# Patient Record
Sex: Female | Born: 1945 | Race: White | Hispanic: No | Marital: Married | Smoking: Never smoker
Health system: Southern US, Community
[De-identification: ages and names within clinical notes are randomized; demographics above are authoritative.]

## PROBLEM LIST (undated history)

## (undated) DIAGNOSIS — I10 Essential (primary) hypertension: Secondary | ICD-10-CM

## (undated) DIAGNOSIS — C9110 Chronic lymphocytic leukemia of B-cell type not having achieved remission: Secondary | ICD-10-CM

## (undated) DIAGNOSIS — G4730 Sleep apnea, unspecified: Secondary | ICD-10-CM

## (undated) DIAGNOSIS — E785 Hyperlipidemia, unspecified: Secondary | ICD-10-CM

## (undated) DIAGNOSIS — K8020 Calculus of gallbladder without cholecystitis without obstruction: Secondary | ICD-10-CM

## (undated) DIAGNOSIS — E669 Obesity, unspecified: Secondary | ICD-10-CM

## (undated) DIAGNOSIS — H269 Unspecified cataract: Secondary | ICD-10-CM

## (undated) DIAGNOSIS — IMO0001 Reserved for inherently not codable concepts without codable children: Secondary | ICD-10-CM

## (undated) DIAGNOSIS — H5461 Unqualified visual loss, right eye, normal vision left eye: Secondary | ICD-10-CM

## (undated) DIAGNOSIS — J329 Chronic sinusitis, unspecified: Secondary | ICD-10-CM

## (undated) DIAGNOSIS — D8689 Sarcoidosis of other sites: Secondary | ICD-10-CM

## (undated) DIAGNOSIS — M1990 Unspecified osteoarthritis, unspecified site: Secondary | ICD-10-CM

## (undated) DIAGNOSIS — T7840XA Allergy, unspecified, initial encounter: Secondary | ICD-10-CM

## (undated) DIAGNOSIS — K589 Irritable bowel syndrome without diarrhea: Secondary | ICD-10-CM

## (undated) DIAGNOSIS — H9192 Unspecified hearing loss, left ear: Secondary | ICD-10-CM

## (undated) HISTORY — PX: CHOLECYSTECTOMY: SHX55

## (undated) HISTORY — DX: Sarcoidosis of other sites: D86.89

## (undated) HISTORY — DX: Unqualified visual loss, right eye, normal vision left eye: H54.61

## (undated) HISTORY — DX: Allergy, unspecified, initial encounter: T78.40XA

## (undated) HISTORY — DX: Calculus of gallbladder without cholecystitis without obstruction: K80.20

## (undated) HISTORY — DX: Unspecified osteoarthritis, unspecified site: M19.90

## (undated) HISTORY — DX: Sleep apnea, unspecified: G47.30

## (undated) HISTORY — DX: Irritable bowel syndrome, unspecified: K58.9

## (undated) HISTORY — PX: BRAIN BIOPSY: SHX905

## (undated) HISTORY — DX: Essential (primary) hypertension: I10

## (undated) HISTORY — DX: Reserved for inherently not codable concepts without codable children: IMO0001

## (undated) HISTORY — DX: Unspecified hearing loss, left ear: H91.92

## (undated) HISTORY — PX: TONSILLECTOMY: SHX5217

## (undated) HISTORY — PX: CERVICAL FUSION: SHX112

## (undated) HISTORY — DX: Hyperlipidemia, unspecified: E78.5

## (undated) HISTORY — DX: Unspecified cataract: H26.9

## (undated) HISTORY — DX: Chronic sinusitis, unspecified: J32.9

## (undated) HISTORY — PX: COLONOSCOPY: SHX174

## (undated) HISTORY — DX: Obesity, unspecified: E66.9

## (undated) HISTORY — DX: Chronic lymphocytic leukemia of B-cell type not having achieved remission: C91.10

---

## 1999-09-22 ENCOUNTER — Encounter: Payer: Self-pay | Admitting: Pediatrics

## 1999-09-22 ENCOUNTER — Ambulatory Visit (HOSPITAL_COMMUNITY): Admission: RE | Admit: 1999-09-22 | Discharge: 1999-09-22 | Payer: Self-pay | Admitting: Pediatrics

## 2000-07-26 ENCOUNTER — Other Ambulatory Visit: Admission: RE | Admit: 2000-07-26 | Discharge: 2000-07-26 | Payer: Self-pay | Admitting: Internal Medicine

## 2000-07-31 ENCOUNTER — Encounter: Payer: Self-pay | Admitting: Internal Medicine

## 2000-07-31 ENCOUNTER — Encounter: Admission: RE | Admit: 2000-07-31 | Discharge: 2000-07-31 | Payer: Self-pay | Admitting: Internal Medicine

## 2002-01-15 ENCOUNTER — Other Ambulatory Visit: Admission: RE | Admit: 2002-01-15 | Discharge: 2002-01-15 | Payer: Self-pay | Admitting: Internal Medicine

## 2002-02-05 ENCOUNTER — Other Ambulatory Visit: Admission: RE | Admit: 2002-02-05 | Discharge: 2002-02-05 | Payer: Self-pay | Admitting: Oncology

## 2004-05-02 ENCOUNTER — Ambulatory Visit: Payer: Self-pay | Admitting: Oncology

## 2004-06-27 LAB — HM COLONOSCOPY

## 2004-08-24 ENCOUNTER — Ambulatory Visit: Payer: Self-pay | Admitting: Internal Medicine

## 2004-11-04 ENCOUNTER — Emergency Department (HOSPITAL_COMMUNITY): Admission: EM | Admit: 2004-11-04 | Discharge: 2004-11-05 | Payer: Self-pay | Admitting: Emergency Medicine

## 2004-11-09 ENCOUNTER — Ambulatory Visit: Payer: Self-pay | Admitting: Internal Medicine

## 2004-11-09 ENCOUNTER — Ambulatory Visit: Payer: Self-pay | Admitting: Oncology

## 2004-11-29 ENCOUNTER — Ambulatory Visit: Payer: Self-pay | Admitting: Internal Medicine

## 2004-12-08 ENCOUNTER — Encounter: Payer: Self-pay | Admitting: Internal Medicine

## 2004-12-08 ENCOUNTER — Ambulatory Visit: Payer: Self-pay | Admitting: Internal Medicine

## 2004-12-23 ENCOUNTER — Ambulatory Visit: Payer: Self-pay | Admitting: Internal Medicine

## 2005-05-18 ENCOUNTER — Ambulatory Visit: Payer: Self-pay | Admitting: Oncology

## 2005-06-06 ENCOUNTER — Ambulatory Visit: Payer: Self-pay | Admitting: Internal Medicine

## 2005-09-06 ENCOUNTER — Ambulatory Visit: Payer: Self-pay | Admitting: Internal Medicine

## 2005-11-03 ENCOUNTER — Ambulatory Visit: Payer: Self-pay | Admitting: Internal Medicine

## 2005-11-20 ENCOUNTER — Ambulatory Visit: Payer: Self-pay | Admitting: Oncology

## 2005-11-25 LAB — CBC WITH DIFFERENTIAL/PLATELET
BASO%: 0.2 % (ref 0.0–2.0)
Basophils Absolute: 0.1 10*3/uL (ref 0.0–0.1)
EOS%: 1 % (ref 0.0–7.0)
Eosinophils Absolute: 0.3 10*3/uL (ref 0.0–0.5)
HCT: 41.5 % (ref 34.8–46.6)
HGB: 14 g/dL (ref 11.6–15.9)
LYMPH%: 73 % — ABNORMAL HIGH (ref 14.0–48.0)
MCH: 28.9 pg (ref 26.0–34.0)
MCHC: 33.7 g/dL (ref 32.0–36.0)
MCV: 85.7 fL (ref 81.0–101.0)
MONO#: 0.7 10*3/uL (ref 0.1–0.9)
MONO%: 2.7 % (ref 0.0–13.0)
NEUT#: 6.3 10*3/uL (ref 1.5–6.5)
NEUT%: 23.1 % — ABNORMAL LOW (ref 39.6–76.8)
Platelets: 202 10*3/uL (ref 145–400)
RBC: 4.85 10*6/uL (ref 3.70–5.32)
RDW: 13.9 % (ref 11.3–14.5)
WBC: 27.3 10*3/uL — ABNORMAL HIGH (ref 3.9–10.0)
lymph#: 19.9 10*3/uL — ABNORMAL HIGH (ref 0.9–3.3)

## 2005-11-25 LAB — ERYTHROCYTE SEDIMENTATION RATE: Sed Rate: 13 mm/hr (ref 0–30)

## 2005-11-28 LAB — COMPREHENSIVE METABOLIC PANEL
ALT: 22 U/L (ref 0–40)
AST: 18 U/L (ref 0–37)
Albumin: 4.6 g/dL (ref 3.5–5.2)
Alkaline Phosphatase: 128 U/L — ABNORMAL HIGH (ref 39–117)
BUN: 19 mg/dL (ref 6–23)
CO2: 29 mEq/L (ref 19–32)
Calcium: 10 mg/dL (ref 8.4–10.5)
Chloride: 102 mEq/L (ref 96–112)
Creatinine, Ser: 0.88 mg/dL (ref 0.40–1.20)
Glucose, Bld: 94 mg/dL (ref 70–99)
Potassium: 4.7 mEq/L (ref 3.5–5.3)
Sodium: 141 mEq/L (ref 135–145)
Total Bilirubin: 0.4 mg/dL (ref 0.3–1.2)
Total Protein: 6.8 g/dL (ref 6.0–8.3)

## 2005-11-28 LAB — IGG, IGA, IGM
IgA: 116 mg/dL (ref 68–378)
IgG (Immunoglobin G), Serum: 496 mg/dL — ABNORMAL LOW (ref 694–1618)
IgM, Serum: 70 mg/dL (ref 60–263)

## 2005-11-28 LAB — LACTATE DEHYDROGENASE: LDH: 193 U/L (ref 94–250)

## 2005-11-28 LAB — BETA 2 MICROGLOBULIN, SERUM: Beta-2 Microglobulin: 1.77 mg/L — ABNORMAL HIGH (ref 1.01–1.73)

## 2005-12-07 ENCOUNTER — Ambulatory Visit: Payer: Self-pay | Admitting: Internal Medicine

## 2006-04-19 ENCOUNTER — Ambulatory Visit: Payer: Self-pay | Admitting: Internal Medicine

## 2006-05-19 ENCOUNTER — Ambulatory Visit: Payer: Self-pay | Admitting: Oncology

## 2006-05-24 LAB — LACTATE DEHYDROGENASE: LDH: 195 U/L (ref 94–250)

## 2006-05-24 LAB — CBC WITH DIFFERENTIAL/PLATELET
BASO%: 0.2 % (ref 0.0–2.0)
Basophils Absolute: 0 10*3/uL (ref 0.0–0.1)
EOS%: 1.5 % (ref 0.0–7.0)
Eosinophils Absolute: 0.4 10*3/uL (ref 0.0–0.5)
HCT: 41.4 % (ref 34.8–46.6)
HGB: 13.9 g/dL (ref 11.6–15.9)
LYMPH%: 74.4 % — ABNORMAL HIGH (ref 14.0–48.0)
MCH: 28.9 pg (ref 26.0–34.0)
MCHC: 33.5 g/dL (ref 32.0–36.0)
MCV: 86.3 fL (ref 81.0–101.0)
MONO#: 0.8 10*3/uL (ref 0.1–0.9)
MONO%: 2.7 % (ref 0.0–13.0)
NEUT#: 6.3 10*3/uL (ref 1.5–6.5)
NEUT%: 21.2 % — ABNORMAL LOW (ref 39.6–76.8)
Platelets: 270 10*3/uL (ref 145–400)
RBC: 4.79 10*6/uL (ref 3.70–5.32)
RDW: 14.2 % (ref 11.3–14.5)
WBC: 29.5 10*3/uL — ABNORMAL HIGH (ref 3.9–10.0)
lymph#: 22 10*3/uL — ABNORMAL HIGH (ref 0.9–3.3)

## 2006-05-24 LAB — COMPREHENSIVE METABOLIC PANEL
ALT: 25 U/L (ref 0–35)
AST: 17 U/L (ref 0–37)
Albumin: 4.3 g/dL (ref 3.5–5.2)
Alkaline Phosphatase: 125 U/L — ABNORMAL HIGH (ref 39–117)
BUN: 28 mg/dL — ABNORMAL HIGH (ref 6–23)
CO2: 26 mEq/L (ref 19–32)
Calcium: 9.4 mg/dL (ref 8.4–10.5)
Chloride: 103 mEq/L (ref 96–112)
Creatinine, Ser: 0.89 mg/dL (ref 0.40–1.20)
Glucose, Bld: 100 mg/dL — ABNORMAL HIGH (ref 70–99)
Potassium: 4.3 mEq/L (ref 3.5–5.3)
Sodium: 140 mEq/L (ref 135–145)
Total Bilirubin: 0.3 mg/dL (ref 0.3–1.2)
Total Protein: 6.8 g/dL (ref 6.0–8.3)

## 2006-05-24 LAB — IGG, IGA, IGM
IgA: 114 mg/dL (ref 68–378)
IgG (Immunoglobin G), Serum: 497 mg/dL — ABNORMAL LOW (ref 694–1618)
IgM, Serum: 63 mg/dL (ref 60–263)

## 2006-05-29 ENCOUNTER — Ambulatory Visit: Payer: Self-pay | Admitting: Internal Medicine

## 2006-05-29 LAB — CONVERTED CEMR LAB
ALT: 27 units/L (ref 0–40)
AST: 25 units/L (ref 0–37)
Albumin: 4.2 g/dL (ref 3.5–5.2)
Alkaline Phosphatase: 109 units/L (ref 39–117)
BUN: 23 mg/dL (ref 6–23)
Basophils Absolute: 0 10*3/uL (ref 0.0–0.1)
Basophils Relative: 0 % (ref 0.0–1.0)
CO2: 30 meq/L (ref 19–32)
Calcium: 9.7 mg/dL (ref 8.4–10.5)
Chloride: 105 meq/L (ref 96–112)
Chol/HDL Ratio, serum: 3.8
Cholesterol: 202 mg/dL (ref 0–200)
Creatinine, Ser: 1 mg/dL (ref 0.4–1.2)
Eosinophil percent: 0.7 % (ref 0.0–5.0)
GFR calc non Af Amer: 61 mL/min
Glomerular Filtration Rate, Af Am: 73 mL/min/{1.73_m2}
Glucose, Bld: 88 mg/dL (ref 70–99)
HCT: 42.8 % (ref 36.0–46.0)
HDL: 53.7 mg/dL (ref >39.0)
Hemoglobin: 14.3 g/dL (ref 12.0–15.0)
LDL DIRECT: 133 mg/dL
Lymphocytes Relative: 73.2 % — ABNORMAL HIGH (ref 12.0–46.0)
MCHC: 33.4 g/dL (ref 30.0–36.0)
MCV: 87.7 fL (ref 78.0–100.0)
Monocytes Absolute: 0.8 10*3/uL — ABNORMAL HIGH (ref 0.2–0.7)
Monocytes Relative: 3 % (ref 3.0–11.0)
Neutro Abs: 6.3 10*3/uL (ref 1.4–7.7)
Neutrophils Relative %: 23.1 % — ABNORMAL LOW (ref 43.0–77.0)
Platelets: 225 10*3/uL (ref 150–400)
Potassium: 3.8 meq/L (ref 3.5–5.1)
RBC: 4.89 M/uL (ref 3.87–5.11)
RDW: 13.6 % (ref 11.5–14.6)
Sodium: 142 meq/L (ref 135–145)
TSH: 1.57 microintl units/mL (ref 0.35–5.50)
Total Bilirubin: 0.6 mg/dL (ref 0.3–1.2)
Total Protein: 6.5 g/dL (ref 6.0–8.3)
Triglyceride fasting, serum: 135 mg/dL (ref 0–149)
VLDL: 27 mg/dL (ref 0–40)
WBC: 27.4 10*3/uL (ref 4.5–10.5)

## 2006-06-07 ENCOUNTER — Ambulatory Visit: Payer: Self-pay | Admitting: Internal Medicine

## 2006-10-02 ENCOUNTER — Encounter: Admission: RE | Admit: 2006-10-02 | Discharge: 2006-10-02 | Payer: Self-pay | Admitting: Internal Medicine

## 2006-10-02 ENCOUNTER — Encounter: Payer: Self-pay | Admitting: Internal Medicine

## 2006-11-21 ENCOUNTER — Ambulatory Visit: Payer: Self-pay | Admitting: Oncology

## 2006-11-23 LAB — CBC WITH DIFFERENTIAL/PLATELET
BASO%: 0.3 % (ref 0.0–2.0)
Basophils Absolute: 0.1 10*3/uL (ref 0.0–0.1)
EOS%: 0.5 % (ref 0.0–7.0)
Eosinophils Absolute: 0.2 10*3/uL (ref 0.0–0.5)
HCT: 41 % (ref 34.8–46.6)
HGB: 13.9 g/dL (ref 11.6–15.9)
LYMPH%: 76.8 % — ABNORMAL HIGH (ref 14.0–48.0)
MCH: 28.9 pg (ref 26.0–34.0)
MCHC: 34 g/dL (ref 32.0–36.0)
MCV: 84.9 fL (ref 81.0–101.0)
MONO#: 0.7 10*3/uL (ref 0.1–0.9)
MONO%: 2.2 % (ref 0.0–13.0)
NEUT#: 6.3 10*3/uL (ref 1.5–6.5)
NEUT%: 20.2 % — ABNORMAL LOW (ref 39.6–76.8)
Platelets: 252 10*3/uL (ref 145–400)
RBC: 4.83 10*6/uL (ref 3.70–5.32)
RDW: 13.8 % (ref 11.3–14.5)
WBC: 31.4 10*3/uL — ABNORMAL HIGH (ref 3.9–10.0)
lymph#: 24.1 10*3/uL — ABNORMAL HIGH (ref 0.9–3.3)

## 2006-11-23 LAB — ERYTHROCYTE SEDIMENTATION RATE: Sed Rate: 16 mm/hr (ref 0–30)

## 2006-11-27 LAB — PROTEIN ELECTROPHORESIS, SERUM
Albumin ELP: 60.1 % (ref 55.8–66.1)
Alpha-1-Globulin: 5.2 % — ABNORMAL HIGH (ref 2.9–4.9)
Alpha-2-Globulin: 14.9 % — ABNORMAL HIGH (ref 7.1–11.8)
Beta 2: 4.7 % (ref 3.2–6.5)
Beta Globulin: 6.8 % (ref 4.7–7.2)
Gamma Globulin: 8.3 % — ABNORMAL LOW (ref 11.1–18.8)
Total Protein, Serum Electrophoresis: 7.1 g/dL (ref 6.0–8.3)

## 2006-11-27 LAB — COMPREHENSIVE METABOLIC PANEL
ALT: 26 U/L (ref 0–35)
AST: 19 U/L (ref 0–37)
Albumin: 4.5 g/dL (ref 3.5–5.2)
Alkaline Phosphatase: 133 U/L — ABNORMAL HIGH (ref 39–117)
BUN: 23 mg/dL (ref 6–23)
CO2: 27 mEq/L (ref 19–32)
Calcium: 9.9 mg/dL (ref 8.4–10.5)
Chloride: 103 mEq/L (ref 96–112)
Creatinine, Ser: 0.88 mg/dL (ref 0.40–1.20)
Glucose, Bld: 99 mg/dL (ref 70–99)
Potassium: 4.2 mEq/L (ref 3.5–5.3)
Sodium: 143 mEq/L (ref 135–145)
Total Bilirubin: 0.4 mg/dL (ref 0.3–1.2)
Total Protein: 7.1 g/dL (ref 6.0–8.3)

## 2006-11-27 LAB — LACTATE DEHYDROGENASE: LDH: 226 U/L (ref 94–250)

## 2006-11-27 LAB — BETA 2 MICROGLOBULIN, SERUM: Beta-2 Microglobulin: 1.54 mg/L (ref 1.01–1.73)

## 2006-12-12 ENCOUNTER — Ambulatory Visit: Payer: Self-pay | Admitting: Internal Medicine

## 2006-12-14 ENCOUNTER — Encounter: Payer: Self-pay | Admitting: Internal Medicine

## 2006-12-14 DIAGNOSIS — E785 Hyperlipidemia, unspecified: Secondary | ICD-10-CM

## 2006-12-14 DIAGNOSIS — E669 Obesity, unspecified: Secondary | ICD-10-CM

## 2006-12-14 DIAGNOSIS — C9110 Chronic lymphocytic leukemia of B-cell type not having achieved remission: Secondary | ICD-10-CM

## 2006-12-14 DIAGNOSIS — M1990 Unspecified osteoarthritis, unspecified site: Secondary | ICD-10-CM

## 2006-12-14 DIAGNOSIS — I10 Essential (primary) hypertension: Secondary | ICD-10-CM | POA: Insufficient documentation

## 2006-12-14 DIAGNOSIS — IMO0001 Reserved for inherently not codable concepts without codable children: Secondary | ICD-10-CM | POA: Insufficient documentation

## 2006-12-14 HISTORY — DX: Reserved for inherently not codable concepts without codable children: IMO0001

## 2006-12-14 HISTORY — DX: Essential (primary) hypertension: I10

## 2006-12-14 HISTORY — DX: Obesity, unspecified: E66.9

## 2006-12-14 HISTORY — DX: Hyperlipidemia, unspecified: E78.5

## 2006-12-14 HISTORY — DX: Unspecified osteoarthritis, unspecified site: M19.90

## 2006-12-14 HISTORY — DX: Chronic lymphocytic leukemia of B-cell type not having achieved remission: C91.10

## 2007-05-29 ENCOUNTER — Encounter: Payer: Self-pay | Admitting: Internal Medicine

## 2007-06-01 ENCOUNTER — Encounter: Payer: Self-pay | Admitting: Internal Medicine

## 2007-06-12 ENCOUNTER — Ambulatory Visit: Payer: Self-pay | Admitting: Internal Medicine

## 2007-06-19 ENCOUNTER — Ambulatory Visit: Payer: Self-pay | Admitting: Oncology

## 2007-06-25 LAB — CBC & DIFF AND RETIC
BASO%: 0.3 % (ref 0.0–2.0)
Basophils Absolute: 0.1 10*3/uL (ref 0.0–0.1)
EOS%: 0.7 % (ref 0.0–7.0)
Eosinophils Absolute: 0.2 10*3/uL (ref 0.0–0.5)
HCT: 41 % (ref 34.8–46.6)
HGB: 13.9 g/dL (ref 11.6–15.9)
IRF: 0.26 (ref 0.130–0.330)
LYMPH%: 69.7 % — ABNORMAL HIGH (ref 14.0–48.0)
MCH: 28.5 pg (ref 26.0–34.0)
MCHC: 33.8 g/dL (ref 32.0–36.0)
MCV: 84.4 fL (ref 81.0–101.0)
MONO#: 0.9 10*3/uL (ref 0.1–0.9)
MONO%: 3 % (ref 0.0–13.0)
NEUT#: 7.4 10*3/uL — ABNORMAL HIGH (ref 1.5–6.5)
NEUT%: 26.3 % — ABNORMAL LOW (ref 39.6–76.8)
Platelets: 247 10*3/uL (ref 145–400)
RBC: 4.86 10*6/uL (ref 3.70–5.32)
RDW: 14.2 % (ref 11.3–14.5)
RETIC #: 51.5 10*3/uL (ref 19.7–115.1)
Retic %: 1.1 % (ref 0.4–2.3)
WBC: 28.4 10*3/uL — ABNORMAL HIGH (ref 3.9–10.0)
lymph#: 19.8 10*3/uL — ABNORMAL HIGH (ref 0.9–3.3)

## 2007-06-25 LAB — MORPHOLOGY: PLT EST: ADEQUATE

## 2007-06-25 LAB — CHCC SMEAR

## 2007-06-26 LAB — IGG, IGA, IGM
IgA: 116 mg/dL (ref 68–378)
IgG (Immunoglobin G), Serum: 522 mg/dL — ABNORMAL LOW (ref 694–1618)
IgM, Serum: 62 mg/dL (ref 60–263)

## 2007-06-26 LAB — COMPREHENSIVE METABOLIC PANEL
ALT: 18 U/L (ref 0–35)
AST: 18 U/L (ref 0–37)
Albumin: 4.6 g/dL (ref 3.5–5.2)
Alkaline Phosphatase: 133 U/L — ABNORMAL HIGH (ref 39–117)
BUN: 25 mg/dL — ABNORMAL HIGH (ref 6–23)
CO2: 25 mEq/L (ref 19–32)
Calcium: 10.3 mg/dL (ref 8.4–10.5)
Chloride: 102 mEq/L (ref 96–112)
Creatinine, Ser: 0.93 mg/dL (ref 0.40–1.20)
Glucose, Bld: 97 mg/dL (ref 70–99)
Potassium: 4.3 mEq/L (ref 3.5–5.3)
Sodium: 142 mEq/L (ref 135–145)
Total Bilirubin: 0.5 mg/dL (ref 0.3–1.2)
Total Protein: 7.1 g/dL (ref 6.0–8.3)

## 2007-06-26 LAB — BETA 2 MICROGLOBULIN, SERUM: Beta-2 Microglobulin: 1.98 mg/L — ABNORMAL HIGH (ref 1.01–1.73)

## 2007-06-26 LAB — LACTATE DEHYDROGENASE: LDH: 216 U/L (ref 94–250)

## 2007-06-29 ENCOUNTER — Ambulatory Visit: Payer: Self-pay | Admitting: Oncology

## 2007-06-29 ENCOUNTER — Encounter: Payer: Self-pay | Admitting: Internal Medicine

## 2007-08-28 ENCOUNTER — Encounter: Payer: Self-pay | Admitting: Internal Medicine

## 2007-10-01 ENCOUNTER — Telehealth: Payer: Self-pay | Admitting: Internal Medicine

## 2007-10-01 ENCOUNTER — Encounter: Payer: Self-pay | Admitting: Internal Medicine

## 2007-12-03 ENCOUNTER — Telehealth: Payer: Self-pay | Admitting: Internal Medicine

## 2007-12-10 ENCOUNTER — Ambulatory Visit: Payer: Self-pay | Admitting: Internal Medicine

## 2007-12-10 LAB — CONVERTED CEMR LAB
ALT: 31 units/L (ref 0–35)
AST: 27 units/L (ref 0–37)
Albumin: 4.1 g/dL (ref 3.5–5.2)
Alkaline Phosphatase: 104 units/L (ref 39–117)
BUN: 26 mg/dL — ABNORMAL HIGH (ref 6–23)
Basophils Absolute: 0.1 10*3/uL (ref 0.0–0.1)
Basophils Relative: 0.5 % (ref 0.0–1.0)
Bilirubin, Direct: 0.1 mg/dL (ref 0.0–0.3)
CO2: 29 meq/L (ref 19–32)
Calcium: 9.4 mg/dL (ref 8.4–10.5)
Chloride: 104 meq/L (ref 96–112)
Cholesterol: 213 mg/dL (ref 0–200)
Creatinine, Ser: 0.9 mg/dL (ref 0.4–1.2)
Direct LDL: 139.6 mg/dL
Eosinophils Absolute: 0.2 10*3/uL (ref 0.0–0.7)
Eosinophils Relative: 0.9 % (ref 0.0–5.0)
GFR calc Af Amer: 82 mL/min
GFR calc non Af Amer: 68 mL/min
Glucose, Bld: 86 mg/dL (ref 70–99)
HCT: 40.5 % (ref 36.0–46.0)
HDL: 49.1 mg/dL (ref >39.0)
Hemoglobin: 13.8 g/dL (ref 12.0–15.0)
Lymphocytes Relative: 75.7 % — ABNORMAL HIGH (ref 12.0–46.0)
MCHC: 34 g/dL (ref 30.0–36.0)
MCV: 86.7 fL (ref 78.0–100.0)
Monocytes Absolute: 0.8 10*3/uL (ref 0.1–1.0)
Monocytes Relative: 3.3 % (ref 3.0–12.0)
Neutro Abs: 4.8 10*3/uL (ref 1.4–7.7)
Neutrophils Relative %: 19.6 % — ABNORMAL LOW (ref 43.0–77.0)
Platelets: 180 10*3/uL (ref 150–400)
Potassium: 4 meq/L (ref 3.5–5.1)
RBC: 4.67 M/uL (ref 3.87–5.11)
RDW: 13.7 % (ref 11.5–14.6)
Sodium: 142 meq/L (ref 135–145)
TSH: 1.5 microintl units/mL (ref 0.35–5.50)
Total Bilirubin: 0.6 mg/dL (ref 0.3–1.2)
Total CHOL/HDL Ratio: 4.3
Total Protein: 6.9 g/dL (ref 6.0–8.3)
Triglycerides: 152 mg/dL — ABNORMAL HIGH (ref 0–149)
VLDL: 30 mg/dL (ref 0–40)
WBC: 24.1 10*3/uL (ref 4.5–10.5)

## 2007-12-17 ENCOUNTER — Ambulatory Visit: Payer: Self-pay | Admitting: Internal Medicine

## 2007-12-25 ENCOUNTER — Ambulatory Visit: Payer: Self-pay | Admitting: Oncology

## 2007-12-27 LAB — CBC & DIFF AND RETIC
BASO%: 0.3 % (ref 0.0–2.0)
Basophils Absolute: 0.1 10*3/uL (ref 0.0–0.1)
EOS%: 0.9 % (ref 0.0–7.0)
Eosinophils Absolute: 0.2 10*3/uL (ref 0.0–0.5)
HCT: 42.2 % (ref 34.8–46.6)
HGB: 14.3 g/dL (ref 11.6–15.9)
IRF: 0.27 (ref 0.130–0.330)
LYMPH%: 72.7 % — ABNORMAL HIGH (ref 14.0–48.0)
MCH: 28.8 pg (ref 26.0–34.0)
MCHC: 33.9 g/dL (ref 32.0–36.0)
MCV: 85 fL (ref 81.0–101.0)
MONO#: 0.4 10*3/uL (ref 0.1–0.9)
MONO%: 1.9 % (ref 0.0–13.0)
NEUT#: 5.5 10*3/uL (ref 1.5–6.5)
NEUT%: 24.2 % — ABNORMAL LOW (ref 39.6–76.8)
Platelets: 193 10*3/uL (ref 145–400)
RBC: 4.97 10*6/uL (ref 3.70–5.32)
RDW: 14.3 % (ref 11.3–14.5)
RETIC #: 38.3 10*3/uL (ref 19.7–115.1)
Retic %: 0.8 % (ref 0.4–2.3)
WBC: 22.6 10*3/uL — ABNORMAL HIGH (ref 3.9–10.0)
lymph#: 16.4 10*3/uL — ABNORMAL HIGH (ref 0.9–3.3)

## 2007-12-27 LAB — IGG, IGA, IGM
IgA: 109 mg/dL (ref 68–378)
IgG (Immunoglobin G), Serum: 538 mg/dL — ABNORMAL LOW (ref 694–1618)
IgM, Serum: 56 mg/dL — ABNORMAL LOW (ref 60–263)

## 2007-12-27 LAB — MORPHOLOGY
PLT EST: ADEQUATE
RBC Comments: NORMAL

## 2007-12-27 LAB — COMPREHENSIVE METABOLIC PANEL
ALT: 24 U/L (ref 0–35)
AST: 21 U/L (ref 0–37)
Albumin: 4.7 g/dL (ref 3.5–5.2)
Alkaline Phosphatase: 115 U/L (ref 39–117)
BUN: 24 mg/dL — ABNORMAL HIGH (ref 6–23)
CO2: 25 mEq/L (ref 19–32)
Calcium: 10 mg/dL (ref 8.4–10.5)
Chloride: 105 mEq/L (ref 96–112)
Creatinine, Ser: 1.01 mg/dL (ref 0.40–1.20)
Glucose, Bld: 96 mg/dL (ref 70–99)
Potassium: 4.6 mEq/L (ref 3.5–5.3)
Sodium: 143 mEq/L (ref 135–145)
Total Bilirubin: 0.3 mg/dL (ref 0.3–1.2)
Total Protein: 7.2 g/dL (ref 6.0–8.3)

## 2007-12-27 LAB — ERYTHROCYTE SEDIMENTATION RATE: Sed Rate: 16 mm/hr (ref 0–30)

## 2007-12-27 LAB — LACTATE DEHYDROGENASE: LDH: 247 U/L (ref 94–250)

## 2007-12-27 LAB — CHCC SMEAR

## 2008-01-02 ENCOUNTER — Telehealth: Payer: Self-pay | Admitting: Internal Medicine

## 2008-01-03 ENCOUNTER — Encounter: Payer: Self-pay | Admitting: Internal Medicine

## 2008-01-04 ENCOUNTER — Telehealth: Payer: Self-pay | Admitting: Internal Medicine

## 2008-04-01 ENCOUNTER — Encounter: Payer: Self-pay | Admitting: Internal Medicine

## 2008-04-03 ENCOUNTER — Ambulatory Visit: Payer: Self-pay | Admitting: Internal Medicine

## 2008-04-21 ENCOUNTER — Ambulatory Visit: Payer: Self-pay | Admitting: Internal Medicine

## 2008-07-29 ENCOUNTER — Ambulatory Visit: Payer: Self-pay | Admitting: Oncology

## 2008-07-31 LAB — CBC & DIFF AND RETIC
BASO%: 0.2 % (ref 0.0–2.0)
Basophils Absolute: 0 10*3/uL (ref 0.0–0.1)
EOS%: 0.7 % (ref 0.0–7.0)
Eosinophils Absolute: 0.2 10*3/uL (ref 0.0–0.5)
HCT: 39.3 % (ref 34.8–46.6)
HGB: 13.2 g/dL (ref 11.6–15.9)
IRF: 0.4 — ABNORMAL HIGH (ref 0.130–0.330)
LYMPH%: 76 % — ABNORMAL HIGH (ref 14.0–48.0)
MCH: 29.3 pg (ref 26.0–34.0)
MCHC: 33.5 g/dL (ref 32.0–36.0)
MCV: 87.5 fL (ref 81.0–101.0)
MONO#: 0.7 10*3/uL (ref 0.1–0.9)
MONO%: 2.8 % (ref 0.0–13.0)
NEUT#: 5.4 10*3/uL (ref 1.5–6.5)
NEUT%: 20.3 % — ABNORMAL LOW (ref 39.6–76.8)
Platelets: 209 10*3/uL (ref 145–400)
RBC: 4.49 10*6/uL (ref 3.70–5.32)
RDW: 14.2 % (ref 11.3–14.5)
RETIC #: 81.3 10*3/uL (ref 19.7–115.1)
Retic %: 1.8 % (ref 0.4–2.3)
WBC: 26.4 10*3/uL — ABNORMAL HIGH (ref 3.9–10.0)
lymph#: 20.1 10*3/uL — ABNORMAL HIGH (ref 0.9–3.3)

## 2008-07-31 LAB — ERYTHROCYTE SEDIMENTATION RATE: Sed Rate: 14 mm/hr (ref 0–30)

## 2008-07-31 LAB — MORPHOLOGY: PLT EST: ADEQUATE

## 2008-07-31 LAB — COMPREHENSIVE METABOLIC PANEL
ALT: 27 U/L (ref 0–35)
AST: 22 U/L (ref 0–37)
Albumin: 4.6 g/dL (ref 3.5–5.2)
Alkaline Phosphatase: 104 U/L (ref 39–117)
BUN: 20 mg/dL (ref 6–23)
CO2: 27 mEq/L (ref 19–32)
Calcium: 9.7 mg/dL (ref 8.4–10.5)
Chloride: 104 mEq/L (ref 96–112)
Creatinine, Ser: 0.92 mg/dL (ref 0.40–1.20)
Glucose, Bld: 99 mg/dL (ref 70–99)
Potassium: 4 mEq/L (ref 3.5–5.3)
Sodium: 142 mEq/L (ref 135–145)
Total Bilirubin: 0.3 mg/dL (ref 0.3–1.2)
Total Protein: 6.5 g/dL (ref 6.0–8.3)

## 2008-07-31 LAB — LACTATE DEHYDROGENASE: LDH: 262 U/L — ABNORMAL HIGH (ref 94–250)

## 2008-07-31 LAB — CHCC SMEAR

## 2008-08-14 ENCOUNTER — Encounter: Payer: Self-pay | Admitting: Internal Medicine

## 2008-09-29 ENCOUNTER — Telehealth: Payer: Self-pay | Admitting: Internal Medicine

## 2008-10-01 ENCOUNTER — Telehealth (INDEPENDENT_AMBULATORY_CARE_PROVIDER_SITE_OTHER): Payer: Self-pay

## 2008-10-21 ENCOUNTER — Ambulatory Visit: Payer: Self-pay | Admitting: Internal Medicine

## 2008-12-10 ENCOUNTER — Telehealth: Payer: Self-pay | Admitting: Internal Medicine

## 2009-02-02 ENCOUNTER — Ambulatory Visit: Payer: Self-pay | Admitting: Oncology

## 2009-02-04 LAB — CBC & DIFF AND RETIC
BASO%: 0.2 % (ref 0.0–2.0)
Basophils Absolute: 0.1 10*3/uL (ref 0.0–0.1)
EOS%: 0.7 % (ref 0.0–7.0)
Eosinophils Absolute: 0.2 10*3/uL (ref 0.0–0.5)
HCT: 41.9 % (ref 34.8–46.6)
HGB: 13.8 g/dL (ref 11.6–15.9)
Immature Retic Fract: 3.8 % (ref 0.00–10.70)
LYMPH%: 71.5 % — ABNORMAL HIGH (ref 14.0–49.7)
MCH: 28.6 pg (ref 25.1–34.0)
MCHC: 32.9 g/dL (ref 31.5–36.0)
MCV: 86.7 fL (ref 79.5–101.0)
MONO#: 0.7 10*3/uL (ref 0.1–0.9)
MONO%: 2.2 % (ref 0.0–14.0)
NEUT#: 7.9 10*3/uL — ABNORMAL HIGH (ref 1.5–6.5)
NEUT%: 25.4 % — ABNORMAL LOW (ref 38.4–76.8)
Platelets: 215 10*3/uL (ref 145–400)
RBC: 4.83 10*6/uL (ref 3.70–5.45)
RDW: 14 % (ref 11.2–14.5)
Retic %: 0.67 % (ref 0.50–1.50)
Retic Ct Abs: 32.36 10*3/uL (ref 18.30–72.70)
WBC: 31 10*3/uL — ABNORMAL HIGH (ref 3.9–10.3)
lymph#: 22.2 10*3/uL — ABNORMAL HIGH (ref 0.9–3.3)

## 2009-02-04 LAB — COMPREHENSIVE METABOLIC PANEL
ALT: 23 U/L (ref 0–35)
AST: 22 U/L (ref 0–37)
Albumin: 4.7 g/dL (ref 3.5–5.2)
Alkaline Phosphatase: 121 U/L — ABNORMAL HIGH (ref 39–117)
BUN: 33 mg/dL — ABNORMAL HIGH (ref 6–23)
CO2: 24 mEq/L (ref 19–32)
Calcium: 10.8 mg/dL — ABNORMAL HIGH (ref 8.4–10.5)
Chloride: 103 mEq/L (ref 96–112)
Creatinine, Ser: 1.13 mg/dL (ref 0.40–1.20)
Glucose, Bld: 98 mg/dL (ref 70–99)
Potassium: 4.2 mEq/L (ref 3.5–5.3)
Sodium: 142 mEq/L (ref 135–145)
Total Bilirubin: 0.3 mg/dL (ref 0.3–1.2)
Total Protein: 7.3 g/dL (ref 6.0–8.3)

## 2009-02-04 LAB — LACTATE DEHYDROGENASE: LDH: 225 U/L (ref 94–250)

## 2009-02-04 LAB — TECHNOLOGIST REVIEW

## 2009-02-04 LAB — CHCC SMEAR

## 2009-02-11 ENCOUNTER — Encounter: Payer: Self-pay | Admitting: Internal Medicine

## 2009-03-23 ENCOUNTER — Ambulatory Visit: Payer: Self-pay | Admitting: Internal Medicine

## 2009-03-23 DIAGNOSIS — M549 Dorsalgia, unspecified: Secondary | ICD-10-CM | POA: Insufficient documentation

## 2009-04-22 ENCOUNTER — Ambulatory Visit: Payer: Self-pay | Admitting: Internal Medicine

## 2009-06-01 ENCOUNTER — Ambulatory Visit: Payer: Self-pay | Admitting: Internal Medicine

## 2009-06-01 DIAGNOSIS — J069 Acute upper respiratory infection, unspecified: Secondary | ICD-10-CM | POA: Insufficient documentation

## 2009-08-05 ENCOUNTER — Ambulatory Visit: Payer: Self-pay | Admitting: Oncology

## 2009-08-05 LAB — CBC & DIFF AND RETIC
BASO%: 0.2 % (ref 0.0–2.0)
Basophils Absolute: 0.1 10*3/uL (ref 0.0–0.1)
EOS%: 1.1 % (ref 0.0–7.0)
Eosinophils Absolute: 0.3 10*3/uL (ref 0.0–0.5)
HCT: 42 % (ref 34.8–46.6)
HGB: 13.6 g/dL (ref 11.6–15.9)
Immature Retic Fract: 5.6 % (ref 0.00–10.70)
LYMPH%: 72.3 % — ABNORMAL HIGH (ref 14.0–49.7)
MCH: 28.6 pg (ref 25.1–34.0)
MCHC: 32.4 g/dL (ref 31.5–36.0)
MCV: 88.4 fL (ref 79.5–101.0)
MONO#: 0.7 10*3/uL (ref 0.1–0.9)
MONO%: 2.7 % (ref 0.0–14.0)
NEUT#: 5.8 10*3/uL (ref 1.5–6.5)
NEUT%: 23.7 % — ABNORMAL LOW (ref 38.4–76.8)
Platelets: 228 10*3/uL (ref 145–400)
RBC: 4.75 10*6/uL (ref 3.70–5.45)
RDW: 14.5 % (ref 11.2–14.5)
Retic %: 0.99 % (ref 0.50–1.50)
Retic Ct Abs: 47.03 10*3/uL (ref 18.30–72.70)
WBC: 24.4 10*3/uL — ABNORMAL HIGH (ref 3.9–10.3)
lymph#: 17.7 10*3/uL — ABNORMAL HIGH (ref 0.9–3.3)

## 2009-08-05 LAB — COMPREHENSIVE METABOLIC PANEL
ALT: 29 U/L (ref 0–35)
AST: 22 U/L (ref 0–37)
Albumin: 4.4 g/dL (ref 3.5–5.2)
Alkaline Phosphatase: 110 U/L (ref 39–117)
BUN: 24 mg/dL — ABNORMAL HIGH (ref 6–23)
CO2: 27 mEq/L (ref 19–32)
Calcium: 9.2 mg/dL (ref 8.4–10.5)
Chloride: 103 mEq/L (ref 96–112)
Creatinine, Ser: 1.12 mg/dL (ref 0.40–1.20)
Glucose, Bld: 90 mg/dL (ref 70–99)
Potassium: 4.2 mEq/L (ref 3.5–5.3)
Sodium: 142 mEq/L (ref 135–145)
Total Bilirubin: 0.3 mg/dL (ref 0.3–1.2)
Total Protein: 6.6 g/dL (ref 6.0–8.3)

## 2009-08-05 LAB — LACTATE DEHYDROGENASE: LDH: 196 U/L (ref 94–250)

## 2009-08-05 LAB — TECHNOLOGIST REVIEW

## 2009-08-05 LAB — CHCC SMEAR

## 2009-08-12 ENCOUNTER — Encounter: Payer: Self-pay | Admitting: Internal Medicine

## 2009-10-06 ENCOUNTER — Encounter: Payer: Self-pay | Admitting: Internal Medicine

## 2009-10-21 ENCOUNTER — Ambulatory Visit: Payer: Self-pay | Admitting: Internal Medicine

## 2010-01-22 DIAGNOSIS — T2120XA Burn of second degree of trunk, unspecified site, initial encounter: Secondary | ICD-10-CM | POA: Insufficient documentation

## 2010-02-05 ENCOUNTER — Ambulatory Visit: Payer: Self-pay | Admitting: Family Medicine

## 2010-02-08 ENCOUNTER — Ambulatory Visit: Payer: Self-pay | Admitting: Oncology

## 2010-02-10 LAB — CHCC SMEAR

## 2010-02-10 LAB — CBC & DIFF AND RETIC
BASO%: 0.2 % (ref 0.0–2.0)
Basophils Absolute: 0.1 10*3/uL (ref 0.0–0.1)
EOS%: 1.5 % (ref 0.0–7.0)
Eosinophils Absolute: 0.4 10*3/uL (ref 0.0–0.5)
HCT: 43.3 % (ref 34.8–46.6)
HGB: 14.3 g/dL (ref 11.6–15.9)
Immature Retic Fract: 2.9 % (ref 0.00–10.70)
LYMPH%: 74.3 % — ABNORMAL HIGH (ref 14.0–49.7)
MCH: 29.2 pg (ref 25.1–34.0)
MCHC: 33 g/dL (ref 31.5–36.0)
MCV: 88.4 fL (ref 79.5–101.0)
MONO#: 0.6 10*3/uL (ref 0.1–0.9)
MONO%: 2.1 % (ref 0.0–14.0)
NEUT#: 6 10*3/uL (ref 1.5–6.5)
NEUT%: 21.9 % — ABNORMAL LOW (ref 38.4–76.8)
Platelets: 239 10*3/uL (ref 145–400)
RBC: 4.9 10*6/uL (ref 3.70–5.45)
RDW: 14.2 % (ref 11.2–14.5)
Retic %: 0.81 % (ref 0.50–1.50)
Retic Ct Abs: 39.69 10*3/uL (ref 18.30–72.70)
WBC: 27.3 10*3/uL — ABNORMAL HIGH (ref 3.9–10.3)
lymph#: 20.3 10*3/uL — ABNORMAL HIGH (ref 0.9–3.3)

## 2010-02-10 LAB — MORPHOLOGY
PLT EST: ADEQUATE
RBC Comments: NORMAL

## 2010-02-16 ENCOUNTER — Ambulatory Visit: Payer: Self-pay | Admitting: Internal Medicine

## 2010-02-16 LAB — CONVERTED CEMR LAB
AST: 26 units/L (ref 0–37)
Cholesterol: 263 mg/dL — ABNORMAL HIGH (ref 0–200)
Direct LDL: 169.8 mg/dL
HDL: 56.2 mg/dL (ref >39.00)
Total CHOL/HDL Ratio: 5
Triglycerides: 260 mg/dL — ABNORMAL HIGH (ref 0.0–149.0)
VLDL: 52 mg/dL — ABNORMAL HIGH (ref 0.0–40.0)

## 2010-04-06 ENCOUNTER — Encounter: Payer: Self-pay | Admitting: Internal Medicine

## 2010-07-27 NOTE — Letter (Signed)
[facility]   Imported By: Maryln Gottron 10/08/2009 13:33:26  _____________________________________________________________________  External Attachment:    Type:   Image     Comment:   External Document
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
## 2010-07-27 NOTE — Consult Note (Signed)
Summary: Rheumatology-Dr. Kellie Simmering  Rheumatology-Dr. Kellie Simmering   Imported By: Maryln Gottron 12/06/2007 14:42:17  _____________________________________________________________________  External Attachment:    Type:   Image     Comment:   External Document

## 2010-07-27 NOTE — Progress Notes (Signed)
Summary: needs Dr Kirtland Bouchard to refill an rx from another Dr   Phone Note Call from Patient Call back at Home Phone [phone]   Caller: pt vm triage Call For: K Summary of Call: gets an rx from another doctor.  That Dr is out of the country till 6-22.  It is for an antiinflammatory. She will run out this would and would like Dr K to refill  Initial call taken by: Roselle Locus,  December 03, 2007 2:01 PM  Follow-up for Phone Call        Pt is taking Nabumetone 500 mg. 2 daily from Dr. Kellie Simmering and he is out of the country.  His office suggest that her Primary Care MD fill this .  Would you do that? Follow-up by: Lynann Beaver CMA,  December 03, 2007 2:07 PM    OK to rf celebrex 200 mg  #100 one twice daily     Appended Document: needs Dr Kirtland Bouchard to refill an rx from another Dr  Pt is taking Nabumetone 500 mg. 2 daily.  Do you want to refuse this and prescribe Celebrex 200 mg????  Appended Document: needs Dr Kirtland Bouchard to refill an rx from another Dr      Clinical Lists Changes  Medications: Rx of CELEBREX 200 MG CAPS (CELECOXIB) Take 1 capsule by mouth twice a day;  #100 x 0;  Signed;  Entered by: Sid Falcon LPN;  Authorized by: Gordy Savers  MD;  Method used: Electronic    Prescriptions: CELEBREX 200 MG CAPS (CELECOXIB) Take 1 capsule by mouth twice a day  #100 x 0   Entered by:   Sid Falcon LPN   Authorized by:   Gordy Savers  MD   Signed by:   Sid Falcon LPN on 09/81/1914   Method used:   Electron[facility] to ...       [facility]  [id number]       [address]       [place], [place]  [address]       Ph: [phone] or [phone]       Fax: [phone]   RxID:   [id number]  Rx sent electronically, pt informed Sid Falcon LPN  December 05, 2007 9:16 AM

## 2010-07-27 NOTE — Progress Notes (Signed)
Summary: change meds  Phone Note Call from Patient   Caller: Patient Call For: Dr. Kirtland Bouchard Summary of Call: Pt was taken off Hyomax SR .375 mg. and put on Symax Duotab at her last office visit and the Symax is too expensive.  Please call pharmacy and reorder Hyomax SR 0.357m. one two times a day. [facility] [address]  Initial call taken by: Lynann Beaver CMA,  January 02, 2008 11:07 AM  Follow-up for Phone Call        OK Follow-up by: Gordy Savers  MD,  January 02, 2008 12:38 PM
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
## 2010-07-27 NOTE — Assessment & Plan Note (Signed)
Summary: 4 month rov/njr   Vital Signs:  Patient Profile:   65 Years Old Female Weight:      213 pounds Temp:     98.2 degrees F BP sitting:   166 / 86  (left arm) Cuff size:   large  Vitals Entered By: Raechel Ache, RN (April 21, 2008 10:17 AM)                 Chief Complaint:  ROV.Marland Kitchen  History of Present Illness: 65 year-old female seen for follow-up of her hypertension;  DJD and fibromyalgia.  She is followed by  rheumatology for fibromyalgia,  oncology for CLL and also closely by [facility]mology.  she is do well.  Today.  Denies any cardiopulmonary complaints.  She also has overactive bladder control on VESIcare    Current Allergies: No known allergies   Past Medical History:    Reviewed history from 12/14/2006 and no changes required:       Hyperlipidemia       Hypertension       Lymphocytic leukemia       CNS sarcoid w/secondary gait disturbance, partial deafness       Fibromyalgia       Obesity       DJD   Family History:    Reviewed history from 12/17/2007 and no changes required:       father died age 68.  History of COPD, dementia       mother history of hypertension , osteoarthritis       One sister in good health  Social History:    Reviewed history from 12/17/2007 and no changes required:       Married       Regular exercise-no       no children    Review of Systems  The patient denies anorexia, fever, weight loss, weight gain, vision loss, decreased hearing, hoarseness, chest pain, syncope, dyspnea on exertion, peripheral edema, prolonged cough, headaches, hemoptysis, abdominal pain, melena, hematochezia, severe indigestion/heartburn, hematuria, incontinence, genital sores, muscle weakness, suspicious skin lesions, transient blindness, difficulty walking, depression, unusual weight change, abnormal bleeding, enlarged lymph nodes, angioedema, and breast masses.     Physical Exam  General:     overweight-appearing.   Head:  Normocephalic and atraumatic without obvious abnormalities. No apparent alopecia or balding. Eyes:     No corneal or conjunctival inflammation noted. EOMI. Perrla. Funduscopic exam benign, without hemorrhages, exudates or papilledema. Vision grossly normal. Mouth:     Oral mucosa and oropharynx without lesions or exudates.  Teeth in good repair. Neck:     No deformities, masses, or tenderness noted. Lungs:     Normal respiratory effort, chest expands symmetrically. Lungs are clear to auscultation, no crackles or wheezes. Heart:     Normal rate and regular rhythm. S1 and S2 normal without gallop, murmur, click, rub or other extra sounds. Abdomen:     Bowel sounds positive,abdomen soft and non-tender without masses, organomegaly or hernias noted.    Impression & Recommendations:  Problem # 1:  FIBROMYALGIA (ICD-729.1)  Her updated medication list for this problem includes:    Aspirin 81 Mg Tbec (Aspirin) .Marland Kitchen... Take 1 tablet by mouth once a day    Cyclobenzaprine Hcl 10 Mg Tabs (Cyclobenzaprine hcl) .Marland Kitchen... Take 1 tablet by mouth three times a day    Tramadol Hcl 50 Mg Tabs (Tramadol hcl) .Marland Kitchen... 1 q6h as needed    Nabumetone 500 Mg Tabs (Nabumetone) .Marland Kitchen... 1 two  times a day   Problem # 2:  HYPERTENSION (ICD-401.9)  Her updated medication list for this problem includes:    Hydrochlorothiazide 25 Mg Tabs (Hydrochlorothiazide) .Marland Kitchen... 1 once daily    Lisinopril 40 Mg Tabs (Lisinopril) .Marland Kitchen... Take 1 tablet by mouth once a day   Complete Medication List: 1)  Aspirin 81 Mg Tbec (Aspirin) .... Take 1 tablet by mouth once a day 2)  Cyclobenzaprine Hcl 10 Mg Tabs (Cyclobenzaprine hcl) .... Take 1 tablet by mouth three times a day 3)  Flonase 50 Mcg/act Susp (Fluticasone propionate) .... Spray 2 spray into both nostrils once a day 4)  Hydrochlorothiazide 25 Mg Tabs (Hydrochlorothiazide) .Marland Kitchen.. 1 once daily 5)  Lisinopril 40 Mg Tabs (Lisinopril) .... Take 1 tablet by mouth once a day 6)  Prilosec 20  Mg Cpdr (Omeprazole) .... Take 1 capsule by mouth once a day 7)  Vesicare 5 Mg Tabs (Solifenacin succinate) .... Take 1 tablet by mouth once a day 8)  Zyrtec Allergy 10 Mg Tabs (Cetirizine hcl) .Marland Kitchen.. 1 once daily as needed 9)  Tramadol Hcl 50 Mg Tabs (Tramadol hcl) .Marland Kitchen.. 1 q6h as needed 10)  Nabumetone 500 Mg Tabs (Nabumetone) .Marland Kitchen.. 1 two times a day 11)  Hyomax-sr 0.375 Mg Tb12 (Hyoscyamine sulfate) .... One tablet two times a day 12)  Pravastatin Sodium 40 Mg Tabs (Pravastatin sodium) .Marland Kitchen.. 1 once daily   Patient Instructions: 1)  Please schedule a follow-up appointment in 6 months. 2)  Limit your Sodium (Salt) to less than 2 grams a day(slightly less than 1/2 a teaspoon) to prevent fluid retention, swelling, or worsening of symptoms. 3)  It is important that you exercise regularly at least 20 minutes 5 times a week. If you develop chest pain, have severe difficulty breathing, or feel very tired , stop exercising immediately and seek medical attention. 4)  You need to lose weight. Consider a lower calorie diet and regular exercise.    Prescriptions: PRAVASTATIN SODIUM 40 MG TABS (PRAVASTATIN SODIUM) 1 once daily  #90 x 6   Entered and Authorized by:   Gordy Savers  MD   Signed by:   Gordy Savers  MD on 04/21/2008   Method used:   Print then Give to Patient   RxID:   [id number] HYOMAX-SR 0.375 MG  TB12 (HYOSCYAMINE SULFATE) one tablet two times a day  #100 x 1   Entered and Authorized by:   Gordy Savers  MD   Signed by:   Gordy Savers  MD on 04/21/2008   Method used:   Print then Give to Patient   RxID:   [id number] TRAMADOL HCL 50 MG  TABS (TRAMADOL HCL) 1 q6h as needed  #180 x 6   Entered and Authorized by:   Gordy Savers  MD   Signed by:   Gordy Savers  MD on 04/21/2008   Method used:   Print then Give to Patient   RxID:   [id number] ZYRTEC ALLERGY 10 MG  TABS (CETIRIZINE HCL) 1 once daily as needed  #90 x 6   Entered and  Authorized by:   Gordy Savers  MD   Signed by:   Gordy Savers  MD on 04/21/2008   Method used:   Print then Give to Patient   RxID:   [id number] VESICARE 5 MG TABS (SOLIFENACIN SUCCINATE) Take 1 tablet by mouth once a day  #90 x 6   Entered and Authorized by:   Theron Arista  Lysle Dingwall  MD   Signed by:   Gordy Savers  MD on 04/21/2008   Method used:   Print then Give to Patient   RxID:   [id number] PRILOSEC 20 MG CPDR (OMEPRAZOLE) Take 1 capsule by mouth once a day  #90 x 6   Entered and Authorized by:   Gordy Savers  MD   Signed by:   Gordy Savers  MD on 04/21/2008   Method used:   Print then Give to Patient   RxID:   [id number] LISINOPRIL 40 MG TABS (LISINOPRIL) Take 1 tablet by mouth once a day  #90 x 6   Entered and Authorized by:   Gordy Savers  MD   Signed by:   Gordy Savers  MD on 04/21/2008   Method used:   Print then Give to Patient   RxID:   [id number] HYDROCHLOROTHIAZIDE 25 MG TABS (HYDROCHLOROTHIAZIDE) 1 once daily  #90 x 6   Entered and Authorized by:   Gordy Savers  MD   Signed by:   Gordy Savers  MD on 04/21/2008   Method used:   Print then Give to Patient   RxID:   [id number] FLONASE 50 MCG/ACT SUSP (FLUTICASONE PROPIONATE) Spray 2 spray into both nostrils once a day  #3 x 6   Entered and Authorized by:   Gordy Savers  MD   Signed by:   Gordy Savers  MD on 04/21/2008   Method used:   Print then Give to Patient   RxID:   [id number] CYCLOBENZAPRINE HCL 10 MG TABS (CYCLOBENZAPRINE HCL) Take 1 tablet by mouth three times a day  #180 x 6   Entered and Authorized by:   Gordy Savers  MD   Signed by:   Gordy Savers  MD on 04/21/2008   Method used:   Print then Give to Patient   RxID:   [id number]  ]

## 2010-07-27 NOTE — Letter (Signed)
Summary: Rheumatology-Dr. Stacey Drain  Rheumatology-Dr. Stacey Drain   Imported By: Maryln Gottron 04/15/2010 10:40:03  _____________________________________________________________________  External Attachment:    Type:   Image     Comment:   External Document

## 2010-07-27 NOTE — Progress Notes (Signed)
Summary: Pravastatin question  Phone Note Call from Patient Call back at Home Phone [phone]   Caller: Patient Call For: Amador Cunas Summary of Call: Pt Pravastatin was increased to 45m mg on 6/22.  Pt has been experiencing bil shoulder, arms and hand aching pain, L > R.  Pt is relating it to the increaase from 40 mg to 80.  Pt has a F/U visit scheduled for Oct 26 and has enough 40 mg tabs left to stay on the 40 mg until next OV. Is this OK with Dr Amador Cunas? Initial call taken by: Sid Falcon LPN,  January 04, 2008 9:00 AM  Follow-up for Phone Call        OK Follow-up by: Gordy Savers  MD,  January 04, 2008 12:20 PM         Appended Document: Pravastatin question Pt informed

## 2010-07-27 NOTE — Assessment & Plan Note (Signed)
Summary: swelling around vertabrae upper and middle back/cjr   Vital Signs:  Patient profile:   65 year old female Weight:      210 pounds BMI:     41.16 Temp:     97.6 degrees F oral BP sitting:   130 / 64  (left arm) Cuff size:   large  Vitals Entered By: Raechel Ache, RN (March 23, 2009 10:28 AM) CC: C/o swelling mid-back x 4-5 days; sore. Flu Vaccine Consent Questions     Do you have a history of severe allergic reactions to this vaccine? no    Any prior history of allergic reactions to egg and/or gelatin? no    Do you have a sensitivity to the preservative Thimersol? no    Do you have a past history of Guillan-Barre Syndrome? no    Do you currently have an acute febrile illness? no    Have you ever had a severe reaction to latex? no    Vaccine information given and explained to patient? yes    Are you currently pregnant? no    Lot Number:[id number]   Exp Date:12/24/2009   Site Given  Left Deltoid IM   CC:  C/o swelling mid-back x 4-5 days; sore.Marland Kitchen  History of Present Illness: 65 year old patient who has a history of DJD and fibromyalgia.  She has a 4 to 5-day history of pain in the right mid back area.  She has noticed some soft tissue swelling in this area and some associated nausea.  Pain is aggravated by movement such as twisting.  She has treated hypertension and dyslipidemia  Allergies: No Known Drug Allergies  Past History:  Past Medical History: Reviewed history from 12/14/2006 and no changes required. 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  nblood pressureoverweight-appearing.   Chest Wall:  No deformities, masses, or tenderness noted. Lungs:  Normal respiratory effort, chest expands symmetrically. Lungs are clear to auscultation, no crackles or wheezes. Heart:  Normal rate and regular rhythm. S1 and S2  normal without gallop, murmur, click, rub or other extra sounds. Msk:  patient did have some mild soft tissue swelling involving the right mid back region.  This area appeared to be mildly tender.  There is no erythema or fluctuance   Impression & Recommendations:  Problem # 1:  BACK PAIN (ICD-724.5)  Her updated medication list for this problem includes:    Aspirin 81 Mg Tbec (Aspirin) .Marland Kitchen... Take 1 tablet by mouth once a day    Cyclobenzaprine Hcl 10 Mg Tabs (Cyclobenzaprine hcl) .Marland Kitchen... Take 1 tablet by mouth three times a day    Tramadol Hcl 50 Mg Tabs (Tramadol hcl) .Marland Kitchen... 1 q6h as needed    Nabumetone 500 Mg Tabs (Nabumetone) .Marland Kitchen... 1 two times a day  Her updated medication list for this problem includes:    Aspirin 81 Mg Tbec (Aspirin) .Marland Kitchen... Take 1 tablet by mouth once a day    Cyclobenzaprine Hcl 10 Mg Tabs (Cyclobenzaprine hcl) .Marland Kitchen... Take 1 tablet by mouth three times a day    Tramadol Hcl 50 Mg Tabs (Tramadol hcl) .Marland Kitchen... 1 q6h as needed    Nabumetone 500 Mg Tabs (Nabumetone) .Marland Kitchen... 1 two times a day  Problem # 2:  FIBROMYALGIA (ICD-729.1)  Her updated medication list for this problem includes:    Aspirin 81 Mg Tbec (Aspirin) .Marland Kitchen... Take 1 tablet by mouth once a day    Cyclobenzaprine  Hcl 10 Mg Tabs (Cyclobenzaprine hcl) .Marland Kitchen... Take 1 tablet by mouth three times a day    Tramadol Hcl 50 Mg Tabs (Tramadol hcl) .Marland Kitchen... 1 q6h as needed    Nabumetone 500 Mg Tabs (Nabumetone) .Marland Kitchen... 1 two times a day  Her updated medication list for this problem includes:    Aspirin 81 Mg Tbec (Aspirin) .Marland Kitchen... Take 1 tablet by mouth once a day    Cyclobenzaprine Hcl 10 Mg Tabs (Cyclobenzaprine hcl) .Marland Kitchen... Take 1 tablet by mouth three times a day    Tramadol Hcl 50 Mg Tabs (Tramadol hcl) .Marland Kitchen... 1 q6h as needed    Nabumetone 500 Mg Tabs (Nabumetone) .Marland Kitchen... 1 two times a day  Problem # 3:  DEGENERATIVE JOINT DISEASE (ICD-715.90)  Her updated medication list for this problem includes:    Aspirin 81 Mg Tbec  (Aspirin) .Marland Kitchen... Take 1 tablet by mouth once a day    Tramadol Hcl 50 Mg Tabs (Tramadol hcl) .Marland Kitchen... 1 q6h as needed    Nabumetone 500 Mg Tabs (Nabumetone) .Marland Kitchen... 1 two times a day  Her updated medication list for this problem includes:    Aspirin 81 Mg Tbec (Aspirin) .Marland Kitchen... Take 1 tablet by mouth once a day    Tramadol Hcl 50 Mg Tabs (Tramadol hcl) .Marland Kitchen... 1 q6h as needed    Nabumetone 500 Mg Tabs (Nabumetone) .Marland Kitchen... 1 two times a day  Complete Medication List: 1)  Aspirin 81 Mg Tbec (Aspirin) .... Take 1 tablet by mouth once a day 2)  Cyclobenzaprine Hcl 10 Mg Tabs (Cyclobenzaprine hcl) .... Take 1 tablet by mouth three times a day 3)  Flonase 50 Mcg/act Susp (Fluticasone propionate) .... Spray 2 spray into both nostrils once a day 4)  Hydrochlorothiazide 25 Mg Tabs (Hydrochlorothiazide) .Marland Kitchen.. 1 once daily 5)  Lisinopril 40 Mg Tabs (Lisinopril) .... Take 1 tablet by mouth once a day 6)  Prilosec 20 Mg Cpdr (Omeprazole) .... Take 1 capsule by mouth once a day 7)  Vesicare 5 Mg Tabs (Solifenacin succinate) .... Take 1 tablet by mouth once a day 8)  Zyrtec Allergy 10 Mg Tabs (Cetirizine hcl) .Marland Kitchen.. 1 once daily as needed 9)  Tramadol Hcl 50 Mg Tabs (Tramadol hcl) .Marland Kitchen.. 1 q6h as needed 10)  Nabumetone 500 Mg Tabs (Nabumetone) .Marland Kitchen.. 1 two times a day 11)  Hyomax-sr 0.375 Mg Tb12 (Hyoscyamine sulfate) .... One tablet two times a day 12)  Pravastatin Sodium 40 Mg Tabs (Pravastatin sodium) .Marland Kitchen.. 1 once daily  Other Orders: Flu Vaccine 31yrs + (16109) Administration Flu vaccine - MCR (U0454)  Patient Instructions: 1)  return in one month for follow-up as scheduled; 2)  apply heat, warm compresses 3 times daily as needed 3)  continue present regimen 4)  call if pain worsens or there is worsening in the swelling

## 2010-07-27 NOTE — Assessment & Plan Note (Signed)
Summary: 6 month rov/njr rsc with pt from bump/[facility]   Vital Signs:  Patient profile:   65 year old female Height:      60 inches Weight:      213 pounds BMI:     41.75 Temp:     97.8 degrees F oral BP sitting:   120 / 58  (left arm) Cuff size:   large  Vitals Entered By: Raechel Ache, RN (October 21, 2008 10:15 AM)  CC:  6 mo ROV.  History of Present Illness: a 65 year old patient seen today for follow-up of his follow biannually by oncology for his CLL.  She has fibromyalgia DJD, hypertension, and dyslipidemia.  Doing well, although just into the depth of her mother approximately 3 months ago Britta Mccreedy Smoot)  Allergies: No Known Drug Allergies  Past History:  Past Medical History:    Reviewed history from 12/14/2006 and no changes required:    Hyperlipidemia    Hypertension    Lymphocytic leukemia    CNS sarcoid w/secondary gait disturbance, partial deafness    Fibromyalgia    Obesity    DJD  Review of Systems  The patient denies anorexia, fever, weight loss, weight gain, vision loss, decreased hearing, hoarseness, chest pain, syncope, dyspnea on exertion, peripheral edema, prolonged cough, headaches, hemoptysis, abdominal pain, melena, hematochezia, severe indigestion/heartburn, hematuria, incontinence, genital sores, muscle weakness, suspicious skin lesions, transient blindness, difficulty walking, depression, unusual weight change, abnormal bleeding, enlarged lymph nodes, angioedema, and breast masses.    Physical Exam  General:  overweight-appearing.  overweight-appearing.   Head:  Normocephalic and atraumatic without obvious abnormalities. No apparent alopecia or balding. Eyes:  No corneal or conjunctival inflammation noted. EOMI. Perrla. Funduscopic exam benign, without hemorrhages, exudates or papilledema. Vision grossly normal. Ears:  ,canals occluded with cerumen Nose:  External nasal examination shows no deformity or inflammation. Nasal mucosa are pink and moist  without lesions or exudates. Mouth:  Oral mucosa and oropharynx without lesions or exudates.  Teeth in good repair. Neck:  No deformities, masses, or tenderness noted. Lungs:  Normal respiratory effort, chest expands symmetrically. Lungs are clear to auscultation, no crackles or wheezes. Heart:  Normal rate and regular rhythm. S1 and S2 normal without gallop, murmur, click, rub or other extra sounds. Abdomen:  Bowel sounds positive,abdomen soft and non-tender without masses, organomegaly or hernias noted. Msk:  No deformity or scoliosis noted of thoracic or lumbar spine.   Pulses:  R and L carotid,radial,femoral,dorsalis pedis and posterior tibial pulses are full and equal bilaterally Extremities:  No clubbing, cyanosis, edema, or deformity noted with normal full range of motion of all joints.     Impression & Recommendations:  Problem # 1:  FIBROMYALGIA (ICD-729.1)  Her updated medication list for this problem includes:    Aspirin 81 Mg Tbec (Aspirin) .Marland Kitchen... Take 1 tablet by mouth once a day    Cyclobenzaprine Hcl 10 Mg Tabs (Cyclobenzaprine hcl) .Marland Kitchen... Take 1 tablet by mouth three times a day    Tramadol Hcl 50 Mg Tabs (Tramadol hcl) .Marland Kitchen... 1 q6h as needed    Nabumetone 500 Mg Tabs (Nabumetone) .Marland Kitchen... 1 two times a day  Her updated medication list for this problem includes:    Aspirin 81 Mg Tbec (Aspirin) .Marland Kitchen... Take 1 tablet by mouth once a day    Cyclobenzaprine Hcl 10 Mg Tabs (Cyclobenzaprine hcl) .Marland Kitchen... Take 1 tablet by mouth three times a day    Tramadol Hcl 50 Mg Tabs (Tramadol hcl) .Marland KitchenMarland KitchenMarland KitchenMarland Kitchen 1  q6h as needed    Nabumetone 500 Mg Tabs (Nabumetone) .Marland Kitchen... 1 two times a day  Problem # 2:  DEGENERATIVE JOINT DISEASE (ICD-715.90)  Her updated medication list for this problem includes:    Aspirin 81 Mg Tbec (Aspirin) .Marland Kitchen... Take 1 tablet by mouth once a day    Tramadol Hcl 50 Mg Tabs (Tramadol hcl) .Marland Kitchen... 1 q6h as needed    Nabumetone 500 Mg Tabs (Nabumetone) .Marland Kitchen... 1 two times a day  Her  updated medication list for this problem includes:    Aspirin 81 Mg Tbec (Aspirin) .Marland Kitchen... Take 1 tablet by mouth once a day    Tramadol Hcl 50 Mg Tabs (Tramadol hcl) .Marland Kitchen... 1 q6h as needed    Nabumetone 500 Mg Tabs (Nabumetone) .Marland Kitchen... 1 two times a day  Problem # 3:  HYPERTENSION (ICD-401.9)  Her updated medication list for this problem includes:    Hydrochlorothiazide 25 Mg Tabs (Hydrochlorothiazide) .Marland Kitchen... 1 once daily    Lisinopril 40 Mg Tabs (Lisinopril) .Marland Kitchen... Take 1 tablet by mouth once a day  Her updated medication list for this problem includes:    Hydrochlorothiazide 25 Mg Tabs (Hydrochlorothiazide) .Marland Kitchen... 1 once daily    Lisinopril 40 Mg Tabs (Lisinopril) .Marland Kitchen... Take 1 tablet by mouth once a day  Complete Medication List: 1)  Aspirin 81 Mg Tbec (Aspirin) .... Take 1 tablet by mouth once a day 2)  Cyclobenzaprine Hcl 10 Mg Tabs (Cyclobenzaprine hcl) .... Take 1 tablet by mouth three times a day 3)  Flonase 50 Mcg/act Susp (Fluticasone propionate) .... Spray 2 spray into both nostrils once a day 4)  Hydrochlorothiazide 25 Mg Tabs (Hydrochlorothiazide) .Marland Kitchen.. 1 once daily 5)  Lisinopril 40 Mg Tabs (Lisinopril) .... Take 1 tablet by mouth once a day 6)  Prilosec 20 Mg Cpdr (Omeprazole) .... Take 1 capsule by mouth once a day 7)  Vesicare 5 Mg Tabs (Solifenacin succinate) .... Take 1 tablet by mouth once a day 8)  Zyrtec Allergy 10 Mg Tabs (Cetirizine hcl) .Marland Kitchen.. 1 once daily as needed 9)  Tramadol Hcl 50 Mg Tabs (Tramadol hcl) .Marland Kitchen.. 1 q6h as needed 10)  Nabumetone 500 Mg Tabs (Nabumetone) .Marland Kitchen.. 1 two times a day 11)  Hyomax-sr 0.375 Mg Tb12 (Hyoscyamine sulfate) .... One tablet two times a day 12)  Pravastatin Sodium 40 Mg Tabs (Pravastatin sodium) .Marland Kitchen.. 1 once daily  Patient Instructions: 1)  Please schedule a follow-up appointment in 6 months. 2)  Limit your Sodium (Salt). 3)  It is important that you exercise regularly at least 20 minutes 5 times a week. If you develop chest pain, have  severe difficulty breathing, or feel very tired , stop exercising immediately and seek medical attention. 4)  You need to lose weight. Consider a lower calorie diet and regular exercise.  Prescriptions: PRILOSEC 20 MG CPDR (OMEPRAZOLE) Take 1 capsule by mouth once a day  #90 x 6   Entered and Authorized by:   Gordy Savers  MD   Signed by:   Gordy Savers  MD on 10/21/2008   Method used:   Print then Give to Patient   RxID:   [id number] LISINOPRIL 40 MG TABS (LISINOPRIL) Take 1 tablet by mouth once a day  #90 x 6   Entered and Authorized by:   Gordy Savers  MD   Signed by:   Gordy Savers  MD on 10/21/2008   Method used:   Print then Give to Patient   RxID:  [id number] FLONASE 50 MCG/ACT SUSP (FLUTICASONE PROPIONATE) Spray 2 spray into both nostrils once a day  #3 x 6   Entered and Authorized by:   Gordy Savers  MD   Signed by:   Gordy Savers  MD on 10/21/2008   Method used:   Print then Give to Patient   RxID:   [id number] PRAVASTATIN SODIUM 40 MG TABS (PRAVASTATIN SODIUM) 1 once daily  #90 x 6   Entered and Authorized by:   Gordy Savers  MD   Signed by:   Gordy Savers  MD on 10/21/2008   Method used:   Print then Give to Patient   RxID:   [id number] TRAMADOL HCL 50 MG  TABS (TRAMADOL HCL) 1 q6h as needed  #180 x 6   Entered and Authorized by:   Gordy Savers  MD   Signed by:   Gordy Savers  MD on 10/21/2008   Method used:   Print then Give to Patient   RxID:   [id number] ZYRTEC ALLERGY 10 MG  TABS (CETIRIZINE HCL) 1 once daily as needed  #90 x 6   Entered and Authorized by:   Gordy Savers  MD   Signed by:   Gordy Savers  MD on 10/21/2008   Method used:   Print then Give to Patient   RxID:   [id number] VESICARE 5 MG TABS (SOLIFENACIN SUCCINATE) Take 1 tablet by mouth once a day  #90 x 6   Entered and Authorized by:   Gordy Savers  MD   Signed by:   Gordy Savers  MD on 10/21/2008   Method used:   Print then Give to Patient   RxID:   [id number] HYDROCHLOROTHIAZIDE 25 MG TABS (HYDROCHLOROTHIAZIDE) 1 once daily  #90 x 6   Entered and Authorized by:   Gordy Savers  MD   Signed by:   Gordy Savers  MD on 10/21/2008   Method used:   Print then Give to Patient   RxID:   [id number]

## 2010-07-27 NOTE — Letter (Signed)
Summary: [facility]  [facility]   Imported By: Maryln Gottron 03/12/2009 10:07:28  _____________________________________________________________________  External Attachment:    Type:   Image     Comment:   External Document

## 2010-07-27 NOTE — Miscellaneous (Signed)
  Medications Added ALPRAZOLAM 0.5 MG TABS (ALPRAZOLAM) Take 1 tablet by mouth twice a day ASPIRIN 81 MG TBEC (ASPIRIN) Take 1 tablet by mouth once a day CELEBREX 200 MG CAPS (CELECOXIB) Take 1 capsule by mouth twice a day CYCLOBENZAPRINE HCL 10 MG TABS (CYCLOBENZAPRINE HCL) Take 1 tablet by mouth three times a day FLONASE 50 MCG/ACT SUSP (FLUTICASONE PROPIONATE) Spray 2 spray into both nostrils once a day HYDROCHLOROTHIAZIDE 25 MG TABS (HYDROCHLOROTHIAZIDE) 1 once daily LIPITOR 20 MG TABS (ATORVASTATIN CALCIUM) 1 once daily LISINOPRIL 40 MG TABS (LISINOPRIL) Take 1 tablet by mouth once a day PRILOSEC 20 MG CPDR (OMEPRAZOLE) Take 1 capsule by mouth once a day SYMAX DUOTAB 0.375 MG TBCR (HYOSCYAMINE SULFATE) Take 1 tablet by mouth twice a day VESICARE 5 MG TABS (SOLIFENACIN SUCCINATE) Take 1 tablet by mouth once a day ZYRTEC ALLERGY 10 MG  TABS (CETIRIZINE HCL) 1 once daily as needed      Allergies Added: NKDA Clinical Lists Changes  Medications: Added new medication of ALPRAZOLAM 0.5 MG TABS (ALPRAZOLAM) Take 1 tablet by mouth twice a day Added new medication of ASPIRIN 81 MG TBEC (ASPIRIN) Take 1 tablet by mouth once a day Added new medication of CELEBREX 200 MG CAPS (CELECOXIB) Take 1 capsule by mouth twice a day Added new medication of CYCLOBENZAPRINE HCL 10 MG TABS (CYCLOBENZAPRINE HCL) Take 1 tablet by mouth three times a day Added new medication of FLONASE 50 MCG/ACT SUSP (FLUTICASONE PROPIONATE) Spray 2 spray into both nostrils once a day Added new medication of HYDROCHLOROTHIAZIDE 25 MG TABS (HYDROCHLOROTHIAZIDE) 1 once daily Added new medication of LIPITOR 20 MG TABS (ATORVASTATIN CALCIUM) 1 once daily Added new medication of LISINOPRIL 40 MG TABS (LISINOPRIL) Take 1 tablet by mouth once a day Added new medication of PRILOSEC 20 MG CPDR (OMEPRAZOLE) Take 1 capsule by mouth once a day Added new medication of SYMAX DUOTAB 0.375 MG TBCR (HYOSCYAMINE SULFATE) Take 1 tablet by mouth  twice a day Added new medication of VESICARE 5 MG TABS (SOLIFENACIN SUCCINATE) Take 1 tablet by mouth once a day Added new medication of ZYRTEC ALLERGY 10 MG  TABS (CETIRIZINE HCL) 1 once daily as needed Observations: Added new observation of LLIMPORTALLS: completed (06/01/2007 10:52) Added new observation of NKA: T (06/01/2007 10:52) Added new observation of LLIMPORTMEDS: completed (06/01/2007 10:52)

## 2010-07-27 NOTE — Assessment & Plan Note (Signed)
Summary: ROA X 6 MTHS / RS   Vital Signs:  Patient profile:   65 year old female Weight:      214 pounds Temp:     97.7 degrees F oral BP sitting:   110 / 66  (left arm) Cuff size:   large  Vitals Entered By: Duard Brady LPN (October 21, 2009 9:48 AM) CC: 6 mos rov - doing ok Is Patient Diabetic? No   CC:  6 mos rov - doing ok.  History of Present Illness: 65 year old patient who is in today for follow-up.  History of hypertension, untreated dyslipidemia.  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  Unfortunately, there is been some modest weight gain since her last visit here.  She has a history of sarcoid followed at [facility] and also a history of fibromyalgia followed by rheumatology.  She has stage I, stable CLL and is followed by hematology.  She is doing quite well today except for some mild allergy-related issues.  No concerns or complaints.  Denies any cardiopulmonary complaints.  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 Cholecystectomy Tonsillectomy  colonoscopy June 2006  Family History: Reviewed history from 12/17/2007 and no changes required. father died age 8.  History of COPD, dementia mother history of hypertension , osteoarthritis One sister in good health  Social History: Reviewed history from 12/17/2007 and no changes required. Married Regular exercise-no no children  Review of Systems       The patient complains of weight gain and vision loss.  The patient denies anorexia, fever, weight loss, decreased hearing, hoarseness, chest pain, syncope, dyspnea on exertion,  peripheral edema, prolonged cough, headaches, hemoptysis, abdominal pain, melena, hematochezia, severe indigestion/heartburn, hematuria, incontinence, genital sores, muscle weakness, suspicious skin lesions, transient blindness, difficulty walking, depression, unusual weight change, abnormal bleeding, enlarged lymph nodes, angioedema, and breast masses.    Physical Exam  General:  overweight-appearing.  110/70overweight-appearing.   Head:  Normocephalic and atraumatic without obvious abnormalities. No apparent alopecia or balding. Eyes:  No corneal or conjunctival inflammation noted. EOMI. Perrla. Funduscopic exam benign, without hemorrhages, exudates or papilledema. Vision grossly normal. Mouth:  Oral mucosa and oropharynx without lesions or exudates.  Teeth in good repair. Neck:  No deformities, masses, or tenderness noted. Lungs:  Normal respiratory effort, chest expands symmetrically. Lungs are clear to auscultation, no crackles or wheezes. Heart:  Normal rate and regular rhythm. S1 and S2 normal without gallop, murmur, click, rub or other extra sounds. Abdomen:  obese soft and nontender.  No splenomegaly Msk:  No deformity or scoliosis noted of thoracic or lumbar spine.   Pulses:  R and L carotid,radial,femoral,dorsalis pedis and posterior tibial pulses are full and equal bilaterally Extremities:  No clubbing, cyanosis, edema, or deformity noted with normal full range of motion of all joints.   Skin:  Intact without suspicious lesions or rashes Cervical Nodes:  No lymphadenopathy noted   Impression & Recommendations:  Problem # 1:  FIBROMYALGIA (ICD-729.1)  Her updated medication list for this problem includes:    Aspirin 81 Mg Tbec (Aspirin) .Marland Kitchen... Take 1 tablet by mouth once a day    Cyclobenzaprine Hcl 10 Mg Tabs (Cyclobenzaprine hcl) .Marland Kitchen... Take 1 tablet by mouth  three times a day    Tramadol Hcl 50 Mg Tabs (Tramadol hcl) .Marland Kitchen... 1 q6h as needed    Nabumetone 500 Mg Tabs (Nabumetone)  .Marland Kitchen... 1 two times a day  Her updated medication list for this problem includes:    Aspirin 81 Mg Tbec (Aspirin) .Marland Kitchen... Take 1 tablet by mouth once a day    Cyclobenzaprine Hcl 10 Mg Tabs (Cyclobenzaprine hcl) .Marland Kitchen... Take 1 tablet by mouth three times a day    Tramadol Hcl 50 Mg Tabs (Tramadol hcl) .Marland Kitchen... 1 q6h as needed    Nabumetone 500 Mg Tabs (Nabumetone) .Marland Kitchen... 1 two times a day  Problem # 2:  DEGENERATIVE JOINT DISEASE (ICD-715.90)  Her updated medication list for this problem includes:    Aspirin 81 Mg Tbec (Aspirin) .Marland Kitchen... Take 1 tablet by mouth once a day    Tramadol Hcl 50 Mg Tabs (Tramadol hcl) .Marland Kitchen... 1 q6h as needed    Nabumetone 500 Mg Tabs (Nabumetone) .Marland Kitchen... 1 two times a day  Her updated medication list for this problem includes:    Aspirin 81 Mg Tbec (Aspirin) .Marland Kitchen... Take 1 tablet by mouth once a day    Tramadol Hcl 50 Mg Tabs (Tramadol hcl) .Marland Kitchen... 1 q6h as needed    Nabumetone 500 Mg Tabs (Nabumetone) .Marland Kitchen... 1 two times a day  Problem # 3:  CLL (ICD-204.10)  Problem # 4:  HYPERTENSION (ICD-401.9)  Her updated medication list for this problem includes:    Hydrochlorothiazide 25 Mg Tabs (Hydrochlorothiazide) .Marland Kitchen... 1 once daily    Lisinopril 40 Mg Tabs (Lisinopril) .Marland Kitchen... Take 1 tablet by mouth once a day  Her updated medication list for this problem includes:    Hydrochlorothiazide 25 Mg Tabs (Hydrochlorothiazide) .Marland Kitchen... 1 once daily    Lisinopril 40 Mg Tabs (Lisinopril) .Marland Kitchen... Take 1 tablet by mouth once a day  Problem # 5:  HYPERLIPIDEMIA (ICD-272.4)  Her updated medication list for this problem includes:    Pravastatin Sodium 40 Mg Tabs (Pravastatin sodium) .Marland Kitchen... 1 once daily  Her updated medication list for this problem includes:    Pravastatin Sodium 40 Mg Tabs (Pravastatin sodium) .Marland Kitchen... 1 once daily  Complete Medication List: 1)  Aspirin 81 Mg Tbec (Aspirin) .... Take 1 tablet by mouth once a day 2)  Cyclobenzaprine Hcl 10 Mg Tabs (Cyclobenzaprine hcl) .... Take 1  tablet by mouth three times a day 3)  Flonase 50 Mcg/act Susp (Fluticasone propionate) .... Spray 2 spray into both nostrils once a day 4)  Hydrochlorothiazide 25 Mg Tabs (Hydrochlorothiazide) .Marland Kitchen.. 1 once daily 5)  Lisinopril 40 Mg Tabs (Lisinopril) .... Take 1 tablet by mouth once a day 6)  Prilosec 20 Mg Cpdr (Omeprazole) .... Take 1 capsule by mouth once a day 7)  Zyrtec Allergy 10 Mg Tabs (Cetirizine hcl) .Marland Kitchen.. 1 once daily as needed 8)  Tramadol Hcl 50 Mg Tabs (Tramadol hcl) .Marland Kitchen.. 1 q6h as needed 9)  Nabumetone 500 Mg Tabs (Nabumetone) .Marland Kitchen.. 1 two times a day 10)  Hyomax-sr 0.375 Mg Tb12 (Hyoscyamine sulfate) .... One tablet two times a day 11)  Pravastatin Sodium 40 Mg Tabs (Pravastatin sodium) .Marland Kitchen.. 1 once daily 12)  Hydrocodone-homatropine 5-1.5 Mg/36ml Syrp (Hydrocodone-homatropine) .Marland Kitchen.. 1 teaspoon every 6 hours as needed for cough or congestion 13)  Vesicare 10 Mg Tabs (Solifenacin succinate) .... One daily  Patient Instructions: 1)  Please schedule a follow-up appointment in 4 months. 2)  Advised not to eat any food or drink any liquids after 10 PM  the night before your procedure. 3)  Limit your Sodium (Salt). 4)  It is important that you exercise regularly at least 20 minutes 5 times a week. If you develop chest pain, have severe difficulty breathing, or feel very tired , stop exercising immediately and seek medical attention. 5)  You need to lose weight. Consider a lower calorie diet and regular exercise.  6)  Check your Blood Pressure regularly. If it is above: 150/90  you should make an appointment. Prescriptions: VESICARE 10 MG TABS (SOLIFENACIN SUCCINATE) one daily  #90 x 4   Entered and Authorized by:   Gordy Savers  MD   Signed by:   Gordy Savers  MD on 10/21/2009   Method used:   Electronically to        [facility] [phone]* (retail)       [address]       [place], [place]  [address]       Ph: [phone]       Fax: [phone]   RxID:    [id number] PRAVASTATIN SODIUM 40 MG TABS (PRAVASTATIN SODIUM) 1 once daily  #90 x 6   Entered and Authorized by:   Gordy Savers  MD   Signed by:   Gordy Savers  MD on 10/21/2009   Method used:   Electronically to        [facility] [phone]* (retail)       [address]       [place], [place]  [address]       Ph: [phone]       Fax: [phone]   RxID:   [id number] ZYRTEC ALLERGY 10 MG  TABS (CETIRIZINE HCL) 1 once daily as needed  #90 x 6   Entered and Authorized by:   Gordy Savers  MD   Signed by:   Gordy Savers  MD on 10/21/2009   Method used:   Electronically to        [facility] [phone]* (retail)       [address]       [place], [place]  [address]       Ph: [phone]       Fax: [phone]   RxID:   [id number] TRAMADOL HCL 50 MG  TABS (TRAMADOL HCL) 1 q6h as needed  #180 x 6   Entered and Authorized by:   Gordy Savers  MD   Signed by:   Gordy Savers  MD on 10/21/2009   Method used:   Electronically to        [facility] [phone]* (retail)       [address]       [place], [place]  [address]       Ph: [phone]       Fax: [phone]   RxID:   [id number] PRILOSEC 20 MG CPDR (OMEPRAZOLE) Take 1 capsule by mouth once a day  #90 x 6   Entered and Authorized by:   Gordy Savers  MD   Signed by:   Gordy Savers  MD on 10/21/2009   Method used:   Electronically to        [facility] [phone]* (retail)       [address]       [place], [place]  [address]       Ph: [phone]  Fax: [phone]   RxID:   [id number] LISINOPRIL 40 MG TABS (LISINOPRIL) Take 1 tablet by mouth once a day  #90 x 2   Entered and Authorized by:   Gordy Savers  MD   Signed by:   Gordy Savers  MD on 10/21/2009   Method used:   Electronically to        [facility] [phone]* (retail)       [address]       [place], [place]  [address]       Ph: [phone]       Fax: [phone]    RxID:   [id number] HYDROCHLOROTHIAZIDE 25 MG TABS (HYDROCHLOROTHIAZIDE) 1 once daily  #90 x 2   Entered and Authorized by:   Gordy Savers  MD   Signed by:   Gordy Savers  MD on 10/21/2009   Method used:   Electronically to        [facility] [phone]* (retail)       [address]       [place], [place]  [address]       Ph: [phone]       Fax: [phone]   RxID:   [id number] FLONASE 50 MCG/ACT SUSP (FLUTICASONE PROPIONATE) Spray 2 spray into both nostrils once a day  #3 x 6   Entered and Authorized by:   Gordy Savers  MD   Signed by:   Gordy Savers  MD on 10/21/2009   Method used:   Electronically to        [facility] [phone]* (retail)       [address]       [place], [place]  [address]       Ph: [phone]       Fax: [phone]   RxID:   [id number] CYCLOBENZAPRINE HCL 10 MG TABS (CYCLOBENZAPRINE HCL) Take 1 tablet by mouth three times a day  #180 x 6   Entered and Authorized by:   Gordy Savers  MD   Signed by:   Gordy Savers  MD on 10/21/2009   Method used:   Electronically to        [facility] [phone]* (retail)       [address]       [place], [place]  [address]       Ph: [phone]       Fax: [phone]   RxID:   [id number]

## 2010-07-27 NOTE — Procedures (Signed)
Summary: colonoscopy  colonoscopy   Imported By: Kassie Mends 12/17/2007 11:35:11  _____________________________________________________________________  External Attachment:    Type:   Image     Comment:   colonoscopy

## 2010-07-27 NOTE — Letter (Signed)
Summary: Rheumatology-Dr. Stacey Drain  Rheumatology-Dr. Stacey Drain   Imported By: Maryln Gottron 10/15/2009 13:15:46  _____________________________________________________________________  External Attachment:    Type:   Image     Comment:   External Document

## 2010-07-27 NOTE — Letter (Signed)
Summary: Dr Marshell Garfinkel note  Dr Marshell Garfinkel note   Imported By: Kassie Mends 06/26/2007 08:45:20  _____________________________________________________________________  External Attachment:    Type:   Image     Comment:   Scanned Image

## 2010-07-27 NOTE — Assessment & Plan Note (Signed)
Summary: 6 MONTH ROA/JLS   Vital Signs:  Patient Profile:   65 Years Old Female Weight:      211 pounds BP sitting:   114 / 76  (left arm) Cuff size:   large  Vitals Entered By: Raechel Ache, RN (June 12, 2007 10:36 AM)                 Chief Complaint:  ROV.Sheryl [name]  History of Present Illness: 65 year old female, history of CLL, fibromyalgia, hypertension, dyslipidemia  Current Allergies: No known allergies    Family History:    father died age 28.  History of COPD, dementia    mother history of hypertension [place] to arthritis        One sister in good health    Review of Systems       scheduled to see Dr. Donnie Coffin in follow-up later this month for her CLL   Physical Exam  General:     overweight-appearing.   Head:     Normocephalic and atraumatic without obvious abnormalities. No apparent alopecia or balding. Eyes:     No corneal or conjunctival inflammation noted. EOMI. Perrla. Funduscopic exam benign, without hemorrhages, exudates or papilledema. Vision grossly normal. Mouth:     Oral mucosa and oropharynx without lesions or exudates.  Teeth in good repair. Neck:     No deformities, masses, or tenderness noted. Lungs:     Normal respiratory effort, chest expands symmetrically. Lungs are clear to auscultation, no crackles or wheezes. Heart:     Normal rate and regular rhythm. S1 and S2 normal without gallop, murmur, click, rub or other extra sounds. Abdomen:     Bowel sounds positive,abdomen soft and non-tender without masses, organomegaly or hernias noted. Msk:     No deformity or scoliosis noted of thoracic or lumbar spine.   Pulses:     R and L carotid,radial,femoral,dorsalis pedis and posterior tibial pulses are full and equal bilaterally Extremities:     No clubbing, cyanosis, edema, or deformity noted with normal full range of motion of all joints.      Impression & Recommendations:  Problem # 1:  FIBROMYALGIA (ICD-729.1)  Her updated  medication list for this problem includes:    Aspirin 81 Mg Tbec (Aspirin) .Sheryl [name]... Take 1 tablet by mouth once a day    Celebrex 200 Mg Caps (Celecoxib) .Sheryl [name]... Take 1 capsule by mouth twice a day    Cyclobenzaprine Hcl 10 Mg Tabs (Cyclobenzaprine hcl) .Sheryl [name]... Take 1 tablet by mouth three times a day    Tramadol Hcl 50 Mg Tabs (Tramadol hcl) .Sheryl [name]... 1 q6h as needed   Problem # 2:  CLL (ICD-204.10)  Problem # 3:  HYPERTENSION (ICD-401.9) lot [id number], EXP 30 jun 09, [employer] pasteur left deltoid IM, 0.5 cc.  Her updated medication list for this problem includes:    Hydrochlorothiazide 25 Mg Tabs (Hydrochlorothiazide) .Sheryl [name]... 1 once daily    Lisinopril 40 Mg Tabs (Lisinopril) .Sheryl [name]... Take 1 tablet by mouth once a day   Problem # 4:  HYPERLIPIDEMIA (ICD-272.4)  Her updated medication list for this problem includes:    Lipitor 20 Mg Tabs (Atorvastatin calcium) .Sheryl [name]... 1 once daily   Complete Medication List: 1)  Alprazolam 0.5 Mg Tabs (Alprazolam) .... Take 1 tablet by mouth twice a day 2)  Aspirin 81 Mg Tbec (Aspirin) .... Take 1 tablet by mouth once a day 3)  Celebrex 200 Mg Caps (Celecoxib) .... Take 1 capsule by mouth twice a day 4)  Cyclobenzaprine Hcl 10 Mg Tabs (Cyclobenzaprine hcl) .... Take 1 tablet by mouth three times a day 5)  Flonase 50 Mcg/act Susp (Fluticasone propionate) .... Spray 2 spray into both nostrils once a day 6)  Hydrochlorothiazide 25 Mg Tabs (Hydrochlorothiazide) .Sheryl [name].. 1 once daily 7)  Lipitor 20 Mg Tabs (Atorvastatin calcium) .Sheryl [name].. 1 once daily 8)  Lisinopril 40 Mg Tabs (Lisinopril) .... Take 1 tablet by mouth once a day 9)  Prilosec 20 Mg Cpdr (Omeprazole) .... Take 1 capsule by mouth once a day 10)  Symax Duotab 0.375 Mg Tbcr (Hyoscyamine sulfate) .... Take 1 tablet by mouth twice a day 11)  Vesicare 5 Mg Tabs (Solifenacin succinate) .... Take 1 tablet by mouth once a day 12)  Zyrtec Allergy 10 Mg Tabs (Cetirizine hcl) .Sheryl [name].. 1 once daily as needed 13)  Tramadol Hcl 50 Mg  Tabs (Tramadol hcl) .Sheryl [name].. 1 q6h as needed  Other Orders: Influenza Vaccine NON MCR (01027)   Patient Instructions: 1)  Please schedule a follow-up appointment in 6 months. 2)  Limit your Sodium (Salt) to less than 2 grams a day(slightly less than 1/2 a teaspoon) to prevent fluid retention, swelling, or worsening of symptoms. 3)  It is important that you exercise regularly at least 20 minutes 5 times a week. If you develop chest pain, have severe difficulty breathing, or feel very tired , stop exercising immediately and seek medical attention. 4)  You need to lose weight. Consider a lower calorie diet and regular exercise.  5)  Check your Blood Pressure regularly. If it is above: you should make an appointment.    Prescriptions: TRAMADOL HCL 50 MG  TABS (TRAMADOL HCL) 1 q6h as needed  #180 x 6   Entered and Authorized by:   Gordy Savers  MD   Signed by:   Gordy Savers  MD on 06/12/2007   Method used:   Print then Give to Patient   RxID:   [id number] ZYRTEC ALLERGY 10 MG  TABS (CETIRIZINE HCL) 1 once daily as needed  #90 x 6   Entered and Authorized by:   Gordy Savers  MD   Signed by:   Gordy Savers  MD on 06/12/2007   Method used:   Print then Give to Patient   RxID:   [id number] VESICARE 5 MG TABS (SOLIFENACIN SUCCINATE) Take 1 tablet by mouth once a day  #90 x 6   Entered and Authorized by:   Gordy Savers  MD   Signed by:   Gordy Savers  MD on 06/12/2007   Method used:   Print then Give to Patient   RxID:   [id number] FTDDU DUOTAB 0.375 MG TBCR (HYOSCYAMINE SULFATE) Take 1 tablet by mouth twice a day  #180 x 6   Entered and Authorized by:   Gordy Savers  MD   Signed by:   Gordy Savers  MD on 06/12/2007   Method used:   Print then Give to Patient   RxID:   [id number] PRILOSEC 20 MG CPDR (OMEPRAZOLE) Take 1 capsule by mouth once a day  #90 x 6   Entered and Authorized by:   Gordy Savers  MD    Signed by:   Gordy Savers  MD on 06/12/2007   Method used:   Print then Give to Patient   RxID:   [id number] LISINOPRIL 40 MG TABS (LISINOPRIL) Take 1 tablet by mouth once a day  #90 x 6  Entered and Authorized by:   Gordy Savers  MD   Signed by:   Gordy Savers  MD on 06/12/2007   Method used:   Print then Give to Patient   RxID:   [id number] LIPITOR 20 MG TABS (ATORVASTATIN CALCIUM) 1 once daily  #90 x 6   Entered and Authorized by:   Gordy Savers  MD   Signed by:   Gordy Savers  MD on 06/12/2007   Method used:   Print then Give to Patient   RxID:   [id number] HYDROCHLOROTHIAZIDE 25 MG TABS (HYDROCHLOROTHIAZIDE) 1 once daily  #90 x 6   Entered and Authorized by:   Gordy Savers  MD   Signed by:   Gordy Savers  MD on 06/12/2007   Method used:   Print then Give to Patient   RxID:   [id number] FLONASE 50 MCG/ACT SUSP (FLUTICASONE PROPIONATE) Spray 2 spray into both nostrils once a day  #3 x 6   Entered and Authorized by:   Gordy Savers  MD   Signed by:   Gordy Savers  MD on 06/12/2007   Method used:   Print then Give to Patient   RxID:   [id number] CYCLOBENZAPRINE HCL 10 MG TABS (CYCLOBENZAPRINE HCL) Take 1 tablet by mouth three times a day  #180 x 6   Entered and Authorized by:   Gordy Savers  MD   Signed by:   Gordy Savers  MD on 06/12/2007   Method used:   Print then Give to Patient   RxID:   [id number] CELEBREX 200 MG CAPS (CELECOXIB) Take 1 capsule by mouth twice a day  #180 x 6   Entered and Authorized by:   Gordy Savers  MD   Signed by:   Gordy Savers  MD on 06/12/2007   Method used:   Print then Give to Patient   RxID:   [id number] ALPRAZOLAM 0.5 MG TABS (ALPRAZOLAM) Take 1 tablet by mouth twice a day  #90 x 6   Entered and Authorized by:   Gordy Savers  MD   Signed by:   Gordy Savers  MD on 06/12/2007   Method used:   Print then  Give to Patient   RxID:   [id number]  ]  Influenza Vaccine    Vaccine Type: Fluvax Non-MCR    Given by: Raechel Ache, RN  Flu Vaccine Consent Questions    Do you have a history of severe allergic reactions to this vaccine? no    Any prior history of allergic reactions to egg and/or gelatin? no    Do you have a sensitivity to the preservative Thimersol? no    Do you have a past history of Guillan-Barre Syndrome? no    Do you currently have an acute febrile illness? no    Have you ever had a severe reaction to latex? no    Vaccine information given and explained to patient? yes    Are you currently pregnant? no

## 2010-07-27 NOTE — Consult Note (Signed)
Summary: Rheumatology-Dr. Kellie Simmering  Rheumatology-Dr. Kellie Simmering   Imported By: Maryln Gottron 04/09/2008 15:13:48  _____________________________________________________________________  External Attachment:    Type:   Image     Comment:   External Document

## 2010-07-27 NOTE — Assessment & Plan Note (Signed)
Summary: CONGESTION//CCM   Vital Signs:  Patient profile:   65 year old female Weight:      208 pounds BMI:     40.77 Temp:     98.7 degrees F oral BP sitting:   112 / 54  (left arm) Cuff size:   large  Vitals Entered By: Raechel Ache, RN (June 01, 2009 10:19 AM) CC: C/o sick x 1 week with sore throat, congestion and cough.   CC:  C/o sick x 1 week with sore throat and congestion and cough..  History of Present Illness: 64 year old patient who has a one-week history of head and chest congestion and productive cough.  Refractory cough is her chief complaint.  There's been no fever or purulent sputum production.  Denies any chest pain or shortness of breath.  she has treated hypertension, which has been stable  Allergies: No Known Drug Allergies  Past History:  Past Medical History: Reviewed history from 12/14/2006 and no changes required. 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  The patient denies anorexia, fever, weight loss, weight gain, vision loss, decreased hearing, hoarseness, chest pain, syncope, dyspnea on exertion, peripheral edema, headaches, hemoptysis, abdominal pain, melena, hematochezia, severe indigestion/heartburn, hematuria, incontinence, genital sores, muscle weakness, suspicious skin lesions, transient blindness, difficulty walking, depression, unusual weight change, abnormal bleeding, enlarged lymph nodes, angioedema, and breast masses.    Physical Exam  General:  overweight-appearing.  overweight-appearing.   Head:  Normocephalic and atraumatic without obvious abnormalities. No apparent alopecia or balding. Eyes:  No corneal or conjunctival inflammation noted. EOMI. Perrla. Funduscopic exam benign, without hemorrhages, exudates or papilledema. Vision grossly normal. Ears:  External ear  exam shows no significant lesions or deformities.  Otoscopic examination reveals clear canals, tympanic membranes are intact bilaterally without bulging, retraction, inflammation or discharge. Hearing is grossly normal bilaterally. left canal with cerumen Mouth:  Oral mucosa and oropharynx without lesions or exudates.  Teeth in good repair. Neck:  No deformities, masses, or tenderness noted. Lungs:  Normal respiratory effort, chest expands symmetrically. Lungs are clear to auscultation, no crackles or wheezes. Heart:  Normal rate and regular rhythm. S1 and S2 normal without gallop, murmur, click, rub or other extra sounds.   Impression & Recommendations:  Problem # 1:  URI (ICD-465.9)  Her updated medication list for this problem includes:    Aspirin 81 Mg Tbec (Aspirin) .Marland Kitchen... Take 1 tablet by mouth once a day    Zyrtec Allergy 10 Mg Tabs (Cetirizine hcl) .Marland Kitchen... 1 once daily as needed    Nabumetone 500 Mg Tabs (Nabumetone) .Marland Kitchen... 1 two times a day    Hydrocodone-homatropine 5-1.5 Mg/76ml Syrp (Hydrocodone-homatropine) .Marland Kitchen... 1 teaspoon every 6 hours as needed for cough or congestion  Her updated medication list for this problem includes:    Aspirin 81 Mg Tbec (Aspirin) .Marland Kitchen... Take 1 tablet by mouth once a day    Zyrtec Allergy 10 Mg Tabs (Cetirizine hcl) .Marland Kitchen... 1 once daily as needed    Nabumetone 500 Mg Tabs (Nabumetone) .Marland Kitchen... 1 two times a day    Hydrocodone-homatropine 5-1.5 Mg/22ml Syrp (Hydrocodone-homatropine) .Marland Kitchen... 1 teaspoon every 6 hours as needed for cough or congestion  Problem # 2:  HYPERTENSION (ICD-401.9)  Her updated medication list for this problem includes:    Hydrochlorothiazide 25 Mg Tabs (Hydrochlorothiazide) .Marland Kitchen... 1 once daily    Lisinopril 40 Mg  Tabs (Lisinopril) .Marland Kitchen... Take 1 tablet by mouth once a day  Her updated medication list for this problem includes:    Hydrochlorothiazide 25 Mg Tabs (Hydrochlorothiazide) .Marland Kitchen... 1 once daily    Lisinopril 40 Mg Tabs (Lisinopril)  .Marland Kitchen... Take 1 tablet by mouth once a day  Complete Medication List: 1)  Aspirin 81 Mg Tbec (Aspirin) .... Take 1 tablet by mouth once a day 2)  Cyclobenzaprine Hcl 10 Mg Tabs (Cyclobenzaprine hcl) .... Take 1 tablet by mouth three times a day 3)  Flonase 50 Mcg/act Susp (Fluticasone propionate) .... Spray 2 spray into both nostrils once a day 4)  Hydrochlorothiazide 25 Mg Tabs (Hydrochlorothiazide) .Marland Kitchen.. 1 once daily 5)  Lisinopril 40 Mg Tabs (Lisinopril) .... Take 1 tablet by mouth once a day 6)  Prilosec 20 Mg Cpdr (Omeprazole) .... Take 1 capsule by mouth once a day 7)  Zyrtec Allergy 10 Mg Tabs (Cetirizine hcl) .Marland Kitchen.. 1 once daily as needed 8)  Tramadol Hcl 50 Mg Tabs (Tramadol hcl) .Marland Kitchen.. 1 q6h as needed 9)  Nabumetone 500 Mg Tabs (Nabumetone) .Marland Kitchen.. 1 two times a day 10)  Hyomax-sr 0.375 Mg Tb12 (Hyoscyamine sulfate) .... One tablet two times a day 11)  Pravastatin Sodium 40 Mg Tabs (Pravastatin sodium) .Marland Kitchen.. 1 once daily 12)  Hydrocodone-homatropine 5-1.5 Mg/55ml Syrp (Hydrocodone-homatropine) .Marland Kitchen.. 1 teaspoon every 6 hours as needed for cough or congestion 13)  Vesicare 10 Mg Tabs (Solifenacin succinate) .... One daily  Patient Instructions: 1)  Limit your Sodium (Salt). 2)  Get plenty of rest, drink lots of clear liquids, and use Tylenol or Ibuprofen for fever and comfort. Return in 7-10 days if you're not better:sooner if you're feeling worse. Prescriptions: VESICARE 10 MG TABS (SOLIFENACIN SUCCINATE) one daily  #90 x 4   Entered and Authorized by:   Gordy Savers  MD   Signed by:   Gordy Savers  MD on 06/01/2009   Method used:   Print then Give to Patient   RxID:   [id number] HYDROCODONE-HOMATROPINE 5-1.5 MG/5ML SYRP (HYDROCODONE-HOMATROPINE) 1 teaspoon every 6 hours as needed for cough or congestion  #6 oz x 1   Entered and Authorized by:   Gordy Savers  MD   Signed by:   Gordy Savers  MD on 06/01/2009   Method used:   Print then Give to Patient   RxID:    [id number]

## 2010-07-27 NOTE — Progress Notes (Signed)
Summary: jyoscyamine discontiued  Phone Note From Pharmacy   Caller: [facility]  [address] [phone]* Summary of Call: phamacy is calling because the rx for hyoscyamine has been discontiued.  is there anything else you would like this patient to try? Initial call taken by: Kern Reap CMA,  September 29, 2008 4:20 PM    Not aware of any substitutes-  use Tylenol every 6 hours as needed for pain  Appended Document: jyoscyamine discontiued left message on machine for pharmacy

## 2010-07-27 NOTE — Letter (Signed)
Summary: [facility]  [facility]   Imported By: Maryln Gottron 09/18/2008 15:55:59  _____________________________________________________________________  External Attachment:    Type:   Image     Comment:   External Document

## 2010-07-27 NOTE — Progress Notes (Signed)
Summary: OTC potassium gluconate  Phone Note Call from Patient Call back at [phone]   Caller: live Call For: kwia Summary of Call: Can I take OTC potassium gluconate one daily with all my meds? Initial call taken by: Rudy Jew, RN,  December 10, 2008 2:23 PM  Follow-up for Phone Call        yes but I do not see any clinical indication Follow-up by: Gordy Savers  MD,  December 11, 2008 8:00 AM      Appended Document: OTC potassium gluconate Pt given Dr. Charm Rings recommendations.

## 2010-07-27 NOTE — Assessment & Plan Note (Signed)
Summary: flu shot/njr   Nurse Visit    Prior Medications: ASPIRIN 81 MG TBEC (ASPIRIN) Take 1 tablet by mouth once a day CYCLOBENZAPRINE HCL 10 MG TABS (CYCLOBENZAPRINE HCL) Take 1 tablet by mouth three times a day FLONASE 50 MCG/ACT SUSP (FLUTICASONE PROPIONATE) Spray 2 spray into both nostrils once a day HYDROCHLOROTHIAZIDE 25 MG TABS (HYDROCHLOROTHIAZIDE) 1 once daily LISINOPRIL 40 MG TABS (LISINOPRIL) Take 1 tablet by mouth once a day PRILOSEC 20 MG CPDR (OMEPRAZOLE) Take 1 capsule by mouth once a day VESICARE 5 MG TABS (SOLIFENACIN SUCCINATE) Take 1 tablet by mouth once a day ZYRTEC ALLERGY 10 MG  TABS (CETIRIZINE HCL) 1 once daily as needed TRAMADOL HCL 50 MG  TABS (TRAMADOL HCL) 1 q6h as needed NABUMETONE 500 MG  TABS (NABUMETONE) 1 two times a day PRAVASTATIN SODIUM 80 MG  TABS (PRAVASTATIN SODIUM) one daily HYOMAX-SR 0.375 MG  TB12 (HYOSCYAMINE SULFATE) one tablet two times a day Current Allergies: No known allergies   Flu Vaccine Consent Questions     Do you have a history of severe allergic reactions to this vaccine? no    Any prior history of allergic reactions to egg and/or gelatin? no    Do you have a sensitivity to the preservative Thimersol? no    Do you have a past history of Guillan-Barre Syndrome? no    Do you currently have an acute febrile illness? no    Have you ever had a severe reaction to latex? no    Vaccine information given and explained to patient? yes    Are you currently pregnant? no    Lot Number:[id number]   Site Given  Left Deltoid [email or web address].medflu    Orders Added: 1)  Flu Vaccine 38yrs + [01751] 2)  Administration Flu vaccine [G0008]    ]

## 2010-07-27 NOTE — Progress Notes (Signed)
Summary: Generic Rx med request  Phone Note Call from Patient Call back at Home Phone [phone]   Caller: Patient Call For: Amador Cunas Summary of Call: P to calling to inform Dr Amador Cunas her husband was laid-off last week.  They no longer have Rx insurance.  Pt requesting something cheaper or a generic for Vasicare 5 mg daily ($120.00 without ins for #30), and Lipitor 20 mg daily. [facility] [address] MSG OK on home phone Initial call taken by: Sid Falcon LPN,  October 01, 2007 9:12 AM  Follow-up for Phone Call        Pravastatin 40  #90  one daily (sub for Lipitor) No generics available for Vesicare Follow-up by: Gordy Savers  MD,  October 01, 2007 12:38 PM         Appended Document: Generic Rx med request    Clinical Lists Changes  Medications: Added new medication of PRAVACHOL 40 MG  TABS (PRAVASTATIN SODIUM) one tab daily - Signed Removed medication of LIPITOR 20 MG TABS (ATORVASTATIN CALCIUM) 1 once daily Rx of PRAVACHOL 40 MG  TABS (PRAVASTATIN SODIUM) one tab daily;  #90 x 3;  Signed;  Entered by: Sid Falcon LPN;  Authorized by: Gordy Savers  MD;  Method used: Electronic    Prescriptions: PRAVACHOL 40 MG  TABS (PRAVASTATIN SODIUM) one tab daily  #90 x 3   Entered by:   Sid Falcon LPN   Authorized by:   Gordy Savers  MD   Signed by:   Sid Falcon LPN on 09/81/1914   Method used:   [facility] to ...       [facility]  [address] [id number]       [address]       [place], [place]  [address]       Ph: [phone] or [phone]       Fax: [phone]   RxID:   [id number]   Unable to leave a message on pt home phone re: new RX. ..................................................................Marland KitchenSid Falcon LPN  September 30, 6385 1:18 PM  Called again, new generic Rx sent to pt pharmacy, no generic for the Vesicare, pt voiced her  understanding. ..................................................................Marland KitchenSid Falcon LPN  September 30, 5641 2:07 PM

## 2010-07-27 NOTE — Progress Notes (Signed)
Summary: RX REFILL  Phone Note Call from Patient Call back at [phone]   Caller: pt live Call For: K Summary of Call: PATIENT NEEDS RX REFILL FOR PRAVASTATIN SODIUM 40 MG, SHE HAS APPT ON 4-27 TO [facility] DR[facility]   [facility] [address] Initial call taken by: Celine Ahr,  October 01, 2008 11:18 AM      Prescriptions: PRAVASTATIN SODIUM 40 MG TABS (PRAVASTATIN SODIUM) 1 once daily  #90 x 6   Entered by:   Lynann Beaver CMA   Authorized by:   Gordy Savers  MD   Signed by:   Lynann Beaver CMA on 10/02/2008   Method used:   [facility]        [facility]  [address] [id number]       [address]       [place], [place]  [address]       Ph: [phone] or [phone]       Fax: [phone]   RxID:   [id number]

## 2010-07-27 NOTE — Assessment & Plan Note (Signed)
Summary: cpox/jls   Vital Signs:  Patient Profile:   65 Years Old Female Weight:      221 pounds Temp:     97.9 degrees F oral BP sitting:   132 / 80  (left arm)  Vitals Entered By: Raechel Ache, RN (December 17, 2007 8:52 AM)                 Chief Complaint:  OV and labs done. Marland Kitchen  History of Present Illness: 65 year old patient seen today for an annual exam.  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  She has a history of CLL hypertension, osteoarthritis, additional problems include exons obesity, and dyslipidemia.  She is followed by rheumatology for fibromyalgia.  In general, she seems to be doing well, and her status stable    Current Allergies: No known allergies   Past Medical History:    Reviewed history from 12/14/2006 and no changes required:       Hyperlipidemia       Hypertension       Lymphocytic leukemia       CNS sarcoid w/secondary gait disturbance, partial deafness       Fibromyalgia       Obesity       DJD  Past Surgical History:    Reviewed history from 12/14/2006 and no changes required:       Cholecystectomy       Tonsillectomy              colonoscopy June 2006   Family History:    Reviewed history from 06/12/2007 and no changes required:       father died age 22.  History of COPD, dementia       mother history of hypertension , osteoarthritis       One sister in good health  Social History:    Reviewed history and no changes required:       Married       Regular exercise-no       no children   Risk Factors:  Exercise:  no   Review of Systems       The patient complains of vision loss, peripheral edema, and difficulty walking.  The patient denies anorexia, fever, weight loss, chest pain, syncope, dyspnea on exertion, prolonged cough, headaches, abdominal pain, melena, hematochezia, severe indigestion/heartburn, hematuria, incontinence, genital sores, muscle weakness, suspicious skin lesions, transient blindness,  depression, unusual weight change, abnormal bleeding, enlarged lymph nodes, angioedema, and breast masses.     Physical Exam  General:     overweight-appearing.  blood pressure 140/70 Head:     Normocephalic and atraumatic without obvious abnormalities. No apparent alopecia or balding. Eyes:     No corneal or conjunctival inflammation noted. EOMI. Perrla. Funduscopic exam benign, without hemorrhages, exudates or papilledema. Vision grossly normal. Ears:     External ear exam shows no significant lesions or deformities.  Otoscopic examination reveals clear canals, tympanic membranes are intact bilaterally without bulging, retraction, inflammation or discharge. Hearing is grossly normal bilaterally. Nose:     External nasal examination shows no deformity or inflammation. Nasal mucosa are pink and moist without lesions or exudates. Mouth:     Oral mucosa and oropharynx without lesions or exudates.  Teeth in good repair. Neck:     No deformities, masses, or tenderness noted. Chest Wall:     No deformities, masses, or tenderness noted. Breasts:     No mass, nodules, thickening, tenderness, bulging, retraction, inflamation, nipple  discharge or skin changes noted.   Lungs:     Normal respiratory effort, chest expands symmetrically. Lungs are clear to auscultation, no crackles or wheezes. Heart:     Normal rate and regular rhythm. S1 and S2 normal without gallop, murmur, click, rub or other extra sounds. Abdomen:     obese soft and nontender.  No organomegaly Rectal:     No external abnormalities noted. Normal sphincter tone. No rectal masses or tenderness. Genitalia:     Normal introitus for age, no external lesions, no vaginal discharge, mucosa pink and moist, no vaginal  lesions, no vaginal atrophy, no friaility or hemorrhage, exam limited due to obesity Msk:     No deformity or scoliosis noted of thoracic or lumbar spine.   Pulses:     R and L carotid,radial,femoral,dorsalis pedis and  posterior tibial pulses are full and equal bilaterally Extremities:     No clubbing, cyanosis, edema, or deformity noted with normal full range of motion of all joints.   Neurologic:     abnormal gait.   generalized hyperreflexia    Impression & Recommendations:  Problem # 1:  FIBROMYALGIA (ICD-729.1)  Problem # 2:  DEGENERATIVE JOINT DISEASE (ICD-715.90)  The following medications were removed from the medication list:    Celebrex 200 Mg Caps (Celecoxib) .Marland Kitchen... Take 1 capsule by mouth twice a day  Her updated medication list for this problem includes:    Aspirin 81 Mg Tbec (Aspirin) .Marland Kitchen... Take 1 tablet by mouth once a day    Tramadol Hcl 50 Mg Tabs (Tramadol hcl) .Marland Kitchen... 1 q6h as needed    Nabumetone 500 Mg Tabs (Nabumetone) .Marland Kitchen... 1 two times a day   Problem # 3:  CLL (ICD-204.10)  Problem # 4:  HYPERTENSION (ICD-401.9)  Her updated medication list for this problem includes:    Hydrochlorothiazide 25 Mg Tabs (Hydrochlorothiazide) .Marland Kitchen... 1 once daily    Lisinopril 40 Mg Tabs (Lisinopril) .Marland Kitchen... Take 1 tablet by mouth once a day   Problem # 5:  HYPERLIPIDEMIA (ICD-272.4)  The following medications were removed from the medication list:    Pravachol 40 Mg Tabs (Pravastatin sodium) ..... One tab daily  Her updated medication list for this problem includes:    Pravastatin Sodium 80 Mg Tabs (Pravastatin sodium) ..... One daily   Complete Medication List: 1)  Aspirin 81 Mg Tbec (Aspirin) .... Take 1 tablet by mouth once a day 2)  Cyclobenzaprine Hcl 10 Mg Tabs (Cyclobenzaprine hcl) .... Take 1 tablet by mouth three times a day 3)  Flonase 50 Mcg/act Susp (Fluticasone propionate) .... Spray 2 spray into both nostrils once a day 4)  Hydrochlorothiazide 25 Mg Tabs (Hydrochlorothiazide) .Marland Kitchen.. 1 once daily 5)  Lisinopril 40 Mg Tabs (Lisinopril) .... Take 1 tablet by mouth once a day 6)  Prilosec 20 Mg Cpdr (Omeprazole) .... Take 1 capsule by mouth once a day 7)  Symax Duotab 0.375 Mg  Tbcr (Hyoscyamine sulfate) .... Take 1 tablet by mouth twice a day 8)  Vesicare 5 Mg Tabs (Solifenacin succinate) .... Take 1 tablet by mouth once a day 9)  Zyrtec Allergy 10 Mg Tabs (Cetirizine hcl) .Marland Kitchen.. 1 once daily as needed 10)  Tramadol Hcl 50 Mg Tabs (Tramadol hcl) .Marland Kitchen.. 1 q6h as needed 11)  Nabumetone 500 Mg Tabs (Nabumetone) .Marland Kitchen.. 1 two times a day 12)  Pravastatin Sodium 80 Mg Tabs (Pravastatin sodium) .... One daily  Other Orders: Pelvic & Breast Exam ( Medicare)  (J4782)   Patient  Instructions: 1)  Please schedule a follow-up appointment in 4 months. 2)  Limit your Sodium (Salt). 3)  It is important that you exercise regularly at least 20 minutes 5 times a week. If you develop chest pain, have severe difficulty breathing, or feel very tired , stop exercising immediately and seek medical attention. 4)  You need to lose weight. Consider a lower calorie diet and regular exercise.    Prescriptions: PRAVASTATIN SODIUM 80 MG  TABS (PRAVASTATIN SODIUM) one daily  #90 x 6   Entered and Authorized by:   Gordy Savers  MD   Signed by:   Gordy Savers  MD on 12/17/2007   Method used:   Print then Give to Patient   RxID:   [id number] NABUMETONE 500 MG  TABS (NABUMETONE) 1 two times a day  #180 x 6   Entered and Authorized by:   Gordy Savers  MD   Signed by:   Gordy Savers  MD on 12/17/2007   Method used:   Print then Give to Patient   RxID:   [id number] TRAMADOL HCL 50 MG  TABS (TRAMADOL HCL) 1 q6h as needed  #180 x 6   Entered and Authorized by:   Gordy Savers  MD   Signed by:   Gordy Savers  MD on 12/17/2007   Method used:   Print then Give to Patient   RxID:   [id number] ZYRTEC ALLERGY 10 MG  TABS (CETIRIZINE HCL) 1 once daily as needed  #90 x 6   Entered and Authorized by:   Gordy Savers  MD   Signed by:   Gordy Savers  MD on 12/17/2007   Method used:   Print then Give to Patient   RxID:    [id number] VESICARE 5 MG TABS (SOLIFENACIN SUCCINATE) Take 1 tablet by mouth once a day  #90 x 6   Entered and Authorized by:   Gordy Savers  MD   Signed by:   Gordy Savers  MD on 12/17/2007   Method used:   Print then Give to Patient   RxID:   [id number] SYMAX DUOTAB 0.375 MG TBCR (HYOSCYAMINE SULFATE) Take 1 tablet by mouth twice a day  #180 x 6   Entered and Authorized by:   Gordy Savers  MD   Signed by:   Gordy Savers  MD on 12/17/2007   Method used:   Print then Give to Patient   RxID:   [id number] PRILOSEC 20 MG CPDR (OMEPRAZOLE) Take 1 capsule by mouth once a day  #90 x 6   Entered and Authorized by:   Gordy Savers  MD   Signed by:   Gordy Savers  MD on 12/17/2007   Method used:   Print then Give to Patient   RxID:   [id number] LISINOPRIL 40 MG TABS (LISINOPRIL) Take 1 tablet by mouth once a day  #90 x 6   Entered and Authorized by:   Gordy Savers  MD   Signed by:   Gordy Savers  MD on 12/17/2007   Method used:   Print then Give to Patient   RxID:   [id number] HYDROCHLOROTHIAZIDE 25 MG TABS (HYDROCHLOROTHIAZIDE) 1 once daily  #90 x 6   Entered and Authorized by:   Gordy Savers  MD   Signed by:   Gordy Savers  MD on 12/17/2007   Method used:   Print then Give to  Patient   RxID:   [id number] FLONASE 50 MCG/ACT SUSP (FLUTICASONE PROPIONATE) Spray 2 spray into both nostrils once a day  #3 x 6   Entered and Authorized by:   Gordy Savers  MD   Signed by:   Gordy Savers  MD on 12/17/2007   Method used:   Print then Give to Patient   RxID:   [id number] CYCLOBENZAPRINE HCL 10 MG TABS (CYCLOBENZAPRINE HCL) Take 1 tablet by mouth three times a day  #180 x 6   Entered and Authorized by:   Gordy Savers  MD   Signed by:   Gordy Savers  MD on 12/17/2007   Method used:   Print then Give to Patient   RxID:   [id number]  ]

## 2010-07-27 NOTE — Consult Note (Signed)
Summary: Dr Clydie Braun note  Dr Clydie Braun note   Imported By: Kassie Mends 09/10/2007 10:11:19  _____________________________________________________________________  External Attachment:    Type:   Image     Comment:   Dr Clydie Braun note

## 2010-07-27 NOTE — Letter (Signed)
Summary: [facility]  [facility]   Imported By: Felipa Evener 10/26/2007 11:54:37  _____________________________________________________________________  External Attachment:    Type:   Image     Comment:   [facility]

## 2010-07-27 NOTE — Progress Notes (Signed)
Summary: List of Medications Provided by patient  List of Medications Provided by patient   Imported By: Maryln Gottron 02/18/2010 10:07:36  _____________________________________________________________________  External Attachment:    Type:   Image     Comment:   External Document

## 2010-08-05 ENCOUNTER — Other Ambulatory Visit: Payer: Self-pay | Admitting: Oncology

## 2010-08-05 ENCOUNTER — Encounter (HOSPITAL_BASED_OUTPATIENT_CLINIC_OR_DEPARTMENT_OTHER): Payer: Medicare Other | Admitting: Oncology

## 2010-08-05 DIAGNOSIS — C9110 Chronic lymphocytic leukemia of B-cell type not having achieved remission: Secondary | ICD-10-CM

## 2010-08-05 DIAGNOSIS — D869 Sarcoidosis, unspecified: Secondary | ICD-10-CM

## 2010-08-05 LAB — COMPREHENSIVE METABOLIC PANEL
ALT: 26 U/L (ref 0–35)
AST: 23 U/L (ref 0–37)
Albumin: 4.6 g/dL (ref 3.5–5.2)
Alkaline Phosphatase: 107 U/L (ref 39–117)
BUN: 22 mg/dL (ref 6–23)
CO2: 28 mEq/L (ref 19–32)
Calcium: 10.3 mg/dL (ref 8.4–10.5)
Chloride: 104 mEq/L (ref 96–112)
Creatinine, Ser: 1.08 mg/dL (ref 0.40–1.20)
Glucose, Bld: 101 mg/dL — ABNORMAL HIGH (ref 70–99)
Potassium: 4.4 mEq/L (ref 3.5–5.3)
Sodium: 144 mEq/L (ref 135–145)
Total Bilirubin: 0.3 mg/dL (ref 0.3–1.2)
Total Protein: 6.7 g/dL (ref 6.0–8.3)

## 2010-08-05 LAB — CBC WITH DIFFERENTIAL/PLATELET
BASO%: 0.2 % (ref 0.0–2.0)
Basophils Absolute: 0 10*3/uL (ref 0.0–0.1)
EOS%: 1 % (ref 0.0–7.0)
Eosinophils Absolute: 0.2 10*3/uL (ref 0.0–0.5)
HCT: 40.7 % (ref 34.8–46.6)
HGB: 13.4 g/dL (ref 11.6–15.9)
LYMPH%: 69.1 % — ABNORMAL HIGH (ref 14.0–49.7)
MCH: 28.9 pg (ref 25.1–34.0)
MCHC: 33 g/dL (ref 31.5–36.0)
MCV: 87.4 fL (ref 79.5–101.0)
MONO#: 0.7 10*3/uL (ref 0.1–0.9)
MONO%: 3.5 % (ref 0.0–14.0)
NEUT#: 5.5 10*3/uL (ref 1.5–6.5)
NEUT%: 26.2 % — ABNORMAL LOW (ref 38.4–76.8)
Platelets: 218 10*3/uL (ref 145–400)
RBC: 4.66 10*6/uL (ref 3.70–5.45)
RDW: 14.4 % (ref 11.2–14.5)
WBC: 21 10*3/uL — ABNORMAL HIGH (ref 3.9–10.3)
lymph#: 14.5 10*3/uL — ABNORMAL HIGH (ref 0.9–3.3)

## 2010-08-05 LAB — MORPHOLOGY: PLT EST: ADEQUATE

## 2010-08-05 LAB — CHCC SMEAR

## 2010-08-05 LAB — LACTATE DEHYDROGENASE: LDH: 197 U/L (ref 94–250)

## 2010-08-12 ENCOUNTER — Encounter (HOSPITAL_BASED_OUTPATIENT_CLINIC_OR_DEPARTMENT_OTHER): Payer: Medicare Other | Admitting: Oncology

## 2010-08-12 DIAGNOSIS — D869 Sarcoidosis, unspecified: Secondary | ICD-10-CM

## 2010-08-12 DIAGNOSIS — C9110 Chronic lymphocytic leukemia of B-cell type not having achieved remission: Secondary | ICD-10-CM

## 2010-08-16 ENCOUNTER — Encounter: Payer: Self-pay | Admitting: Internal Medicine

## 2010-08-17 ENCOUNTER — Encounter: Payer: Self-pay | Admitting: Internal Medicine

## 2010-08-17 ENCOUNTER — Ambulatory Visit (INDEPENDENT_AMBULATORY_CARE_PROVIDER_SITE_OTHER): Payer: Medicare Other | Admitting: Internal Medicine

## 2010-08-17 VITALS — BP 118/74 | HR 90 | Temp 98.1°F | Resp 16 | Ht 61.0 in | Wt 219.0 lb

## 2010-08-17 DIAGNOSIS — C9110 Chronic lymphocytic leukemia of B-cell type not having achieved remission: Secondary | ICD-10-CM

## 2010-08-17 DIAGNOSIS — M1990 Unspecified osteoarthritis, unspecified site: Secondary | ICD-10-CM

## 2010-08-17 DIAGNOSIS — I10 Essential (primary) hypertension: Secondary | ICD-10-CM

## 2010-08-17 DIAGNOSIS — E785 Hyperlipidemia, unspecified: Secondary | ICD-10-CM

## 2010-08-17 DIAGNOSIS — Z0000 Encounter for general adult medical examination without abnormal findings: Secondary | ICD-10-CM

## 2010-08-17 MED ORDER — HYDROCHLOROTHIAZIDE 25 MG PO TABS: 25.0000 mg | ORAL_TABLET | Freq: Every day | ORAL | Status: DC

## 2010-08-17 MED ORDER — PRAVASTATIN SODIUM 40 MG PO TABS: 40.0000 mg | ORAL_TABLET | Freq: Every day | ORAL | Status: DC

## 2010-08-17 MED ORDER — FLUTICASONE PROPIONATE 50 MCG/ACT NA SUSP: 2.0000 | Freq: Every day | NASAL | Status: DC

## 2010-08-17 MED ORDER — LISINOPRIL 40 MG PO TABS: 40.0000 mg | ORAL_TABLET | Freq: Every day | ORAL | Status: DC

## 2010-08-17 MED ORDER — SOLIFENACIN SUCCINATE 10 MG PO TABS: 5.0000 mg | ORAL_TABLET | Freq: Every day | ORAL | Status: DC

## 2010-08-17 NOTE — Patient Instructions (Signed)
Limit your sodium (Salt) intake  Please check your blood pressure on a regular basis.  If it is consistently greater than 150/90, please make an office appointment.  You need to lose weight.  Consider a lower calorie diet and regular exercise.  Return in 6 months for follow-up  

## 2010-08-17 NOTE — Progress Notes (Signed)
Subjective:    Patient ID: Sheryl [name], female    DOB: 1945-11-09, 65 y.o.   MRN: [id number]  HPI  65 year old patient who is seen today for a preventive examination.  History of Present Illness: 53 -year-old patient seen today for an annual exam.  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  She has a history of CLL hypertension, osteoarthritis, additional problems include exons obesity, and dyslipidemia.  She is followed by rheumatology for fibromyalgia.  In general, she seems to be doing well, and her status stable    Current Allergies: No known allergies   Past Medical History:    Reviewed history from 12/14/2006 and no changes required:       Hyperlipidemia       Hypertension       Lymphocytic leukemia       CNS sarcoid w/secondary gait disturbance, partial deafness       Fibromyalgia       Obesity       DJD  Past Surgical History:    Reviewed history from 12/14/2006 and no changes required:       Cholecystectomy       Tonsillectomy              colonoscopy June 2006   Family History:    Reviewed history from 06/12/2007 and no changes required:       father died age 73.  History of COPD, dementia       mother history of hypertension , osteoarthritis       One sister in good health  Social History:    Reviewed history and no changes required:       Married       Regular exercise-no       no children   Risk Factors:   Exercise:  no   Review of Systems        The patient complains of vision loss, peripheral edema, and difficulty walking.  The patient denies anorexia, fever, weight loss, chest pain, syncope, dyspnea on exertion, prolonged cough, headaches, abdominal pain, melena, hematochezia, severe indigestion/heartburn, hematuria, incontinence, genital sores, muscle weakness, suspicious skin lesions, transient blindness, depression, unusual weight change, abnormal bleeding, enlarged lymph nodes, angioedema, and breast masses.     1. Risk  factors, based on past  M,S,F history-  Cardiovascular risk factors include hypertension, and dyslipidemia  2.  Physical activities:Fairly sedentary due to her DJD, and fibromyalgia.  She has exogenous obesity 3.  Depression/mood:No history of depression or mood disorder  4.  Hearing: Mildly impaired  5.  ADL's:Independent in all aspects of daily living 6.  Fall risk:Moderate  7.  Home safety:No problems identified  8.  Height weight, and visual acuity;Height and weight are stable.  No change in visual acuity does have periodic eye examinations  9.  Counseling:Weight loss, exercise regimen, and heart healthy diet.  Discussed and encouraged  10. Lab orders based on risk factors:Laboratory profile, including TSH, and lipid profile will be reviewed  11. Referral Coordination:Follow-up rheumatology and hematology 12. Care plan:  Will continue present regimen prescriptions dispensed, weight loss encouraged  13. Cognitive assessment:       Review of Systems  Constitutional: Negative for fever, appetite change, fatigue and unexpected weight change.  HENT: Negative for hearing loss, ear pain, nosebleeds, congestion, sore throat, mouth sores, trouble swallowing, neck stiffness, dental problem, voice change, sinus pressure and tinnitus.   Eyes: Negative for photophobia, pain, redness and visual disturbance.  Respiratory: Negative  for cough, chest tightness and shortness of breath.   Cardiovascular: Negative for chest pain, palpitations and leg swelling.  Gastrointestinal: Negative for nausea, vomiting, abdominal pain, diarrhea, constipation, blood in stool, abdominal distention and rectal pain.  Genitourinary: Negative for dysuria, urgency, frequency, hematuria, flank pain, vaginal bleeding, vaginal discharge, difficulty urinating, genital sores, vaginal pain, menstrual problem and pelvic pain.  Musculoskeletal: Positive for arthralgias and gait problem. Negative for back pain.  Skin:  Negative for rash.  Neurological: Negative for dizziness, syncope, speech difficulty, weakness, light-headedness, numbness and headaches.  Hematological: Negative for adenopathy. Does not bruise/bleed easily.  Psychiatric/Behavioral: Negative for suicidal ideas, behavioral problems, self-injury, dysphoric mood and agitation. The patient is not nervous/anxious.        Objective:   Physical Exam  Constitutional: She is oriented to person, place, and time. She appears well-developed and well-nourished.       Moderately obese  HENT:  Head: Normocephalic and atraumatic.  Right Ear: External ear normal.  Left Ear: External ear normal.  Mouth/Throat: Oropharynx is clear and moist.  Eyes: Conjunctivae and EOM are normal.  Neck: Normal range of motion. Neck supple. No JVD present. No thyromegaly present.  Cardiovascular: Normal rate, regular rhythm, normal heart sounds and intact distal pulses.   No murmur heard. Pulmonary/Chest: Effort normal and breath sounds normal. She has no wheezes. She has no rales.  Abdominal: Soft. Bowel sounds are normal. She exhibits no distension and no mass. There is no tenderness. There is no rebound and no guarding.  Genitourinary: Vagina normal and uterus normal. Guaiac negative stool. No vaginal discharge found.  Musculoskeletal: Normal range of motion. She exhibits no edema and no tenderness.  Neurological: She is alert and oriented to person, place, and time. She has normal reflexes. No cranial nerve deficit. She exhibits normal muscle tone. Coordination normal.  Skin: Skin is warm and dry. No rash noted.  Psychiatric: She has a normal mood and affect. Her behavior is normal.          Assessment & Plan:  Unremarkable preventive health examination Exogenous obesity Fibromyalgia History of neurosarcoid Hypertension stable Chronic CLL stable-follow-up in hematology biannually  A mammogram was encouraged Return office visit 6 months

## 2010-09-16 ENCOUNTER — Encounter: Payer: Self-pay | Admitting: Internal Medicine

## 2010-09-23 NOTE — Letter (Signed)
Summary: Generic Letter  [facility]  [address] [place], [place] [address]   Phone: [phone]  Fax: [phone]    09/16/2010  Sheryl [name] [address] [place], [place]  [address]  [place]  Dear [name]:  The above-named individual is a patient followed in our internal medicine practice. She has multiple medical problems including partial blindness chronic pain hypertension as well as a history of leukemia.   This patient is not medically able to participate  as a juror.   Please consider excusing this individual from jury duty.           Sincerely,   [name]  MD

## 2010-10-07 ENCOUNTER — Other Ambulatory Visit: Payer: Self-pay | Admitting: Internal Medicine

## 2010-10-07 DIAGNOSIS — Z1231 Encounter for screening mammogram for malignant neoplasm of breast: Secondary | ICD-10-CM

## 2010-10-15 ENCOUNTER — Encounter: Payer: Self-pay | Admitting: Internal Medicine

## 2010-10-27 ENCOUNTER — Ambulatory Visit
Admission: RE | Admit: 2010-10-27 | Discharge: 2010-10-27 | Disposition: A | Discharge status: Home / Self Care | Payer: Medicare Other | Source: Ambulatory Visit | Attending: Internal Medicine | Admitting: Internal Medicine

## 2010-10-27 DIAGNOSIS — Z1231 Encounter for screening mammogram for malignant neoplasm of breast: Secondary | ICD-10-CM

## 2010-11-12 NOTE — Assessment & Plan Note (Signed)
[facility]                            [place] OFFICE NOTE   NAME:[name], Sheryl                   MRN:          [id number]  DATE:06/07/2006                            DOB:          October 07, 1945    Patient is a 65 year old female seen today for a comprehensive exam.  She has a history of CLL, and is followed closely by Hematology.  She  has hypertension, DJD, hyperlipidemia.  There is also a history of CNS  sarcoid complicated by gait disturbance, partial deafness.  She is  followed by Dr. Kellie Simmering for fibromyalgia.  She has exogenous obesity.  Medical regimen reviewed.   EXAMINATION:  Reveals an overweight white female, in no acute distress.  Blood pressure is low normal.  SKIN:  Negative.  Fundi, ears, nose and throat clear.  She does have a low hanging soft  palate.  CHEST:  Clear.  CARDIOVASCULAR EXAM:  Normal heart sounds.  ABDOMEN:  Obese, soft and non-tender.  No organomegaly.  PELVIC EXAM:  Unremarkable.  Limited due to obesity.  RECTAL EXAM:  Hemoccult negative.  EXTREMITIES:  Peripheral pulses.  No edema.  NEUROLOGIC:  Negative.   IMPRESSION:  1. Chronic lymphocytic leukemia.  2. Hypertension.  3. Hyperlipidemia.  4. History of central nervous system sarcoid with secondary gait      disturbance.   DISPOSITION:  1. Medical regimen unchanged.  2. Laboratory studies were reviewed; these were unremarkable.  3. Reassess in 4 months.     Gordy Savers, MD  Electronically Signed    PFK/MedQ  DD: 06/07/2006  DT: 06/07/2006  Job #: [id number]

## 2010-11-29 ENCOUNTER — Telehealth: Payer: Self-pay | Admitting: *Deleted

## 2010-11-29 MED ORDER — OXYBUTYNIN CHLORIDE ER 10 MG PO TB24: 10.0000 mg | ORAL_TABLET | Freq: Every day | ORAL | Status: DC

## 2010-11-29 NOTE — Telephone Encounter (Signed)
Oxybutynin 10 mg  #50 use daily   RF 4

## 2010-11-29 NOTE — Telephone Encounter (Signed)
Pt cannot afford the Vesicare anymore.  It has gone up to 280.00.  [employer] .

## 2011-02-10 ENCOUNTER — Other Ambulatory Visit: Payer: Self-pay | Admitting: Oncology

## 2011-02-10 ENCOUNTER — Encounter (HOSPITAL_BASED_OUTPATIENT_CLINIC_OR_DEPARTMENT_OTHER): Payer: Medicare Other | Admitting: Oncology

## 2011-02-10 DIAGNOSIS — D869 Sarcoidosis, unspecified: Secondary | ICD-10-CM

## 2011-02-10 DIAGNOSIS — C9110 Chronic lymphocytic leukemia of B-cell type not having achieved remission: Secondary | ICD-10-CM

## 2011-02-10 LAB — MORPHOLOGY
PLT EST: ADEQUATE
RBC Comments: NORMAL

## 2011-02-10 LAB — CBC WITH DIFFERENTIAL/PLATELET
BASO%: 0.3 % (ref 0.0–2.0)
Basophils Absolute: 0.1 10*3/uL (ref 0.0–0.1)
EOS%: 1.2 % (ref 0.0–7.0)
Eosinophils Absolute: 0.3 10*3/uL (ref 0.0–0.5)
HCT: 41.1 % (ref 34.8–46.6)
HGB: 13.8 g/dL (ref 11.6–15.9)
LYMPH%: 73.6 % — ABNORMAL HIGH (ref 14.0–49.7)
MCH: 29.4 pg (ref 25.1–34.0)
MCHC: 33.6 g/dL (ref 31.5–36.0)
MCV: 87.7 fL (ref 79.5–101.0)
MONO#: 0.9 10*3/uL (ref 0.1–0.9)
MONO%: 3.4 % (ref 0.0–14.0)
NEUT#: 5.7 10*3/uL (ref 1.5–6.5)
NEUT%: 21.5 % — ABNORMAL LOW (ref 38.4–76.8)
Platelets: 188 10*3/uL (ref 145–400)
RBC: 4.69 10*6/uL (ref 3.70–5.45)
RDW: 13.8 % (ref 11.2–14.5)
WBC: 26.4 10*3/uL — ABNORMAL HIGH (ref 3.9–10.3)
lymph#: 19.4 10*3/uL — ABNORMAL HIGH (ref 0.9–3.3)

## 2011-02-10 LAB — CHCC SMEAR

## 2011-02-15 ENCOUNTER — Encounter: Payer: Self-pay | Admitting: Internal Medicine

## 2011-02-15 ENCOUNTER — Ambulatory Visit (INDEPENDENT_AMBULATORY_CARE_PROVIDER_SITE_OTHER): Payer: Medicare Other | Admitting: Internal Medicine

## 2011-02-15 DIAGNOSIS — E669 Obesity, unspecified: Secondary | ICD-10-CM

## 2011-02-15 DIAGNOSIS — C9110 Chronic lymphocytic leukemia of B-cell type not having achieved remission: Secondary | ICD-10-CM

## 2011-02-15 DIAGNOSIS — I10 Essential (primary) hypertension: Secondary | ICD-10-CM

## 2011-02-15 DIAGNOSIS — E785 Hyperlipidemia, unspecified: Secondary | ICD-10-CM

## 2011-02-15 DIAGNOSIS — IMO0001 Reserved for inherently not codable concepts without codable children: Secondary | ICD-10-CM

## 2011-02-15 MED ORDER — OXYBUTYNIN CHLORIDE ER 10 MG PO TB24: 10.0000 mg | ORAL_TABLET | Freq: Every day | ORAL | Status: DC

## 2011-02-15 NOTE — Patient Instructions (Signed)
Limit your sodium (Salt) intake  You need to lose weight.  Consider a lower calorie diet and regular exercise.    It is important that you exercise regularly, at least 20 minutes 3 to 4 times per week.  If you develop chest pain or shortness of breath seek  medical attention.   

## 2011-02-15 NOTE — Progress Notes (Signed)
  Subjective:    Patient ID: Sheryl [name], female    DOB: 04/08/1946, 65 y.o.   MRN: [id number]  HPI 65 year old patient who is seen today for followup. She has a history of fibromyalgia and more recently has noted increasing pain. She also has a history of osteoarthritis. She has treated hypertension which has done quite well on diuretic therapy. She has a constantly sitting in a history of dyslipidemia. She remains on pravastatin which she continues to tolerate well. She is followed by oncology for stable CLL. No new concerns or complaints. Her weight is down 11 pounds.    Review of Systems  Constitutional: Negative.   HENT: Negative for hearing loss, congestion, sore throat, rhinorrhea, dental problem, sinus pressure and tinnitus.   Eyes: Negative for pain, discharge and visual disturbance.  Respiratory: Negative for cough and shortness of breath.   Cardiovascular: Negative for chest pain, palpitations and leg swelling.  Gastrointestinal: Negative for nausea, vomiting, abdominal pain, diarrhea, constipation, blood in stool and abdominal distention.  Genitourinary: Negative for dysuria, urgency, frequency, hematuria, flank pain, vaginal bleeding, vaginal discharge, difficulty urinating, vaginal pain and pelvic pain.  Musculoskeletal: Positive for myalgias and gait problem. Negative for joint swelling and arthralgias.  Skin: Negative for rash.  Neurological: Negative for dizziness, syncope, speech difficulty, weakness, numbness and headaches.  Hematological: Negative for adenopathy.  Psychiatric/Behavioral: Negative for behavioral problems, dysphoric mood and agitation. The patient is not nervous/anxious.        Objective:   Physical Exam  Constitutional: She is oriented to person, place, and time. She appears well-developed and well-nourished. No distress.       Blood pressure 120/80  HENT:  Head: Normocephalic.  Right Ear: External ear normal.  Left Ear: External ear normal.    Mouth/Throat: Oropharynx is clear and moist.  Eyes: Conjunctivae and EOM are normal. Pupils are equal, round, and reactive to light.  Neck: Normal range of motion. Neck supple. No thyromegaly present.  Cardiovascular: Normal rate, regular rhythm, normal heart sounds and intact distal pulses.   Pulmonary/Chest: Effort normal and breath sounds normal.  Abdominal: Soft. Bowel sounds are normal. She exhibits no mass. There is no tenderness.  Musculoskeletal: Normal range of motion.  Lymphadenopathy:    She has no cervical adenopathy.  Neurological: She is alert and oriented to person, place, and time.  Skin: Skin is warm and dry. No rash noted.  Psychiatric: She has a normal mood and affect. Her behavior is normal.          Assessment & Plan:   Fibromyalgia. We'll continue present regimen CLL stable Hypertension well controlled Osteoarthritis  Medications refilled. We'll see in 6 months for her annual physical additional weight loss encouraged as well as a restricted salt diet

## 2011-04-12 ENCOUNTER — Ambulatory Visit (INDEPENDENT_AMBULATORY_CARE_PROVIDER_SITE_OTHER): Payer: Medicare Other | Admitting: Internal Medicine

## 2011-04-12 ENCOUNTER — Encounter: Payer: Self-pay | Admitting: Internal Medicine

## 2011-04-12 VITALS — BP 114/70 | Temp 98.1°F | Wt 203.0 lb

## 2011-04-12 DIAGNOSIS — M1990 Unspecified osteoarthritis, unspecified site: Secondary | ICD-10-CM

## 2011-04-12 DIAGNOSIS — Z0000 Encounter for general adult medical examination without abnormal findings: Secondary | ICD-10-CM

## 2011-04-12 DIAGNOSIS — E669 Obesity, unspecified: Secondary | ICD-10-CM

## 2011-04-12 DIAGNOSIS — Z23 Encounter for immunization: Secondary | ICD-10-CM

## 2011-04-12 DIAGNOSIS — C9110 Chronic lymphocytic leukemia of B-cell type not having achieved remission: Secondary | ICD-10-CM

## 2011-04-12 DIAGNOSIS — I10 Essential (primary) hypertension: Secondary | ICD-10-CM

## 2011-04-12 DIAGNOSIS — E785 Hyperlipidemia, unspecified: Secondary | ICD-10-CM

## 2011-04-12 NOTE — Patient Instructions (Signed)
Take your antibiotic as prescribed until ALL of it is gone, but stop if you develop a rash, swelling, or any side effects of the medication.  Contact our office as soon as possible if  there are side effects of the medication.  Return in 4 months for follow-up  Please send for hospital records from [facility]

## 2011-04-12 NOTE — Progress Notes (Signed)
  Subjective:    Patient ID: Sheryl [name], female    DOB: Sep 04, 1945, 65 y.o.   MRN: [id number]  HPI  65 year old patient who is seen today for followup in post hospital discharge. She was admitted to  [facility] from October 3 through October 8 due to an acute febrile illness. She stated that she was treated for both a UTI with early sepsis as well as colitis she is completing antibiotic therapy that includes Omnicef metronidazole and also fluconazole. She is slightly weak but otherwise doing quite well she has been out of hospital for 63 days and improving daily. She has 2 more days of antibiotic therapy left. She has a history of CLL dyslipidemia obesity treated hypertension and osteoarthritis. She also has a history of fibromyalgia.    Review of Systems  Constitutional: Positive for diaphoresis.  HENT: Negative for hearing loss, congestion, sore throat, rhinorrhea, dental problem, sinus pressure and tinnitus.   Eyes: Negative for pain, discharge and visual disturbance.  Respiratory: Negative for cough and shortness of breath.   Cardiovascular: Negative for chest pain, palpitations and leg swelling.  Gastrointestinal: Negative for nausea, vomiting, abdominal pain, diarrhea, constipation, blood in stool and abdominal distention.  Genitourinary: Negative for dysuria, urgency, frequency, hematuria, flank pain, vaginal bleeding, vaginal discharge, difficulty urinating, vaginal pain and pelvic pain.  Musculoskeletal: Negative for joint swelling, arthralgias and gait problem.  Skin: Negative for rash.  Neurological: Positive for weakness. Negative for dizziness, syncope, speech difficulty, numbness and headaches.  Hematological: Negative for adenopathy.  Psychiatric/Behavioral: Negative for behavioral problems, dysphoric mood and agitation. The patient is not nervous/anxious.        Objective:   Physical Exam  Constitutional: She is oriented to person, place, and time. She appears  well-developed and well-nourished.  HENT:  Head: Normocephalic.  Right Ear: External ear normal.  Left Ear: External ear normal.  Mouth/Throat: Oropharynx is clear and moist.  Eyes: Conjunctivae and EOM are normal. Pupils are equal, round, and reactive to light.  Neck: Normal range of motion. Neck supple. No thyromegaly present.  Cardiovascular: Normal rate, regular rhythm, normal heart sounds and intact distal pulses.   Pulmonary/Chest: Effort normal and breath sounds normal.  Abdominal: Soft. Bowel sounds are normal. She exhibits no mass. There is no tenderness.  Musculoskeletal: Normal range of motion.  Lymphadenopathy:    She has no cervical adenopathy.  Neurological: She is alert and oriented to person, place, and time.  Skin: Skin is warm and dry. No rash noted.  Psychiatric: She has a normal mood and affect. Her behavior is normal.          Assessment & Plan:   Hypertension stable Status post acute febrile illness probably UTI with early urosepsis. Will obtain hospital records for review the patient will sign a release form today CLL Osteoarthritis Dyslipidemia Fibromyalgia  Medications were renewed. She does have an appointment in February. We'll reassess at that time or as needed. She will complete antibiotic therapy as prescribed. Hospital records reviewed

## 2011-06-14 ENCOUNTER — Telehealth: Payer: Self-pay | Admitting: *Deleted

## 2011-06-14 NOTE — Telephone Encounter (Signed)
returned patient phone call gave patient appointment for 07-2011

## 2011-08-02 ENCOUNTER — Other Ambulatory Visit (HOSPITAL_BASED_OUTPATIENT_CLINIC_OR_DEPARTMENT_OTHER): Payer: Medicare Other | Admitting: Lab

## 2011-08-02 DIAGNOSIS — D869 Sarcoidosis, unspecified: Secondary | ICD-10-CM | POA: Diagnosis not present

## 2011-08-02 DIAGNOSIS — C9110 Chronic lymphocytic leukemia of B-cell type not having achieved remission: Secondary | ICD-10-CM | POA: Diagnosis not present

## 2011-08-02 LAB — CBC WITH DIFFERENTIAL/PLATELET
BASO%: 0.2 % (ref 0.0–2.0)
Basophils Absolute: 0.1 10*3/uL (ref 0.0–0.1)
EOS%: 0.9 % (ref 0.0–7.0)
Eosinophils Absolute: 0.2 10*3/uL (ref 0.0–0.5)
HCT: 40.9 % (ref 34.8–46.6)
HGB: 13.9 g/dL (ref 11.6–15.9)
LYMPH%: 70.9 % — ABNORMAL HIGH (ref 14.0–49.7)
MCH: 29.4 pg (ref 25.1–34.0)
MCHC: 33.8 g/dL (ref 31.5–36.0)
MCV: 86.8 fL (ref 79.5–101.0)
MONO#: 0.6 10*3/uL (ref 0.1–0.9)
MONO%: 2.5 % (ref 0.0–14.0)
NEUT#: 6.4 10*3/uL (ref 1.5–6.5)
NEUT%: 25.5 % — ABNORMAL LOW (ref 38.4–76.8)
Platelets: 226 10*3/uL (ref 145–400)
RBC: 4.72 10*6/uL (ref 3.70–5.45)
RDW: 13.8 % (ref 11.2–14.5)
WBC: 25.1 10*3/uL — ABNORMAL HIGH (ref 3.9–10.3)
lymph#: 17.8 10*3/uL — ABNORMAL HIGH (ref 0.9–3.3)

## 2011-08-02 LAB — MORPHOLOGY
PLT EST: ADEQUATE
RBC Comments: NORMAL

## 2011-08-02 LAB — CHCC SMEAR

## 2011-08-03 LAB — COMPREHENSIVE METABOLIC PANEL
ALT: 22 U/L (ref 0–35)
AST: 21 U/L (ref 0–37)
Albumin: 4.7 g/dL (ref 3.5–5.2)
Alkaline Phosphatase: 114 U/L (ref 39–117)
BUN: 20 mg/dL (ref 6–23)
CO2: 25 mEq/L (ref 19–32)
Calcium: 9.9 mg/dL (ref 8.4–10.5)
Chloride: 104 mEq/L (ref 96–112)
Creatinine, Ser: 1.05 mg/dL (ref 0.50–1.10)
Glucose, Bld: 112 mg/dL — ABNORMAL HIGH (ref 70–99)
Potassium: 4.2 mEq/L (ref 3.5–5.3)
Sodium: 143 mEq/L (ref 135–145)
Total Bilirubin: 0.3 mg/dL (ref 0.3–1.2)
Total Protein: 6.7 g/dL (ref 6.0–8.3)

## 2011-08-03 LAB — LACTATE DEHYDROGENASE: LDH: 206 U/L (ref 94–250)

## 2011-08-03 LAB — BETA 2 MICROGLOBULIN, SERUM: Beta-2 Microglobulin: 2.42 mg/L — ABNORMAL HIGH (ref 1.01–1.73)

## 2011-08-08 ENCOUNTER — Ambulatory Visit: Payer: Medicare Other | Admitting: Physician Assistant

## 2011-08-09 ENCOUNTER — Ambulatory Visit: Payer: Medicare Other | Admitting: Oncology

## 2011-08-10 ENCOUNTER — Telehealth: Payer: Self-pay | Admitting: Oncology

## 2011-08-10 ENCOUNTER — Ambulatory Visit (HOSPITAL_BASED_OUTPATIENT_CLINIC_OR_DEPARTMENT_OTHER): Payer: Medicare Other | Admitting: Physician Assistant

## 2011-08-10 VITALS — BP 132/75 | HR 84 | Temp 97.8°F | Ht 61.0 in | Wt 206.6 lb

## 2011-08-10 DIAGNOSIS — C9110 Chronic lymphocytic leukemia of B-cell type not having achieved remission: Secondary | ICD-10-CM | POA: Diagnosis not present

## 2011-08-10 NOTE — Telephone Encounter (Signed)
gve the pt her aug,feb 2014 appt calendar °

## 2011-08-10 NOTE — Progress Notes (Signed)
Hematology and Oncology Follow Up Visit  Sheryl [name] [id number] 30-Jul-1945 66 y.o. 08/10/2011    HPI: 66 year old Sheryl [name], [name] with a history of: 1.  History of early stage CLL on observation. 2.  History of fibromyalgia. 3.  History of irritable bowel syndrome. 4.  History of neurosarcoid.  Interim History:   Sheryl [name] returns with her husband in accompaniment for followup of her early stage CLL, on observation alone with stable counts for several years. Since she was last seen one year ago, she states that she was doing fairly well all considered until October of 2012 when she was admitted to [name] with a "roaring" urinary tract infection and episode of colitis. She states she stay in the hospital for 5 days with part of his stay in the ICU. Excluding this, though she has not had trouble with recurrent infections, i.e. no sinusitis, skin infections, bronchitis, or other known urinary tract infections. No bleeding or bruising symptoms. She is unaware of any palpable lymphadenopathy. She denies any abdominal discomfort excluding her history of IBS. She feels well today.  A detailed review of systems is otherwise noncontributory as noted below.  Review of Systems: Constitutional:  feels well Eyes: no complaints ENT: hearing loss L ear, chronic Cardiovascular: no chest pain or dyspnea on exertion Respiratory: no cough, shortness of breath, or wheezing Neurological: no TIA or stroke symptoms Dermatological: negative Gastrointestinal: no abdominal pain, change in bowel habits, or black or bloody stools Genito-Urinary: positive for - 03/2011 hospitalization due to reported UTI/Colitis Hematological and Lymphatic: negative Breast: negative Musculoskeletal: negative Remaining ROS negative.   Medications:   I have reviewed the patient's current medications.  Current Outpatient Prescriptions  Medication Sig Dispense Refill  .  aspirin 81 MG tablet Take 81 mg by mouth daily.        . cefdinir (OMNICEF) 300 MG capsule Take 600 mg by mouth daily. Post hospital       . cetirizine (ZYRTEC) 10 MG tablet Take 10 mg by mouth daily.        . cyclobenzaprine (FLEXERIL) 10 MG tablet Take 10 mg by mouth 3 (three) times daily as needed.        . fluticasone (FLONASE) 50 MCG/ACT nasal spray 2 sprays by Nasal route daily.  16 g  6  . hydrochlorothiazide 25 MG tablet Take 1 tablet (25 mg total) by mouth daily.  90 tablet  6  . lisinopril (PRINIVIL,ZESTRIL) 40 MG tablet Take 1 tablet (40 mg total) by mouth daily.  90 tablet  6  . metroNIDAZOLE (FLAGYL) 500 MG tablet Take 500 mg by mouth 3 (three) times daily. Post hospital       . Multiple Vitamin (MULTIVITAMIN) tablet Take 1 tablet by mouth daily.        . nabumetone (RELAFEN) 500 MG tablet Take 500 mg by mouth 2 (two) times daily.        Marland Kitchen omeprazole (PRILOSEC) 20 MG capsule Take 20 mg by mouth daily.        Marland Kitchen oxybutynin (DITROPAN XL) 10 MG 24 hr tablet Take 1 tablet (10 mg total) by mouth daily.  90 tablet  6  . POTASSIUM GLUCONATE PO Take by mouth. otc - as  needed       . pravastatin (PRAVACHOL) 40 MG tablet Take 1 tablet (40 mg total) by mouth daily.  90 tablet  6  . traMADol (ULTRAM) 50 MG tablet Take 50 mg by mouth every 6 (  six) hours as needed.          Allergies: No Known Allergies  Physical Exam: Filed Vitals:   08/10/11 1332  BP: 132/75  Pulse: 84  Temp: 97.8 F (36.6 C)  Weight: 206 lbs. HEENT:  Sclerae anicteric, conjunctivae pink.  Oropharynx clear.  No mucositis or candidiasis.   Nodes:  No cervical, supraclavicular, or axillary lymphadenopathy palpated.  Breast Exam:  Deferred. Lungs:  Clear to auscultation bilaterally.  No crackles, rhonchi, or wheezes.   Heart:  Regular rate and rhythm.   Abdomen:  Soft, nontender.  Positive bowel sounds.  No organomegaly or masses palpated.   Musculoskeletal:  No focal spinal tenderness to palpation.  Extremities:   Benign.  No peripheral edema or cyanosis.   Skin:  Benign.   Neuro:  Nonfocal, alert and oriented x 3   Lab Results: Lab Results  Component Value Date   WBC 25.1* 08/02/2011   HGB 13.9 08/02/2011   HCT 40.9 08/02/2011   MCV 86.8 08/02/2011   PLT 226 08/02/2011   NEUTROABS 6.4 08/02/2011     Chemistry      Component Value Date/Time   NA 143 08/02/2011 1000   K 4.2 08/02/2011 1000   CL 104 08/02/2011 1000   CO2 25 08/02/2011 1000   BUN 20 08/02/2011 1000   CREATININE 1.05 08/02/2011 1000      Component Value Date/Time   CALCIUM 9.9 08/02/2011 1000   ALKPHOS 114 08/02/2011 1000   AST 21 08/02/2011 1000   ALT 22 08/02/2011 1000   BILITOT 0.3 08/02/2011 1000       Assessment:  66 year old Sheryl [name], [name] with a history of: 1.  History of early stage CLL on observation. 2.  History of fibromyalgia. 3.  History of irritable bowel syndrome. 4.  History of neurosarcoid. 5.  Hospitalization for UTI/Colitis in 03/2011  Case reviewed with Dr. Pierce Crane.  Plan:  We will continue to follow [name] on observation alone. We will plan on rechecking labs including a CBC, serum chemistry panel, and quantitative immunoglobulins in August of 2013. We will see her back officially in one years time repeating the above labs yet again, yet she knows we would be happy to see her in the interim if the need should arise.   This plan was reviewed with the patient, who voices understanding and agreement.  She knows to call with any changes or problems.    Keyleigh Manninen T, PA-C 08/10/2011

## 2011-08-18 ENCOUNTER — Encounter: Payer: Self-pay | Admitting: Internal Medicine

## 2011-08-18 ENCOUNTER — Ambulatory Visit (INDEPENDENT_AMBULATORY_CARE_PROVIDER_SITE_OTHER): Payer: Medicare Other | Admitting: Internal Medicine

## 2011-08-18 DIAGNOSIS — C9110 Chronic lymphocytic leukemia of B-cell type not having achieved remission: Secondary | ICD-10-CM | POA: Diagnosis not present

## 2011-08-18 DIAGNOSIS — IMO0001 Reserved for inherently not codable concepts without codable children: Secondary | ICD-10-CM | POA: Diagnosis not present

## 2011-08-18 DIAGNOSIS — I10 Essential (primary) hypertension: Secondary | ICD-10-CM

## 2011-08-18 DIAGNOSIS — E785 Hyperlipidemia, unspecified: Secondary | ICD-10-CM | POA: Diagnosis not present

## 2011-08-18 MED ORDER — OXYBUTYNIN CHLORIDE ER 10 MG PO TB24: 10.0000 mg | ORAL_TABLET | Freq: Every day | ORAL | Status: DC

## 2011-08-18 MED ORDER — CYCLOBENZAPRINE HCL 10 MG PO TABS: 10.0000 mg | ORAL_TABLET | Freq: Three times a day (TID) | ORAL | Status: DC | PRN

## 2011-08-18 MED ORDER — FLUTICASONE PROPIONATE 50 MCG/ACT NA SUSP: 2.0000 | Freq: Every day | NASAL | Status: DC

## 2011-08-18 MED ORDER — LISINOPRIL 40 MG PO TABS: 40.0000 mg | ORAL_TABLET | Freq: Every day | ORAL | Status: DC

## 2011-08-18 MED ORDER — NABUMETONE 500 MG PO TABS: 500.0000 mg | ORAL_TABLET | Freq: Two times a day (BID) | ORAL | Status: DC

## 2011-08-18 MED ORDER — PRAVASTATIN SODIUM 40 MG PO TABS: 40.0000 mg | ORAL_TABLET | Freq: Every day | ORAL | Status: DC

## 2011-08-18 MED ORDER — HYDROCHLOROTHIAZIDE 25 MG PO TABS: 25.0000 mg | ORAL_TABLET | Freq: Every day | ORAL | Status: DC

## 2011-08-18 MED ORDER — TRAMADOL HCL 50 MG PO TABS: 50.0000 mg | ORAL_TABLET | Freq: Four times a day (QID) | ORAL | Status: DC | PRN

## 2011-08-18 MED ORDER — OMEPRAZOLE 20 MG PO CPDR: 20.0000 mg | DELAYED_RELEASE_CAPSULE | Freq: Every day | ORAL | Status: DC

## 2011-08-18 NOTE — Progress Notes (Signed)
  Subjective:    Patient ID: Sheryl [name], female    DOB: 08/27/1945, 66 y.o.   MRN: [id number]  HPI  66 year old patient who is seen today for followup. She has a history of CLL and is followed by oncology. Her recent CBC was stable. She was hospitalized in [facility] in October of last year for apparent urosepsis. She also states that she had colitis. She has treated hypertension dyslipidemia exogenous obesity and also a history of fibromyalgia. She needs multiple medications refilled. She is doing quite well she has had a recent oncological visit.    Review of Systems  Constitutional: Negative.   HENT: Negative for hearing loss, congestion, sore throat, rhinorrhea, dental problem, sinus pressure and tinnitus.   Eyes: Negative for pain, discharge and visual disturbance.  Respiratory: Negative for cough and shortness of breath.   Cardiovascular: Negative for chest pain, palpitations and leg swelling.  Gastrointestinal: Negative for nausea, vomiting, abdominal pain, diarrhea, constipation, blood in stool and abdominal distention.  Genitourinary: Negative for dysuria, urgency, frequency, hematuria, flank pain, vaginal bleeding, vaginal discharge, difficulty urinating, vaginal pain and pelvic pain.  Musculoskeletal: Positive for gait problem. Negative for joint swelling and arthralgias.  Skin: Negative for rash.  Neurological: Negative for dizziness, syncope, speech difficulty, weakness, numbness and headaches.  Hematological: Negative for adenopathy.  Psychiatric/Behavioral: Negative for behavioral problems, dysphoric mood and agitation. The patient is not nervous/anxious.        Objective:   Physical Exam  Constitutional: She is oriented to person, place, and time. She appears well-developed and well-nourished.       Overweight. No distress. Walks with a cane.  HENT:  Head: Normocephalic.  Right Ear: External ear normal.  Left Ear: External ear normal.  Mouth/Throat: Oropharynx is  clear and moist.  Eyes: Conjunctivae and EOM are normal. Pupils are equal, round, and reactive to light.  Neck: Normal range of motion. Neck supple. No thyromegaly present.  Cardiovascular: Normal rate, regular rhythm, normal heart sounds and intact distal pulses.   Pulmonary/Chest: Effort normal and breath sounds normal.  Abdominal: Soft. Bowel sounds are normal. She exhibits no mass. There is no tenderness.  Musculoskeletal: Normal range of motion.  Lymphadenopathy:    She has no cervical adenopathy.  Neurological: She is alert and oriented to person, place, and time.  Skin: Skin is warm and dry. No rash noted.  Psychiatric: She has a normal mood and affect. Her behavior is normal.          Assessment & Plan:   Hypertension well controlled Dyslipidemia stable. We'll check a lipid panel next visit Posture arthritis fibromyalgia stable. Medications refilled CLL stable History of urosepsis.

## 2011-08-18 NOTE — Patient Instructions (Signed)
Limit your sodium (Salt) intake  You need to lose weight.  Consider a lower calorie diet and regular exercise.  Return in 6 months for follow-up   

## 2011-08-30 ENCOUNTER — Telehealth: Payer: Self-pay | Admitting: Internal Medicine

## 2011-08-30 NOTE — Telephone Encounter (Signed)
Pt went to pick up script for oxybutynin (DITROPAN XL) 10 MG 24 hr tablet, but was given omeprazole 20 mg caps. Pt says that she has never taken omeprazole before, unless it was a med that had been given to her in hospital. Pt said that she did not rcv the Oxybutynin med at all from pharmacy. [facility] [address] in [place].  Pt req call back from nurse.

## 2011-08-30 NOTE — Telephone Encounter (Signed)
Spoke with pt - on prilosec in past - now picked up omeprazole - ok , same thing. I spoke with [facility] - they do have rx for ditropan we sent 2/21.

## 2011-09-27 DIAGNOSIS — M25519 Pain in unspecified shoulder: Secondary | ICD-10-CM | POA: Diagnosis not present

## 2011-09-27 DIAGNOSIS — Z79899 Other long term (current) drug therapy: Secondary | ICD-10-CM | POA: Diagnosis not present

## 2011-09-27 DIAGNOSIS — IMO0001 Reserved for inherently not codable concepts without codable children: Secondary | ICD-10-CM | POA: Diagnosis not present

## 2011-09-28 DIAGNOSIS — IMO0001 Reserved for inherently not codable concepts without codable children: Secondary | ICD-10-CM | POA: Diagnosis not present

## 2011-09-28 DIAGNOSIS — M25519 Pain in unspecified shoulder: Secondary | ICD-10-CM | POA: Diagnosis not present

## 2011-09-28 DIAGNOSIS — Z79899 Other long term (current) drug therapy: Secondary | ICD-10-CM | POA: Diagnosis not present

## 2011-11-09 DIAGNOSIS — H53459 Other localized visual field defect, unspecified eye: Secondary | ICD-10-CM | POA: Diagnosis not present

## 2011-11-09 DIAGNOSIS — H4720 Unspecified optic atrophy: Secondary | ICD-10-CM | POA: Diagnosis not present

## 2011-11-09 DIAGNOSIS — D869 Sarcoidosis, unspecified: Secondary | ICD-10-CM | POA: Diagnosis not present

## 2011-11-09 DIAGNOSIS — H269 Unspecified cataract: Secondary | ICD-10-CM | POA: Diagnosis not present

## 2011-12-20 DIAGNOSIS — D869 Sarcoidosis, unspecified: Secondary | ICD-10-CM | POA: Diagnosis not present

## 2011-12-20 DIAGNOSIS — H538 Other visual disturbances: Secondary | ICD-10-CM | POA: Diagnosis not present

## 2011-12-20 DIAGNOSIS — H5460 Unqualified visual loss, one eye, unspecified: Secondary | ICD-10-CM | POA: Diagnosis not present

## 2011-12-20 DIAGNOSIS — H468 Other optic neuritis: Secondary | ICD-10-CM | POA: Diagnosis not present

## 2011-12-21 DIAGNOSIS — H469 Unspecified optic neuritis: Secondary | ICD-10-CM | POA: Diagnosis not present

## 2011-12-22 DIAGNOSIS — H469 Unspecified optic neuritis: Secondary | ICD-10-CM | POA: Diagnosis not present

## 2011-12-23 DIAGNOSIS — D869 Sarcoidosis, unspecified: Secondary | ICD-10-CM | POA: Diagnosis not present

## 2011-12-23 DIAGNOSIS — IMO0001 Reserved for inherently not codable concepts without codable children: Secondary | ICD-10-CM | POA: Diagnosis present

## 2011-12-23 DIAGNOSIS — R918 Other nonspecific abnormal finding of lung field: Secondary | ICD-10-CM | POA: Diagnosis not present

## 2011-12-23 DIAGNOSIS — G2581 Restless legs syndrome: Secondary | ICD-10-CM | POA: Diagnosis present

## 2011-12-23 DIAGNOSIS — C9110 Chronic lymphocytic leukemia of B-cell type not having achieved remission: Secondary | ICD-10-CM | POA: Diagnosis not present

## 2011-12-23 DIAGNOSIS — C9590 Leukemia, unspecified not having achieved remission: Secondary | ICD-10-CM | POA: Diagnosis not present

## 2011-12-23 DIAGNOSIS — H9190 Unspecified hearing loss, unspecified ear: Secondary | ICD-10-CM | POA: Diagnosis present

## 2011-12-23 DIAGNOSIS — G4730 Sleep apnea, unspecified: Secondary | ICD-10-CM | POA: Diagnosis present

## 2011-12-23 DIAGNOSIS — H469 Unspecified optic neuritis: Secondary | ICD-10-CM | POA: Diagnosis not present

## 2011-12-23 DIAGNOSIS — M5020 Other cervical disc displacement, unspecified cervical region: Secondary | ICD-10-CM | POA: Diagnosis not present

## 2011-12-23 DIAGNOSIS — M4802 Spinal stenosis, cervical region: Secondary | ICD-10-CM | POA: Diagnosis not present

## 2011-12-23 DIAGNOSIS — G589 Mononeuropathy, unspecified: Secondary | ICD-10-CM | POA: Diagnosis present

## 2011-12-23 DIAGNOSIS — K219 Gastro-esophageal reflux disease without esophagitis: Secondary | ICD-10-CM | POA: Diagnosis present

## 2011-12-23 DIAGNOSIS — H543 Unqualified visual loss, both eyes: Secondary | ICD-10-CM | POA: Diagnosis present

## 2011-12-23 DIAGNOSIS — C9591 Leukemia, unspecified, in remission: Secondary | ICD-10-CM | POA: Diagnosis present

## 2012-01-03 ENCOUNTER — Telehealth: Payer: Self-pay | Admitting: Internal Medicine

## 2012-01-03 DIAGNOSIS — H469 Unspecified optic neuritis: Secondary | ICD-10-CM | POA: Diagnosis not present

## 2012-01-03 NOTE — Telephone Encounter (Signed)
Caller: [place]/Patient; PCPKwiatkowski, Peter; CB#: [phone]; ; ; Call regarding Discharged From [facility] From 6/28-7/1; On Prednisone 100 mg x 8 days.  Concerned about blood sugar levels.  FSBG 180's to 210 while hospitalized; she was getting insulin injections for control. Appointment for 1015 on 7/10 w/ PCP per Diabetes:  Control Problems protocol.  .  At conclusion of call, she relates that the Opthalomolgist advised she needs Cat Scratch Fever test done.

## 2012-01-03 NOTE — Telephone Encounter (Signed)
rov tomarrow as scheduled

## 2012-01-03 NOTE — Telephone Encounter (Signed)
FYI appt tomorrow

## 2012-01-04 ENCOUNTER — Encounter: Payer: Self-pay | Admitting: Internal Medicine

## 2012-01-04 ENCOUNTER — Ambulatory Visit (INDEPENDENT_AMBULATORY_CARE_PROVIDER_SITE_OTHER): Payer: Medicare Other | Admitting: Internal Medicine

## 2012-01-04 VITALS — BP 140/90 | Temp 97.6°F | Wt 219.0 lb

## 2012-01-04 DIAGNOSIS — H468 Other optic neuritis: Secondary | ICD-10-CM

## 2012-01-04 DIAGNOSIS — D8689 Sarcoidosis of other sites: Secondary | ICD-10-CM | POA: Insufficient documentation

## 2012-01-04 DIAGNOSIS — C9110 Chronic lymphocytic leukemia of B-cell type not having achieved remission: Secondary | ICD-10-CM | POA: Diagnosis not present

## 2012-01-04 DIAGNOSIS — T380X5A Adverse effect of glucocorticoids and synthetic analogues, initial encounter: Secondary | ICD-10-CM

## 2012-01-04 DIAGNOSIS — R7302 Impaired glucose tolerance (oral): Secondary | ICD-10-CM

## 2012-01-04 DIAGNOSIS — R7309 Other abnormal glucose: Secondary | ICD-10-CM

## 2012-01-04 DIAGNOSIS — I10 Essential (primary) hypertension: Secondary | ICD-10-CM

## 2012-01-04 DIAGNOSIS — H469 Unspecified optic neuritis: Secondary | ICD-10-CM

## 2012-01-04 DIAGNOSIS — E139 Other specified diabetes mellitus without complications: Secondary | ICD-10-CM

## 2012-01-04 DIAGNOSIS — D869 Sarcoidosis, unspecified: Secondary | ICD-10-CM

## 2012-01-04 DIAGNOSIS — E099 Drug or chemical induced diabetes mellitus without complications: Secondary | ICD-10-CM | POA: Insufficient documentation

## 2012-01-04 HISTORY — DX: Unspecified optic neuritis: H46.9

## 2012-01-04 LAB — HEMOGLOBIN A1C: Hgb A1c MFr Bld: 6.4 % (ref 4.6–6.5)

## 2012-01-04 LAB — GLUCOSE, POCT (MANUAL RESULT ENTRY): POC Glucose: 117 mg/dl — AB (ref 70–99)

## 2012-01-04 MED ORDER — GLUCOSE BLOOD VI STRP: 1.0000 | ORAL_STRIP | Freq: Every day | Status: DC

## 2012-01-04 MED ORDER — FUROSEMIDE 40 MG PO TABS: 40.0000 mg | ORAL_TABLET | Freq: Every day | ORAL | Status: DC

## 2012-01-04 MED ORDER — POTASSIUM CHLORIDE CRYS ER 20 MEQ PO TBCR: 20.0000 meq | EXTENDED_RELEASE_TABLET | Freq: Every day | ORAL | Status: DC

## 2012-01-04 MED ORDER — FREESTYLE LANCETS MISC: 1.0000 | Freq: Every day | Status: DC

## 2012-01-04 NOTE — Patient Instructions (Signed)
Limit your sodium (Salt) intake  Please check your blood pressure on a regular basis.  If it is consistently greater than 150/90, please make an office appointment.   

## 2012-01-04 NOTE — Progress Notes (Signed)
  Subjective:    Patient ID: Sheryl [name], female    DOB: 12-28-1945, 66 y.o.   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 She presented with a right visual loss approximately 2-1/2 weeks ago. She was admitted at [facility] for right optic neuropathy and received parenteral steroids. She has a history of neurosarcoid and was admitted to the neurology service. Followup ophthalmology evaluation suggests improvement of the swollen optic nerve but she has had no improvement in her vision to this point.  Presently she is on a tapering dose of prednisone but still on 80 mg. She is worsening peripheral edema hypertension and some hyperglycemia The physicians have requested a hemoglobin A1c as well as a Bartonella titer    Review of Systems  Constitutional: Negative.   HENT: Negative for hearing loss, congestion, sore throat, rhinorrhea, dental problem, sinus pressure and tinnitus.   Eyes: Positive for visual disturbance. Negative for pain and discharge.  Respiratory: Negative for cough and shortness of breath.   Cardiovascular: Positive for leg swelling. Negative for chest pain and palpitations.  Gastrointestinal: Negative for nausea, vomiting, abdominal pain, diarrhea, constipation, blood in stool and abdominal distention.  Genitourinary: Negative for dysuria, urgency, frequency, hematuria, flank pain, vaginal bleeding, vaginal discharge, difficulty urinating, vaginal pain and pelvic pain.  Musculoskeletal: Positive for gait problem. Negative for joint swelling and arthralgias.  Skin: Negative for rash.  Neurological: Negative for dizziness, syncope, speech difficulty, weakness, numbness and headaches.  Hematological: Negative for adenopathy.  Psychiatric/Behavioral: Negative for behavioral problems, dysphoric mood and agitation. The patient is not nervous/anxious.        Objective:   Physical Exam  Constitutional: She is oriented to person,  place, and time. She appears well-developed and well-nourished.       BP 170/95  HENT:  Head: Normocephalic.  Right Ear: External ear normal.  Left Ear: External ear normal.  Mouth/Throat: Oropharynx is clear and moist.  Eyes: Conjunctivae and EOM are normal. Pupils are equal, round, and reactive to light.       R eye mid position and non reactive   Neck: Normal range of motion. Neck supple. No thyromegaly present.  Cardiovascular: Normal rate, regular rhythm, normal heart sounds and intact distal pulses.   Pulmonary/Chest: Effort normal and breath sounds normal.  Abdominal: Soft. Bowel sounds are normal. She exhibits no mass. There is no tenderness.  Musculoskeletal: Normal range of motion. She exhibits edema.       +3 edema  Lymphadenopathy:    She has no cervical adenopathy.  Neurological: She is alert and oriented to person, place, and time.  Skin: Skin is warm and dry. No rash noted.  Psychiatric: She has a normal mood and affect. Her behavior is normal.          Assessment & Plan:   Hypertension. Aggravated by volume expansion. Will place on furosemide and potassium supplementation Steroid associated stress hyperglycemia. We'll check a hemoglobin A1c. We'll dispense a home glucometer. Recheck in 2 weeks Operative neuropathy. Followup with ophthalmology. Continue prednisone taper

## 2012-01-09 DIAGNOSIS — IMO0001 Reserved for inherently not codable concepts without codable children: Secondary | ICD-10-CM | POA: Diagnosis not present

## 2012-01-09 DIAGNOSIS — R269 Unspecified abnormalities of gait and mobility: Secondary | ICD-10-CM | POA: Diagnosis not present

## 2012-01-11 LAB — BARTONELLA ANITBODY PANEL
B henselae IgG: NEGATIVE
B henselae IgM: NEGATIVE

## 2012-01-12 DIAGNOSIS — IMO0001 Reserved for inherently not codable concepts without codable children: Secondary | ICD-10-CM | POA: Diagnosis not present

## 2012-01-12 DIAGNOSIS — R269 Unspecified abnormalities of gait and mobility: Secondary | ICD-10-CM | POA: Diagnosis not present

## 2012-01-16 DIAGNOSIS — R269 Unspecified abnormalities of gait and mobility: Secondary | ICD-10-CM | POA: Diagnosis not present

## 2012-01-16 DIAGNOSIS — IMO0001 Reserved for inherently not codable concepts without codable children: Secondary | ICD-10-CM | POA: Diagnosis not present

## 2012-01-18 ENCOUNTER — Ambulatory Visit (INDEPENDENT_AMBULATORY_CARE_PROVIDER_SITE_OTHER): Payer: Medicare Other | Admitting: Internal Medicine

## 2012-01-18 ENCOUNTER — Encounter: Payer: Self-pay | Admitting: Internal Medicine

## 2012-01-18 VITALS — BP 124/70 | Temp 97.5°F | Wt 210.0 lb

## 2012-01-18 DIAGNOSIS — H469 Unspecified optic neuritis: Secondary | ICD-10-CM

## 2012-01-18 DIAGNOSIS — E139 Other specified diabetes mellitus without complications: Secondary | ICD-10-CM | POA: Diagnosis not present

## 2012-01-18 DIAGNOSIS — E099 Drug or chemical induced diabetes mellitus without complications: Secondary | ICD-10-CM

## 2012-01-18 DIAGNOSIS — H468 Other optic neuritis: Secondary | ICD-10-CM

## 2012-01-18 DIAGNOSIS — I10 Essential (primary) hypertension: Secondary | ICD-10-CM

## 2012-01-18 MED ORDER — FUROSEMIDE 40 MG PO TABS: ORAL_TABLET | ORAL | Status: DC

## 2012-01-18 NOTE — Progress Notes (Signed)
Subjective:    Patient ID: Sheryl [name], female    DOB: 08-31-1945, 66 y.o.   MRN: [id number]  HPI Wt Readings from Last 3 Encounters:  01/18/12 210 lb (95.255 kg)  01/04/12 219 lb (99.338 kg)  08/18/11 208 lb (94.86 kg)   66 year old patient who is seen today in followup. She has been on a high tapering dose of prednisone due to the right optic neuropathy and has had some steroid-induced hyperglycemia. This has much improved with dose reduction of the prednisone. She was seen 2 weeks ago and placed on furosemide due to hypertension and volume overload. Her weight is down 9 pounds in her peripheral edema has resolved blood pressure also is now in a low-normal range. She has been following blood sugars at home with fairly normal glycemic response. She feels quite well today. She is scheduled for ophthalmology followup next month  Past Medical History  Diagnosis Date  . 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 DEGENERATIVE JOINT DISEASE 12/14/2006  . EXOGENOUS OBESITY 12/14/2006  . FIBROMYALGIA 12/14/2006  . HYPERLIPIDEMIA 12/14/2006  . HYPERTENSION 12/14/2006  . Fibromyalgia   . Fibromyalgia   . Sarcoid   . Fibromyalgia     History   Social History  . Marital Status: Married    Spouse Name: N/A    Number of Children: N/A  . Years of Education: N/A   Occupational History  . Not on file.   Social History Main Topics  . Smoking status: Never Smoker   . Smokeless tobacco: Never Used  . Alcohol Use: No  . Drug Use: No  . Sexually Active: Not on file   Other Topics Concern  . Not on file   Social History Narrative  . No narrative on file    Past Surgical History  Procedure Date  . Cholecystectomy   . Tonsillectomy     Family History  Problem Relation Age of Onset  . Diabetes Sister     No Known Allergies  Current Outpatient Prescriptions on File Prior to Visit  Medication Sig Dispense Refill  . aspirin 81 MG tablet Take 81 mg by mouth daily.        . cetirizine (ZYRTEC) 10 MG  tablet Take 10 mg by mouth daily.        . cyclobenzaprine (FLEXERIL) 10 MG tablet Take 1 tablet (10 mg total) by mouth 3 (three) times daily as needed.  60 tablet  3  . fluticasone (FLONASE) 50 MCG/ACT nasal spray Place 2 sprays into the nose daily.  16 g  6  . furosemide (LASIX) 40 MG tablet Take 1 tablet (40 mg total) by mouth daily.  30 tablet  11  . glucose blood (FREESTYLE LITE) test strip 1 each by Other route daily. Use as instructed  100 each  12  . hydrochlorothiazide (HYDRODIURIL) 25 MG tablet Take 1 tablet (25 mg total) by mouth daily.  90 tablet  6  . Lancets (FREESTYLE) lancets 1 each by Other route daily. Use as instructed  100 each  12  . lisinopril (PRINIVIL,ZESTRIL) 40 MG tablet Take 1 tablet (40 mg total) by mouth daily.  90 tablet  6  . Multiple Vitamin (MULTIVITAMIN) tablet Take 1 tablet by mouth daily.        . nabumetone (RELAFEN) 500 MG tablet Take 1 tablet (500 mg total) by mouth 2 (two) times daily.  90 tablet  6  . omeprazole (PRILOSEC) 20 MG capsule Take 1 capsule (20 mg total) by mouth daily.  90  capsule  4  . oxybutynin (DITROPAN XL) 10 MG 24 hr tablet Take 1 tablet (10 mg total) by mouth daily.  90 tablet  6  . potassium chloride SA (K-DUR,KLOR-CON) 20 MEQ tablet Take 1 tablet (20 mEq total) by mouth daily.  30 tablet  3  . POTASSIUM GLUCONATE PO Take by mouth. otc - as  needed       . pravastatin (PRAVACHOL) 40 MG tablet Take 1 tablet (40 mg total) by mouth daily.  90 tablet  6  . predniSONE (DELTASONE) 20 MG tablet Tapering - current dose 80mg  qd - to 60mg  - to 40 mg to 20mg       . traMADol (ULTRAM) 50 MG tablet Take 1 tablet (50 mg total) by mouth every 6 (six) hours as needed.  90 tablet  4    BP 124/70  Temp 97.5 F (36.4 C) (Oral)  Wt 210 lb (95.255 kg)       Review of Systems  Constitutional: Negative.   HENT: Negative for hearing loss, congestion, sore throat, rhinorrhea, dental problem, sinus pressure and tinnitus.   Eyes: Negative for pain,  discharge and visual disturbance.  Respiratory: Negative for cough and shortness of breath.   Cardiovascular: Positive for leg swelling. Negative for chest pain and palpitations.  Gastrointestinal: Negative for nausea, vomiting, abdominal pain, diarrhea, constipation, blood in stool and abdominal distention.  Genitourinary: Negative for dysuria, urgency, frequency, hematuria, flank pain, vaginal bleeding, vaginal discharge, difficulty urinating, vaginal pain and pelvic pain.  Musculoskeletal: Negative for joint swelling, arthralgias and gait problem.  Skin: Negative for rash.  Neurological: Negative for dizziness, syncope, speech difficulty, weakness, numbness and headaches.  Hematological: Negative for adenopathy.  Psychiatric/Behavioral: Negative for behavioral problems, dysphoric mood and agitation. The patient is not nervous/anxious.        Objective:   Physical Exam  Constitutional: She is oriented to person, place, and time. She appears well-developed and well-nourished.       Blood pressure low normal  HENT:  Head: Normocephalic.  Right Ear: External ear normal.  Left Ear: External ear normal.  Mouth/Throat: Oropharynx is clear and moist.  Eyes: Conjunctivae and EOM are normal. Pupils are equal, round, and reactive to light.  Neck: Normal range of motion. Neck supple. No thyromegaly present.  Cardiovascular: Normal rate, regular rhythm, normal heart sounds and intact distal pulses.   Pulmonary/Chest: Effort normal and breath sounds normal.  Abdominal: Soft. Bowel sounds are normal. She exhibits no mass. There is no tenderness.  Musculoskeletal: Normal range of motion. She exhibits no edema.       Peripheral edema resolved  Lymphadenopathy:    She has no cervical adenopathy.  Neurological: She is alert and oriented to person, place, and time.  Skin: Skin is warm and dry. No rash noted.  Psychiatric: She has a normal mood and affect. Her behavior is normal.            Assessment & Plan:   Steroid induced hyperglycemia. Mild and well-controlled Hypertension resolved with diuresis  We'll recheck in 3 months  Will taper and discontinue furosemide after the prednisone has been completed.

## 2012-01-18 NOTE — Patient Instructions (Signed)
Limit your sodium (Salt) intake  Okay to taper and discontinue furosemide and potassium  Please check your blood pressure on a regular basis.  If it is consistently greater than 150/90, please make an office appointment.  Return in 3 months for follow-up

## 2012-01-19 ENCOUNTER — Telehealth: Payer: Self-pay

## 2012-01-19 DIAGNOSIS — R269 Unspecified abnormalities of gait and mobility: Secondary | ICD-10-CM | POA: Diagnosis not present

## 2012-01-19 DIAGNOSIS — IMO0001 Reserved for inherently not codable concepts without codable children: Secondary | ICD-10-CM | POA: Diagnosis not present

## 2012-01-19 MED ORDER — FUROSEMIDE 40 MG PO TABS: 20.0000 mg | ORAL_TABLET | Freq: Every day | ORAL | Status: DC

## 2012-01-19 NOTE — Telephone Encounter (Signed)
[facility]ax from [facility] about directions on lasix - I spoke with pt- she takes 1/2 tab qd - change to med list and new rx sent to [facility]

## 2012-01-23 DIAGNOSIS — IMO0001 Reserved for inherently not codable concepts without codable children: Secondary | ICD-10-CM | POA: Diagnosis not present

## 2012-01-23 DIAGNOSIS — R269 Unspecified abnormalities of gait and mobility: Secondary | ICD-10-CM | POA: Diagnosis not present

## 2012-01-26 DIAGNOSIS — R269 Unspecified abnormalities of gait and mobility: Secondary | ICD-10-CM | POA: Diagnosis not present

## 2012-01-26 DIAGNOSIS — IMO0001 Reserved for inherently not codable concepts without codable children: Secondary | ICD-10-CM | POA: Diagnosis not present

## 2012-01-30 DIAGNOSIS — IMO0001 Reserved for inherently not codable concepts without codable children: Secondary | ICD-10-CM | POA: Diagnosis not present

## 2012-01-30 DIAGNOSIS — R269 Unspecified abnormalities of gait and mobility: Secondary | ICD-10-CM | POA: Diagnosis not present

## 2012-01-31 DIAGNOSIS — H468 Other optic neuritis: Secondary | ICD-10-CM | POA: Diagnosis not present

## 2012-01-31 DIAGNOSIS — H359 Unspecified retinal disorder: Secondary | ICD-10-CM | POA: Diagnosis not present

## 2012-02-02 DIAGNOSIS — IMO0001 Reserved for inherently not codable concepts without codable children: Secondary | ICD-10-CM | POA: Diagnosis not present

## 2012-02-02 DIAGNOSIS — H4710 Unspecified papilledema: Secondary | ICD-10-CM | POA: Diagnosis not present

## 2012-02-02 DIAGNOSIS — R269 Unspecified abnormalities of gait and mobility: Secondary | ICD-10-CM | POA: Diagnosis not present

## 2012-02-06 DIAGNOSIS — R269 Unspecified abnormalities of gait and mobility: Secondary | ICD-10-CM | POA: Diagnosis not present

## 2012-02-06 DIAGNOSIS — IMO0001 Reserved for inherently not codable concepts without codable children: Secondary | ICD-10-CM | POA: Diagnosis not present

## 2012-02-09 DIAGNOSIS — R269 Unspecified abnormalities of gait and mobility: Secondary | ICD-10-CM | POA: Diagnosis not present

## 2012-02-09 DIAGNOSIS — IMO0001 Reserved for inherently not codable concepts without codable children: Secondary | ICD-10-CM | POA: Diagnosis not present

## 2012-02-10 ENCOUNTER — Other Ambulatory Visit (HOSPITAL_BASED_OUTPATIENT_CLINIC_OR_DEPARTMENT_OTHER): Payer: Medicare Other | Admitting: Lab

## 2012-02-10 DIAGNOSIS — C9110 Chronic lymphocytic leukemia of B-cell type not having achieved remission: Secondary | ICD-10-CM

## 2012-02-10 LAB — CBC WITH DIFFERENTIAL/PLATELET
BASO%: 0.2 % (ref 0.0–2.0)
Basophils Absolute: 0.1 10*3/uL (ref 0.0–0.1)
EOS%: 0.4 % (ref 0.0–7.0)
Eosinophils Absolute: 0.1 10*3/uL (ref 0.0–0.5)
HCT: 38.4 % (ref 34.8–46.6)
HGB: 12.7 g/dL (ref 11.6–15.9)
LYMPH%: 57 % — ABNORMAL HIGH (ref 14.0–49.7)
MCH: 29.2 pg (ref 25.1–34.0)
MCHC: 33 g/dL (ref 31.5–36.0)
MCV: 88.6 fL (ref 79.5–101.0)
MONO#: 0.6 10*3/uL (ref 0.1–0.9)
MONO%: 2.3 % (ref 0.0–14.0)
NEUT#: 9.9 10*3/uL — ABNORMAL HIGH (ref 1.5–6.5)
NEUT%: 40.1 % (ref 38.4–76.8)
Platelets: 234 10*3/uL (ref 145–400)
RBC: 4.34 10*6/uL (ref 3.70–5.45)
RDW: 16.2 % — ABNORMAL HIGH (ref 11.2–14.5)
WBC: 24.8 10*3/uL — ABNORMAL HIGH (ref 3.9–10.3)
lymph#: 14.1 10*3/uL — ABNORMAL HIGH (ref 0.9–3.3)

## 2012-02-10 LAB — TECHNOLOGIST REVIEW

## 2012-02-14 LAB — COMPREHENSIVE METABOLIC PANEL
ALT: 29 U/L (ref 0–35)
AST: 19 U/L (ref 0–37)
Albumin: 3.7 g/dL (ref 3.5–5.2)
Alkaline Phosphatase: 100 U/L (ref 39–117)
BUN: 23 mg/dL (ref 6–23)
CO2: 27 mEq/L (ref 19–32)
Calcium: 8.8 mg/dL (ref 8.4–10.5)
Chloride: 106 mEq/L (ref 96–112)
Creatinine, Ser: 1.07 mg/dL (ref 0.50–1.10)
Glucose, Bld: 140 mg/dL — ABNORMAL HIGH (ref 70–99)
Potassium: 4 mEq/L (ref 3.5–5.3)
Sodium: 143 mEq/L (ref 135–145)
Total Bilirubin: 0.3 mg/dL (ref 0.3–1.2)
Total Protein: 5.9 g/dL — ABNORMAL LOW (ref 6.0–8.3)

## 2012-02-14 LAB — IMMUNOFIXATION ELECTROPHORESIS
IgA: 74 mg/dL (ref 69–380)
IgG (Immunoglobin G), Serum: 328 mg/dL — ABNORMAL LOW (ref 690–1700)
IgM, Serum: 36 mg/dL — ABNORMAL LOW (ref 52–322)
Total Protein, Serum Electrophoresis: 5.9 g/dL — ABNORMAL LOW (ref 6.0–8.3)

## 2012-02-14 LAB — BETA 2 MICROGLOBULIN, SERUM: Beta-2 Microglobulin: 2.82 mg/L — ABNORMAL HIGH (ref 1.01–1.73)

## 2012-02-14 LAB — LACTATE DEHYDROGENASE: LDH: 301 U/L — ABNORMAL HIGH (ref 94–250)

## 2012-02-15 ENCOUNTER — Ambulatory Visit: Payer: Medicare Other | Admitting: Internal Medicine

## 2012-02-15 DIAGNOSIS — R269 Unspecified abnormalities of gait and mobility: Secondary | ICD-10-CM | POA: Diagnosis not present

## 2012-02-15 DIAGNOSIS — IMO0001 Reserved for inherently not codable concepts without codable children: Secondary | ICD-10-CM | POA: Diagnosis not present

## 2012-02-17 DIAGNOSIS — R269 Unspecified abnormalities of gait and mobility: Secondary | ICD-10-CM | POA: Diagnosis not present

## 2012-02-17 DIAGNOSIS — IMO0001 Reserved for inherently not codable concepts without codable children: Secondary | ICD-10-CM | POA: Diagnosis not present

## 2012-02-21 DIAGNOSIS — R0602 Shortness of breath: Secondary | ICD-10-CM | POA: Diagnosis not present

## 2012-02-21 DIAGNOSIS — R079 Chest pain, unspecified: Secondary | ICD-10-CM | POA: Diagnosis not present

## 2012-02-21 DIAGNOSIS — R918 Other nonspecific abnormal finding of lung field: Secondary | ICD-10-CM | POA: Diagnosis not present

## 2012-02-21 DIAGNOSIS — R5381 Other malaise: Secondary | ICD-10-CM | POA: Diagnosis not present

## 2012-02-21 DIAGNOSIS — D72829 Elevated white blood cell count, unspecified: Secondary | ICD-10-CM | POA: Diagnosis not present

## 2012-02-21 DIAGNOSIS — N390 Urinary tract infection, site not specified: Secondary | ICD-10-CM | POA: Diagnosis not present

## 2012-02-21 DIAGNOSIS — R059 Cough, unspecified: Secondary | ICD-10-CM | POA: Diagnosis not present

## 2012-02-21 DIAGNOSIS — R5383 Other fatigue: Secondary | ICD-10-CM | POA: Diagnosis not present

## 2012-02-21 DIAGNOSIS — J159 Unspecified bacterial pneumonia: Secondary | ICD-10-CM | POA: Diagnosis not present

## 2012-02-24 DIAGNOSIS — E860 Dehydration: Secondary | ICD-10-CM | POA: Diagnosis not present

## 2012-02-24 DIAGNOSIS — R0602 Shortness of breath: Secondary | ICD-10-CM | POA: Diagnosis not present

## 2012-02-24 DIAGNOSIS — D869 Sarcoidosis, unspecified: Secondary | ICD-10-CM | POA: Diagnosis not present

## 2012-02-24 DIAGNOSIS — J984 Other disorders of lung: Secondary | ICD-10-CM | POA: Diagnosis not present

## 2012-02-24 DIAGNOSIS — R5381 Other malaise: Secondary | ICD-10-CM | POA: Diagnosis not present

## 2012-03-01 ENCOUNTER — Encounter: Payer: Self-pay | Admitting: Internal Medicine

## 2012-03-01 ENCOUNTER — Ambulatory Visit (INDEPENDENT_AMBULATORY_CARE_PROVIDER_SITE_OTHER): Payer: Medicare Other | Admitting: Internal Medicine

## 2012-03-01 VITALS — BP 122/80 | Wt 208.0 lb

## 2012-03-01 DIAGNOSIS — M1990 Unspecified osteoarthritis, unspecified site: Secondary | ICD-10-CM

## 2012-03-01 DIAGNOSIS — I10 Essential (primary) hypertension: Secondary | ICD-10-CM | POA: Diagnosis not present

## 2012-03-01 DIAGNOSIS — C9110 Chronic lymphocytic leukemia of B-cell type not having achieved remission: Secondary | ICD-10-CM

## 2012-03-01 DIAGNOSIS — E785 Hyperlipidemia, unspecified: Secondary | ICD-10-CM

## 2012-03-01 DIAGNOSIS — E669 Obesity, unspecified: Secondary | ICD-10-CM

## 2012-03-01 DIAGNOSIS — IMO0001 Reserved for inherently not codable concepts without codable children: Secondary | ICD-10-CM

## 2012-03-01 DIAGNOSIS — E099 Drug or chemical induced diabetes mellitus without complications: Secondary | ICD-10-CM

## 2012-03-01 DIAGNOSIS — T380X5A Adverse effect of glucocorticoids and synthetic analogues, initial encounter: Secondary | ICD-10-CM

## 2012-03-01 DIAGNOSIS — E139 Other specified diabetes mellitus without complications: Secondary | ICD-10-CM

## 2012-03-01 NOTE — Progress Notes (Signed)
Subjective:    Patient ID: Sheryl [name], female    DOB: July 16, 1945, 66 y.o.   MRN: [id number]  HPI  66 year old patient who is seen today in followup. Stable medical problems include a history of CLL dyslipidemia and obesity. She has a history of treated hypertension osteoarthritis and also fibromyalgia. She also has a history of neurosarcoidosis and a history of right optic neuropathy. She also has a prior history of steroid associated diabetes. Recently she was seen [facility]. She presented with diffuse weakness both encounters. She was told initially that she had possible pneumonia and was treated with Cipro. The second episode she was diagnosed with the dehydration. Medical regimen included the a prednisone dose pack that she now has completed. She has had some associated lower extremity edema that is resolved by elevation. Evaluation included a chest CT to exclude presumably a pulmonary embolism. She continues to have this 9 exertion and fatigue but continues to improve Hospital records reviewed  Past Medical History  Diagnosis Date  . 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 DEGENERATIVE JOINT DISEASE 12/14/2006  . EXOGENOUS OBESITY 12/14/2006  . FIBROMYALGIA 12/14/2006  . HYPERLIPIDEMIA 12/14/2006  . HYPERTENSION 12/14/2006  . Fibromyalgia   . Fibromyalgia   . Sarcoid   . Fibromyalgia     History   Social History  . Marital Status: Married    Spouse Name: N/A    Number of Children: N/A  . Years of Education: N/A   Occupational History  . Not on file.   Social History Main Topics  . Smoking status: Never Smoker   . Smokeless tobacco: Never Used  . Alcohol Use: No  . Drug Use: No  . Sexually Active: Not on file   Other Topics Concern  . Not on file   Social History Narrative  . No narrative on file    Past Surgical History  Procedure Date  . Cholecystectomy   . Tonsillectomy     Family History  Problem Relation Age of Onset  . Diabetes Sister      No Known Allergies  Current Outpatient Prescriptions on File Prior to Visit  Medication Sig Dispense Refill  . aspirin 81 MG tablet Take 81 mg by mouth daily.        . cetirizine (ZYRTEC) 10 MG tablet Take 10 mg by mouth daily.        . cyclobenzaprine (FLEXERIL) 10 MG tablet Take 1 tablet (10 mg total) by mouth 3 (three) times daily as needed.  60 tablet  3  . fluticasone (FLONASE) 50 MCG/ACT nasal spray Place 2 sprays into the nose daily.  16 g  6  . furosemide (LASIX) 40 MG tablet Take 0.5 tablets (20 mg total) by mouth daily. One half tablet Monday Wednesday Friday  30 tablet  11  . glucose blood (FREESTYLE LITE) test strip 1 each by Other route daily. Use as instructed  100 each  12  . hydrochlorothiazide (HYDRODIURIL) 25 MG tablet Take 1 tablet (25 mg total) by mouth daily.  90 tablet  6  . Lancets (FREESTYLE) lancets 1 each by Other route daily. Use as instructed  100 each  12  . lisinopril (PRINIVIL,ZESTRIL) 40 MG tablet Take 1 tablet (40 mg total) by mouth daily.  90 tablet  6  . Multiple Vitamin (MULTIVITAMIN) tablet Take 1 tablet by mouth daily.        . nabumetone (RELAFEN) 500 MG tablet Take 1 tablet (500 mg total) by  mouth 2 (two) times daily.  90 tablet  6  . omeprazole (PRILOSEC) 20 MG capsule Take 1 capsule (20 mg total) by mouth daily.  90 capsule  4  . oxybutynin (DITROPAN XL) 10 MG 24 hr tablet Take 1 tablet (10 mg total) by mouth daily.  90 tablet  6  . potassium chloride SA (K-DUR,KLOR-CON) 20 MEQ tablet Take 1 tablet (20 mEq total) by mouth daily.  30 tablet  3  . POTASSIUM GLUCONATE PO Take by mouth. otc - as  needed       . pravastatin (PRAVACHOL) 40 MG tablet Take 1 tablet (40 mg total) by mouth daily.  90 tablet  6  . predniSONE (DELTASONE) 20 MG tablet Tapering - current dose 80mg  qd - to 60mg  - to 40 mg to 20mg       . traMADol (ULTRAM) 50 MG tablet Take 1 tablet (50 mg total) by mouth every 6 (six) hours as needed.  90 tablet  4    BP 122/80  Wt 208 lb  (94.348 kg)       Review of Systems  Constitutional: Positive for fatigue.  HENT: Negative for hearing loss, congestion, sore throat, rhinorrhea, dental problem, sinus pressure and tinnitus.   Eyes: Negative for pain, discharge and visual disturbance.  Respiratory: Positive for shortness of breath. Negative for cough.   Cardiovascular: Positive for leg swelling. Negative for chest pain and palpitations.  Gastrointestinal: Negative for nausea, vomiting, abdominal pain, diarrhea, constipation, blood in stool and abdominal distention.  Genitourinary: Negative for dysuria, urgency, frequency, hematuria, flank pain, vaginal bleeding, vaginal discharge, difficulty urinating, vaginal pain and pelvic pain.  Musculoskeletal: Negative for joint swelling, arthralgias and gait problem.  Skin: Negative for rash.  Neurological: Positive for weakness. Negative for dizziness, syncope, speech difficulty, numbness and headaches.  Hematological: Negative for adenopathy.  Psychiatric/Behavioral: Negative for behavioral problems, dysphoric mood and agitation. The patient is not nervous/anxious.        Objective:   Physical Exam  Constitutional: She is oriented to person, place, and time. She appears well-developed and well-nourished.       Weight 208. Blood pressure well controlled  HENT:  Head: Normocephalic.  Right Ear: External ear normal.  Left Ear: External ear normal.  Mouth/Throat: Oropharynx is clear and moist.  Eyes: Conjunctivae and EOM are normal. Pupils are equal, round, and reactive to light.  Neck: Normal range of motion. Neck supple. No thyromegaly present.  Cardiovascular: Normal rate, regular rhythm, normal heart sounds and intact distal pulses.   Pulmonary/Chest: Effort normal and breath sounds normal.       O2 saturation 97 Pulse rate 115 after transferring from a sitting position to the examining table  Abdominal: Soft. Bowel sounds are normal. She exhibits no mass. There is no  tenderness.  Musculoskeletal: Normal range of motion. She exhibits edema.  Lymphadenopathy:    She has no cervical adenopathy.  Neurological: She is alert and oriented to person, place, and time.  Skin: Skin is warm and dry. No rash noted.  Psychiatric: She has a normal mood and affect. Her behavior is normal.          Assessment & Plan:   Weakness. History of pneumonia and dehydration. She seems to be slowly improving we'll discontinue hydrochlorothiazide and observe; hopefully the peripheral edema will improve since prednisone has been completed Hypertension stable History of fibromyalgia Obesity/deconditioning. Physical therapy discussed and offered  Recheck 1 month

## 2012-03-01 NOTE — Patient Instructions (Signed)
Limit your sodium (Salt) intake    It is important that you exercise regularly, at least 20 minutes 3 to 4 times per week.  If you develop chest pain or shortness of breath seek  medical attention.  Consider referral for physical therapy  Return in one month for follow-up

## 2012-03-06 DIAGNOSIS — H468 Other optic neuritis: Secondary | ICD-10-CM | POA: Diagnosis not present

## 2012-03-06 DIAGNOSIS — H4720 Unspecified optic atrophy: Secondary | ICD-10-CM | POA: Diagnosis not present

## 2012-03-16 ENCOUNTER — Telehealth: Payer: Self-pay | Admitting: Internal Medicine

## 2012-03-16 ENCOUNTER — Encounter: Payer: Self-pay | Admitting: Internal Medicine

## 2012-03-16 ENCOUNTER — Ambulatory Visit (INDEPENDENT_AMBULATORY_CARE_PROVIDER_SITE_OTHER): Payer: Medicare Other | Admitting: Internal Medicine

## 2012-03-16 VITALS — BP 110/64 | Temp 98.0°F | Wt 206.0 lb

## 2012-03-16 DIAGNOSIS — I10 Essential (primary) hypertension: Secondary | ICD-10-CM | POA: Diagnosis not present

## 2012-03-16 DIAGNOSIS — R82998 Other abnormal findings in urine: Secondary | ICD-10-CM | POA: Diagnosis not present

## 2012-03-16 DIAGNOSIS — IMO0001 Reserved for inherently not codable concepts without codable children: Secondary | ICD-10-CM

## 2012-03-16 DIAGNOSIS — Z23 Encounter for immunization: Secondary | ICD-10-CM

## 2012-03-16 DIAGNOSIS — R109 Unspecified abdominal pain: Secondary | ICD-10-CM

## 2012-03-16 DIAGNOSIS — M549 Dorsalgia, unspecified: Secondary | ICD-10-CM

## 2012-03-16 DIAGNOSIS — R8290 Unspecified abnormal findings in urine: Secondary | ICD-10-CM

## 2012-03-16 LAB — POCT URINALYSIS DIPSTICK
Bilirubin, UA: NEGATIVE
Blood, UA: NEGATIVE
Glucose, UA: NEGATIVE
Ketones, UA: NEGATIVE
Nitrite, UA: NEGATIVE
Protein, UA: NEGATIVE
Spec Grav, UA: 1.015
Urobilinogen, UA: 0.2
pH, UA: 5

## 2012-03-16 NOTE — Telephone Encounter (Signed)
Caller: [place]/Patient; Patient Name: Sheryl [name]; PCP: [name] [facility]); Best Callback Phone Number: [phone]; Reason for call: Started with lower back pain on 03/15/12.  Urine dark yellow this morning, back feels achy- 2 UTI in past month and symptoms are the same. Hospitalized 5 days in Aug 2013 for Colitis and UTI- had foley catheter during hospital stay and UTI after discharge. Afebrile today-03/16/12. Triage and Care advice per Flank Pain protocol and appnt advised within 4 hours for "Flank pain or low back pain AND urinary tract symptoms" . Appointment scheduled at 1515 (next available) and Spoke with office RN who is going to check with Dr. Amador Cunas to see if she needs to be seen sooner.

## 2012-03-16 NOTE — Progress Notes (Signed)
Subjective:    Patient ID: Sheryl [name], female    DOB: 09-18-1945, 66 y.o.   MRN: [id number]  HPI  66 year old patient who is seen today for followup. She had onset of low-back pain yesterday and was concerned about a possible UTI. She felt that her urine was somewhat more concentrated earlier this morning. She denies any urinary frequency dysuria or urgency. No fever or hematuria. She has had prior UTIs that were associated with low back pain. Her back pain is aggravated by movement.   Past Medical History  Diagnosis Date  . 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 DEGENERATIVE JOINT DISEASE 12/14/2006  . EXOGENOUS OBESITY 12/14/2006  . FIBROMYALGIA 12/14/2006  . HYPERLIPIDEMIA 12/14/2006  . HYPERTENSION 12/14/2006  . Fibromyalgia   . Fibromyalgia   . Sarcoid   . Fibromyalgia     History   Social History  . Marital Status: Married    Spouse Name: N/A    Number of Children: N/A  . Years of Education: N/A   Occupational History  . Not on file.   Social History Main Topics  . Smoking status: Never Smoker   . Smokeless tobacco: Never Used  . Alcohol Use: No  . Drug Use: No  . Sexually Active: Not on file   Other Topics Concern  . Not on file   Social History Narrative  . No narrative on file    Past Surgical History  Procedure Date  . Cholecystectomy   . Tonsillectomy     Family History  Problem Relation Age of Onset  . Diabetes Sister     No Known Allergies  Current Outpatient Prescriptions on File Prior to Visit  Medication Sig Dispense Refill  . aspirin 81 MG tablet Take 81 mg by mouth daily.        . cetirizine (ZYRTEC) 10 MG tablet Take 10 mg by mouth daily.        . cyclobenzaprine (FLEXERIL) 10 MG tablet Take 1 tablet (10 mg total) by mouth 3 (three) times daily as needed.  60 tablet  3  . fluticasone (FLONASE) 50 MCG/ACT nasal spray Place 2 sprays into the nose daily.  16 g  6  . furosemide (LASIX) 40 MG tablet Take 0.5 tablets (20 mg total) by mouth daily. One half  tablet Monday Wednesday Friday  30 tablet  11  . glucose blood (FREESTYLE LITE) test strip 1 each by Other route daily. Use as instructed  100 each  12  . Lancets (FREESTYLE) lancets 1 each by Other route daily. Use as instructed  100 each  12  . lisinopril (PRINIVIL,ZESTRIL) 40 MG tablet Take 1 tablet (40 mg total) by mouth daily.  90 tablet  6  . Multiple Vitamin (MULTIVITAMIN) tablet Take 1 tablet by mouth daily.        . nabumetone (RELAFEN) 500 MG tablet Take 1 tablet (500 mg total) by mouth 2 (two) times daily.  90 tablet  6  . omeprazole (PRILOSEC) 20 MG capsule Take 1 capsule (20 mg total) by mouth daily.  90 capsule  4  . oxybutynin (DITROPAN XL) 10 MG 24 hr tablet Take 1 tablet (10 mg total) by mouth daily.  90 tablet  6  . pravastatin (PRAVACHOL) 40 MG tablet Take 1 tablet (40 mg total) by mouth daily.  90 tablet  6  . traMADol (ULTRAM) 50 MG tablet Take 1 tablet (50 mg total) by mouth every 6 (six) hours as needed.  90 tablet  4  BP 110/64  Temp 98 F (36.7 C) (Oral)  Wt 206 lb (93.441 kg)     Review of Systems  Genitourinary: Negative for dysuria, urgency, frequency, hematuria, flank pain and difficulty urinating.  Musculoskeletal: Positive for back pain and arthralgias.       Objective:   Physical Exam  Constitutional: She appears well-developed and well-nourished. No distress.  Musculoskeletal:       Musculature in the lumbar area tight and tense. No localized tenderness Straight leg testing negative but limited Range of motion the hips limited but did not aggravate the pain          Assessment & Plan:  Low back pain. Suspect musculoligamentous. Will continue muscle relaxers anti-inflammatories and tramado fibromyalgia Hypertension stable

## 2012-03-16 NOTE — Patient Instructions (Signed)
Most patients with low back pain will improve with time over the next two to 6 weeks.  Keep active but avoid any activities that cause pain.  Apply moist heat to the low back area several times daily.  Call or return to clinic prn if these symptoms worsen or fail to improve as anticipated.  

## 2012-03-16 NOTE — Telephone Encounter (Signed)
Kim advised that pt scheduled for 3:15

## 2012-04-05 DIAGNOSIS — H469 Unspecified optic neuritis: Secondary | ICD-10-CM | POA: Diagnosis not present

## 2012-04-05 DIAGNOSIS — R609 Edema, unspecified: Secondary | ICD-10-CM | POA: Diagnosis not present

## 2012-04-05 DIAGNOSIS — IMO0001 Reserved for inherently not codable concepts without codable children: Secondary | ICD-10-CM | POA: Diagnosis not present

## 2012-04-05 DIAGNOSIS — G959 Disease of spinal cord, unspecified: Secondary | ICD-10-CM | POA: Diagnosis not present

## 2012-04-05 DIAGNOSIS — D869 Sarcoidosis, unspecified: Secondary | ICD-10-CM | POA: Diagnosis not present

## 2012-04-05 DIAGNOSIS — M25579 Pain in unspecified ankle and joints of unspecified foot: Secondary | ICD-10-CM | POA: Diagnosis not present

## 2012-04-05 DIAGNOSIS — M4802 Spinal stenosis, cervical region: Secondary | ICD-10-CM | POA: Diagnosis not present

## 2012-04-17 ENCOUNTER — Other Ambulatory Visit: Payer: Self-pay | Admitting: Internal Medicine

## 2012-04-17 ENCOUNTER — Ambulatory Visit (INDEPENDENT_AMBULATORY_CARE_PROVIDER_SITE_OTHER): Payer: Medicare Other | Admitting: Internal Medicine

## 2012-04-17 ENCOUNTER — Encounter: Payer: Self-pay | Admitting: Internal Medicine

## 2012-04-17 VITALS — BP 118/80 | HR 82 | Temp 98.1°F | Resp 20 | Ht 60.0 in | Wt 207.0 lb

## 2012-04-17 DIAGNOSIS — R7309 Other abnormal glucose: Secondary | ICD-10-CM

## 2012-04-17 DIAGNOSIS — C9110 Chronic lymphocytic leukemia of B-cell type not having achieved remission: Secondary | ICD-10-CM

## 2012-04-17 DIAGNOSIS — E785 Hyperlipidemia, unspecified: Secondary | ICD-10-CM

## 2012-04-17 DIAGNOSIS — IMO0001 Reserved for inherently not codable concepts without codable children: Secondary | ICD-10-CM

## 2012-04-17 DIAGNOSIS — Z0000 Encounter for general adult medical examination without abnormal findings: Secondary | ICD-10-CM

## 2012-04-17 DIAGNOSIS — I10 Essential (primary) hypertension: Secondary | ICD-10-CM

## 2012-04-17 DIAGNOSIS — M4800 Spinal stenosis, site unspecified: Secondary | ICD-10-CM

## 2012-04-17 DIAGNOSIS — R7302 Impaired glucose tolerance (oral): Secondary | ICD-10-CM

## 2012-04-17 HISTORY — DX: Spinal stenosis, site unspecified: M48.00

## 2012-04-17 LAB — LDL CHOLESTEROL, DIRECT: Direct LDL: 164.3 mg/dL

## 2012-04-17 LAB — HEMOGLOBIN A1C: Hgb A1c MFr Bld: 5.7 % (ref 4.6–6.5)

## 2012-04-17 LAB — LIPID PANEL
Cholesterol: 242 mg/dL — ABNORMAL HIGH (ref 0–200)
HDL: 48.4 mg/dL (ref >39.00)
Total CHOL/HDL Ratio: 5
Triglycerides: 204 mg/dL — ABNORMAL HIGH (ref 0.0–149.0)
VLDL: 40.8 mg/dL — ABNORMAL HIGH (ref 0.0–40.0)

## 2012-04-17 MED ORDER — ATORVASTATIN CALCIUM 40 MG PO TABS: 40.0000 mg | ORAL_TABLET | Freq: Every day | ORAL | Status: DC

## 2012-04-17 NOTE — Progress Notes (Signed)
Subjective:    Patient ID: Sheryl [name], female    DOB: Jul 03, 1945, 66 y.o.   MRN: [id number]  HPI  66 year old patient who is seen today for a preventive health examination. 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 She has had a recent MRI that revealed significant cervical spinal stenosis. She has been referred to neurosurgery. She does complain of some occasional neck and shoulder discomfort but really has more issues with low back pain. She has a history of dyslipidemia and treated hypertension. She has stable CLL and also a history of osteoarthritis additionally she has obesity. She has a history of right optic neuropathy.  Current Allergies:  No known allergies   Past Medical History:  Reviewed history from 12/14/2006 and no changes required:  Hyperlipidemia  Hypertension  Lymphocytic leukemia  CNS sarcoid w/secondary gait disturbance, partial deafness  Fibromyalgia  Obesity  DJD   Past Surgical History:  Reviewed history from 12/14/2006 and no changes required:  Cholecystectomy  Tonsillectomy  colonoscopy June 2006   Family History:  Reviewed history from 06/12/2007 and no changes required:  father died age 18. History of COPD, dementia  mother history of hypertension , osteoarthritis  One sister in good health   Social History:  Reviewed history and no changes required:  Married  Regular exercise-no  no children   1. Risk factors, based on past  M,S,F history-  cardiovascular risk factors include hypertension and dyslipidemia  2.  Physical activities: Very sedentary do to obesity and osteoarthritis  3.  Depression/mood: No history depression or mood disorder  4.  Hearing: No deficits  5.  ADL's: Require some assistance with all aspects of daily living do to gait instability  6.  Fall risk: High due to gait instability  7.  Home safety: No problems identified  8.  Height weight, and visual acuity;  decreased visual acuity on the right do to optic neuropathy  9.  Counseling: Weight loss encouraged heart healthy diet encouraged  10. Lab orders based on risk factors: Will check a lipid profile  11. Referral : Followup the [facility] neurosurgery and locally with rheumatology  12. Care plan: Modest weight loss encouraged  13. Cognitive assessment: Alert and oriented with normal affect. No cognitive dysfunction  Past Medical History  Diagnosis Date  . 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 DEGENERATIVE JOINT DISEASE 12/14/2006  . EXOGENOUS OBESITY 12/14/2006  . FIBROMYALGIA 12/14/2006  . HYPERLIPIDEMIA 12/14/2006  . HYPERTENSION 12/14/2006  . Fibromyalgia   . Fibromyalgia   . Sarcoid   . Fibromyalgia     History   Social History  . Marital Status: Married    Spouse Name: N/A    Number of Children: N/A  . Years of Education: N/A   Occupational History  . Not on file.   Social History Main Topics  . Smoking status: Never Smoker   . Smokeless tobacco: Never Used  . Alcohol Use: No  . Drug Use: No  . Sexually Active: Not on file   Other Topics Concern  . Not on file   Social History Narrative  . No narrative on file    Past Surgical History  Procedure Date  . Cholecystectomy   . Tonsillectomy     Family History  Problem Relation Age of Onset  . Diabetes Sister     No Known Allergies  Current Outpatient Prescriptions on File Prior to Visit  Medication Sig Dispense Refill  . aspirin 81 MG  tablet Take 81 mg by mouth daily.        . cetirizine (ZYRTEC) 10 MG tablet Take 10 mg by mouth daily.        . cyclobenzaprine (FLEXERIL) 10 MG tablet Take 1 tablet (10 mg total) by mouth 3 (three) times daily as needed.  60 tablet  3  . glucose blood (FREESTYLE LITE) test strip 1 each by Other route daily. Use as instructed  100 each  12  . Lancets (FREESTYLE) lancets 1 each by Other route daily. Use as instructed  100 each  12  . lisinopril (PRINIVIL,ZESTRIL) 40 MG tablet Take 1 tablet  (40 mg total) by mouth daily.  90 tablet  6  . Multiple Vitamin (MULTIVITAMIN) tablet Take 1 tablet by mouth daily.        . nabumetone (RELAFEN) 500 MG tablet Take 1 tablet (500 mg total) by mouth 2 (two) times daily.  90 tablet  6  . omeprazole (PRILOSEC) 20 MG capsule Take 1 capsule (20 mg total) by mouth daily.  90 capsule  4  . oxybutynin (DITROPAN XL) 10 MG 24 hr tablet Take 1 tablet (10 mg total) by mouth daily.  90 tablet  6  . pravastatin (PRAVACHOL) 40 MG tablet Take 1 tablet (40 mg total) by mouth daily.  90 tablet  6  . traMADol (ULTRAM) 50 MG tablet Take 1 tablet (50 mg total) by mouth every 6 (six) hours as needed.  90 tablet  4  . fluticasone (FLONASE) 50 MCG/ACT nasal spray Place 2 sprays into the nose daily.  16 g  6    BP 118/80  Pulse 82  Temp 98.1 F (36.7 C) (Oral)  Resp 20  Ht 5' (1.524 m)  Wt 207 lb (93.895 kg)  BMI 40.43 kg/m2       Review of Systems  Constitutional: Negative for fever, appetite change, fatigue and unexpected weight change.  HENT: Negative for hearing loss, ear pain, nosebleeds, congestion, sore throat, mouth sores, trouble swallowing, neck stiffness, dental problem, voice change, sinus pressure and tinnitus.   Eyes: Positive for visual disturbance. Negative for photophobia, pain and redness.  Respiratory: Negative for cough, chest tightness and shortness of breath.   Cardiovascular: Negative for chest pain, palpitations and leg swelling.  Gastrointestinal: Negative for nausea, vomiting, abdominal pain, diarrhea, constipation, blood in stool, abdominal distention and rectal pain.  Genitourinary: Negative for dysuria, urgency, frequency, hematuria, flank pain, vaginal bleeding, vaginal discharge, difficulty urinating, genital sores, vaginal pain, menstrual problem and pelvic pain.  Musculoskeletal: Positive for back pain, arthralgias and gait problem.  Skin: Negative for rash.  Neurological: Negative for dizziness, syncope, speech difficulty,  weakness, light-headedness, numbness and headaches.  Hematological: Negative for adenopathy. Does not bruise/bleed easily.  Psychiatric/Behavioral: Negative for suicidal ideas, behavioral problems, self-injury, dysphoric mood and agitation. The patient is not nervous/anxious.        Objective:   Physical Exam  Constitutional: She is oriented to person, place, and time. She appears well-developed and well-nourished.  HENT:  Head: Normocephalic and atraumatic.  Right Ear: External ear normal.  Left Ear: External ear normal.  Mouth/Throat: Oropharynx is clear and moist.       Minimal cerumen in both canals  Pharyngeal crowding with low hanging soft palate  Eyes: Conjunctivae normal and EOM are normal.  Neck: Normal range of motion. Neck supple. No JVD present. No thyromegaly present.  Cardiovascular: Normal rate, regular rhythm, normal heart sounds and intact distal pulses.   No murmur  heard. Pulmonary/Chest: Effort normal and breath sounds normal. She has no wheezes. She has no rales.  Abdominal: Soft. Bowel sounds are normal. She exhibits no distension and no mass. There is no tenderness. There is no rebound and no guarding.  Genitourinary: Vagina normal.  Musculoskeletal: Normal range of motion. She exhibits no edema and no tenderness.  Neurological: She is alert and oriented to person, place, and time. She displays abnormal reflex. No cranial nerve deficit. She exhibits normal muscle tone. Coordination normal.       Reflexes were hyperreflexic with ankle clonus  Skin: Skin is warm and dry. No rash noted.  Psychiatric: She has a normal mood and affect. Her behavior is normal.          Assessment & Plan:   Preventive health examination Spinal stenosis. Followup at [facility] neurosurgery Fibromyalgia History of neurosarcoidosis Hypertension stable Dyslipidemia. We'll check a lipid profile  Impaired glucose tolerance. We'll check a hemoglobin A1c  Recheck 6 months

## 2012-04-17 NOTE — Progress Notes (Signed)
Quick Note:  Attempt to call- both hm and cell # - VM at both - LMTCB need to discuss lab and change to med to be done, ______

## 2012-04-17 NOTE — Progress Notes (Signed)
Quick Note:  Spoke with pt- informed of lab and new med to start - [facility] ______

## 2012-04-17 NOTE — Patient Instructions (Signed)
Limit your sodium (Salt) intake    It is important that you exercise regularly, at least 20 minutes 3 to 4 times per week.  If you develop chest pain or shortness of breath seek  medical attention.  You need to lose weight.  Consider a lower calorie diet and regular exercise.  Return in 6 months for follow-up   

## 2012-04-25 DIAGNOSIS — M47812 Spondylosis without myelopathy or radiculopathy, cervical region: Secondary | ICD-10-CM | POA: Diagnosis not present

## 2012-04-25 DIAGNOSIS — M4802 Spinal stenosis, cervical region: Secondary | ICD-10-CM | POA: Diagnosis not present

## 2012-05-07 ENCOUNTER — Other Ambulatory Visit: Payer: Self-pay | Admitting: Internal Medicine

## 2012-05-07 MED ORDER — SOLIFENACIN SUCCINATE 10 MG PO TABS: 10.0000 mg | ORAL_TABLET | Freq: Every day | ORAL | Status: DC

## 2012-05-07 NOTE — Telephone Encounter (Signed)
Medication changed and Rx ordered.

## 2012-05-07 NOTE — Telephone Encounter (Signed)
vesicare 10  #90  One daily  RF 4

## 2012-05-07 NOTE — Telephone Encounter (Signed)
Pt states the oxybutynim is NOT working for her and she would like to go back on the [employer]. Pharm [phone]

## 2012-05-08 ENCOUNTER — Telehealth: Payer: Self-pay | Admitting: Internal Medicine

## 2012-05-08 MED ORDER — SOLIFENACIN SUCCINATE 10 MG PO TABS: 10.0000 mg | ORAL_TABLET | Freq: Every day | ORAL | Status: DC

## 2012-05-08 NOTE — Telephone Encounter (Signed)
vesicare 10 mg  #90  rf4

## 2012-05-08 NOTE — Telephone Encounter (Signed)
Med filled.  

## 2012-05-08 NOTE — Telephone Encounter (Signed)
Caller: [name]/Patient; Patient Name: Sheryl [name]; PCP: [name] [facility]); Best Callback Phone Number: [phone], currently taking  oxybutynin for urinary frequency/incontinence, not helping, wants to go back to [place], uses [employer], [employer], phone number [phone], wants refills through April 2014.  RN advised patient that note will be sent to office and office will follow up.  Pt voiced understanding.

## 2012-05-25 ENCOUNTER — Telehealth: Payer: Self-pay | Admitting: *Deleted

## 2012-05-25 NOTE — Telephone Encounter (Signed)
Mailed out calendar to inform the patient of the new date and time due to the md on vac

## 2012-06-05 DIAGNOSIS — IMO0001 Reserved for inherently not codable concepts without codable children: Secondary | ICD-10-CM | POA: Diagnosis not present

## 2012-06-05 DIAGNOSIS — M25579 Pain in unspecified ankle and joints of unspecified foot: Secondary | ICD-10-CM | POA: Diagnosis not present

## 2012-06-05 DIAGNOSIS — R609 Edema, unspecified: Secondary | ICD-10-CM | POA: Diagnosis not present

## 2012-06-12 DIAGNOSIS — H468 Other optic neuritis: Secondary | ICD-10-CM | POA: Diagnosis not present

## 2012-07-04 DIAGNOSIS — M4712 Other spondylosis with myelopathy, cervical region: Secondary | ICD-10-CM | POA: Diagnosis not present

## 2012-07-04 DIAGNOSIS — M5030 Other cervical disc degeneration, unspecified cervical region: Secondary | ICD-10-CM | POA: Diagnosis not present

## 2012-07-04 DIAGNOSIS — M4802 Spinal stenosis, cervical region: Secondary | ICD-10-CM | POA: Diagnosis not present

## 2012-07-04 DIAGNOSIS — M542 Cervicalgia: Secondary | ICD-10-CM | POA: Diagnosis not present

## 2012-07-11 DIAGNOSIS — H5340 Unspecified visual field defects: Secondary | ICD-10-CM | POA: Diagnosis not present

## 2012-07-12 ENCOUNTER — Other Ambulatory Visit: Payer: Self-pay | Admitting: Orthopaedic Surgery

## 2012-07-12 DIAGNOSIS — M545 Low back pain, unspecified: Secondary | ICD-10-CM

## 2012-07-20 ENCOUNTER — Ambulatory Visit
Admission: RE | Admit: 2012-07-20 | Discharge: 2012-07-20 | Disposition: A | Discharge status: Home / Self Care | Payer: Medicare Other | Source: Ambulatory Visit | Attending: Orthopaedic Surgery | Admitting: Orthopaedic Surgery

## 2012-07-20 DIAGNOSIS — M545 Low back pain, unspecified: Secondary | ICD-10-CM

## 2012-07-20 DIAGNOSIS — M4310 Spondylolisthesis, site unspecified: Secondary | ICD-10-CM | POA: Diagnosis not present

## 2012-07-20 DIAGNOSIS — M5126 Other intervertebral disc displacement, lumbar region: Secondary | ICD-10-CM | POA: Diagnosis not present

## 2012-07-20 DIAGNOSIS — M48061 Spinal stenosis, lumbar region without neurogenic claudication: Secondary | ICD-10-CM | POA: Diagnosis not present

## 2012-07-23 ENCOUNTER — Telehealth: Payer: Self-pay | Admitting: Internal Medicine

## 2012-07-23 NOTE — Telephone Encounter (Signed)
Patient Information:  Caller Name: [place]  Phone: [phone]  Patient: Sheryl [name], Sheryl [name]  Gender: Female  DOB: Dec 08, 1945  Age: 67 Years  PCP: [name] [facility])  Office Follow Up:  Does the office need to follow up with this patient?: No  Instructions For The Office: N/A   Symptoms  Reason For Call & Symptoms: States she is having lower back surgery on 07/25/12.  Asked if Nabumetone thins the blood.  Information researched in [employer].  Gave her patient instructions and advised it is NSAID, not used as anticoagulant but may cause bleeding particularly in digestive system due to irritation. She states she will discuss with surgeon as to  whether or not he wants her to take it after today.  Reviewed Health History In EMR: Yes  Reviewed Medications In EMR: Yes  Reviewed Allergies In EMR: Yes  Reviewed Surgeries / Procedures: Yes  Date of Onset of Symptoms: Unknown  Guideline(s) Used:  No Protocol Available - Information Only  Disposition Per Guideline:   Home Care  Reason For Disposition Reached:   Information only question and nurse able to answer  Advice Given:  Call Back If:  New symptoms develop  You become worse.

## 2012-07-25 DIAGNOSIS — Q762 Congenital spondylolisthesis: Secondary | ICD-10-CM | POA: Diagnosis not present

## 2012-07-25 DIAGNOSIS — M47817 Spondylosis without myelopathy or radiculopathy, lumbosacral region: Secondary | ICD-10-CM | POA: Diagnosis not present

## 2012-07-25 DIAGNOSIS — IMO0001 Reserved for inherently not codable concepts without codable children: Secondary | ICD-10-CM | POA: Diagnosis present

## 2012-07-25 DIAGNOSIS — Z981 Arthrodesis status: Secondary | ICD-10-CM | POA: Diagnosis not present

## 2012-07-25 DIAGNOSIS — Z993 Dependence on wheelchair: Secondary | ICD-10-CM | POA: Diagnosis not present

## 2012-07-25 DIAGNOSIS — G8929 Other chronic pain: Secondary | ICD-10-CM | POA: Diagnosis not present

## 2012-07-25 DIAGNOSIS — M4712 Other spondylosis with myelopathy, cervical region: Secondary | ICD-10-CM | POA: Diagnosis not present

## 2012-07-25 DIAGNOSIS — M5030 Other cervical disc degeneration, unspecified cervical region: Secondary | ICD-10-CM | POA: Diagnosis not present

## 2012-07-25 DIAGNOSIS — E669 Obesity, unspecified: Secondary | ICD-10-CM | POA: Diagnosis present

## 2012-07-25 DIAGNOSIS — Z6841 Body Mass Index (BMI) 40.0 and over, adult: Secondary | ICD-10-CM | POA: Diagnosis not present

## 2012-07-25 DIAGNOSIS — C9111 Chronic lymphocytic leukemia of B-cell type in remission: Secondary | ICD-10-CM | POA: Diagnosis not present

## 2012-07-25 DIAGNOSIS — M4716 Other spondylosis with myelopathy, lumbar region: Secondary | ICD-10-CM | POA: Diagnosis not present

## 2012-07-25 DIAGNOSIS — Z4789 Encounter for other orthopedic aftercare: Secondary | ICD-10-CM | POA: Diagnosis not present

## 2012-07-25 DIAGNOSIS — M48061 Spinal stenosis, lumbar region without neurogenic claudication: Secondary | ICD-10-CM | POA: Diagnosis not present

## 2012-07-25 DIAGNOSIS — M4802 Spinal stenosis, cervical region: Secondary | ICD-10-CM | POA: Diagnosis not present

## 2012-07-25 DIAGNOSIS — D500 Iron deficiency anemia secondary to blood loss (chronic): Secondary | ICD-10-CM | POA: Diagnosis not present

## 2012-07-25 DIAGNOSIS — M5106 Intervertebral disc disorders with myelopathy, lumbar region: Secondary | ICD-10-CM | POA: Diagnosis not present

## 2012-07-25 DIAGNOSIS — M4310 Spondylolisthesis, site unspecified: Secondary | ICD-10-CM | POA: Diagnosis not present

## 2012-07-25 DIAGNOSIS — M5000 Cervical disc disorder with myelopathy, unspecified cervical region: Secondary | ICD-10-CM | POA: Diagnosis not present

## 2012-07-25 DIAGNOSIS — Z9889 Other specified postprocedural states: Secondary | ICD-10-CM | POA: Diagnosis not present

## 2012-07-25 DIAGNOSIS — R5381 Other malaise: Secondary | ICD-10-CM | POA: Diagnosis not present

## 2012-07-25 DIAGNOSIS — Z5189 Encounter for other specified aftercare: Secondary | ICD-10-CM | POA: Diagnosis not present

## 2012-07-25 HISTORY — PX: LUMBAR FUSION: SHX111

## 2012-07-26 ENCOUNTER — Telehealth: Payer: Self-pay | Admitting: *Deleted

## 2012-07-26 NOTE — Telephone Encounter (Signed)
Left message for a return call to reschedule appt.  

## 2012-07-27 DIAGNOSIS — Z8249 Family history of ischemic heart disease and other diseases of the circulatory system: Secondary | ICD-10-CM | POA: Diagnosis not present

## 2012-07-27 DIAGNOSIS — Z5189 Encounter for other specified aftercare: Secondary | ICD-10-CM | POA: Diagnosis not present

## 2012-07-27 DIAGNOSIS — M4802 Spinal stenosis, cervical region: Secondary | ICD-10-CM | POA: Diagnosis not present

## 2012-07-27 DIAGNOSIS — IMO0001 Reserved for inherently not codable concepts without codable children: Secondary | ICD-10-CM | POA: Diagnosis not present

## 2012-07-27 DIAGNOSIS — M48061 Spinal stenosis, lumbar region without neurogenic claudication: Secondary | ICD-10-CM | POA: Diagnosis not present

## 2012-07-27 DIAGNOSIS — R5381 Other malaise: Secondary | ICD-10-CM | POA: Diagnosis not present

## 2012-07-27 DIAGNOSIS — Z79899 Other long term (current) drug therapy: Secondary | ICD-10-CM | POA: Diagnosis not present

## 2012-07-27 DIAGNOSIS — Z7982 Long term (current) use of aspirin: Secondary | ICD-10-CM | POA: Diagnosis not present

## 2012-07-27 DIAGNOSIS — C9111 Chronic lymphocytic leukemia of B-cell type in remission: Secondary | ICD-10-CM | POA: Diagnosis not present

## 2012-07-27 DIAGNOSIS — B009 Herpesviral infection, unspecified: Secondary | ICD-10-CM | POA: Diagnosis not present

## 2012-07-27 DIAGNOSIS — I10 Essential (primary) hypertension: Secondary | ICD-10-CM | POA: Diagnosis not present

## 2012-07-27 DIAGNOSIS — Z9889 Other specified postprocedural states: Secondary | ICD-10-CM | POA: Diagnosis not present

## 2012-07-27 DIAGNOSIS — E785 Hyperlipidemia, unspecified: Secondary | ICD-10-CM | POA: Diagnosis not present

## 2012-07-27 DIAGNOSIS — K5909 Other constipation: Secondary | ICD-10-CM | POA: Diagnosis not present

## 2012-08-01 ENCOUNTER — Telehealth: Payer: Self-pay | Admitting: Oncology

## 2012-08-01 ENCOUNTER — Telehealth: Payer: Self-pay | Admitting: *Deleted

## 2012-08-01 NOTE — Telephone Encounter (Signed)
Returned call to pt's husband re r/s 2/12 appt due to pt in [place]. Pt on schedule for lb 2/12 adn PR 2/19. Husband made aware that both appts have been cx'd and I will call back w/new appts. Husband made aware I will be out until 2/10.

## 2012-08-01 NOTE — Telephone Encounter (Signed)
Left message for a return phone call to reschedule patient's appt. 

## 2012-08-02 ENCOUNTER — Telehealth: Payer: Self-pay | Admitting: *Deleted

## 2012-08-02 NOTE — Telephone Encounter (Signed)
Called and spoke with patient's husband Maisie Fus to reschedule patient's appt. Confirmed appt. For 08/24/12 at 10am with Jacquie Hunter,NP.  Then will become Dr. Darnelle Catalan.

## 2012-08-03 ENCOUNTER — Encounter: Payer: Self-pay | Admitting: Oncology

## 2012-08-03 ENCOUNTER — Other Ambulatory Visit: Payer: Medicare Other | Admitting: Lab

## 2012-08-08 ENCOUNTER — Other Ambulatory Visit: Payer: Medicare Other

## 2012-08-10 ENCOUNTER — Ambulatory Visit: Payer: Medicare Other | Admitting: Oncology

## 2012-08-12 DIAGNOSIS — I10 Essential (primary) hypertension: Secondary | ICD-10-CM | POA: Diagnosis not present

## 2012-08-12 DIAGNOSIS — IMO0001 Reserved for inherently not codable concepts without codable children: Secondary | ICD-10-CM | POA: Diagnosis not present

## 2012-08-12 DIAGNOSIS — M4802 Spinal stenosis, cervical region: Secondary | ICD-10-CM | POA: Diagnosis not present

## 2012-08-12 DIAGNOSIS — Z4789 Encounter for other orthopedic aftercare: Secondary | ICD-10-CM | POA: Diagnosis not present

## 2012-08-14 ENCOUNTER — Ambulatory Visit (INDEPENDENT_AMBULATORY_CARE_PROVIDER_SITE_OTHER): Payer: Medicare Other | Admitting: Internal Medicine

## 2012-08-14 ENCOUNTER — Encounter: Payer: Self-pay | Admitting: Internal Medicine

## 2012-08-14 VITALS — BP 140/80 | HR 95 | Temp 97.6°F | Resp 20

## 2012-08-14 DIAGNOSIS — M4800 Spinal stenosis, site unspecified: Secondary | ICD-10-CM | POA: Diagnosis not present

## 2012-08-14 DIAGNOSIS — M549 Dorsalgia, unspecified: Secondary | ICD-10-CM

## 2012-08-14 DIAGNOSIS — I10 Essential (primary) hypertension: Secondary | ICD-10-CM | POA: Diagnosis not present

## 2012-08-14 DIAGNOSIS — M1990 Unspecified osteoarthritis, unspecified site: Secondary | ICD-10-CM

## 2012-08-14 NOTE — Patient Instructions (Signed)
Call or return to clinic prn if these symptoms worsen or fail to improve as anticipated.  Continue active physical therapy  Return as scheduled for followup  Limit your sodium (Salt) intake  Please check your blood pressure on a regular basis.  If it is consistently greater than 150/90, please make an office appointment.

## 2012-08-14 NOTE — Progress Notes (Signed)
Subjective:    Patient ID: Sheryl [name], female    DOB: Jan 18, 1946, 67 y.o.   MRN: [id number]  HPI  67 year old patient who is seen post hospital discharge. Due to severe back pain she underwent a lumbar laminectomy with decompression and fusion for severe symptomatic spinal stenosis. She has done quite well and continues to receive physical therapy. She is walking with a walker and improving daily. She has a history of dyslipidemia osteoarthritis fibromyalgia. She is quite pleased with her progress. Surgery was performed at [facility] records and discharge medications reviewed  Past Medical History  Diagnosis Date  . 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 DEGENERATIVE JOINT DISEASE 12/14/2006  . EXOGENOUS OBESITY 12/14/2006  . FIBROMYALGIA 12/14/2006  . HYPERLIPIDEMIA 12/14/2006  . HYPERTENSION 12/14/2006  . Fibromyalgia   . Fibromyalgia   . Sarcoid   . Fibromyalgia     History   Social History  . Marital Status: Married    Spouse Name: N/A    Number of Children: N/A  . Years of Education: N/A   Occupational History  . Not on file.   Social History Main Topics  . Smoking status: Never Smoker   . Smokeless tobacco: Never Used  . Alcohol Use: No  . Drug Use: No  . Sexually Active: Not on file   Other Topics Concern  . Not on file   Social History Narrative  . No narrative on file    Past Surgical History  Procedure Laterality Date  . Cholecystectomy    . Tonsillectomy      Family History  Problem Relation Age of Onset  . Diabetes Sister     No Known Allergies  Current Outpatient Prescriptions on File Prior to Visit  Medication Sig Dispense Refill  . aspirin 81 MG tablet Take 81 mg by mouth daily.        Marland Kitchen atorvastatin (LIPITOR) 40 MG tablet Take 1 tablet (40 mg total) by mouth daily.  90 tablet  3  . cetirizine (ZYRTEC) 10 MG tablet Take 10 mg by mouth daily.        . cyclobenzaprine (FLEXERIL) 10 MG tablet Take 1 tablet (10 mg total) by mouth 3  (three) times daily as needed.  60 tablet  3  . fluticasone (FLONASE) 50 MCG/ACT nasal spray Place 2 sprays into the nose daily.  16 g  6  . lisinopril (PRINIVIL,ZESTRIL) 40 MG tablet Take 1 tablet (40 mg total) by mouth daily.  90 tablet  6  . Multiple Vitamin (MULTIVITAMIN) tablet Take 1 tablet by mouth daily.        . nabumetone (RELAFEN) 500 MG tablet Take 1 tablet (500 mg total) by mouth 2 (two) times daily.  90 tablet  6  . omeprazole (PRILOSEC) 20 MG capsule Take 1 capsule (20 mg total) by mouth daily.  90 capsule  4  . solifenacin (VESICARE) 10 MG tablet Take 1 tablet (10 mg total) by mouth daily.  90 tablet  4  . traMADol (ULTRAM) 50 MG tablet Take 1 tablet (50 mg total) by mouth every 6 (six) hours as needed.  90 tablet  4   No current facility-administered medications on file prior to visit.    BP 140/80  Pulse 95  Temp(Src) 97.6 F (36.4 C) (Oral)  Resp 20  SpO2 97%       Review of Systems  HENT: Negative for hearing loss, congestion, sore throat, rhinorrhea, dental problem, sinus pressure and tinnitus.   Eyes: Negative  for pain, discharge and visual disturbance.  Respiratory: Negative for cough and shortness of breath.   Cardiovascular: Negative for chest pain, palpitations and leg swelling.  Gastrointestinal: Negative for nausea, vomiting, abdominal pain, diarrhea, constipation, blood in stool and abdominal distention.  Genitourinary: Negative for dysuria, urgency, frequency, hematuria, flank pain, vaginal bleeding, vaginal discharge, difficulty urinating, vaginal pain and pelvic pain.  Musculoskeletal: Positive for back pain and gait problem. Negative for joint swelling and arthralgias.  Skin: Negative for rash.  Neurological: Positive for weakness. Negative for dizziness, syncope, speech difficulty, numbness and headaches.  Hematological: Negative for adenopathy.  Psychiatric/Behavioral: Negative for behavioral problems, dysphoric mood and agitation. The patient is  not nervous/anxious.        Objective:   Physical Exam  Constitutional: She is oriented to person, place, and time. She appears well-developed and well-nourished. No distress.  Wheelchair-bound. Afebrile. Repeat blood pressure 130/70  HENT:  Head: Normocephalic.  Right Ear: External ear normal.  Left Ear: External ear normal.  Mouth/Throat: Oropharynx is clear and moist.  Eyes: Conjunctivae and EOM are normal. Pupils are equal, round, and reactive to light.  Neck: Normal range of motion. Neck supple. No thyromegaly present.  Cardiovascular: Normal rate, regular rhythm, normal heart sounds and intact distal pulses.   Pulmonary/Chest: Effort normal and breath sounds normal.  Abdominal: Soft. Bowel sounds are normal. She exhibits no mass. There is no tenderness.  Musculoskeletal: Normal range of motion. She exhibits no edema and no tenderness.  Lymphadenopathy:    She has no cervical adenopathy.  Neurological: She is alert and oriented to person, place, and time.  Skin: Skin is warm and dry. No rash noted.  Psychiatric: She has a normal mood and affect. Her behavior is normal.          Assessment & Plan:   Status post lumbar laminectomy with decompression and fusion for severe spinal stenosis. We'll continue physical therapy Hypertension stable Dyslipidemia Osteoarthritis  Medications unchanged Recheck in 2 months as scheduled

## 2012-08-15 ENCOUNTER — Ambulatory Visit: Payer: Medicare Other | Admitting: Oncology

## 2012-08-15 DIAGNOSIS — Z4789 Encounter for other orthopedic aftercare: Secondary | ICD-10-CM | POA: Diagnosis not present

## 2012-08-15 DIAGNOSIS — IMO0001 Reserved for inherently not codable concepts without codable children: Secondary | ICD-10-CM | POA: Diagnosis not present

## 2012-08-15 DIAGNOSIS — I10 Essential (primary) hypertension: Secondary | ICD-10-CM | POA: Diagnosis not present

## 2012-08-15 DIAGNOSIS — M4802 Spinal stenosis, cervical region: Secondary | ICD-10-CM | POA: Diagnosis not present

## 2012-08-17 DIAGNOSIS — Z4789 Encounter for other orthopedic aftercare: Secondary | ICD-10-CM | POA: Diagnosis not present

## 2012-08-17 DIAGNOSIS — M4802 Spinal stenosis, cervical region: Secondary | ICD-10-CM | POA: Diagnosis not present

## 2012-08-17 DIAGNOSIS — IMO0001 Reserved for inherently not codable concepts without codable children: Secondary | ICD-10-CM | POA: Diagnosis not present

## 2012-08-17 DIAGNOSIS — I10 Essential (primary) hypertension: Secondary | ICD-10-CM | POA: Diagnosis not present

## 2012-08-20 DIAGNOSIS — R5381 Other malaise: Secondary | ICD-10-CM | POA: Diagnosis not present

## 2012-08-21 DIAGNOSIS — Z4789 Encounter for other orthopedic aftercare: Secondary | ICD-10-CM | POA: Diagnosis not present

## 2012-08-21 DIAGNOSIS — I10 Essential (primary) hypertension: Secondary | ICD-10-CM | POA: Diagnosis not present

## 2012-08-21 DIAGNOSIS — M4802 Spinal stenosis, cervical region: Secondary | ICD-10-CM | POA: Diagnosis not present

## 2012-08-21 DIAGNOSIS — IMO0001 Reserved for inherently not codable concepts without codable children: Secondary | ICD-10-CM | POA: Diagnosis not present

## 2012-08-24 ENCOUNTER — Ambulatory Visit (HOSPITAL_BASED_OUTPATIENT_CLINIC_OR_DEPARTMENT_OTHER): Payer: Medicare Other | Admitting: Family

## 2012-08-24 ENCOUNTER — Telehealth: Payer: Self-pay | Admitting: Oncology

## 2012-08-24 ENCOUNTER — Ambulatory Visit (HOSPITAL_BASED_OUTPATIENT_CLINIC_OR_DEPARTMENT_OTHER): Payer: Medicare Other | Admitting: Lab

## 2012-08-24 ENCOUNTER — Encounter: Payer: Self-pay | Admitting: Family

## 2012-08-24 VITALS — BP 114/70 | HR 90 | Temp 97.3°F | Resp 20 | Ht 60.0 in | Wt 199.7 lb

## 2012-08-24 DIAGNOSIS — D869 Sarcoidosis, unspecified: Secondary | ICD-10-CM

## 2012-08-24 DIAGNOSIS — I10 Essential (primary) hypertension: Secondary | ICD-10-CM | POA: Diagnosis not present

## 2012-08-24 DIAGNOSIS — IMO0001 Reserved for inherently not codable concepts without codable children: Secondary | ICD-10-CM | POA: Diagnosis not present

## 2012-08-24 DIAGNOSIS — C9110 Chronic lymphocytic leukemia of B-cell type not having achieved remission: Secondary | ICD-10-CM

## 2012-08-24 DIAGNOSIS — Z4789 Encounter for other orthopedic aftercare: Secondary | ICD-10-CM | POA: Diagnosis not present

## 2012-08-24 DIAGNOSIS — M4802 Spinal stenosis, cervical region: Secondary | ICD-10-CM | POA: Diagnosis not present

## 2012-08-24 LAB — CBC WITH DIFFERENTIAL/PLATELET
BASO%: 0.4 % (ref 0.0–2.0)
Basophils Absolute: 0.1 10*3/uL (ref 0.0–0.1)
EOS%: 1.9 % (ref 0.0–7.0)
Eosinophils Absolute: 0.3 10*3/uL (ref 0.0–0.5)
HCT: 38.4 % (ref 34.8–46.6)
HGB: 12.6 g/dL (ref 11.6–15.9)
LYMPH%: 64 % — ABNORMAL HIGH (ref 14.0–49.7)
MCH: 27 pg (ref 25.1–34.0)
MCHC: 32.7 g/dL (ref 31.5–36.0)
MCV: 82.4 fL (ref 79.5–101.0)
MONO#: 0.4 10*3/uL (ref 0.1–0.9)
MONO%: 2.8 % (ref 0.0–14.0)
NEUT#: 4.6 10*3/uL (ref 1.5–6.5)
NEUT%: 30.9 % — ABNORMAL LOW (ref 38.4–76.8)
Platelets: 178 10*3/uL (ref <2.0)
RBC: 4.66 10*6/uL (ref 3.70–5.45)
RDW: 15.8 % — ABNORMAL HIGH (ref 11.2–14.5)
WBC: 14.9 10*3/uL — ABNORMAL HIGH (ref 3.9–10.3)
lymph#: 9.6 10*3/uL — ABNORMAL HIGH (ref 0.9–3.3)

## 2012-08-24 LAB — COMPREHENSIVE METABOLIC PANEL (CC13)
ALT: 12 U/L (ref 0–55)
AST: 15 U/L (ref 5–34)
Albumin: 3.7 g/dL (ref 3.5–5.0)
Alkaline Phosphatase: 163 U/L — ABNORMAL HIGH (ref 40–150)
BUN: 17.7 mg/dL (ref 7.0–26.0)
CO2: 26 mEq/L (ref 22–29)
Calcium: 10 mg/dL (ref 8.4–10.4)
Chloride: 105 mEq/L (ref 98–107)
Creatinine: 0.9 mg/dL (ref 0.6–1.1)
Glucose: 110 mg/dl — ABNORMAL HIGH (ref 70–99)
Potassium: 4 mEq/L (ref 3.5–5.1)
Sodium: 143 mEq/L (ref 136–145)
Total Bilirubin: 0.25 mg/dL (ref 0.20–1.20)
Total Protein: 6.9 g/dL (ref 6.4–8.3)

## 2012-08-24 LAB — LACTATE DEHYDROGENASE (CC13): LDH: 220 U/L (ref 125–245)

## 2012-08-24 LAB — TECHNOLOGIST REVIEW

## 2012-08-24 NOTE — Telephone Encounter (Signed)
Pt sent back to lb and given appt schedule for August. Per 2/28 pof JH/GM pt to follow in 6 months w/JG. Scheduled pt as requested and sent message to [place] informing him.

## 2012-08-24 NOTE — Progress Notes (Signed)
[facility]  [phone] [phone]  OFFICE PROGRESS NOTE   ID: Sheryl [name]   DOB: 01/07/1946  MR#: [id number]  JYN#:[id number]   PCP: Sheryl Boga, MD Sheryl Drain, MD Sheryl [name], GNP Sheryl Quarry, MD    HISTORY OF PRESENT ILLNESS: Ms. Sheryl [name] is a 67 year old [place], [name] with a history of CLL dating back to at least 2004.  She also has a history fibromyalgia and neurosarcoidosis. She has  being been followed by [facility] for a number of years for her neuro sarcoidosis.    INTERVAL HISTORY: Dr. Darnelle [name] and I saw Sheryl [name] today for follow up of end-stage CLL. She accompanied by her husband, Sheryl [name] for today's office visit.  The patient had spinal fusion surgery on 07/25/2012 since her last office visit with Sheryl [name] on 08/10/2011.  The patient is establishing herself with Dr. Darrall [name] service today.   REVIEW OF SYSTEMS: A 10 point review of systems was completed and the patient reports chronic pain related to fibromyalgia daily in her knees, elbows, shoulders and ankles.  She continues to experience progressive right eye vision loss and left ear hearing loss due to neurosarcoidosis. The patient also reports intermittent swelling in her lower left occipital area at night when she is resting that is relieved when she takes a Tramadol and sits upright. The patient denies any other symptomatology.   PAST MEDICAL HISTORY: Past Medical History  Diagnosis Date  . 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 DEGENERATIVE JOINT DISEASE 12/14/2006  . EXOGENOUS OBESITY 12/14/2006  . HYPERLIPIDEMIA 12/14/2006  . HYPERTENSION 12/14/2006  . Neurosarcoidosis   . FIBROMYALGIA 12/14/2006  . Sleep apnea   . Hearing loss of left ear   . Vision loss of right eye   . IBS (irritable bowel syndrome)     PAST SURGICAL HISTORY: Past Surgical History  Procedure Laterality Date  . Cholecystectomy     . Tonsillectomy    . Spinal fusion  07/25/2012    FAMILY HISTORY Family History  Problem Relation Age of Onset  . Diabetes Sister     HEALTH MAINTENANCE: History  Substance Use Topics  . Smoking status: Never Smoker   . Smokeless tobacco: Never Used  . Alcohol Use: No     Comment: One drink per year   ALLERGIES: No Known Allergies   MEDICATIONS: Current Outpatient Prescriptions  Medication Sig Dispense Refill  . aspirin 81 MG tablet Take 81 mg by mouth daily.        Marland Kitchen atorvastatin (LIPITOR) 40 MG tablet Take 1 tablet (40 mg total) by mouth daily.  90 tablet  3  . cetirizine (ZYRTEC) 10 MG tablet Take 10 mg by mouth daily.        . cyclobenzaprine (FLEXERIL) 10 MG tablet Take 1 tablet (10 mg total) by mouth 3 (three) times daily as needed.  60 tablet  3  . fluticasone (FLONASE) 50 MCG/ACT nasal spray Place 2 sprays into the nose daily.  16 g  6  . lisinopril (PRINIVIL,ZESTRIL) 40 MG tablet Take 1 tablet (40 mg total) by mouth daily.  90 tablet  6  . Multiple Vitamin (MULTIVITAMIN) tablet Take 1 tablet by mouth daily.        . nabumetone (RELAFEN) 500 MG tablet Take 1 tablet (500 mg total) by mouth 2 (two) times daily.  90 tablet  6  . omeprazole (PRILOSEC) 20 MG capsule Take 1 capsule (20 mg total) by mouth daily.  90 capsule  4  . solifenacin (VESICARE) 10 MG tablet Take 1 tablet (10 mg total) by mouth daily.  90 tablet  4  . traMADol (ULTRAM) 50 MG tablet Take 1 tablet (50 mg total) by mouth every 6 (six) hours as needed.  90 tablet  4   No current facility-administered medications for this visit.    OBJECTIVE: Filed Vitals:   08/24/12 1001  BP: 114/70  Pulse: 90  Temp: 97.3 F (36.3 C)  Resp: 20     Body mass index is 39 kg/(m^2).      ECOG FS:  Grade 3 - Capable of limited self-care                       General appearance: Alert, cooperative, well nourished, no apparent distress, sitting in a wheelchair Head: Normocephalic, without obvious abnormality,  atraumatic Eyes: Bilateral cataracts, PERRLA, EOMI, not icteric Nose: Nares, septum and mucosa are normal, no drainage or sinus tenderness Neck: No adenopathy, supple, symmetrical, trachea midline, thyroid not enlarged, no tenderness Resp: Clear to auscultation bilaterally Cardio: Regular rate and rhythm, S1, S2 normal, 2/6 murmur, no click, rub or gallop GI: Soft, distended, non-tender, hypoactive bowel sounds, no organomegaly from sitting position, excessive habitus Extremities: Extremities normal, atraumatic, no cyanosis or edema, limited UE and LE ROM Lymph nodes: Cervical, supraclavicular, and axillary nodes normal Neurologic: Grossly normal   LAB RESULTS: Lab Results  Component Value Date   WBC 14.9* 08/24/2012   NEUTROABS 4.6 08/24/2012   HGB 12.6 08/24/2012   HCT 38.4 08/24/2012   MCV 82.4 08/24/2012   PLT 178 08/24/2012      Chemistry      Component Value Date/Time   NA 143 08/24/2012 1202   NA 143 02/10/2012 1051   K 4.0 08/24/2012 1202   K 4.0 02/10/2012 1051   CL 105 08/24/2012 1202   CL 106 02/10/2012 1051   CO2 26 08/24/2012 1202   CO2 27 02/10/2012 1051   BUN 17.7 08/24/2012 1202   BUN 23 02/10/2012 1051   CREATININE 0.9 08/24/2012 1202   CREATININE 1.07 02/10/2012 1051      Component Value Date/Time   CALCIUM 10.0 08/24/2012 1202   CALCIUM 8.8 02/10/2012 1051   ALKPHOS 163* 08/24/2012 1202   ALKPHOS 100 02/10/2012 1051   AST 15 08/24/2012 1202   AST 19 02/10/2012 1051   ALT 12 08/24/2012 1202   ALT 29 02/10/2012 1051   BILITOT 0.25 08/24/2012 1202   BILITOT 0.3 02/10/2012 1051      Urinalysis    Component Value Date/Time   BILIRUBINUR n 03/16/2012 1508   UROBILINOGEN 0.2 03/16/2012 1508   NITRITE n 03/16/2012 1508   LEUKOCYTESUR small (1+) 03/16/2012 1508    STUDIES: No results found.  ASSESSMENT: 67 y.o. [place] woman: 1. CLL  2. Neuro sarcoidosis  PLAN: 1. Dr. Darnelle [name] will be transferring care of the patient to Sheryl [name] service.  Laboratories to be checked today include a CMP, CBC, beta-2  Microglobulin, and LDH.  He has scheduled the patient to see Dr. Cyndie [name] in six months.  2. The patient will continue to be followed by [facility] for neuro sarcoidosis.  The patient was asked to report her recent neuro symptoms her neurologist.  All questions were answered.  The patient was encouraged to contact us in the interim with any problems, questions or concerns.    Larina Bras, NP-C 08/25/2012, 5:26 PM

## 2012-08-27 DIAGNOSIS — IMO0001 Reserved for inherently not codable concepts without codable children: Secondary | ICD-10-CM | POA: Diagnosis not present

## 2012-08-27 DIAGNOSIS — M4802 Spinal stenosis, cervical region: Secondary | ICD-10-CM | POA: Diagnosis not present

## 2012-08-27 DIAGNOSIS — I10 Essential (primary) hypertension: Secondary | ICD-10-CM | POA: Diagnosis not present

## 2012-08-27 DIAGNOSIS — Z4789 Encounter for other orthopedic aftercare: Secondary | ICD-10-CM | POA: Diagnosis not present

## 2012-08-27 LAB — BETA 2 MICROGLOBULIN, SERUM: Beta-2 Microglobulin: 1.9 mg/L — ABNORMAL HIGH (ref 1.01–1.73)

## 2012-08-28 DIAGNOSIS — Z4789 Encounter for other orthopedic aftercare: Secondary | ICD-10-CM | POA: Diagnosis not present

## 2012-08-28 DIAGNOSIS — M4802 Spinal stenosis, cervical region: Secondary | ICD-10-CM | POA: Diagnosis not present

## 2012-08-28 DIAGNOSIS — I10 Essential (primary) hypertension: Secondary | ICD-10-CM | POA: Diagnosis not present

## 2012-08-28 DIAGNOSIS — IMO0001 Reserved for inherently not codable concepts without codable children: Secondary | ICD-10-CM | POA: Diagnosis not present

## 2012-08-30 DIAGNOSIS — Z4789 Encounter for other orthopedic aftercare: Secondary | ICD-10-CM | POA: Diagnosis not present

## 2012-08-30 DIAGNOSIS — M4802 Spinal stenosis, cervical region: Secondary | ICD-10-CM | POA: Diagnosis not present

## 2012-08-30 DIAGNOSIS — I10 Essential (primary) hypertension: Secondary | ICD-10-CM | POA: Diagnosis not present

## 2012-08-30 DIAGNOSIS — IMO0001 Reserved for inherently not codable concepts without codable children: Secondary | ICD-10-CM | POA: Diagnosis not present

## 2012-09-03 DIAGNOSIS — Z4789 Encounter for other orthopedic aftercare: Secondary | ICD-10-CM | POA: Diagnosis not present

## 2012-09-03 DIAGNOSIS — M4802 Spinal stenosis, cervical region: Secondary | ICD-10-CM | POA: Diagnosis not present

## 2012-09-03 DIAGNOSIS — IMO0001 Reserved for inherently not codable concepts without codable children: Secondary | ICD-10-CM | POA: Diagnosis not present

## 2012-09-03 DIAGNOSIS — I10 Essential (primary) hypertension: Secondary | ICD-10-CM | POA: Diagnosis not present

## 2012-09-05 DIAGNOSIS — M5106 Intervertebral disc disorders with myelopathy, lumbar region: Secondary | ICD-10-CM | POA: Diagnosis not present

## 2012-09-05 DIAGNOSIS — M48061 Spinal stenosis, lumbar region without neurogenic claudication: Secondary | ICD-10-CM | POA: Diagnosis not present

## 2012-09-05 DIAGNOSIS — M5030 Other cervical disc degeneration, unspecified cervical region: Secondary | ICD-10-CM | POA: Diagnosis not present

## 2012-09-05 DIAGNOSIS — M4310 Spondylolisthesis, site unspecified: Secondary | ICD-10-CM | POA: Diagnosis not present

## 2012-09-06 DIAGNOSIS — I10 Essential (primary) hypertension: Secondary | ICD-10-CM | POA: Diagnosis not present

## 2012-09-06 DIAGNOSIS — M4802 Spinal stenosis, cervical region: Secondary | ICD-10-CM | POA: Diagnosis not present

## 2012-09-06 DIAGNOSIS — IMO0001 Reserved for inherently not codable concepts without codable children: Secondary | ICD-10-CM | POA: Diagnosis not present

## 2012-09-06 DIAGNOSIS — Z4789 Encounter for other orthopedic aftercare: Secondary | ICD-10-CM | POA: Diagnosis not present

## 2012-09-08 ENCOUNTER — Other Ambulatory Visit: Payer: Self-pay | Admitting: Internal Medicine

## 2012-09-19 DIAGNOSIS — IMO0002 Reserved for concepts with insufficient information to code with codable children: Secondary | ICD-10-CM | POA: Diagnosis not present

## 2012-09-19 DIAGNOSIS — R262 Difficulty in walking, not elsewhere classified: Secondary | ICD-10-CM | POA: Diagnosis not present

## 2012-09-19 DIAGNOSIS — IMO0001 Reserved for inherently not codable concepts without codable children: Secondary | ICD-10-CM | POA: Diagnosis not present

## 2012-09-19 DIAGNOSIS — M549 Dorsalgia, unspecified: Secondary | ICD-10-CM | POA: Diagnosis not present

## 2012-09-21 DIAGNOSIS — M549 Dorsalgia, unspecified: Secondary | ICD-10-CM | POA: Diagnosis not present

## 2012-09-21 DIAGNOSIS — R262 Difficulty in walking, not elsewhere classified: Secondary | ICD-10-CM | POA: Diagnosis not present

## 2012-09-21 DIAGNOSIS — IMO0001 Reserved for inherently not codable concepts without codable children: Secondary | ICD-10-CM | POA: Diagnosis not present

## 2012-09-21 DIAGNOSIS — IMO0002 Reserved for concepts with insufficient information to code with codable children: Secondary | ICD-10-CM | POA: Diagnosis not present

## 2012-09-24 DIAGNOSIS — IMO0002 Reserved for concepts with insufficient information to code with codable children: Secondary | ICD-10-CM | POA: Diagnosis not present

## 2012-09-24 DIAGNOSIS — M549 Dorsalgia, unspecified: Secondary | ICD-10-CM | POA: Diagnosis not present

## 2012-09-24 DIAGNOSIS — R262 Difficulty in walking, not elsewhere classified: Secondary | ICD-10-CM | POA: Diagnosis not present

## 2012-09-24 DIAGNOSIS — IMO0001 Reserved for inherently not codable concepts without codable children: Secondary | ICD-10-CM | POA: Diagnosis not present

## 2012-09-26 DIAGNOSIS — IMO0002 Reserved for concepts with insufficient information to code with codable children: Secondary | ICD-10-CM | POA: Diagnosis not present

## 2012-09-26 DIAGNOSIS — M25569 Pain in unspecified knee: Secondary | ICD-10-CM | POA: Diagnosis not present

## 2012-09-26 DIAGNOSIS — M549 Dorsalgia, unspecified: Secondary | ICD-10-CM | POA: Diagnosis not present

## 2012-09-26 DIAGNOSIS — IMO0001 Reserved for inherently not codable concepts without codable children: Secondary | ICD-10-CM | POA: Diagnosis not present

## 2012-09-26 DIAGNOSIS — R262 Difficulty in walking, not elsewhere classified: Secondary | ICD-10-CM | POA: Diagnosis not present

## 2012-09-26 DIAGNOSIS — M25559 Pain in unspecified hip: Secondary | ICD-10-CM | POA: Diagnosis not present

## 2012-10-03 DIAGNOSIS — M25559 Pain in unspecified hip: Secondary | ICD-10-CM | POA: Diagnosis not present

## 2012-10-03 DIAGNOSIS — M549 Dorsalgia, unspecified: Secondary | ICD-10-CM | POA: Diagnosis not present

## 2012-10-03 DIAGNOSIS — R262 Difficulty in walking, not elsewhere classified: Secondary | ICD-10-CM | POA: Diagnosis not present

## 2012-10-03 DIAGNOSIS — M25569 Pain in unspecified knee: Secondary | ICD-10-CM | POA: Diagnosis not present

## 2012-10-03 DIAGNOSIS — IMO0001 Reserved for inherently not codable concepts without codable children: Secondary | ICD-10-CM | POA: Diagnosis not present

## 2012-10-03 DIAGNOSIS — IMO0002 Reserved for concepts with insufficient information to code with codable children: Secondary | ICD-10-CM | POA: Diagnosis not present

## 2012-10-05 DIAGNOSIS — IMO0001 Reserved for inherently not codable concepts without codable children: Secondary | ICD-10-CM | POA: Diagnosis not present

## 2012-10-05 DIAGNOSIS — IMO0002 Reserved for concepts with insufficient information to code with codable children: Secondary | ICD-10-CM | POA: Diagnosis not present

## 2012-10-05 DIAGNOSIS — M25559 Pain in unspecified hip: Secondary | ICD-10-CM | POA: Diagnosis not present

## 2012-10-05 DIAGNOSIS — M25569 Pain in unspecified knee: Secondary | ICD-10-CM | POA: Diagnosis not present

## 2012-10-05 DIAGNOSIS — R262 Difficulty in walking, not elsewhere classified: Secondary | ICD-10-CM | POA: Diagnosis not present

## 2012-10-05 DIAGNOSIS — M549 Dorsalgia, unspecified: Secondary | ICD-10-CM | POA: Diagnosis not present

## 2012-10-09 DIAGNOSIS — M549 Dorsalgia, unspecified: Secondary | ICD-10-CM | POA: Diagnosis not present

## 2012-10-09 DIAGNOSIS — M25559 Pain in unspecified hip: Secondary | ICD-10-CM | POA: Diagnosis not present

## 2012-10-09 DIAGNOSIS — IMO0001 Reserved for inherently not codable concepts without codable children: Secondary | ICD-10-CM | POA: Diagnosis not present

## 2012-10-09 DIAGNOSIS — R262 Difficulty in walking, not elsewhere classified: Secondary | ICD-10-CM | POA: Diagnosis not present

## 2012-10-09 DIAGNOSIS — M25569 Pain in unspecified knee: Secondary | ICD-10-CM | POA: Diagnosis not present

## 2012-10-09 DIAGNOSIS — IMO0002 Reserved for concepts with insufficient information to code with codable children: Secondary | ICD-10-CM | POA: Diagnosis not present

## 2012-10-15 DIAGNOSIS — M4310 Spondylolisthesis, site unspecified: Secondary | ICD-10-CM | POA: Diagnosis not present

## 2012-10-15 DIAGNOSIS — M25569 Pain in unspecified knee: Secondary | ICD-10-CM | POA: Diagnosis not present

## 2012-10-15 DIAGNOSIS — M5106 Intervertebral disc disorders with myelopathy, lumbar region: Secondary | ICD-10-CM | POA: Diagnosis not present

## 2012-10-15 DIAGNOSIS — M25559 Pain in unspecified hip: Secondary | ICD-10-CM | POA: Diagnosis not present

## 2012-10-16 ENCOUNTER — Encounter: Payer: Self-pay | Admitting: Internal Medicine

## 2012-10-16 ENCOUNTER — Ambulatory Visit (INDEPENDENT_AMBULATORY_CARE_PROVIDER_SITE_OTHER): Payer: Medicare Other | Admitting: Internal Medicine

## 2012-10-16 VITALS — BP 168/100 | HR 110 | Temp 98.4°F | Resp 20 | Wt 202.0 lb

## 2012-10-16 DIAGNOSIS — IMO0001 Reserved for inherently not codable concepts without codable children: Secondary | ICD-10-CM

## 2012-10-16 DIAGNOSIS — M25569 Pain in unspecified knee: Secondary | ICD-10-CM | POA: Diagnosis not present

## 2012-10-16 DIAGNOSIS — R262 Difficulty in walking, not elsewhere classified: Secondary | ICD-10-CM | POA: Diagnosis not present

## 2012-10-16 DIAGNOSIS — M549 Dorsalgia, unspecified: Secondary | ICD-10-CM | POA: Diagnosis not present

## 2012-10-16 DIAGNOSIS — M1990 Unspecified osteoarthritis, unspecified site: Secondary | ICD-10-CM

## 2012-10-16 DIAGNOSIS — IMO0002 Reserved for concepts with insufficient information to code with codable children: Secondary | ICD-10-CM | POA: Diagnosis not present

## 2012-10-16 DIAGNOSIS — I10 Essential (primary) hypertension: Secondary | ICD-10-CM

## 2012-10-16 DIAGNOSIS — M25559 Pain in unspecified hip: Secondary | ICD-10-CM | POA: Diagnosis not present

## 2012-10-16 MED ORDER — FLUTICASONE PROPIONATE 50 MCG/ACT NA SUSP: 2.0000 | Freq: Every day | NASAL | Status: DC

## 2012-10-16 MED ORDER — SOLIFENACIN SUCCINATE 10 MG PO TABS: 10.0000 mg | ORAL_TABLET | Freq: Every day | ORAL | Status: DC

## 2012-10-16 MED ORDER — ATORVASTATIN CALCIUM 40 MG PO TABS: 40.0000 mg | ORAL_TABLET | Freq: Every day | ORAL | Status: DC

## 2012-10-16 MED ORDER — LISINOPRIL 40 MG PO TABS: 40.0000 mg | ORAL_TABLET | Freq: Every day | ORAL | Status: DC

## 2012-10-16 NOTE — Patient Instructions (Signed)
Limit your sodium (Salt) intake  Return in 6 months for follow-up  

## 2012-10-16 NOTE — Progress Notes (Signed)
Subjective:    Patient ID: Sheryl [name], female    DOB: 1945-10-31, 67 y.o.   MRN: [id number]  HPI  67 year old patient who has a history of osteoarthritis and fibromyalgia. She's had a recent lumbar fusion. The past several days she has had some right lateral knee pain. She was evaluated by orthopedics last week and is on a prednisone dose pack. She is scheduled for followup in 8 days. She is seen today for her six-month followup. She does have treated hypertension and a history of dyslipidemia. X-rays of the right knee last week revealed mild arthritic changes only   BP Readings from Last 3 Encounters:  10/16/12 168/100  08/24/12 114/70  08/14/12 140/80    Past Medical History  Diagnosis Date  . 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 DEGENERATIVE JOINT DISEASE 12/14/2006  . EXOGENOUS OBESITY 12/14/2006  . HYPERLIPIDEMIA 12/14/2006  . HYPERTENSION 12/14/2006  . Neurosarcoidosis   . FIBROMYALGIA 12/14/2006  . Sleep apnea   . Hearing loss of left ear   . Vision loss of right eye   . IBS (irritable bowel syndrome)     History   Social History  . Marital Status: Married    Spouse Name: N/A    Number of Children: N/A  . Years of Education: N/A   Occupational History  . Not on file.   Social History Main Topics  . Smoking status: Never Smoker   . Smokeless tobacco: Never Used  . Alcohol Use: No     Comment: One drink per year  . Drug Use: No  . Sexually Active: Not on file   Other Topics Concern  . Not on file   Social History Narrative  . No narrative on file    Past Surgical History  Procedure Laterality Date  . Cholecystectomy    . Tonsillectomy    . Spinal fusion  07/25/2012    Family History  Problem Relation Age of Onset  . Diabetes Sister     No Known Allergies  Current Outpatient Prescriptions on File Prior to Visit  Medication Sig Dispense Refill  . aspirin 81 MG tablet Take 81 mg by mouth daily.        . cetirizine (ZYRTEC) 10 MG tablet Take 10 mg by mouth  daily.        . cyclobenzaprine (FLEXERIL) 10 MG tablet Take 1 tablet (10 mg total) by mouth 3 (three) times daily as needed.  60 tablet  3  . Multiple Vitamin (MULTIVITAMIN) tablet Take 1 tablet by mouth daily.        . nabumetone (RELAFEN) 500 MG tablet Take 1 tablet (500 mg total) by mouth 2 (two) times daily.  90 tablet  6  . omeprazole (PRILOSEC) 20 MG capsule Take 1 capsule (20 mg total) by mouth daily.  90 capsule  4  . traMADol (ULTRAM) 50 MG tablet Take 1 tablet (50 mg total) by mouth every 6 (six) hours as needed.  90 tablet  4   No current facility-administered medications on file prior to visit.    BP 168/100  Pulse 110  Temp(Src) 98.4 F (36.9 C) (Oral)  Resp 20  Wt 202 lb (91.627 kg)  BMI 39.45 kg/m2  SpO2 98%        Review of Systems  Constitutional: Negative.   HENT: Negative for hearing loss, congestion, sore throat, rhinorrhea, dental problem, sinus pressure and tinnitus.   Eyes: Negative for pain, discharge and visual disturbance.  Respiratory: Negative for cough and shortness of  breath.   Cardiovascular: Negative for chest pain, palpitations and leg swelling.  Gastrointestinal: Negative for nausea, vomiting, abdominal pain, diarrhea, constipation, blood in stool and abdominal distention.  Genitourinary: Negative for dysuria, urgency, frequency, hematuria, flank pain, vaginal bleeding, vaginal discharge, difficulty urinating, vaginal pain and pelvic pain.  Musculoskeletal: Positive for arthralgias and gait problem. Negative for joint swelling.  Skin: Negative for rash.  Neurological: Negative for dizziness, syncope, speech difficulty, weakness, numbness and headaches.  Hematological: Negative for adenopathy.  Psychiatric/Behavioral: Negative for behavioral problems, dysphoric mood and agitation. The patient is not nervous/anxious.        Objective:   Physical Exam  Constitutional: She is oriented to person, place, and time. She appears well-developed and  well-nourished.  Use a walker Blood pressure 140/90 on repeat  HENT:  Head: Normocephalic.  Right Ear: External ear normal.  Left Ear: External ear normal.  Mouth/Throat: Oropharynx is clear and moist.  Eyes: Conjunctivae and EOM are normal. Pupils are equal, round, and reactive to light.  Neck: Normal range of motion. Neck supple. No thyromegaly present.  Cardiovascular: Normal rate, regular rhythm, normal heart sounds and intact distal pulses.   Pulmonary/Chest: Effort normal and breath sounds normal.  Abdominal: Soft. Bowel sounds are normal. She exhibits no mass. There is no tenderness.  Musculoskeletal: Normal range of motion.  Lymphadenopathy:    She has no cervical adenopathy.  Neurological: She is alert and oriented to person, place, and time.  Skin: Skin is warm and dry. No rash noted.  Psychiatric: She has a normal mood and affect. Her behavior is normal.          Assessment & Plan:   Right knee pain. Followup orthopedics next week improving Hypertension stable Fibromyalgia History of CLL  Recheck 6 months

## 2012-10-18 DIAGNOSIS — M25559 Pain in unspecified hip: Secondary | ICD-10-CM | POA: Diagnosis not present

## 2012-10-18 DIAGNOSIS — IMO0001 Reserved for inherently not codable concepts without codable children: Secondary | ICD-10-CM | POA: Diagnosis not present

## 2012-10-18 DIAGNOSIS — M25569 Pain in unspecified knee: Secondary | ICD-10-CM | POA: Diagnosis not present

## 2012-10-18 DIAGNOSIS — IMO0002 Reserved for concepts with insufficient information to code with codable children: Secondary | ICD-10-CM | POA: Diagnosis not present

## 2012-10-18 DIAGNOSIS — M549 Dorsalgia, unspecified: Secondary | ICD-10-CM | POA: Diagnosis not present

## 2012-10-18 DIAGNOSIS — R262 Difficulty in walking, not elsewhere classified: Secondary | ICD-10-CM | POA: Diagnosis not present

## 2012-10-23 DIAGNOSIS — IMO0002 Reserved for concepts with insufficient information to code with codable children: Secondary | ICD-10-CM | POA: Diagnosis not present

## 2012-10-23 DIAGNOSIS — R262 Difficulty in walking, not elsewhere classified: Secondary | ICD-10-CM | POA: Diagnosis not present

## 2012-10-23 DIAGNOSIS — M25569 Pain in unspecified knee: Secondary | ICD-10-CM | POA: Diagnosis not present

## 2012-10-23 DIAGNOSIS — IMO0001 Reserved for inherently not codable concepts without codable children: Secondary | ICD-10-CM | POA: Diagnosis not present

## 2012-10-23 DIAGNOSIS — M549 Dorsalgia, unspecified: Secondary | ICD-10-CM | POA: Diagnosis not present

## 2012-10-23 DIAGNOSIS — M25559 Pain in unspecified hip: Secondary | ICD-10-CM | POA: Diagnosis not present

## 2012-10-24 DIAGNOSIS — M4310 Spondylolisthesis, site unspecified: Secondary | ICD-10-CM | POA: Diagnosis not present

## 2012-10-24 DIAGNOSIS — M5106 Intervertebral disc disorders with myelopathy, lumbar region: Secondary | ICD-10-CM | POA: Diagnosis not present

## 2012-10-24 DIAGNOSIS — M25569 Pain in unspecified knee: Secondary | ICD-10-CM | POA: Diagnosis not present

## 2012-10-24 DIAGNOSIS — M25559 Pain in unspecified hip: Secondary | ICD-10-CM | POA: Diagnosis not present

## 2012-10-25 DIAGNOSIS — M25559 Pain in unspecified hip: Secondary | ICD-10-CM | POA: Diagnosis not present

## 2012-10-25 DIAGNOSIS — M549 Dorsalgia, unspecified: Secondary | ICD-10-CM | POA: Diagnosis not present

## 2012-10-25 DIAGNOSIS — IMO0002 Reserved for concepts with insufficient information to code with codable children: Secondary | ICD-10-CM | POA: Diagnosis not present

## 2012-10-25 DIAGNOSIS — IMO0001 Reserved for inherently not codable concepts without codable children: Secondary | ICD-10-CM | POA: Diagnosis not present

## 2012-10-25 DIAGNOSIS — M25569 Pain in unspecified knee: Secondary | ICD-10-CM | POA: Diagnosis not present

## 2012-10-25 DIAGNOSIS — R262 Difficulty in walking, not elsewhere classified: Secondary | ICD-10-CM | POA: Diagnosis not present

## 2012-10-30 DIAGNOSIS — IMO0002 Reserved for concepts with insufficient information to code with codable children: Secondary | ICD-10-CM | POA: Diagnosis not present

## 2012-10-30 DIAGNOSIS — M549 Dorsalgia, unspecified: Secondary | ICD-10-CM | POA: Diagnosis not present

## 2012-10-30 DIAGNOSIS — M25569 Pain in unspecified knee: Secondary | ICD-10-CM | POA: Diagnosis not present

## 2012-10-30 DIAGNOSIS — IMO0001 Reserved for inherently not codable concepts without codable children: Secondary | ICD-10-CM | POA: Diagnosis not present

## 2012-10-30 DIAGNOSIS — R262 Difficulty in walking, not elsewhere classified: Secondary | ICD-10-CM | POA: Diagnosis not present

## 2012-10-30 DIAGNOSIS — M25559 Pain in unspecified hip: Secondary | ICD-10-CM | POA: Diagnosis not present

## 2012-11-01 DIAGNOSIS — M25569 Pain in unspecified knee: Secondary | ICD-10-CM | POA: Diagnosis not present

## 2012-11-01 DIAGNOSIS — M25559 Pain in unspecified hip: Secondary | ICD-10-CM | POA: Diagnosis not present

## 2012-11-01 DIAGNOSIS — IMO0002 Reserved for concepts with insufficient information to code with codable children: Secondary | ICD-10-CM | POA: Diagnosis not present

## 2012-11-01 DIAGNOSIS — R262 Difficulty in walking, not elsewhere classified: Secondary | ICD-10-CM | POA: Diagnosis not present

## 2012-11-01 DIAGNOSIS — IMO0001 Reserved for inherently not codable concepts without codable children: Secondary | ICD-10-CM | POA: Diagnosis not present

## 2012-11-01 DIAGNOSIS — M549 Dorsalgia, unspecified: Secondary | ICD-10-CM | POA: Diagnosis not present

## 2012-11-09 DIAGNOSIS — M25569 Pain in unspecified knee: Secondary | ICD-10-CM | POA: Diagnosis not present

## 2012-11-09 DIAGNOSIS — R262 Difficulty in walking, not elsewhere classified: Secondary | ICD-10-CM | POA: Diagnosis not present

## 2012-11-09 DIAGNOSIS — IMO0002 Reserved for concepts with insufficient information to code with codable children: Secondary | ICD-10-CM | POA: Diagnosis not present

## 2012-11-09 DIAGNOSIS — IMO0001 Reserved for inherently not codable concepts without codable children: Secondary | ICD-10-CM | POA: Diagnosis not present

## 2012-11-09 DIAGNOSIS — M549 Dorsalgia, unspecified: Secondary | ICD-10-CM | POA: Diagnosis not present

## 2012-11-09 DIAGNOSIS — M25559 Pain in unspecified hip: Secondary | ICD-10-CM | POA: Diagnosis not present

## 2012-11-13 DIAGNOSIS — IMO0001 Reserved for inherently not codable concepts without codable children: Secondary | ICD-10-CM | POA: Diagnosis not present

## 2012-11-13 DIAGNOSIS — M549 Dorsalgia, unspecified: Secondary | ICD-10-CM | POA: Diagnosis not present

## 2012-11-13 DIAGNOSIS — IMO0002 Reserved for concepts with insufficient information to code with codable children: Secondary | ICD-10-CM | POA: Diagnosis not present

## 2012-11-13 DIAGNOSIS — M25559 Pain in unspecified hip: Secondary | ICD-10-CM | POA: Diagnosis not present

## 2012-11-13 DIAGNOSIS — R262 Difficulty in walking, not elsewhere classified: Secondary | ICD-10-CM | POA: Diagnosis not present

## 2012-11-13 DIAGNOSIS — M25569 Pain in unspecified knee: Secondary | ICD-10-CM | POA: Diagnosis not present

## 2012-11-20 DIAGNOSIS — M4310 Spondylolisthesis, site unspecified: Secondary | ICD-10-CM | POA: Diagnosis not present

## 2012-11-20 DIAGNOSIS — M25559 Pain in unspecified hip: Secondary | ICD-10-CM | POA: Diagnosis not present

## 2012-11-20 DIAGNOSIS — M5106 Intervertebral disc disorders with myelopathy, lumbar region: Secondary | ICD-10-CM | POA: Diagnosis not present

## 2012-11-20 DIAGNOSIS — M25569 Pain in unspecified knee: Secondary | ICD-10-CM | POA: Diagnosis not present

## 2012-11-22 DIAGNOSIS — IMO0001 Reserved for inherently not codable concepts without codable children: Secondary | ICD-10-CM | POA: Diagnosis not present

## 2012-11-22 DIAGNOSIS — M25559 Pain in unspecified hip: Secondary | ICD-10-CM | POA: Diagnosis not present

## 2012-11-22 DIAGNOSIS — R262 Difficulty in walking, not elsewhere classified: Secondary | ICD-10-CM | POA: Diagnosis not present

## 2012-11-22 DIAGNOSIS — M549 Dorsalgia, unspecified: Secondary | ICD-10-CM | POA: Diagnosis not present

## 2012-11-22 DIAGNOSIS — M25569 Pain in unspecified knee: Secondary | ICD-10-CM | POA: Diagnosis not present

## 2012-11-22 DIAGNOSIS — IMO0002 Reserved for concepts with insufficient information to code with codable children: Secondary | ICD-10-CM | POA: Diagnosis not present

## 2012-11-28 DIAGNOSIS — IMO0001 Reserved for inherently not codable concepts without codable children: Secondary | ICD-10-CM | POA: Diagnosis not present

## 2012-11-28 DIAGNOSIS — M545 Low back pain, unspecified: Secondary | ICD-10-CM | POA: Diagnosis not present

## 2012-11-28 DIAGNOSIS — G4700 Insomnia, unspecified: Secondary | ICD-10-CM | POA: Diagnosis not present

## 2012-11-28 DIAGNOSIS — M109 Gout, unspecified: Secondary | ICD-10-CM | POA: Diagnosis not present

## 2012-11-28 DIAGNOSIS — M19049 Primary osteoarthritis, unspecified hand: Secondary | ICD-10-CM | POA: Diagnosis not present

## 2012-11-28 DIAGNOSIS — Z79899 Other long term (current) drug therapy: Secondary | ICD-10-CM | POA: Diagnosis not present

## 2013-01-16 DIAGNOSIS — H468 Other optic neuritis: Secondary | ICD-10-CM | POA: Diagnosis not present

## 2013-01-23 DIAGNOSIS — M4310 Spondylolisthesis, site unspecified: Secondary | ICD-10-CM | POA: Diagnosis not present

## 2013-01-23 DIAGNOSIS — IMO0001 Reserved for inherently not codable concepts without codable children: Secondary | ICD-10-CM | POA: Diagnosis not present

## 2013-01-23 DIAGNOSIS — M25559 Pain in unspecified hip: Secondary | ICD-10-CM | POA: Diagnosis not present

## 2013-01-23 DIAGNOSIS — M25569 Pain in unspecified knee: Secondary | ICD-10-CM | POA: Diagnosis not present

## 2013-02-15 ENCOUNTER — Other Ambulatory Visit (HOSPITAL_BASED_OUTPATIENT_CLINIC_OR_DEPARTMENT_OTHER): Payer: Medicare Other | Admitting: Lab

## 2013-02-15 DIAGNOSIS — C9110 Chronic lymphocytic leukemia of B-cell type not having achieved remission: Secondary | ICD-10-CM

## 2013-02-15 LAB — COMPREHENSIVE METABOLIC PANEL (CC13)
ALT: 20 U/L (ref 0–55)
AST: 20 U/L (ref 5–34)
Albumin: 4 g/dL (ref 3.5–5.0)
Alkaline Phosphatase: 142 U/L (ref 40–150)
BUN: 20.7 mg/dL (ref 7.0–26.0)
CO2: 24 mEq/L (ref 22–29)
Calcium: 10.2 mg/dL (ref 8.4–10.4)
Chloride: 107 mEq/L (ref 98–109)
Creatinine: 0.9 mg/dL (ref 0.6–1.1)
Glucose: 96 mg/dl (ref 70–140)
Potassium: 4.2 mEq/L (ref 3.5–5.1)
Sodium: 145 mEq/L (ref 136–145)
Total Bilirubin: 0.27 mg/dL (ref 0.20–1.20)
Total Protein: 7.1 g/dL (ref 6.4–8.3)

## 2013-02-15 LAB — CBC WITH DIFFERENTIAL/PLATELET
BASO%: 0.3 % (ref 0.0–2.0)
Basophils Absolute: 0.1 10*3/uL (ref 0.0–0.1)
EOS%: 0.9 % (ref 0.0–7.0)
Eosinophils Absolute: 0.2 10*3/uL (ref 0.0–0.5)
HCT: 42 % (ref 34.8–46.6)
HGB: 13.7 g/dL (ref 11.6–15.9)
LYMPH%: 68 % — ABNORMAL HIGH (ref 14.0–49.7)
MCH: 26.7 pg (ref 25.1–34.0)
MCHC: 32.7 g/dL (ref 31.5–36.0)
MCV: 81.6 fL (ref 79.5–101.0)
MONO#: 0.6 10*3/uL (ref 0.1–0.9)
MONO%: 2.9 % (ref 0.0–14.0)
NEUT#: 5.6 10*3/uL (ref 1.5–6.5)
NEUT%: 27.9 % — ABNORMAL LOW (ref 38.4–76.8)
Platelets: 220 10*3/uL (ref 145–400)
RBC: 5.15 10*6/uL (ref 3.70–5.45)
RDW: 15.9 % — ABNORMAL HIGH (ref 11.2–14.5)
WBC: 20.2 10*3/uL — ABNORMAL HIGH (ref 3.9–10.3)
lymph#: 13.7 10*3/uL — ABNORMAL HIGH (ref 0.9–3.3)

## 2013-02-15 LAB — TECHNOLOGIST REVIEW

## 2013-02-15 LAB — LACTATE DEHYDROGENASE (CC13): LDH: 242 U/L (ref 125–245)

## 2013-02-18 LAB — BETA 2 MICROGLOBULIN, SERUM: Beta-2 Microglobulin: 2.13 mg/L — ABNORMAL HIGH (ref 1.01–1.73)

## 2013-02-22 ENCOUNTER — Telehealth: Payer: Self-pay | Admitting: Oncology

## 2013-02-22 ENCOUNTER — Ambulatory Visit (HOSPITAL_BASED_OUTPATIENT_CLINIC_OR_DEPARTMENT_OTHER): Payer: Medicare Other | Admitting: Oncology

## 2013-02-22 VITALS — BP 164/88 | HR 89 | Temp 97.8°F | Resp 20 | Ht 60.0 in | Wt 204.9 lb

## 2013-02-22 DIAGNOSIS — C9110 Chronic lymphocytic leukemia of B-cell type not having achieved remission: Secondary | ICD-10-CM | POA: Diagnosis not present

## 2013-02-22 NOTE — Telephone Encounter (Signed)
gv and printed appt sched and avs for pt  °

## 2013-02-22 NOTE — Progress Notes (Signed)
Hematology and Oncology Follow Up Visit  Sheryl [name] [id number] 04-25-1946 67 y.o. 02/22/2013 6:57 PM   Principle Diagnosis: Encounter Diagnosis  Name Primary?  . CLL Yes     Interim History:  This is the first time I am seeing this pleasant 67 year old woman formerly followed by Dr. Donnie Coffin. She has stage 0 chronic lymphocytic leukemia diagnosed sometime before August of 2003. She has never required any treatment. She has developed a number of other autoimmune conditions over the years. She was diagnosed with sarcoidosis in the 50s when she was about 67 years old. She presented with neurologic symptoms including unexplained falling and vomiting episodes. She was referred to [facility]. 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 She underwent a meningeal biopsy to establish the diagnosis. She was treated with steroids and cyclosporin. She has had visual and hearing deficits since diagnosis. She has developed fibromyalgia symptoms over the years followed by Dr. Harriet Masson low, rheumatology. She has sleep apnea but does not like to wear a CPAP mask since it causes claustrophobia.  She has not had any problem with recurrent or severe infections. She has never had an episode of herpes zoster infection.  She has degenerative arthritis of the spine and had back surgery by Dr. Noel Gerold in January 2014.   Medications: reviewed  Allergies: No Known Allergies  Review of Systems: Constitutional:   Chronic fatigue  HEENTNo sore throat  Respiratory:No cough or dyspnea  Cardiovascular: No chest pain or palpitations  Gastrointestinal:No abdominal pain or change in bowel habit  Genito-Urinary: No urinary tract symptoms. No hematuria. No vaginal bleeding.  Musculoskeletal:Chronic myalgia and arthralgia with arthralgias being more prominent  Neurologic:Currently no headaches. Chronic decrease in vision and hearing right side.  Skin:No rash or ecchymosis   Vascular: Hematology: Remaining ROS negative.     Physical Exam: Blood pressure 164/88, pulse 89, temperature 97.8 F (36.6 C), temperature source Oral, resp. rate 20, height 5' (1.524 m), weight 204 lb 14.4 oz (92.942 kg). Wt Readings from Last 3 Encounters:  02/22/13 204 lb 14.4 oz (92.942 kg)  10/16/12 202 lb (91.627 kg)  08/24/12 199 lb 11.2 oz (90.583 kg)     General appearance: Overweight Caucasian woman  HENNT: Pharynx no erythema or exudate. Symmetrically enlarged thyroid.  Lymph nodes: No lymphadenopathy  Breasts: Lungs:Clear to auscultation resonant to percussion  Heart:Regular rhythm no murmur  Abdomen:Soft, nontender, no mass, no organomegaly  Extremities:1+ pedal edema, no calf tenderness  Musculoskeletal:Osteoarthritic changes of the fingers  GU: Vascular:No carotid bruits, no cyanosis  Neurologic:Mental status intact, hearing and vision decreased right side, motor strength 5 over 5, reflexes 3+ symmetric, no clonus, PERRLA, optic discs sharp. Moderate to severe decrease in vibration sensation over the fingertips by tuning fork exam.  Skin:No rash or ecchymosis  Lab Results: Lab Results: White count differential: 28 neutrophils, 68 lymphocytes, 3 monocytes   Component Value Date   WBC 20.2* 02/15/2013   HGB 13.7 02/15/2013   HCT 42.0 02/15/2013   MCV 81.6 02/15/2013   PLT 220 02/15/2013     Chemistry      Component Value Date/Time   NA 145 02/15/2013 1101   NA 143 02/10/2012 1051   K 4.2 02/15/2013 1101   K 4.0 02/10/2012 1051   CL 105 08/24/2012 1202   CL 106 02/10/2012 1051   CO2 24 02/15/2013 1101   CO2 27 02/10/2012 1051   BUN 20.7 02/15/2013 1101   BUN 23 02/10/2012 1051  CREATININE 0.9 02/15/2013 1101   CREATININE 1.07 02/10/2012 1051      Component Value Date/Time   CALCIUM 10.2 02/15/2013 1101   CALCIUM 8.8 02/10/2012 1051   ALKPHOS 142 02/15/2013 1101   ALKPHOS 100 02/10/2012 1051   AST 20 02/15/2013 1101   AST 19 02/10/2012 1051   ALT 20 02/15/2013  1101   ALT 29 02/10/2012 1051   BILITOT 0.27 02/15/2013 1101   BILITOT 0.3 02/10/2012 1051      Impression: #1. Stage 0 chronic lymphocytic leukemia Stable, moderate, elevation of white count with stable lymphocytosis. She is reminded to get her annual flu vaccine and keep current on pneumonia vaccine. Plan: Continue periodic observation. We are seeing her every 6 months. Can probably decrease to annual.  #2. 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 Fibromyalgia syndrome  #4. Degenerative arthritis of the spine  #5. Sleep apnea  #6. Peripheral neuropathy     CC:. Dr. [name]; Dr. Vicki Mallet, Dr Clydie Braun, [facility]   Levert Feinstein, MD 8/29/20146:57 PM

## 2013-04-17 ENCOUNTER — Ambulatory Visit (INDEPENDENT_AMBULATORY_CARE_PROVIDER_SITE_OTHER): Payer: Medicare Other | Admitting: Internal Medicine

## 2013-04-17 ENCOUNTER — Encounter: Payer: Self-pay | Admitting: Internal Medicine

## 2013-04-17 VITALS — BP 150/80 | HR 92 | Temp 98.0°F | Resp 20 | Wt 208.0 lb

## 2013-04-17 DIAGNOSIS — E785 Hyperlipidemia, unspecified: Secondary | ICD-10-CM | POA: Diagnosis not present

## 2013-04-17 DIAGNOSIS — D8689 Sarcoidosis of other sites: Secondary | ICD-10-CM

## 2013-04-17 DIAGNOSIS — IMO0001 Reserved for inherently not codable concepts without codable children: Secondary | ICD-10-CM

## 2013-04-17 DIAGNOSIS — Z23 Encounter for immunization: Secondary | ICD-10-CM

## 2013-04-17 DIAGNOSIS — C9110 Chronic lymphocytic leukemia of B-cell type not having achieved remission: Secondary | ICD-10-CM | POA: Diagnosis not present

## 2013-04-17 DIAGNOSIS — I10 Essential (primary) hypertension: Secondary | ICD-10-CM | POA: Diagnosis not present

## 2013-04-17 DIAGNOSIS — D869 Sarcoidosis, unspecified: Secondary | ICD-10-CM

## 2013-04-17 NOTE — Progress Notes (Signed)
Subjective:    Patient ID: Sheryl [name], female    DOB: [birth year], 67 y.o.   MRN: [id number]  HPI  67 year old patient who is seen today for her biannual followup. She has seen hematology recently for followup of CLL. She has a history of neurosarcoidosis which has been stable. She has dyslipidemia and hypertension. She is followed by rheumatology due to fibromyalgia and DJD. No [facility] complaints today.  Past Medical History  Diagnosis Date  . 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 DEGENERATIVE JOINT DISEASE 12/14/2006  . EXOGENOUS OBESITY 12/14/2006  . HYPERLIPIDEMIA 12/14/2006  . HYPERTENSION 12/14/2006  . Neurosarcoidosis   . FIBROMYALGIA 12/14/2006  . Sleep apnea   . Hearing loss of left ear   . Vision loss of right eye   . IBS (irritable bowel syndrome)     History   Social History  . Marital Status: Married    Spouse Name: N/A    Number of Children: N/A  . Years of Education: N/A   Occupational History  . Not on file.   Social History Main Topics  . Smoking status: Never Smoker   . Smokeless tobacco: Never Used  . Alcohol Use: No     Comment: One drink per year  . Drug Use: No  . Sexual Activity: Not on file   Other Topics Concern  . Not on file   Social History Narrative  . No narrative on file    Past Surgical History  Procedure Laterality Date  . Cholecystectomy    . Tonsillectomy    . Spinal fusion  07/25/2012    Family History  Problem Relation Age of Onset  . Diabetes Sister     No Known Allergies  Current Outpatient Prescriptions on File Prior to Visit  Medication Sig Dispense Refill  . aspirin 81 MG tablet Take 81 mg by mouth daily.        Marland Kitchen atorvastatin (LIPITOR) 40 MG tablet Take 1 tablet (40 mg total) by mouth daily.  90 tablet  3  . cetirizine (ZYRTEC) 10 MG tablet Take 10 mg by mouth daily.        . Cholecalciferol (D3-1000) 1000 UNITS capsule Take 1,000 Units by mouth daily.      . cyclobenzaprine (FLEXERIL) 10 MG tablet Take 1 tablet (10 mg  total) by mouth 3 (three) times daily as needed.  60 tablet  3  . fluticasone (FLONASE) 50 MCG/ACT nasal spray Place 2 sprays into the nose daily.  16 g  6  . lisinopril (PRINIVIL,ZESTRIL) 40 MG tablet Take 1 tablet (40 mg total) by mouth daily.  90 tablet  3  . Multiple Vitamin (MULTIVITAMIN) tablet Take 1 tablet by mouth daily.        . nabumetone (RELAFEN) 500 MG tablet Take 1 tablet (500 mg total) by mouth 2 (two) times daily.  90 tablet  6  . omeprazole (PRILOSEC) 20 MG capsule Take 1 capsule (20 mg total) by mouth daily.  90 capsule  4  . SALINE NASAL SPRAY NA Place into the nose. As needed      . solifenacin (VESICARE) 10 MG tablet Take 1 tablet (10 mg total) by mouth daily.  90 tablet  4  . traMADol (ULTRAM) 50 MG tablet Take 1 tablet (50 mg total) by mouth every 6 (six) hours as needed.  90 tablet  4   No current facility-administered medications on file prior to visit.    BP 150/80  Pulse 92  Temp(Src) 98 F (  36.7 C) (Oral)  Resp 20  Wt 208 lb (94.348 kg)  BMI 40.62 kg/m2  SpO2 96%       Review of Systems  Constitutional: Negative.   HENT: Negative for congestion, dental problem, hearing loss, rhinorrhea, sinus pressure, sore throat and tinnitus.   Eyes: Negative for pain, discharge and visual disturbance.  Respiratory: Negative for cough and shortness of breath.   Cardiovascular: Negative for chest pain, palpitations and leg swelling.  Gastrointestinal: Negative for nausea, vomiting, abdominal pain, diarrhea, constipation, blood in stool and abdominal distention.  Genitourinary: Negative for dysuria, urgency, frequency, hematuria, flank pain, vaginal bleeding, vaginal discharge, difficulty urinating, vaginal pain and pelvic pain.  Musculoskeletal: Positive for arthralgias, back pain and gait problem. Negative for joint swelling.  Skin: Negative for rash.  Neurological: Negative for dizziness, syncope, speech difficulty, weakness, numbness and headaches.   Hematological: Negative for adenopathy.  Psychiatric/Behavioral: Negative for behavioral problems, dysphoric mood and agitation. The patient is not nervous/anxious.        Objective:   Physical Exam  Constitutional: She is oriented to person, place, and time. She appears well-developed and well-nourished.  Weights at 208. Repeat blood pressure 140/80  HENT:  Head: Normocephalic.  Mouth/Throat: Oropharynx is clear and moist.  Eyes: Conjunctivae and EOM are normal. Pupils are equal, round, and reactive to light.  Neck: Normal range of motion. Neck supple. No thyromegaly present.  Cardiovascular: Normal rate, regular rhythm, normal heart sounds and intact distal pulses.   Pulmonary/Chest: Effort normal and breath sounds normal.  Abdominal: Soft. Bowel sounds are normal. She exhibits no mass. There is no tenderness.  Musculoskeletal: Normal range of motion.  Trace pedal edema  Lymphadenopathy:    She has no cervical adenopathy.  Neurological: She is alert and oriented to person, place, and time.  Skin: Skin is warm and dry. No rash noted.  Psychiatric: She has a normal mood and affect. Her behavior is normal.          Assessment & Plan:   Hypertension. Reasonable control. We'll continue present regimen Dyslipidemia. Continue statin therapy Fibromyalgia. Followup rheumatology CLL stable

## 2013-04-17 NOTE — Patient Instructions (Signed)
Limit your sodium (Salt) intake  You need to lose weight.  Consider a lower calorie diet and regular exercise.  Return in 6 months for follow-up   

## 2013-04-25 ENCOUNTER — Other Ambulatory Visit: Payer: Self-pay | Admitting: Orthopaedic Surgery

## 2013-04-25 DIAGNOSIS — M25559 Pain in unspecified hip: Secondary | ICD-10-CM | POA: Diagnosis not present

## 2013-04-25 DIAGNOSIS — M4310 Spondylolisthesis, site unspecified: Secondary | ICD-10-CM | POA: Diagnosis not present

## 2013-04-25 DIAGNOSIS — M5412 Radiculopathy, cervical region: Secondary | ICD-10-CM

## 2013-04-25 DIAGNOSIS — IMO0001 Reserved for inherently not codable concepts without codable children: Secondary | ICD-10-CM | POA: Diagnosis not present

## 2013-04-25 DIAGNOSIS — M25569 Pain in unspecified knee: Secondary | ICD-10-CM | POA: Diagnosis not present

## 2013-05-05 ENCOUNTER — Ambulatory Visit
Admission: RE | Admit: 2013-05-05 | Discharge: 2013-05-05 | Disposition: A | Discharge status: Home / Self Care | Payer: Medicare Other | Source: Ambulatory Visit | Attending: Orthopaedic Surgery | Admitting: Orthopaedic Surgery

## 2013-05-05 DIAGNOSIS — M5412 Radiculopathy, cervical region: Secondary | ICD-10-CM

## 2013-05-05 DIAGNOSIS — M4802 Spinal stenosis, cervical region: Secondary | ICD-10-CM | POA: Diagnosis not present

## 2013-05-17 DIAGNOSIS — M5412 Radiculopathy, cervical region: Secondary | ICD-10-CM | POA: Diagnosis not present

## 2013-05-17 DIAGNOSIS — M5030 Other cervical disc degeneration, unspecified cervical region: Secondary | ICD-10-CM | POA: Diagnosis not present

## 2013-05-17 DIAGNOSIS — M4712 Other spondylosis with myelopathy, cervical region: Secondary | ICD-10-CM | POA: Diagnosis not present

## 2013-05-17 DIAGNOSIS — M542 Cervicalgia: Secondary | ICD-10-CM | POA: Diagnosis not present

## 2013-05-29 DIAGNOSIS — R197 Diarrhea, unspecified: Secondary | ICD-10-CM | POA: Diagnosis not present

## 2013-05-29 DIAGNOSIS — G4700 Insomnia, unspecified: Secondary | ICD-10-CM | POA: Diagnosis not present

## 2013-05-29 DIAGNOSIS — IMO0001 Reserved for inherently not codable concepts without codable children: Secondary | ICD-10-CM | POA: Diagnosis not present

## 2013-05-29 DIAGNOSIS — M542 Cervicalgia: Secondary | ICD-10-CM | POA: Diagnosis not present

## 2013-06-05 ENCOUNTER — Telehealth: Payer: Self-pay | Admitting: *Deleted

## 2013-06-05 DIAGNOSIS — R112 Nausea with vomiting, unspecified: Secondary | ICD-10-CM | POA: Diagnosis not present

## 2013-06-05 DIAGNOSIS — C9110 Chronic lymphocytic leukemia of B-cell type not having achieved remission: Secondary | ICD-10-CM | POA: Diagnosis not present

## 2013-06-05 DIAGNOSIS — Z79899 Other long term (current) drug therapy: Secondary | ICD-10-CM | POA: Diagnosis not present

## 2013-06-05 DIAGNOSIS — N390 Urinary tract infection, site not specified: Secondary | ICD-10-CM | POA: Diagnosis not present

## 2013-06-05 DIAGNOSIS — R6889 Other general symptoms and signs: Secondary | ICD-10-CM | POA: Diagnosis not present

## 2013-06-05 NOTE — Telephone Encounter (Signed)
Received call from pt stating that she is planning to have cervical back surg 06/18/13 & they plan to go through her neck & she needs a release to be sent to [facility] @ [facility] @ fax [phone].  Sheryl [name] can be reached at [phone].  Note to Dr Cyndie Chime.

## 2013-06-09 ENCOUNTER — Other Ambulatory Visit: Payer: Self-pay | Admitting: Internal Medicine

## 2013-06-10 ENCOUNTER — Other Ambulatory Visit: Payer: Self-pay | Admitting: *Deleted

## 2013-06-10 DIAGNOSIS — Z9889 Other specified postprocedural states: Secondary | ICD-10-CM | POA: Diagnosis not present

## 2013-06-10 DIAGNOSIS — I10 Essential (primary) hypertension: Secondary | ICD-10-CM | POA: Diagnosis not present

## 2013-06-10 DIAGNOSIS — IMO0001 Reserved for inherently not codable concepts without codable children: Secondary | ICD-10-CM | POA: Diagnosis present

## 2013-06-10 DIAGNOSIS — Z79899 Other long term (current) drug therapy: Secondary | ICD-10-CM | POA: Diagnosis not present

## 2013-06-10 DIAGNOSIS — J989 Respiratory disorder, unspecified: Secondary | ICD-10-CM | POA: Diagnosis not present

## 2013-06-10 DIAGNOSIS — A088 Other specified intestinal infections: Secondary | ICD-10-CM | POA: Diagnosis not present

## 2013-06-10 DIAGNOSIS — M4712 Other spondylosis with myelopathy, cervical region: Secondary | ICD-10-CM | POA: Diagnosis not present

## 2013-06-10 DIAGNOSIS — R0602 Shortness of breath: Secondary | ICD-10-CM | POA: Diagnosis not present

## 2013-06-10 DIAGNOSIS — K219 Gastro-esophageal reflux disease without esophagitis: Secondary | ICD-10-CM | POA: Diagnosis not present

## 2013-06-10 DIAGNOSIS — R197 Diarrhea, unspecified: Secondary | ICD-10-CM | POA: Diagnosis not present

## 2013-06-10 DIAGNOSIS — Z7982 Long term (current) use of aspirin: Secondary | ICD-10-CM | POA: Diagnosis not present

## 2013-06-10 DIAGNOSIS — R05 Cough: Secondary | ICD-10-CM | POA: Diagnosis present

## 2013-06-10 DIAGNOSIS — R110 Nausea: Secondary | ICD-10-CM | POA: Diagnosis not present

## 2013-06-10 DIAGNOSIS — M5000 Cervical disc disorder with myelopathy, unspecified cervical region: Secondary | ICD-10-CM | POA: Diagnosis not present

## 2013-06-10 DIAGNOSIS — Z981 Arthrodesis status: Secondary | ICD-10-CM | POA: Diagnosis not present

## 2013-06-10 DIAGNOSIS — M5030 Other cervical disc degeneration, unspecified cervical region: Secondary | ICD-10-CM | POA: Diagnosis not present

## 2013-06-10 DIAGNOSIS — R300 Dysuria: Secondary | ICD-10-CM | POA: Diagnosis not present

## 2013-06-10 DIAGNOSIS — D869 Sarcoidosis, unspecified: Secondary | ICD-10-CM | POA: Diagnosis present

## 2013-06-10 DIAGNOSIS — E785 Hyperlipidemia, unspecified: Secondary | ICD-10-CM | POA: Diagnosis present

## 2013-06-10 DIAGNOSIS — R059 Cough, unspecified: Secondary | ICD-10-CM | POA: Diagnosis not present

## 2013-06-10 DIAGNOSIS — M542 Cervicalgia: Secondary | ICD-10-CM | POA: Diagnosis not present

## 2013-06-10 DIAGNOSIS — C9590 Leukemia, unspecified not having achieved remission: Secondary | ICD-10-CM | POA: Diagnosis not present

## 2013-06-10 DIAGNOSIS — Z856 Personal history of leukemia: Secondary | ICD-10-CM | POA: Diagnosis not present

## 2013-06-10 MED ORDER — ATORVASTATIN CALCIUM 40 MG PO TABS: 40.0000 mg | ORAL_TABLET | Freq: Every day | ORAL | Status: DC

## 2013-06-12 ENCOUNTER — Telehealth: Payer: Self-pay | Admitting: *Deleted

## 2013-06-12 NOTE — Telephone Encounter (Signed)
Sheryl [name] with [name] [phone] is out of the office.  Left a message on her voice mail.  " This patient has early stage CLL.  She is not on treatment and is ok for surgery from a hematology standpoint.  However, they should get her PCP to clear her for general medical condition."   Let her know to call me for any further questions.

## 2013-06-17 ENCOUNTER — Encounter: Payer: Self-pay | Admitting: Family

## 2013-06-17 ENCOUNTER — Ambulatory Visit (INDEPENDENT_AMBULATORY_CARE_PROVIDER_SITE_OTHER): Payer: Medicare Other | Admitting: Family

## 2013-06-17 VITALS — BP 112/64 | HR 99 | Temp 97.4°F | Wt 200.0 lb

## 2013-06-17 DIAGNOSIS — R300 Dysuria: Secondary | ICD-10-CM | POA: Diagnosis not present

## 2013-06-17 DIAGNOSIS — R197 Diarrhea, unspecified: Secondary | ICD-10-CM | POA: Diagnosis not present

## 2013-06-17 DIAGNOSIS — R110 Nausea: Secondary | ICD-10-CM

## 2013-06-17 DIAGNOSIS — A084 Viral intestinal infection, unspecified: Secondary | ICD-10-CM

## 2013-06-17 DIAGNOSIS — A088 Other specified intestinal infections: Secondary | ICD-10-CM | POA: Diagnosis not present

## 2013-06-17 LAB — POCT URINALYSIS DIPSTICK
Bilirubin, UA: NEGATIVE
Glucose, UA: NEGATIVE
Ketones, UA: NEGATIVE
Nitrite, UA: NEGATIVE
Protein, UA: NEGATIVE
Spec Grav, UA: 1.01
Urobilinogen, UA: 0.2
pH, UA: 6.5

## 2013-06-17 NOTE — Patient Instructions (Signed)
Viral Gastroenteritis Viral gastroenteritis is also known as stomach flu. This condition affects the stomach and intestinal tract. It can cause sudden diarrhea and vomiting. The illness typically lasts 3 to 8 days. Most people develop an immune response that eventually gets rid of the virus. While this natural response develops, the virus can make you quite ill. CAUSES  Many different viruses can cause gastroenteritis, such as rotavirus or noroviruses. You can catch one of these viruses by consuming contaminated food or water. You may also catch a virus by sharing utensils or other personal items with an infected person or by touching a contaminated surface. SYMPTOMS  The most common symptoms are diarrhea and vomiting. These problems can cause a severe loss of body fluids (dehydration) and a body salt (electrolyte) imbalance. Other symptoms may include:  Fever.  Headache.  Fatigue.  Abdominal pain. DIAGNOSIS  Your caregiver can usually diagnose viral gastroenteritis based on your symptoms and a physical exam. A stool sample may also be taken to test for the presence of viruses or other infections. TREATMENT  This illness typically goes away on its own. Treatments are aimed at rehydration. The most serious cases of viral gastroenteritis involve vomiting so severely that you are not able to keep fluids down. In these cases, fluids must be given through an intravenous line (IV). HOME CARE INSTRUCTIONS   Drink enough fluids to keep your urine clear or pale yellow. Drink small amounts of fluids frequently and increase the amounts as tolerated.  Ask your caregiver for specific rehydration instructions.  Avoid:  Foods high in sugar.  Alcohol.  Carbonated drinks.  Tobacco.  Juice.  Caffeine drinks.  Extremely hot or cold fluids.  Fatty, greasy foods.  Too much intake of anything at one time.  Dairy products until 24 to 48 hours after diarrhea stops.  You may consume probiotics.  Probiotics are active cultures of beneficial bacteria. They may lessen the amount and number of diarrheal stools in adults. Probiotics can be found in yogurt with active cultures and in supplements.  Wash your hands well to avoid spreading the virus.  Only take over-the-counter or prescription medicines for pain, discomfort, or fever as directed by your caregiver. Do not give aspirin to children. Antidiarrheal medicines are not recommended.  Ask your caregiver if you should continue to take your regular prescribed and over-the-counter medicines.  Keep all follow-up appointments as directed by your caregiver. SEEK IMMEDIATE MEDICAL CARE IF:   You are unable to keep fluids down.  You do not urinate at least once every 6 to 8 hours.  You develop shortness of breath.  You notice blood in your stool or vomit. This may look like coffee grounds.  You have abdominal pain that increases or is concentrated in one small area (localized).  You have persistent vomiting or diarrhea.  You have a fever.  The patient is a child younger than 3 months, and he or she has a fever.  The patient is a child older than 3 months, and he or she has a fever and persistent symptoms.  The patient is a child older than 3 months, and he or she has a fever and symptoms suddenly get worse.  The patient is a baby, and he or she has no tears when crying. MAKE SURE YOU:   Understand these instructions.  Will watch your condition.  Will get help right away if you are not doing well or get worse. Document Released: 06/13/2005 Document Revised: 09/05/2011 Document Reviewed: 03/30/2011   [employer] Patient Information 2014 [employer].  

## 2013-06-17 NOTE — Addendum Note (Signed)
Addended by: Rita Ohara R on: 06/17/2013 04:59 PM   Modules accepted: Orders

## 2013-06-17 NOTE — Progress Notes (Signed)
Subjective:    Patient ID: Sheryl [name], female    DOB: [birth year], 67 y.o.   MRN: [id number]  HPI 67 year old white female, nonsmoker, is in today with c/o nausea, diarrhea x 1 days but improving. She has been taking Imodium that has helped. Has Zofran at home but has not taken it. Denies any blood in her stools, dark black stools. No fever. No abdominal pain   Review of Systems  Constitutional: Negative.   HENT: Negative.   Respiratory: Negative.   Cardiovascular: Negative.   Gastrointestinal: Positive for nausea and diarrhea.  Endocrine: Negative.   Genitourinary: Negative.   Musculoskeletal: Negative.   Skin: Negative.   Neurological: Negative.   Psychiatric/Behavioral: Negative.    Past Medical History  Diagnosis Date  . 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 DEGENERATIVE JOINT DISEASE 12/14/2006  . EXOGENOUS OBESITY 12/14/2006  . HYPERLIPIDEMIA 12/14/2006  . HYPERTENSION 12/14/2006  . Neurosarcoidosis   . FIBROMYALGIA 12/14/2006  . Sleep apnea   . Hearing loss of left ear   . Vision loss of right eye   . IBS (irritable bowel syndrome)     History   Social History  . Marital Status: Married    Spouse Name: N/A    Number of Children: N/A  . Years of Education: N/A   Occupational History  . Not on file.   Social History Main Topics  . Smoking status: Never Smoker   . Smokeless tobacco: Never Used  . Alcohol Use: No     Comment: One drink per year  . Drug Use: No  . Sexual Activity: Not on file   Other Topics Concern  . Not on file   Social History Narrative  . No narrative on file    Past Surgical History  Procedure Laterality Date  . Cholecystectomy    . Tonsillectomy    . Spinal fusion  07/25/2012    Family History  Problem Relation Age of Onset  . Diabetes Sister     No Known Allergies  Current Outpatient Prescriptions on File Prior to Visit  Medication Sig Dispense Refill  . aspirin 81 MG tablet Take 81 mg by mouth daily.        Marland Kitchen atorvastatin  (LIPITOR) 40 MG tablet TAKE ONE TABLET BY MOUTH EVERY DAY  90 tablet  1  . atorvastatin (LIPITOR) 40 MG tablet Take 1 tablet (40 mg total) by mouth daily.  90 tablet  3  . cetirizine (ZYRTEC) 10 MG tablet Take 10 mg by mouth daily.        . Cholecalciferol (D3-1000) 1000 UNITS capsule Take 1,000 Units by mouth daily.      . cyclobenzaprine (FLEXERIL) 10 MG tablet Take 1 tablet (10 mg total) by mouth 3 (three) times daily as needed.  60 tablet  3  . fluticasone (FLONASE) 50 MCG/ACT nasal spray Place 2 sprays into the nose daily.  16 g  6  . lisinopril (PRINIVIL,ZESTRIL) 40 MG tablet Take 1 tablet (40 mg total) by mouth daily.  90 tablet  3  . Multiple Vitamin (MULTIVITAMIN) tablet Take 1 tablet by mouth daily.        . nabumetone (RELAFEN) 500 MG tablet Take 1 tablet (500 mg total) by mouth 2 (two) times daily.  90 tablet  6  . omeprazole (PRILOSEC) 20 MG capsule Take 1 capsule (20 mg total) by mouth daily.  90 capsule  4  . SALINE NASAL SPRAY NA Place into the nose. As needed      .  solifenacin (VESICARE) 10 MG tablet Take 1 tablet (10 mg total) by mouth daily.  90 tablet  4  . traMADol (ULTRAM) 50 MG tablet Take 1 tablet (50 mg total) by mouth every 6 (six) hours as needed.  90 tablet  4   No current facility-administered medications on file prior to visit.    BP 112/64  Pulse 99  Temp(Src) 97.4 F (36.3 C) (Oral)  Wt 200 lb (90.719 kg)chart     Objective:   Physical Exam  Constitutional: She is oriented to person, place, and time. She appears well-developed and well-nourished.  Neck: Normal range of motion. Neck supple.  Cardiovascular: Normal rate, regular rhythm and normal heart sounds.   Pulmonary/Chest: Effort normal and breath sounds normal.  Abdominal: Soft. Bowel sounds are normal.  Musculoskeletal: Normal range of motion.  Neurological: She is alert and oriented to person, place, and time.  Skin: Skin is warm and dry.  Psychiatric: She has a normal mood and affect.           Assessment & Plan:  Assessment: 1.Viral Gastroenteritis  2. Nausea 3. Diarrhea  Plan: Increase fluid intake. Zofran as needed. Advised patient to notify surgeon of her illness. She is due to undergo surgery tomorrow.

## 2013-06-19 LAB — URINE CULTURE
Colony Count: NO GROWTH
Organism ID, Bacteria: NO GROWTH

## 2013-06-26 DIAGNOSIS — R05 Cough: Secondary | ICD-10-CM | POA: Diagnosis not present

## 2013-06-26 DIAGNOSIS — R059 Cough, unspecified: Secondary | ICD-10-CM | POA: Diagnosis not present

## 2013-06-26 DIAGNOSIS — I10 Essential (primary) hypertension: Secondary | ICD-10-CM | POA: Diagnosis not present

## 2013-06-26 DIAGNOSIS — E785 Hyperlipidemia, unspecified: Secondary | ICD-10-CM | POA: Diagnosis not present

## 2013-06-26 DIAGNOSIS — Z856 Personal history of leukemia: Secondary | ICD-10-CM | POA: Diagnosis not present

## 2013-06-26 DIAGNOSIS — IMO0001 Reserved for inherently not codable concepts without codable children: Secondary | ICD-10-CM | POA: Diagnosis not present

## 2013-06-26 DIAGNOSIS — D869 Sarcoidosis, unspecified: Secondary | ICD-10-CM | POA: Diagnosis not present

## 2013-06-26 DIAGNOSIS — R0602 Shortness of breath: Secondary | ICD-10-CM | POA: Diagnosis not present

## 2013-06-27 DIAGNOSIS — D869 Sarcoidosis, unspecified: Secondary | ICD-10-CM | POA: Diagnosis not present

## 2013-06-27 DIAGNOSIS — E785 Hyperlipidemia, unspecified: Secondary | ICD-10-CM | POA: Diagnosis not present

## 2013-06-27 DIAGNOSIS — IMO0001 Reserved for inherently not codable concepts without codable children: Secondary | ICD-10-CM | POA: Diagnosis not present

## 2013-06-27 DIAGNOSIS — Z856 Personal history of leukemia: Secondary | ICD-10-CM | POA: Diagnosis not present

## 2013-06-27 DIAGNOSIS — R059 Cough, unspecified: Secondary | ICD-10-CM | POA: Diagnosis not present

## 2013-06-27 DIAGNOSIS — I10 Essential (primary) hypertension: Secondary | ICD-10-CM | POA: Diagnosis not present

## 2013-07-05 ENCOUNTER — Telehealth: Payer: Self-pay | Admitting: Oncology

## 2013-07-05 NOTE — Telephone Encounter (Signed)
lisa on pal 2/27. appt moved from 2/27 to 2/20. lmonvm for pt and mailed schedule.

## 2013-07-16 DIAGNOSIS — M4712 Other spondylosis with myelopathy, cervical region: Secondary | ICD-10-CM | POA: Diagnosis not present

## 2013-07-16 DIAGNOSIS — M542 Cervicalgia: Secondary | ICD-10-CM | POA: Diagnosis not present

## 2013-07-24 DIAGNOSIS — H5340 Unspecified visual field defects: Secondary | ICD-10-CM | POA: Diagnosis not present

## 2013-07-24 DIAGNOSIS — H2510 Age-related nuclear cataract, unspecified eye: Secondary | ICD-10-CM | POA: Diagnosis not present

## 2013-07-24 DIAGNOSIS — H468 Other optic neuritis: Secondary | ICD-10-CM | POA: Diagnosis not present

## 2013-07-26 ENCOUNTER — Telehealth: Payer: Self-pay | Admitting: Internal Medicine

## 2013-07-26 NOTE — Telephone Encounter (Signed)
Left message on voicemail to call office.  

## 2013-07-26 NOTE — Telephone Encounter (Signed)
Pt husband is calling to see if meds nabumetone 500 mg and atorvastatin 40 mg comes in a smaller pill, states wife had surgery on her neck and is having trouble swallowing those two meds.

## 2013-07-26 NOTE — Telephone Encounter (Signed)
Left detailed message okay to cut medications in half with pill cutter. Call if any questions.

## 2013-08-16 ENCOUNTER — Ambulatory Visit (HOSPITAL_BASED_OUTPATIENT_CLINIC_OR_DEPARTMENT_OTHER): Payer: Medicare Other | Admitting: Nurse Practitioner

## 2013-08-16 ENCOUNTER — Telehealth: Payer: Self-pay | Admitting: Oncology

## 2013-08-16 ENCOUNTER — Other Ambulatory Visit (HOSPITAL_BASED_OUTPATIENT_CLINIC_OR_DEPARTMENT_OTHER): Payer: Medicare Other

## 2013-08-16 VITALS — BP 135/68 | HR 83 | Temp 97.3°F | Resp 17 | Ht 60.0 in | Wt 190.7 lb

## 2013-08-16 DIAGNOSIS — G609 Hereditary and idiopathic neuropathy, unspecified: Secondary | ICD-10-CM | POA: Diagnosis not present

## 2013-08-16 DIAGNOSIS — L989 Disorder of the skin and subcutaneous tissue, unspecified: Secondary | ICD-10-CM

## 2013-08-16 DIAGNOSIS — C9110 Chronic lymphocytic leukemia of B-cell type not having achieved remission: Secondary | ICD-10-CM

## 2013-08-16 DIAGNOSIS — D869 Sarcoidosis, unspecified: Secondary | ICD-10-CM | POA: Diagnosis not present

## 2013-08-16 LAB — COMPREHENSIVE METABOLIC PANEL (CC13)
ALT: 16 U/L (ref 0–55)
AST: 20 U/L (ref 5–34)
Albumin: 4.2 g/dL (ref 3.5–5.0)
Alkaline Phosphatase: 163 U/L — ABNORMAL HIGH (ref 40–150)
Anion Gap: 11 mEq/L (ref 3–11)
BUN: 15.7 mg/dL (ref 7.0–26.0)
CO2: 28 mEq/L (ref 22–29)
Calcium: 10.2 mg/dL (ref 8.4–10.4)
Chloride: 106 mEq/L (ref 98–109)
Creatinine: 0.8 mg/dL (ref 0.6–1.1)
Glucose: 87 mg/dl (ref 70–140)
Potassium: 3.9 mEq/L (ref 3.5–5.1)
Sodium: 144 mEq/L (ref 136–145)
Total Bilirubin: 0.29 mg/dL (ref 0.20–1.20)
Total Protein: 7 g/dL (ref 6.4–8.3)

## 2013-08-16 LAB — CBC WITH DIFFERENTIAL/PLATELET
BASO%: 0.3 % (ref 0.0–2.0)
Basophils Absolute: 0.1 10*3/uL (ref 0.0–0.1)
EOS%: 0.9 % (ref 0.0–7.0)
Eosinophils Absolute: 0.2 10*3/uL (ref 0.0–0.5)
HCT: 41.5 % (ref 34.8–46.6)
HGB: 13.3 g/dL (ref 11.6–15.9)
LYMPH%: 71.3 % — ABNORMAL HIGH (ref 14.0–49.7)
MCH: 26.8 pg (ref 25.1–34.0)
MCHC: 32.1 g/dL (ref 31.5–36.0)
MCV: 83.4 fL (ref 79.5–101.0)
MONO#: 0.6 10*3/uL (ref 0.1–0.9)
MONO%: 2.7 % (ref 0.0–14.0)
NEUT#: 5.3 10*3/uL (ref 1.5–6.5)
NEUT%: 24.8 % — ABNORMAL LOW (ref 38.4–76.8)
Platelets: 280 10*3/uL (ref 145–400)
RBC: 4.98 10*6/uL (ref 3.70–5.45)
RDW: 15.4 % — ABNORMAL HIGH (ref 11.2–14.5)
WBC: 21.2 10*3/uL — ABNORMAL HIGH (ref 3.9–10.3)
lymph#: 15.1 10*3/uL — ABNORMAL HIGH (ref 0.9–3.3)

## 2013-08-16 LAB — TECHNOLOGIST REVIEW

## 2013-08-16 NOTE — Telephone Encounter (Signed)
Gave pt appt for August 2015 lab and MD , a former Dr. Beryle Beams pt will see dermatology Dr Elvera Lennox on 2/23 1:45, referral given to HIM

## 2013-08-16 NOTE — Progress Notes (Signed)
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 She has never required treatment.  She is seen today for scheduled followup. She reports undergoing back surgery in December 2014. She has a good appetite. She has lost about 10 pounds since the back surgery. She attributes this to difficulty swallowing following the surgery. No fevers or sweats. She has not noticed any enlarged lymph nodes. No interim illnesses or infections. About 2 weeks ago her hairdresser noted a lesion at the left scalp and recommended she have it evaluated.   Objective: Filed Vitals:   08/16/13 1202  BP: 135/68  Pulse: 83  Temp: 97.3 F (36.3 C)  Resp: 17   Oropharynx is without thrush or ulceration. No palpable cervical, supraclavicular or axillary lymph nodes. Lungs are clear. No wheezes or rales. Regular cardiac rhythm. No murmur. Abdomen is soft and nontender. No organomegaly. No leg edema. Calves soft and nontender. At the left anterior scalp there is a flat, hyperpigmented, approximate 4 mm lesion with an irregular border.   Lab Results: Lab Results  Component Value Date   WBC 21.2* 08/16/2013   HGB 13.3 08/16/2013   HCT 41.5 08/16/2013   MCV 83.4 08/16/2013   PLT 280 08/16/2013   NEUTROABS 5.3 08/16/2013    Chemistry:    Chemistry      Component Value Date/Time   NA 144 08/16/2013 1134   NA 143 02/10/2012 1051   K 3.9 08/16/2013 1134   K 4.0 02/10/2012 1051   CL 105 08/24/2012 1202   CL 106 02/10/2012 1051   CO2 28 08/16/2013 1134   CO2 27 02/10/2012 1051   BUN 15.7 08/16/2013 1134   BUN 23 02/10/2012 1051   CREATININE 0.8 08/16/2013 1134   CREATININE 1.07 02/10/2012 1051      Component Value Date/Time   CALCIUM 10.2 08/16/2013 1134   CALCIUM 8.8 02/10/2012 1051   ALKPHOS 163* 08/16/2013 1134   ALKPHOS 100 02/10/2012 1051   AST 20 08/16/2013 1134   AST 19 02/10/2012 1051   ALT 16 08/16/2013 1134   ALT 29 02/10/2012 1051   BILITOT 0.29 08/16/2013 1134   BILITOT 0.3 02/10/2012 1051       Studies/Results: No results found.  Medications: I have reviewed the patient's current medications.  Assessment/Plan: 1. Stage 0 CLL. 2. Sarcoid with central nervous system involvement. 3. Fibromyalgia syndrome. 4. Degenerative arthritis of the spine. 5. Sleep apnea. 6. Peripheral neuropathy. 7. Hyperpigmented lesion left scalp.   Dispositon-she remains stable from a hematologic standpoint. She will return for labs and a followup visit in 6 months. She understands the importance of keeping up-to-date on the influenza and pneumonia vaccines.  We made a referral to dermatology to evaluate the scalp lesion.   Ned Card ANP/GNP-BC

## 2013-08-19 ENCOUNTER — Other Ambulatory Visit: Payer: Self-pay | Admitting: Dermatology

## 2013-08-19 DIAGNOSIS — D234 Other benign neoplasm of skin of scalp and neck: Secondary | ICD-10-CM | POA: Diagnosis not present

## 2013-08-23 ENCOUNTER — Other Ambulatory Visit: Payer: Medicare Other

## 2013-08-23 ENCOUNTER — Ambulatory Visit: Payer: Medicare Other | Admitting: Nurse Practitioner

## 2013-08-24 ENCOUNTER — Encounter: Payer: Self-pay | Admitting: Oncology

## 2013-09-11 DIAGNOSIS — M542 Cervicalgia: Secondary | ICD-10-CM | POA: Diagnosis not present

## 2013-09-11 DIAGNOSIS — M5030 Other cervical disc degeneration, unspecified cervical region: Secondary | ICD-10-CM | POA: Diagnosis not present

## 2013-09-11 DIAGNOSIS — M5412 Radiculopathy, cervical region: Secondary | ICD-10-CM | POA: Diagnosis not present

## 2013-09-11 DIAGNOSIS — M4712 Other spondylosis with myelopathy, cervical region: Secondary | ICD-10-CM | POA: Diagnosis not present

## 2013-09-18 ENCOUNTER — Other Ambulatory Visit: Payer: Self-pay | Admitting: Internal Medicine

## 2013-09-23 DIAGNOSIS — M6281 Muscle weakness (generalized): Secondary | ICD-10-CM | POA: Diagnosis not present

## 2013-09-23 DIAGNOSIS — M5380 Other specified dorsopathies, site unspecified: Secondary | ICD-10-CM | POA: Diagnosis not present

## 2013-09-23 DIAGNOSIS — R262 Difficulty in walking, not elsewhere classified: Secondary | ICD-10-CM | POA: Diagnosis not present

## 2013-09-23 DIAGNOSIS — M542 Cervicalgia: Secondary | ICD-10-CM | POA: Diagnosis not present

## 2013-09-23 DIAGNOSIS — IMO0001 Reserved for inherently not codable concepts without codable children: Secondary | ICD-10-CM | POA: Diagnosis not present

## 2013-10-03 DIAGNOSIS — M5380 Other specified dorsopathies, site unspecified: Secondary | ICD-10-CM | POA: Diagnosis not present

## 2013-10-03 DIAGNOSIS — IMO0001 Reserved for inherently not codable concepts without codable children: Secondary | ICD-10-CM | POA: Diagnosis not present

## 2013-10-03 DIAGNOSIS — M6281 Muscle weakness (generalized): Secondary | ICD-10-CM | POA: Diagnosis not present

## 2013-10-03 DIAGNOSIS — R262 Difficulty in walking, not elsewhere classified: Secondary | ICD-10-CM | POA: Diagnosis not present

## 2013-10-03 DIAGNOSIS — M542 Cervicalgia: Secondary | ICD-10-CM | POA: Diagnosis not present

## 2013-10-07 DIAGNOSIS — M5380 Other specified dorsopathies, site unspecified: Secondary | ICD-10-CM | POA: Diagnosis not present

## 2013-10-07 DIAGNOSIS — IMO0001 Reserved for inherently not codable concepts without codable children: Secondary | ICD-10-CM | POA: Diagnosis not present

## 2013-10-07 DIAGNOSIS — R262 Difficulty in walking, not elsewhere classified: Secondary | ICD-10-CM | POA: Diagnosis not present

## 2013-10-07 DIAGNOSIS — M6281 Muscle weakness (generalized): Secondary | ICD-10-CM | POA: Diagnosis not present

## 2013-10-07 DIAGNOSIS — M542 Cervicalgia: Secondary | ICD-10-CM | POA: Diagnosis not present

## 2013-10-10 DIAGNOSIS — R262 Difficulty in walking, not elsewhere classified: Secondary | ICD-10-CM | POA: Diagnosis not present

## 2013-10-10 DIAGNOSIS — M6281 Muscle weakness (generalized): Secondary | ICD-10-CM | POA: Diagnosis not present

## 2013-10-10 DIAGNOSIS — IMO0001 Reserved for inherently not codable concepts without codable children: Secondary | ICD-10-CM | POA: Diagnosis not present

## 2013-10-10 DIAGNOSIS — M542 Cervicalgia: Secondary | ICD-10-CM | POA: Diagnosis not present

## 2013-10-10 DIAGNOSIS — M5380 Other specified dorsopathies, site unspecified: Secondary | ICD-10-CM | POA: Diagnosis not present

## 2013-10-14 DIAGNOSIS — IMO0001 Reserved for inherently not codable concepts without codable children: Secondary | ICD-10-CM | POA: Diagnosis not present

## 2013-10-14 DIAGNOSIS — R262 Difficulty in walking, not elsewhere classified: Secondary | ICD-10-CM | POA: Diagnosis not present

## 2013-10-14 DIAGNOSIS — M6281 Muscle weakness (generalized): Secondary | ICD-10-CM | POA: Diagnosis not present

## 2013-10-14 DIAGNOSIS — M5380 Other specified dorsopathies, site unspecified: Secondary | ICD-10-CM | POA: Diagnosis not present

## 2013-10-14 DIAGNOSIS — M542 Cervicalgia: Secondary | ICD-10-CM | POA: Diagnosis not present

## 2013-10-15 ENCOUNTER — Encounter: Payer: Self-pay | Admitting: Internal Medicine

## 2013-10-15 ENCOUNTER — Ambulatory Visit (INDEPENDENT_AMBULATORY_CARE_PROVIDER_SITE_OTHER): Payer: Medicare Other | Admitting: Internal Medicine

## 2013-10-15 VITALS — BP 160/90 | HR 84 | Temp 97.5°F | Resp 20 | Ht 60.0 in | Wt 191.0 lb

## 2013-10-15 DIAGNOSIS — M1990 Unspecified osteoarthritis, unspecified site: Secondary | ICD-10-CM | POA: Diagnosis not present

## 2013-10-15 DIAGNOSIS — I10 Essential (primary) hypertension: Secondary | ICD-10-CM

## 2013-10-15 DIAGNOSIS — E785 Hyperlipidemia, unspecified: Secondary | ICD-10-CM

## 2013-10-15 DIAGNOSIS — IMO0001 Reserved for inherently not codable concepts without codable children: Secondary | ICD-10-CM | POA: Diagnosis not present

## 2013-10-15 DIAGNOSIS — C9110 Chronic lymphocytic leukemia of B-cell type not having achieved remission: Secondary | ICD-10-CM

## 2013-10-15 MED ORDER — LISINOPRIL 40 MG PO TABS: ORAL_TABLET | ORAL | Status: DC

## 2013-10-15 NOTE — Progress Notes (Signed)
   Subjective:    Patient ID: Sheryl [name], female    DOB: [birth year], 68 y.o.   MRN: [id number]  HPI Wt Readings from Last 3 Encounters:  10/15/13 191 lb (86.637 kg)  08/16/13 190 lb 11.2 oz (86.501 kg)  06/17/13 200 lb (90.719 kg)     Review of Systems     Objective:   Physical Exam        Assessment & Plan:

## 2013-10-15 NOTE — Progress Notes (Signed)
Pre-visit discussion using our clinic review tool. No additional management support is needed unless otherwise documented below in the visit note.  

## 2013-10-15 NOTE — Patient Instructions (Signed)
Limit your sodium (Salt) intake  You need to lose weight.  Consider a lower calorie diet and regular exercise.    It is important that you exercise regularly, at least 20 minutes 3 to 4 times per week.  If you develop chest pain or shortness of breath seek  medical attention.  Please check your blood pressure on a regular basis.  If it is consistently greater than 150/90, please make an office appointment.  Return in 6 months for follow-up

## 2013-10-15 NOTE — Progress Notes (Signed)
Subjective:    Patient ID: Sheryl [name], female    DOB: May 23, 1946, 68 y.o.   MRN: [id number]  HPI  68 year old patient who is seen today for her biannual followup.  She has CLL and was seen earlier this year by oncology.  In December of last year.  She underwent cervical laminectomy.  She has done well, but has had some expected swelling difficulties that are improving.  She has hypertension and dyslipidemia.  She has a history of neurosarcoidosis.  Otherwise, quite well.  She is accompanied by her husband..  No new concerns or complaints.  Past Medical History  Diagnosis Date  . 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 DEGENERATIVE JOINT DISEASE 12/14/2006  . EXOGENOUS OBESITY 12/14/2006  . HYPERLIPIDEMIA 12/14/2006  . HYPERTENSION 12/14/2006  . Neurosarcoidosis   . FIBROMYALGIA 12/14/2006  . Sleep apnea   . Hearing loss of left ear   . Vision loss of right eye   . IBS (irritable bowel syndrome)     History   Social History  . Marital Status: Married    Spouse Name: N/A    Number of Children: N/A  . Years of Education: N/A   Occupational History  . Not on file.   Social History Main Topics  . Smoking status: Never Smoker   . Smokeless tobacco: Never Used  . Alcohol Use: No     Comment: One drink per year  . Drug Use: No  . Sexual Activity: Not on file   Other Topics Concern  . Not on file   Social History Narrative  . No narrative on file    Past Surgical History  Procedure Laterality Date  . Cholecystectomy    . Tonsillectomy    . Spinal fusion  07/25/2012    Family History  Problem Relation Age of Onset  . Diabetes Sister     No Known Allergies  Current Outpatient Prescriptions on File Prior to Visit  Medication Sig Dispense Refill  . aspirin 81 MG tablet Take 81 mg by mouth daily.        Marland Kitchen atorvastatin (LIPITOR) 40 MG tablet Take 1 tablet (40 mg total) by mouth daily.  90 tablet  3  . cetirizine (ZYRTEC) 10 MG tablet Take 10 mg by mouth daily.        . Cholecalciferol  (D3-1000) 1000 UNITS capsule Take 1,000 Units by mouth daily.      . cyclobenzaprine (FLEXERIL) 10 MG tablet Take 1 tablet (10 mg total) by mouth 3 (three) times daily as needed.  60 tablet  3  . Multiple Vitamin (MULTIVITAMIN) tablet Take 1 tablet by mouth daily.        . nabumetone (RELAFEN) 500 MG tablet Take 1 tablet (500 mg total) by mouth 2 (two) times daily.  90 tablet  6  . omeprazole (PRILOSEC) 20 MG capsule Take 1 capsule (20 mg total) by mouth daily.  90 capsule  4  . ondansetron (ZOFRAN) 8 MG tablet       . traMADol (ULTRAM) 50 MG tablet Take 1 tablet (50 mg total) by mouth every 6 (six) hours as needed.  90 tablet  4   No current facility-administered medications on file prior to visit.    BP 160/90  Pulse 84  Temp(Src) 97.5 F (36.4 C) (Oral)  Resp 20  Ht 5' (1.524 m)  Wt 191 lb (86.637 kg)  BMI 37.30 kg/m2  SpO2 98%    Review of Systems  Constitutional: Negative.   HENT: Negative  for congestion, dental problem, hearing loss, rhinorrhea, sinus pressure, sore throat and tinnitus.   Eyes: Negative for pain, discharge and visual disturbance.  Respiratory: Negative for cough and shortness of breath.   Cardiovascular: Negative for chest pain, palpitations and leg swelling.  Gastrointestinal: Negative for nausea, vomiting, abdominal pain, diarrhea, constipation, blood in stool and abdominal distention.  Genitourinary: Positive for urgency and frequency. Negative for dysuria, hematuria, flank pain, vaginal bleeding, vaginal discharge, difficulty urinating, vaginal pain and pelvic pain.  Musculoskeletal: Negative for arthralgias, gait problem and joint swelling.  Skin: Negative for rash.  Neurological: Negative for dizziness, syncope, speech difficulty, weakness, numbness and headaches.  Hematological: Negative for adenopathy.  Psychiatric/Behavioral: Negative for behavioral problems, dysphoric mood and agitation. The patient is not nervous/anxious.        Objective:    Physical Exam  Constitutional: She is oriented to person, place, and time. She appears well-developed and well-nourished.  Repeat blood pressure still 160 over 90  HENT:  Head: Normocephalic.  Right Ear: External ear normal.  Left Ear: External ear normal.  Mouth/Throat: Oropharynx is clear and moist.  Eyes: Conjunctivae and EOM are normal. Pupils are equal, round, and reactive to light.  Neck: Normal range of motion. Neck supple. No thyromegaly present.  Cardiovascular: Normal rate, regular rhythm, normal heart sounds and intact distal pulses.   Pulmonary/Chest: Effort normal and breath sounds normal.  Abdominal: Soft. Bowel sounds are normal. She exhibits no mass. There is no tenderness.  Musculoskeletal: Normal range of motion.  Lymphadenopathy:    She has no cervical adenopathy.  Neurological: She is alert and oriented to person, place, and time.  Skin: Skin is warm and dry. No rash noted.  Psychiatric: She has a normal mood and affect. Her behavior is normal.          Assessment & Plan:   Hypertension, under control.  Patient does have a home blood pressure monitor will track over the next couple weeks to consider additional medication.  Continue efforts at weight loss Status post cervical laminectomy for spinal stenosis.  Continue physical therapy Dyslipidemia.  Continue atorvastatin.  CPX and lipid profile in 6 months DJD CLL, stable Exogenous obesity, improved

## 2013-10-17 DIAGNOSIS — M6281 Muscle weakness (generalized): Secondary | ICD-10-CM | POA: Diagnosis not present

## 2013-10-17 DIAGNOSIS — R262 Difficulty in walking, not elsewhere classified: Secondary | ICD-10-CM | POA: Diagnosis not present

## 2013-10-17 DIAGNOSIS — M5380 Other specified dorsopathies, site unspecified: Secondary | ICD-10-CM | POA: Diagnosis not present

## 2013-10-17 DIAGNOSIS — M542 Cervicalgia: Secondary | ICD-10-CM | POA: Diagnosis not present

## 2013-10-17 DIAGNOSIS — IMO0001 Reserved for inherently not codable concepts without codable children: Secondary | ICD-10-CM | POA: Diagnosis not present

## 2013-10-21 DIAGNOSIS — IMO0001 Reserved for inherently not codable concepts without codable children: Secondary | ICD-10-CM | POA: Diagnosis not present

## 2013-10-21 DIAGNOSIS — M542 Cervicalgia: Secondary | ICD-10-CM | POA: Diagnosis not present

## 2013-10-21 DIAGNOSIS — M6281 Muscle weakness (generalized): Secondary | ICD-10-CM | POA: Diagnosis not present

## 2013-10-21 DIAGNOSIS — M5380 Other specified dorsopathies, site unspecified: Secondary | ICD-10-CM | POA: Diagnosis not present

## 2013-10-21 DIAGNOSIS — R262 Difficulty in walking, not elsewhere classified: Secondary | ICD-10-CM | POA: Diagnosis not present

## 2013-10-24 DIAGNOSIS — M6281 Muscle weakness (generalized): Secondary | ICD-10-CM | POA: Diagnosis not present

## 2013-10-24 DIAGNOSIS — R262 Difficulty in walking, not elsewhere classified: Secondary | ICD-10-CM | POA: Diagnosis not present

## 2013-10-24 DIAGNOSIS — M542 Cervicalgia: Secondary | ICD-10-CM | POA: Diagnosis not present

## 2013-10-24 DIAGNOSIS — M5380 Other specified dorsopathies, site unspecified: Secondary | ICD-10-CM | POA: Diagnosis not present

## 2013-10-24 DIAGNOSIS — IMO0001 Reserved for inherently not codable concepts without codable children: Secondary | ICD-10-CM | POA: Diagnosis not present

## 2013-10-29 DIAGNOSIS — M6281 Muscle weakness (generalized): Secondary | ICD-10-CM | POA: Diagnosis not present

## 2013-10-29 DIAGNOSIS — IMO0001 Reserved for inherently not codable concepts without codable children: Secondary | ICD-10-CM | POA: Diagnosis not present

## 2013-10-29 DIAGNOSIS — M542 Cervicalgia: Secondary | ICD-10-CM | POA: Diagnosis not present

## 2013-10-29 DIAGNOSIS — M5380 Other specified dorsopathies, site unspecified: Secondary | ICD-10-CM | POA: Diagnosis not present

## 2013-10-29 DIAGNOSIS — R262 Difficulty in walking, not elsewhere classified: Secondary | ICD-10-CM | POA: Diagnosis not present

## 2013-10-31 DIAGNOSIS — IMO0001 Reserved for inherently not codable concepts without codable children: Secondary | ICD-10-CM | POA: Diagnosis not present

## 2013-10-31 DIAGNOSIS — M6281 Muscle weakness (generalized): Secondary | ICD-10-CM | POA: Diagnosis not present

## 2013-10-31 DIAGNOSIS — M542 Cervicalgia: Secondary | ICD-10-CM | POA: Diagnosis not present

## 2013-10-31 DIAGNOSIS — M5380 Other specified dorsopathies, site unspecified: Secondary | ICD-10-CM | POA: Diagnosis not present

## 2013-10-31 DIAGNOSIS — R262 Difficulty in walking, not elsewhere classified: Secondary | ICD-10-CM | POA: Diagnosis not present

## 2013-11-04 DIAGNOSIS — R262 Difficulty in walking, not elsewhere classified: Secondary | ICD-10-CM | POA: Diagnosis not present

## 2013-11-04 DIAGNOSIS — M542 Cervicalgia: Secondary | ICD-10-CM | POA: Diagnosis not present

## 2013-11-04 DIAGNOSIS — M5380 Other specified dorsopathies, site unspecified: Secondary | ICD-10-CM | POA: Diagnosis not present

## 2013-11-04 DIAGNOSIS — IMO0001 Reserved for inherently not codable concepts without codable children: Secondary | ICD-10-CM | POA: Diagnosis not present

## 2013-11-04 DIAGNOSIS — M6281 Muscle weakness (generalized): Secondary | ICD-10-CM | POA: Diagnosis not present

## 2013-11-05 DIAGNOSIS — Z7982 Long term (current) use of aspirin: Secondary | ICD-10-CM | POA: Diagnosis not present

## 2013-11-05 DIAGNOSIS — Z79899 Other long term (current) drug therapy: Secondary | ICD-10-CM | POA: Diagnosis not present

## 2013-11-05 DIAGNOSIS — K589 Irritable bowel syndrome without diarrhea: Secondary | ICD-10-CM | POA: Diagnosis present

## 2013-11-05 DIAGNOSIS — I10 Essential (primary) hypertension: Secondary | ICD-10-CM | POA: Diagnosis present

## 2013-11-05 DIAGNOSIS — H468 Other optic neuritis: Secondary | ICD-10-CM | POA: Diagnosis not present

## 2013-11-05 DIAGNOSIS — D869 Sarcoidosis, unspecified: Secondary | ICD-10-CM | POA: Diagnosis not present

## 2013-11-05 DIAGNOSIS — H9190 Unspecified hearing loss, unspecified ear: Secondary | ICD-10-CM | POA: Diagnosis present

## 2013-11-05 DIAGNOSIS — G2581 Restless legs syndrome: Secondary | ICD-10-CM | POA: Diagnosis present

## 2013-11-05 DIAGNOSIS — H5340 Unspecified visual field defects: Secondary | ICD-10-CM | POA: Diagnosis not present

## 2013-11-05 DIAGNOSIS — G988 Other disorders of nervous system: Secondary | ICD-10-CM | POA: Diagnosis not present

## 2013-11-05 DIAGNOSIS — IMO0001 Reserved for inherently not codable concepts without codable children: Secondary | ICD-10-CM | POA: Diagnosis present

## 2013-11-05 DIAGNOSIS — H539 Unspecified visual disturbance: Secondary | ICD-10-CM | POA: Diagnosis not present

## 2013-11-05 DIAGNOSIS — G4733 Obstructive sleep apnea (adult) (pediatric): Secondary | ICD-10-CM | POA: Diagnosis present

## 2013-11-05 DIAGNOSIS — C9111 Chronic lymphocytic leukemia of B-cell type in remission: Secondary | ICD-10-CM | POA: Diagnosis present

## 2013-11-05 DIAGNOSIS — C9110 Chronic lymphocytic leukemia of B-cell type not having achieved remission: Secondary | ICD-10-CM | POA: Diagnosis not present

## 2013-11-05 DIAGNOSIS — Z0389 Encounter for observation for other suspected diseases and conditions ruled out: Secondary | ICD-10-CM | POA: Diagnosis not present

## 2013-11-05 DIAGNOSIS — H469 Unspecified optic neuritis: Secondary | ICD-10-CM | POA: Diagnosis not present

## 2013-11-15 DIAGNOSIS — H469 Unspecified optic neuritis: Secondary | ICD-10-CM | POA: Diagnosis not present

## 2013-11-15 DIAGNOSIS — IMO0001 Reserved for inherently not codable concepts without codable children: Secondary | ICD-10-CM | POA: Diagnosis not present

## 2013-11-15 DIAGNOSIS — D869 Sarcoidosis, unspecified: Secondary | ICD-10-CM | POA: Diagnosis not present

## 2013-11-15 DIAGNOSIS — H30019 Focal chorioretinal inflammation, juxtapapillary, unspecified eye: Secondary | ICD-10-CM | POA: Diagnosis not present

## 2013-11-15 DIAGNOSIS — Z79899 Other long term (current) drug therapy: Secondary | ICD-10-CM | POA: Diagnosis not present

## 2013-11-15 DIAGNOSIS — IMO0002 Reserved for concepts with insufficient information to code with codable children: Secondary | ICD-10-CM | POA: Diagnosis not present

## 2013-11-20 DIAGNOSIS — H468 Other optic neuritis: Secondary | ICD-10-CM | POA: Diagnosis not present

## 2013-11-20 DIAGNOSIS — H5340 Unspecified visual field defects: Secondary | ICD-10-CM | POA: Diagnosis not present

## 2013-12-12 DIAGNOSIS — D869 Sarcoidosis, unspecified: Secondary | ICD-10-CM | POA: Diagnosis not present

## 2013-12-12 DIAGNOSIS — H468 Other optic neuritis: Secondary | ICD-10-CM | POA: Diagnosis not present

## 2013-12-18 DIAGNOSIS — Z79899 Other long term (current) drug therapy: Secondary | ICD-10-CM | POA: Diagnosis not present

## 2013-12-18 DIAGNOSIS — H469 Unspecified optic neuritis: Secondary | ICD-10-CM | POA: Diagnosis not present

## 2013-12-19 ENCOUNTER — Ambulatory Visit (INDEPENDENT_AMBULATORY_CARE_PROVIDER_SITE_OTHER): Payer: Medicare Other | Admitting: Internal Medicine

## 2013-12-19 ENCOUNTER — Encounter: Payer: Self-pay | Admitting: Internal Medicine

## 2013-12-19 VITALS — BP 120/60 | HR 92 | Temp 98.1°F | Resp 20 | Ht 60.0 in | Wt 190.0 lb

## 2013-12-19 DIAGNOSIS — D869 Sarcoidosis, unspecified: Secondary | ICD-10-CM | POA: Diagnosis not present

## 2013-12-19 DIAGNOSIS — R609 Edema, unspecified: Secondary | ICD-10-CM | POA: Diagnosis not present

## 2013-12-19 DIAGNOSIS — I10 Essential (primary) hypertension: Secondary | ICD-10-CM | POA: Diagnosis not present

## 2013-12-19 DIAGNOSIS — E139 Other specified diabetes mellitus without complications: Secondary | ICD-10-CM | POA: Diagnosis not present

## 2013-12-19 DIAGNOSIS — E099 Drug or chemical induced diabetes mellitus without complications: Secondary | ICD-10-CM

## 2013-12-19 DIAGNOSIS — R600 Localized edema: Secondary | ICD-10-CM

## 2013-12-19 DIAGNOSIS — D8689 Sarcoidosis of other sites: Secondary | ICD-10-CM

## 2013-12-19 DIAGNOSIS — T380X5A Adverse effect of glucocorticoids and synthetic analogues, initial encounter: Principal | ICD-10-CM

## 2013-12-19 MED ORDER — FUROSEMIDE 20 MG PO TABS: 20.0000 mg | ORAL_TABLET | Freq: Every day | ORAL | Status: DC

## 2013-12-19 NOTE — Progress Notes (Signed)
Pre-visit discussion using our clinic review tool. No additional management support is needed unless otherwise documented below in the visit note.  

## 2013-12-19 NOTE — Progress Notes (Signed)
Subjective:    Patient ID: Sheryl [name], female    DOB: [birth year], 68 y.o.   MRN: [id number]  HPI 68 year old patient who is seen today for followup.  She has been followed closely at [facility] for complications of neurosarcoidosis with some vision loss.  She has been on prednisone since May 1 at an initial dose of 90 mg.  This has been tapered down to 60 mg daily.  She is also on Cytoxan, as well as Bactrim antibiotic prophylaxis.  Doing fairly well.  Her vision continues to improve modestly.  Her chief complaint is worsening lower extremity edema. She has a history also of steroid associated diabetes, and she has been tracking home blood sugars carefully.  Blood sugars are never greater than 200.  Past Medical History  Diagnosis Date  . 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 DEGENERATIVE JOINT DISEASE 12/14/2006  . EXOGENOUS OBESITY 12/14/2006  . HYPERLIPIDEMIA 12/14/2006  . HYPERTENSION 12/14/2006  . Neurosarcoidosis   . FIBROMYALGIA 12/14/2006  . Sleep apnea   . Hearing loss of left ear   . Vision loss of right eye   . IBS (irritable bowel syndrome)     History   Social History  . Marital Status: Married    Spouse Name: N/A    Number of Children: N/A  . Years of Education: N/A   Occupational History  . Not on file.   Social History Main Topics  . Smoking status: Never Smoker   . Smokeless tobacco: Never Used  . Alcohol Use: No     Comment: One drink per year  . Drug Use: No  . Sexual Activity: Not on file   Other Topics Concern  . Not on file   Social History Narrative  . No narrative on file    Past Surgical History  Procedure Laterality Date  . Cholecystectomy    . Tonsillectomy    . Spinal fusion  07/25/2012    Family History  Problem Relation Age of Onset  . Diabetes Sister     No Known Allergies  Current Outpatient Prescriptions on File Prior to Visit  Medication Sig Dispense Refill  . aspirin 81 MG tablet Take 81 mg by mouth daily.        Marland Kitchen atorvastatin (LIPITOR) 40  MG tablet Take 1 tablet (40 mg total) by mouth daily.  90 tablet  3  . cetirizine (ZYRTEC) 10 MG tablet Take 10 mg by mouth daily.        . Cholecalciferol (D3-1000) 1000 UNITS capsule Take 1,000 Units by mouth daily.      . cyclobenzaprine (FLEXERIL) 10 MG tablet Take 1 tablet (10 mg total) by mouth 3 (three) times daily as needed.  60 tablet  3  . fluticasone (FLONASE) 50 MCG/ACT nasal spray Place 2 sprays into the nose daily. OTC      . lisinopril (PRINIVIL,ZESTRIL) 40 MG tablet TAKE ONE TABLET BY MOUTH ONCE DAILY  90 tablet  1  . Multiple Vitamin (MULTIVITAMIN) tablet Take 1 tablet by mouth daily.        . nabumetone (RELAFEN) 500 MG tablet Take 1 tablet (500 mg total) by mouth 2 (two) times daily.  90 tablet  6  . omeprazole (PRILOSEC) 20 MG capsule Take 1 capsule (20 mg total) by mouth daily.  90 capsule  4  . ondansetron (ZOFRAN) 8 MG tablet Take 8 mg by mouth as needed.       . traMADol (ULTRAM) 50 MG tablet Take 1 tablet (50  mg total) by mouth every 6 (six) hours as needed.  90 tablet  4   No current facility-administered medications on file prior to visit.    BP 120/60  Pulse 92  Temp(Src) 98.1 F (36.7 C) (Oral)  Resp 20  Ht 5' (1.524 m)  Wt 190 lb (86.183 kg)  BMI 37.11 kg/m2  SpO2 96%       Review of Systems  Constitutional: Negative.   HENT: Negative for congestion, dental problem, hearing loss, rhinorrhea, sinus pressure, sore throat and tinnitus.   Eyes: Negative for pain, discharge and visual disturbance.  Respiratory: Negative for cough and shortness of breath.   Cardiovascular: Positive for leg swelling. Negative for chest pain and palpitations.  Gastrointestinal: Negative for nausea, vomiting, abdominal pain, diarrhea, constipation, blood in stool and abdominal distention.  Genitourinary: Negative for dysuria, urgency, frequency, hematuria, flank pain, vaginal bleeding, vaginal discharge, difficulty urinating, vaginal pain and pelvic pain.  Musculoskeletal:  Negative for arthralgias, gait problem and joint swelling.  Skin: Negative for rash.  Neurological: Negative for dizziness, syncope, speech difficulty, weakness, numbness and headaches.  Hematological: Negative for adenopathy.  Psychiatric/Behavioral: Negative for behavioral problems, dysphoric mood and agitation. The patient is not nervous/anxious.        Objective:   Physical Exam  Constitutional: She is oriented to person, place, and time. She appears well-developed and well-nourished.  Appears slightly cushingoid, with facial edema Blood pressure 120/60 repeated  HENT:  Head: Normocephalic.  Right Ear: External ear normal.  Left Ear: External ear normal.  Mouth/Throat: Oropharynx is clear and moist.  Eyes: Conjunctivae and EOM are normal. Pupils are equal, round, and reactive to light.  Neck: Normal range of motion. Neck supple. No thyromegaly present.  Cardiovascular: Normal rate, regular rhythm, normal heart sounds and intact distal pulses.   Pulmonary/Chest: Effort normal and breath sounds normal.  Abdominal: Soft. Bowel sounds are normal. She exhibits no mass. There is no tenderness.  Musculoskeletal: Normal range of motion. She exhibits edema.  Plus 3 prominent fetal edema  Lymphadenopathy:    She has no cervical adenopathy.  Neurological: She is alert and oriented to person, place, and time.  Skin: Skin is warm and dry. No rash noted.  Psychiatric: She has a normal mood and affect. Her behavior is normal.          Assessment & Plan:   Hypertension well controlled in spite of steroid use Peripheral edema Steroid associated diabetes Dyslipidemia Neuro sarcoidosis  Restricted salt diet, as well as leg elevation discussed.  We'll place on furosemide 20 mg daily

## 2013-12-19 NOTE — Patient Instructions (Signed)
Keep legs elevated as much as possible  Limit your sodium (Salt) intake    Low-Sodium Eating Plan Sodium raises blood pressure and causes water to be held in the body. Getting less sodium from food will help lower your blood pressure, reduce any swelling, and protect your heart, liver, and kidneys. We get sodium by adding salt (sodium chloride) to food. Most of our sodium comes from canned, boxed, and frozen foods. Restaurant foods, fast foods, and pizza are also very high in sodium. Even if you take medicine to lower your blood pressure or to reduce fluid in your body, getting less sodium from your food is important. WHAT IS MY PLAN? Most people should limit their sodium intake to 2,300 mg a day. Your health care provider recommends that you limit your sodium intake to __________ a day.  WHAT DO I NEED TO KNOW ABOUT THIS EATING PLAN? For the low-sodium eating plan, you will follow these general guidelines:  Choose foods with a % Daily Value for sodium of less than 5% (as listed on the food label).   Use salt-free seasonings or herbs instead of table salt or sea salt.   Check with your health care provider or pharmacist before using salt substitutes.   Eat fresh foods.  Eat more vegetables and fruits.  Limit canned vegetables. If you do use them, rinse them well to decrease the sodium.   Limit cheese to 1 oz (28 g) per day.   Eat lower-sodium products, often labeled as "lower sodium" or "no salt added."  Avoid foods that contain monosodium glutamate (MSG). MSG is sometimes added to [place] food and some canned foods.  Check food labels (Nutrition Facts labels) on foods to learn how much sodium is in one serving.  Eat more home-cooked food and less restaurant, buffet, and fast food.  When eating at a restaurant, ask that your food be prepared with less salt or none, if possible.  HOW DO I READ FOOD LABELS FOR SODIUM INFORMATION? The Nutrition Facts label lists the amount  of sodium in one serving of the food. If you eat more than one serving, you must multiply the listed amount of sodium by the number of servings. Food labels may also identify foods as:  Sodium free--Less than 5 mg in a serving.  Very low sodium--35 mg or less in a serving.  Low sodium--140 mg or less in a serving.  Light in sodium--50% less sodium in a serving. For example, if a food that usually has 300 mg of sodium is changed to become light in sodium, it will have 150 mg of sodium.  Reduced sodium--25% less sodium in a serving. For example, if a food that usually has 400 mg of sodium is changed to reduced sodium, it will have 300 mg of sodium. WHAT FOODS CAN I EAT? Grains Low-sodium cereals, including oats, puffed wheat and rice, and shredded wheat cereals. Low-sodium crackers. Unsalted rice and pasta. Lower-sodium bread.  Vegetables Frozen or fresh vegetables. Low-sodium or reduced-sodium canned vegetables. Low-sodium or reduced-sodium tomato sauce and paste. Low-sodium or reduced-sodium tomato and vegetable juices.  Fruits Fresh, frozen, and canned fruit. Fruit juice.  Meat and Other Protein Products Low-sodium canned tuna and salmon. Fresh or frozen meat, poultry, seafood, and fish. Lamb. Unsalted nuts. Dried beans, peas, and lentils without added salt. Unsalted canned beans. Homemade soups without salt. Eggs.  Dairy Milk. Soy milk. Ricotta cheese. Low-sodium or reduced-sodium cheeses. Yogurt.  Condiments Fresh and dried herbs and spices. Salt-free seasonings.  Onion and garlic powders. Low-sodium varieties of mustard and ketchup. Lemon juice.  Fats and Oils Reduced-sodium salad dressings. Unsalted butter.  Other Unsalted popcorn and pretzels.  The items listed above may not be a complete list of recommended foods or beverages. Contact your dietitian for more options. WHAT FOODS ARE NOT RECOMMENDED? Grains Instant hot cereals. Bread stuffing, pancake, and biscuit  mixes. Croutons. Seasoned rice or pasta mixes. Noodle soup cups. Boxed or frozen macaroni and cheese. Self-rising flour. Regular salted crackers. Vegetables Regular canned vegetables. Regular canned tomato sauce and paste. Regular tomato and vegetable juices. Frozen vegetables in sauces. Salted french fries. Olives. Sheryl [name]. Relishes. Sauerkraut. Salsa. Meat and Other Protein Products Salted, canned, smoked, spiced, or pickled meats, seafood, or fish. Bacon, ham, sausage, hot dogs, corned beef, chipped beef, and packaged luncheon meats. Salt pork. Jerky. Pickled herring. Anchovies, regular canned tuna, and sardines. Salted nuts. Dairy Processed cheese and cheese spreads. Cheese curds. Blue cheese and cottage cheese. Buttermilk.  Condiments Onion and garlic salt, seasoned salt, table salt, and sea salt. Canned and packaged gravies. Worcestershire sauce. Tartar sauce. Barbecue sauce. Teriyaki sauce. Soy sauce, including reduced sodium. Steak sauce. Fish sauce. Oyster sauce. Cocktail sauce. Horseradish. Regular ketchup and mustard. Meat flavorings and tenderizers. Bouillon cubes. Hot sauce. Tabasco sauce. Marinades. Taco seasonings. Relishes. Fats and Oils Regular salad dressings. Salted butter. Margarine. Ghee. Bacon fat.  Other Potato and tortilla chips. Corn chips and puffs. Salted popcorn and pretzels. Canned or dried soups. Pizza. Frozen entrees and pot pies.  The items listed above may not be a complete list of foods and beverages to avoid. Contact your dietitian for more information. Document Released: 12/03/2001 Document Revised: 06/18/2013 Document Reviewed: 04/17/2013 [facility] Patient Information 2015 [place], [place]. This information is not intended to replace advice given to you by your health care provider. Make sure you discuss any questions you have with your health care provider.

## 2013-12-20 ENCOUNTER — Telehealth: Payer: Self-pay | Admitting: Internal Medicine

## 2013-12-20 NOTE — Telephone Encounter (Signed)
Relevant patient education assigned to patient using Emmi. ° °

## 2014-01-02 DIAGNOSIS — H469 Unspecified optic neuritis: Secondary | ICD-10-CM | POA: Diagnosis not present

## 2014-01-02 DIAGNOSIS — Z79899 Other long term (current) drug therapy: Secondary | ICD-10-CM | POA: Diagnosis not present

## 2014-01-03 DIAGNOSIS — H469 Unspecified optic neuritis: Secondary | ICD-10-CM | POA: Diagnosis not present

## 2014-01-03 DIAGNOSIS — D869 Sarcoidosis, unspecified: Secondary | ICD-10-CM | POA: Diagnosis not present

## 2014-01-07 ENCOUNTER — Telehealth: Payer: Self-pay | Admitting: Internal Medicine

## 2014-01-07 NOTE — Telephone Encounter (Signed)
Noted  

## 2014-01-07 NOTE — Telephone Encounter (Signed)
Patient Information:  Caller Name: Marcello Moores  Phone: [phone]  Patient: Sheryl [name], Sheryl [name]  Gender: Female  DOB: Jun 17, 1946  Age: 68 Years  PCP: Bluford Kaufmann [facility])  Office Follow Up:  Does the office need to follow up with this patient?: No  Instructions For The Office: N/A  RN Note:  Advised donut for sitting and Sitz baths with baking soda as tolerated. She is laying on her sides to keep pressure off of pressure sore. Advised to call Oncologist to discuss diarrhea and if she needs to cut down on medication or use lomotil to treat. Advised diluted Gatorade or Pedialyte with each large loose stool.  Symptoms  Reason For Call & Symptoms: Taking Prednisone, Bactrim and Psyclophosamide- being treated for Neurosarcodosis and is has been on chemotherpay for past month. Diarrhea started 2 weeks ago and is  on and off - every 2-3 day. Has Quarter sized bed sore (red area with white center) on buttocks- onset 4 days ago and area tender to touch. Hurts to sit. Applying Vasaline and not helping. She is keeping pressure off of it. Afebrile.  Reviewed Health History In EMR: Yes  Reviewed Medications In EMR: Yes  Reviewed Allergies In EMR: Yes  Reviewed Surgeries / Procedures: Yes  Date of Onset of Symptoms: 01/04/2014  Treatments Tried: Vasaline  Treatments Tried Worked: No  Guideline(s) Used:  Rash or Redness - Localized  Wound Infection  Disposition Per Guideline:   See Within 2 Weeks in Office  Reason For Disposition Reached:   Red, moist, irritated area between skin folds (or under larger breasts)  Advice Given:  Warm Soaks or Local Heat:  If the wound is open, soak it in warm water or put a warm wet cloth on the wound for 20 minutes 3 times per day. Use a warm saltwater solution containing 2 teaspoons of table salt per quart of water. If the wound is closed, apply a heating pad or warm, moist washcloth to the reddened area for 20 minutes 3 times per day.  Antibiotic  Ointment:  Apply an antibiotic ointment 3 times a day. If the area could become dirty, cover with a Band-Aid or a clean gauze dressing.  Pain Medicines:  For pain relief, you can take either acetaminophen, ibuprofen, or naproxen.  Expected Course:  Pain and swelling normally peak on day 2. Any redness should go away by day 3 or 4. Complete healing should occur by day 10.  Contagiousness:  For true wound infections, you can return to work or school after any fever is gone and you have received antibiotics for 24 hours.  Call Back If:   Wound becomes more tender  Redness starts to spread  Pus, drainage, or fever occurs  You become worse  Call Back If:  Rash spreads or becomes worse  Rash lasts longer than 1 week  You become worse.  Patient Refused Recommendation:  Patient Will Follow Up With Office Later  Will call back if symptoms not improving with home care advice.

## 2014-01-08 ENCOUNTER — Telehealth: Payer: Self-pay | Admitting: Internal Medicine

## 2014-01-08 NOTE — Telephone Encounter (Signed)
[facility] - [place], [place] - [address] is requesting re-fills on the following: furosemide (LASIX) 20 MG tablet predniSONE (DELTASONE) 20 MG tablet sulfamethoxazole-trimethoprim (BACTRIM DS) 800-160 MG per tablet atorvastatin (LIPITOR) 40 MG tablet lisinopril (PRINIVIL,ZESTRIL) 40 MG tablet omeprazole (PRILOSEC) 20 MG capsule

## 2014-01-09 DIAGNOSIS — D869 Sarcoidosis, unspecified: Secondary | ICD-10-CM | POA: Diagnosis not present

## 2014-01-09 DIAGNOSIS — H468 Other optic neuritis: Secondary | ICD-10-CM | POA: Diagnosis not present

## 2014-01-09 MED ORDER — LISINOPRIL 40 MG PO TABS: ORAL_TABLET | ORAL | Status: DC

## 2014-01-09 MED ORDER — PREDNISONE 20 MG PO TABS
60.0000 mg | ORAL_TABLET | Freq: Every day | ORAL | Status: DC
Start: 2014-01-09 — End: 2014-03-17

## 2014-01-09 MED ORDER — FUROSEMIDE 20 MG PO TABS: 20.0000 mg | ORAL_TABLET | Freq: Every day | ORAL | Status: DC

## 2014-01-09 MED ORDER — ATORVASTATIN CALCIUM 40 MG PO TABS: 40.0000 mg | ORAL_TABLET | Freq: Every day | ORAL | Status: DC

## 2014-01-09 MED ORDER — SULFAMETHOXAZOLE-TMP DS 800-160 MG PO TABS: 1.0000 | ORAL_TABLET | ORAL | Status: DC

## 2014-01-09 MED ORDER — OMEPRAZOLE 20 MG PO CPDR: 20.0000 mg | DELAYED_RELEASE_CAPSULE | Freq: Every day | ORAL | Status: DC

## 2014-01-09 NOTE — Telephone Encounter (Signed)
Rxs sent

## 2014-01-15 DIAGNOSIS — H469 Unspecified optic neuritis: Secondary | ICD-10-CM | POA: Diagnosis not present

## 2014-01-15 DIAGNOSIS — Z79899 Other long term (current) drug therapy: Secondary | ICD-10-CM | POA: Diagnosis not present

## 2014-01-22 DIAGNOSIS — L8992 Pressure ulcer of unspecified site, stage 2: Secondary | ICD-10-CM | POA: Diagnosis not present

## 2014-01-22 DIAGNOSIS — R5383 Other fatigue: Secondary | ICD-10-CM | POA: Diagnosis not present

## 2014-01-22 DIAGNOSIS — Z79899 Other long term (current) drug therapy: Secondary | ICD-10-CM | POA: Diagnosis not present

## 2014-01-22 DIAGNOSIS — C9590 Leukemia, unspecified not having achieved remission: Secondary | ICD-10-CM | POA: Diagnosis not present

## 2014-01-22 DIAGNOSIS — L97209 Non-pressure chronic ulcer of unspecified calf with unspecified severity: Secondary | ICD-10-CM | POA: Diagnosis not present

## 2014-01-22 DIAGNOSIS — D869 Sarcoidosis, unspecified: Secondary | ICD-10-CM | POA: Diagnosis not present

## 2014-01-22 DIAGNOSIS — R29898 Other symptoms and signs involving the musculoskeletal system: Secondary | ICD-10-CM | POA: Diagnosis not present

## 2014-01-22 DIAGNOSIS — L89309 Pressure ulcer of unspecified buttock, unspecified stage: Secondary | ICD-10-CM | POA: Diagnosis not present

## 2014-01-22 DIAGNOSIS — R5381 Other malaise: Secondary | ICD-10-CM | POA: Diagnosis not present

## 2014-01-22 DIAGNOSIS — R404 Transient alteration of awareness: Secondary | ICD-10-CM | POA: Diagnosis not present

## 2014-01-22 DIAGNOSIS — I10 Essential (primary) hypertension: Secondary | ICD-10-CM | POA: Diagnosis not present

## 2014-01-29 DIAGNOSIS — H469 Unspecified optic neuritis: Secondary | ICD-10-CM | POA: Diagnosis not present

## 2014-01-29 DIAGNOSIS — Z79899 Other long term (current) drug therapy: Secondary | ICD-10-CM | POA: Diagnosis not present

## 2014-02-06 DIAGNOSIS — H468 Other optic neuritis: Secondary | ICD-10-CM | POA: Diagnosis not present

## 2014-02-06 DIAGNOSIS — D869 Sarcoidosis, unspecified: Secondary | ICD-10-CM | POA: Diagnosis not present

## 2014-02-10 DIAGNOSIS — R197 Diarrhea, unspecified: Secondary | ICD-10-CM | POA: Diagnosis not present

## 2014-02-10 DIAGNOSIS — E876 Hypokalemia: Secondary | ICD-10-CM | POA: Diagnosis not present

## 2014-02-10 DIAGNOSIS — L8993 Pressure ulcer of unspecified site, stage 3: Secondary | ICD-10-CM | POA: Diagnosis not present

## 2014-02-10 DIAGNOSIS — I10 Essential (primary) hypertension: Secondary | ICD-10-CM | POA: Diagnosis not present

## 2014-02-10 DIAGNOSIS — C9110 Chronic lymphocytic leukemia of B-cell type not having achieved remission: Secondary | ICD-10-CM | POA: Diagnosis not present

## 2014-02-10 DIAGNOSIS — E2749 Other adrenocortical insufficiency: Secondary | ICD-10-CM | POA: Diagnosis not present

## 2014-02-10 DIAGNOSIS — E860 Dehydration: Secondary | ICD-10-CM | POA: Diagnosis not present

## 2014-02-10 DIAGNOSIS — D869 Sarcoidosis, unspecified: Secondary | ICD-10-CM | POA: Diagnosis not present

## 2014-02-10 DIAGNOSIS — D708 Other neutropenia: Secondary | ICD-10-CM | POA: Diagnosis not present

## 2014-02-10 DIAGNOSIS — K5900 Constipation, unspecified: Secondary | ICD-10-CM | POA: Diagnosis present

## 2014-02-10 DIAGNOSIS — R109 Unspecified abdominal pain: Secondary | ICD-10-CM | POA: Diagnosis not present

## 2014-02-10 DIAGNOSIS — L89109 Pressure ulcer of unspecified part of back, unspecified stage: Secondary | ICD-10-CM | POA: Diagnosis present

## 2014-02-10 DIAGNOSIS — Z79899 Other long term (current) drug therapy: Secondary | ICD-10-CM | POA: Diagnosis not present

## 2014-02-10 DIAGNOSIS — K5730 Diverticulosis of large intestine without perforation or abscess without bleeding: Secondary | ICD-10-CM | POA: Diagnosis not present

## 2014-02-10 DIAGNOSIS — D649 Anemia, unspecified: Secondary | ICD-10-CM | POA: Diagnosis present

## 2014-02-10 DIAGNOSIS — IMO0001 Reserved for inherently not codable concepts without codable children: Secondary | ICD-10-CM | POA: Diagnosis present

## 2014-02-10 DIAGNOSIS — Z7982 Long term (current) use of aspirin: Secondary | ICD-10-CM | POA: Diagnosis not present

## 2014-02-10 DIAGNOSIS — Z856 Personal history of leukemia: Secondary | ICD-10-CM | POA: Diagnosis not present

## 2014-02-10 DIAGNOSIS — K5732 Diverticulitis of large intestine without perforation or abscess without bleeding: Secondary | ICD-10-CM | POA: Diagnosis not present

## 2014-02-10 DIAGNOSIS — H9190 Unspecified hearing loss, unspecified ear: Secondary | ICD-10-CM | POA: Diagnosis present

## 2014-02-10 DIAGNOSIS — D709 Neutropenia, unspecified: Secondary | ICD-10-CM | POA: Diagnosis not present

## 2014-02-10 DIAGNOSIS — H5440 Blindness, one eye, unspecified eye: Secondary | ICD-10-CM | POA: Diagnosis present

## 2014-02-13 ENCOUNTER — Ambulatory Visit: Payer: Medicare Other | Admitting: Hematology and Oncology

## 2014-02-13 ENCOUNTER — Other Ambulatory Visit: Payer: Medicare Other

## 2014-02-16 ENCOUNTER — Emergency Department (HOSPITAL_COMMUNITY): Payer: Medicare Other

## 2014-02-16 ENCOUNTER — Encounter (HOSPITAL_COMMUNITY): Payer: Self-pay | Admitting: Emergency Medicine

## 2014-02-16 ENCOUNTER — Emergency Department (HOSPITAL_COMMUNITY)
Admission: EM | Admit: 2014-02-16 | Discharge: 2014-02-16 | Disposition: A | Discharge status: Home / Self Care | Payer: Medicare Other | Attending: Emergency Medicine | Admitting: Emergency Medicine

## 2014-02-16 DIAGNOSIS — R0602 Shortness of breath: Secondary | ICD-10-CM | POA: Diagnosis not present

## 2014-02-16 DIAGNOSIS — I10 Essential (primary) hypertension: Secondary | ICD-10-CM | POA: Diagnosis not present

## 2014-02-16 DIAGNOSIS — K589 Irritable bowel syndrome without diarrhea: Secondary | ICD-10-CM | POA: Diagnosis not present

## 2014-02-16 DIAGNOSIS — J069 Acute upper respiratory infection, unspecified: Secondary | ICD-10-CM | POA: Insufficient documentation

## 2014-02-16 DIAGNOSIS — Z7982 Long term (current) use of aspirin: Secondary | ICD-10-CM | POA: Diagnosis not present

## 2014-02-16 DIAGNOSIS — E785 Hyperlipidemia, unspecified: Secondary | ICD-10-CM | POA: Insufficient documentation

## 2014-02-16 DIAGNOSIS — Z79899 Other long term (current) drug therapy: Secondary | ICD-10-CM | POA: Diagnosis not present

## 2014-02-16 DIAGNOSIS — Z8619 Personal history of other infectious and parasitic diseases: Secondary | ICD-10-CM | POA: Insufficient documentation

## 2014-02-16 DIAGNOSIS — M1990 Unspecified osteoarthritis, unspecified site: Secondary | ICD-10-CM | POA: Insufficient documentation

## 2014-02-16 DIAGNOSIS — R0989 Other specified symptoms and signs involving the circulatory and respiratory systems: Secondary | ICD-10-CM | POA: Diagnosis not present

## 2014-02-16 DIAGNOSIS — IMO0001 Reserved for inherently not codable concepts without codable children: Secondary | ICD-10-CM | POA: Diagnosis not present

## 2014-02-16 DIAGNOSIS — Z8669 Personal history of other diseases of the nervous system and sense organs: Secondary | ICD-10-CM | POA: Diagnosis not present

## 2014-02-16 DIAGNOSIS — IMO0002 Reserved for concepts with insufficient information to code with codable children: Secondary | ICD-10-CM | POA: Insufficient documentation

## 2014-02-16 DIAGNOSIS — E669 Obesity, unspecified: Secondary | ICD-10-CM | POA: Diagnosis not present

## 2014-02-16 LAB — BASIC METABOLIC PANEL
Anion gap: 13 (ref 5–15)
BUN: 19 mg/dL (ref 6–23)
CO2: 25 mEq/L (ref 19–32)
Calcium: 9 mg/dL (ref 8.4–10.5)
Chloride: 109 mEq/L (ref 96–112)
Creatinine, Ser: 0.86 mg/dL (ref 0.50–1.10)
GFR calc Af Amer: 80 mL/min — ABNORMAL LOW (ref >90)
GFR calc non Af Amer: 69 mL/min — ABNORMAL LOW (ref >90)
Glucose, Bld: 88 mg/dL (ref 70–99)
Potassium: 3.1 mEq/L — ABNORMAL LOW (ref 3.7–5.3)
Sodium: 147 mEq/L (ref 137–147)

## 2014-02-16 LAB — CBC
HCT: 32.3 % — ABNORMAL LOW (ref 36.0–46.0)
Hemoglobin: 10.8 g/dL — ABNORMAL LOW (ref 12.0–15.0)
MCH: 33.1 pg (ref 26.0–34.0)
MCHC: 33.4 g/dL (ref 30.0–36.0)
MCV: 99.1 fL (ref 78.0–100.0)
Platelets: 142 10*3/uL — ABNORMAL LOW (ref 150–400)
RBC: 3.26 MIL/uL — ABNORMAL LOW (ref 3.87–5.11)
RDW: 24 % — ABNORMAL HIGH (ref 11.5–15.5)
WBC: 2.2 10*3/uL — ABNORMAL LOW (ref 4.0–10.5)

## 2014-02-16 LAB — I-STAT TROPONIN, ED: Troponin i, poc: 0 ng/mL (ref 0.00–0.08)

## 2014-02-16 LAB — PRO B NATRIURETIC PEPTIDE: Pro B Natriuretic peptide (BNP): 587.7 pg/mL — ABNORMAL HIGH (ref 0–125)

## 2014-02-16 MED ORDER — POTASSIUM CHLORIDE CRYS ER 20 MEQ PO TBCR
40.0000 meq | EXTENDED_RELEASE_TABLET | Freq: Once | ORAL | Status: AC
  Administered 2014-02-16: 40 meq via ORAL
  Filled 2014-02-16: qty 2

## 2014-02-16 MED ORDER — OXYMETAZOLINE HCL 0.05 % NA SOLN
2.0000 | Freq: Once | NASAL | Status: AC
  Administered 2014-02-16: 2 via NASAL
  Filled 2014-02-16: qty 15

## 2014-02-16 NOTE — ED Notes (Addendum)
Was in [facility] from last week until yesterday with diverticulitis. Has not taken today's medication.   Has used Flonase and saline solution this am without relief for nasal congestion

## 2014-02-16 NOTE — Discharge Instructions (Signed)
Upper Respiratory Infection, Adult An upper respiratory infection (URI) is also sometimes known as the common cold. The upper respiratory tract includes the nose, sinuses, throat, trachea, and bronchi. Bronchi are the airways leading to the lungs. Most people improve within 1 week, but symptoms can last up to 2 weeks. A residual cough may last even longer.  CAUSES Many different viruses can infect the tissues lining the upper respiratory tract. The tissues become irritated and inflamed and often become very moist. Mucus production is also common. A cold is contagious. You can easily spread the virus to others by oral contact. This includes kissing, sharing a glass, coughing, or sneezing. Touching your mouth or nose and then touching a surface, which is then touched by another person, can also spread the virus. SYMPTOMS  Symptoms typically develop 1 to 3 days after you come in contact with a cold virus. Symptoms vary from person to person. They may include:  Runny nose.  Sneezing.  Nasal congestion.  Sinus irritation.  Sore throat.  Loss of voice (laryngitis).  Cough.  Fatigue.  Muscle aches.  Loss of appetite.  Headache.  Low-grade fever. DIAGNOSIS  You might diagnose your own cold based on familiar symptoms, since most people get a cold 2 to 3 times a year. Your caregiver can confirm this based on your exam. Most importantly, your caregiver can check that your symptoms are not due to another disease such as strep throat, sinusitis, pneumonia, asthma, or epiglottitis. Blood tests, throat tests, and X-rays are not necessary to diagnose a common cold, but they may sometimes be helpful in excluding other more serious diseases. Your caregiver will decide if any further tests are required. RISKS AND COMPLICATIONS  You may be at risk for a more severe case of the common cold if you smoke cigarettes, have chronic heart disease (such as heart failure) or lung disease (such as asthma), or if  you have a weakened immune system. The very young and very old are also at risk for more serious infections. Bacterial sinusitis, middle ear infections, and bacterial pneumonia can complicate the common cold. The common cold can worsen asthma and chronic obstructive pulmonary disease (COPD). Sometimes, these complications can require emergency medical care and may be life-threatening. PREVENTION  The best way to protect against getting a cold is to practice good hygiene. Avoid oral or hand contact with people with cold symptoms. Wash your hands often if contact occurs. There is no clear evidence that vitamin C, vitamin E, echinacea, or exercise reduces the chance of developing a cold. However, it is always recommended to get plenty of rest and practice good nutrition. TREATMENT  Treatment is directed at relieving symptoms. There is no cure. Antibiotics are not effective, because the infection is caused by a virus, not by bacteria. Treatment may include:  Increased fluid intake. Sports drinks offer valuable electrolytes, sugars, and fluids.  Breathing heated mist or steam (vaporizer or shower).  Eating chicken soup or other clear broths, and maintaining good nutrition.  Getting plenty of rest.  Using gargles or lozenges for comfort.  Controlling fevers with ibuprofen or acetaminophen as directed by your caregiver.  Increasing usage of your inhaler if you have asthma. Zinc gel and zinc lozenges, taken in the first 24 hours of the common cold, can shorten the duration and lessen the severity of symptoms. Pain medicines may help with fever, muscle aches, and throat pain. A variety of non-prescription medicines are available to treat congestion and runny nose. Your caregiver  can make recommendations and may suggest nasal or lung inhalers for other symptoms.  HOME CARE INSTRUCTIONS   Only take over-the-counter or prescription medicines for pain, discomfort, or fever as directed by your  caregiver.  Use a warm mist humidifier or inhale steam from a shower to increase air moisture. This may keep secretions moist and make it easier to breathe.  Drink enough water and fluids to keep your urine clear or pale yellow.  Rest as needed.  Return to work when your temperature has returned to normal or as your caregiver advises. You may need to stay home longer to avoid infecting others. You can also use a face mask and careful hand washing to prevent spread of the virus. SEEK MEDICAL CARE IF:   After the first few days, you feel you are getting worse rather than better.  You need your caregiver's advice about medicines to control symptoms.  You develop chills, worsening shortness of breath, or brown or red sputum. These may be signs of pneumonia.  You develop yellow or brown nasal discharge or pain in the face, especially when you bend forward. These may be signs of sinusitis.  You develop a fever, swollen neck glands, pain with swallowing, or white areas in the back of your throat. These may be signs of strep throat. SEEK IMMEDIATE MEDICAL CARE IF:   You have a fever.  You develop severe or persistent headache, ear pain, sinus pain, or chest pain.  You develop wheezing, a prolonged cough, cough up blood, or have a change in your usual mucus (if you have chronic lung disease).  You develop sore muscles or a stiff neck. Document Released: 12/07/2000 Document Revised: 09/05/2011 Document Reviewed: 09/18/2013 [facility] Patient Information 2015 [place], [place]. This information is not intended to replace advice given to you by your health care provider. Make sure you discuss any questions you have with your health care provider.   Oxymetazoline nasal spray What is this medicine? Oxymetazoline (OX ee me TAZ oh leen) is a nasal decongestant. This medicine is used to treat nasal congestion or a stuffy nose. This medicine will not treat an infection. This medicine may be used for  other purposes; ask your health care provider or pharmacist if you have questions. COMMON BRAND NAME(S): 12 Hour Nasal, Afrin, Afrin Extra Moisturizing, Afrin Nasal Sinus, Dristan, Duration, Genasal, Mucinex Full Force, Mucinex Moisture Smart, Mucinex Sinus-Max, Nasal Relief, Neo-Synephrine 12-Hour, Neo-Synephrine Severe Sinus Congestion, Sinex 12-Hour, Sudafed OM Sinus Cold Moisturizing, Sudafed OM Sinus Congestion Moisturizing, Vicks Qlearquil Decongestant, Vicks Sinex, Zicam Extreme Congestion Relief, Zicam Intense Sinus What should I tell my health care provider before I take this medicine? They need to know if you have any of these conditions: -diabetes -heart disease -high blood pressure -thyroid disease -trouble urinating due to an enlarged prostate gland -an unusual or allergic reaction to oxymetazoline, other medicines, foods, dyes, or preservatives -pregnant or trying to get pregnant -breast-feeding How should I use this medicine? This medicine is for use in the nose. Do not take by mouth. Follow the directions on the package label. Shake well before using. Use your medicine at regular intervals or as directed by your health care provider. Do not use it more often than directed. Do not use for more than 3 days in a row without advice. Make sure that you are using your nasal spray correctly. Ask your doctor or health care provider if you have any questions. Talk to your pediatrician regarding the use of this medicine  in children. While this drug may be prescribed for children for selected conditions, precautions do apply. Overdosage: If you think you've taken too much of this medicine contact a poison control center or emergency room at once. Overdosage: If you think you have taken too much of this medicine contact a poison control center or emergency room at once. NOTE: This medicine is only for you. Do not share this medicine with others. What if I miss a dose? If you miss a dose, use  it as soon as you can. If it is almost time for your next dose, use only that dose. Do not use double or extra doses. What may interact with this medicine? Do not take this medicine with any of the following medications: -MAOIs like Marplan, Nardil, and Parnate This list may not describe all possible interactions. Give your health care provider a list of all the medicines, herbs, non-prescription drugs, or dietary supplements you use. Also tell them if you smoke, drink alcohol, or use illegal drugs. Some items may interact with your medicine. What should I watch for while using this medicine? Tell your doctor or healthcare professional if your symptoms do not start to get better or if they get worse. Do not share this bottle with anyone else as this may spread germs. What side effects may I notice from receiving this medicine? Side effects that you should report to your doctor or health care professional as soon as possible: -allergic reactions like skin rash, itching or hives, swelling of the face, lips, or tongue Side effects that usually do not require medical attention (Report these to your doctor or health care professional if they continue or are bothersome.): -burning, stinging, or irritation in the nose right after use -increased nasal discharge -sneezing This list may not describe all possible side effects. Call your doctor for medical advice about side effects. You may report side effects to FDA at 1-800-FDA-1088. Where should I keep my medicine? Keep out of the reach of children. Store at room temperature between 20 and 25 degrees C (68 and 77 degrees F). Throw away any unused medicine after the expiration date. NOTE: This sheet is a summary. It may not cover all possible information. If you have questions about this medicine, talk to your doctor, pharmacist, or health care provider.  2015, [employer]. (2011-01-12 14:11:31)

## 2014-02-16 NOTE — ED Notes (Signed)
Onset today when patient woke up approximately 0700 sinus congestion and anxious because patient could not breath and became short of breath. Recently having a non productive cough.

## 2014-02-16 NOTE — ED Provider Notes (Signed)
TIME SEEN: 9:54 AM  CHIEF COMPLAINT: Nasal congestion, shortness of breath  HPI: Patient is a 68 year old female with history of hypertension, hyperlipidemia, fibromyalgia who presents to the emergency department with shortness of breath. She states that she woke up this morning and had nasal congestion and felt she could not breathe through her nose. She states she was able to breathe through her mouth without difficulty. She states this made her very anxious and she called 911. She took Flonase prior to arrival states she is ready feeling better. She has had a nonproductive cough recently. No fevers or chills. No chest pain. No lower extremity swelling or pain. No prior history of CAD, PE or DVT. She does not wear oxygen at home.  ROS: See HPI Constitutional: no fever  Eyes: no drainage  ENT: no runny nose   Cardiovascular:  no chest pain  Resp: no SOB  GI: no vomiting GU: no dysuria Integumentary: no rash  Allergy: no hives  Musculoskeletal: no leg swelling  Neurological: no slurred speech ROS otherwise negative  PAST MEDICAL HISTORY/PAST SURGICAL HISTORY:  Past Medical History  Diagnosis Date  . 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 DEGENERATIVE JOINT DISEASE 12/14/2006  . EXOGENOUS OBESITY 12/14/2006  . HYPERLIPIDEMIA 12/14/2006  . HYPERTENSION 12/14/2006  . Neurosarcoidosis   . FIBROMYALGIA 12/14/2006  . Sleep apnea   . Hearing loss of left ear   . Vision loss of right eye   . IBS (irritable bowel syndrome)     MEDICATIONS:  Prior to Admission medications   Medication Sig Start Date End Date Taking? Authorizing Provider  aspirin 81 MG tablet Take 81 mg by mouth daily.      Historical Provider, MD  atorvastatin (LIPITOR) 40 MG tablet Take 1 tablet (40 mg total) by mouth daily. 01/09/14   Marletta Lor, MD  cetirizine (ZYRTEC) 10 MG tablet Take 10 mg by mouth daily.      Historical Provider, MD  Cholecalciferol (D3-1000) 1000 UNITS capsule Take 1,000 Units by mouth daily.    Historical  Provider, MD  cyclobenzaprine (FLEXERIL) 10 MG tablet Take 1 tablet (10 mg total) by mouth 3 (three) times daily as needed. 08/18/11   Marletta Lor, MD  cyclophosphamide (CYTOXAN) 50 MG tablet Take 200 mg by mouth daily. Give on an empty stomach 1 hour before or 2 hours after meals.    Historical Provider, MD  fluticasone (FLONASE) 50 MCG/ACT nasal spray Place 2 sprays into the nose daily. OTC 10/16/12   Marletta Lor, MD  furosemide (LASIX) 20 MG tablet Take 1 tablet (20 mg total) by mouth daily. 01/09/14   Marletta Lor, MD  lisinopril (PRINIVIL,ZESTRIL) 40 MG tablet TAKE ONE TABLET BY MOUTH ONCE DAILY 01/09/14   Marletta Lor, MD  Multiple Vitamin (MULTIVITAMIN) tablet Take 1 tablet by mouth daily.      Historical Provider, MD  nabumetone (RELAFEN) 500 MG tablet Take 1 tablet (500 mg total) by mouth 2 (two) times daily. 08/18/11   Marletta Lor, MD  omeprazole (PRILOSEC) 20 MG capsule Take 1 capsule (20 mg total) by mouth daily. 01/09/14   Marletta Lor, MD  ondansetron (ZOFRAN) 8 MG tablet Take 8 mg by mouth as needed.  06/27/13   Historical Provider, MD  predniSONE (DELTASONE) 20 MG tablet Take 3 tablets (60 mg total) by mouth daily with breakfast. 01/09/14   Marletta Lor, MD  sulfamethoxazole-trimethoprim (BACTRIM DS) 800-160 MG per tablet Take 1 tablet by mouth 3 (three) times  a week. 01/09/14   Marletta Lor, MD  traMADol (ULTRAM) 50 MG tablet Take 1 tablet (50 mg total) by mouth every 6 (six) hours as needed. 08/18/11   Marletta Lor, MD    ALLERGIES:  No Known Allergies  SOCIAL HISTORY:  History  Substance Use Topics  . Smoking status: Never Smoker   . Smokeless tobacco: Never Used  . Alcohol Use: No     Comment: One drink per year    FAMILY HISTORY: Family History  Problem Relation Age of Onset  . Diabetes Sister     EXAM: BP 161/57  Pulse 84  Temp(Src) 97.6 F (36.4 C) (Oral)  Resp 18  Ht 5' (1.524 m)  Wt 190 lb (86.183  kg)  BMI 37.11 kg/m2  SpO2 97% CONSTITUTIONAL: Alert and oriented and responds appropriately to questions. Well-appearing; well-nourished obese HEAD: Normocephalic EYES: Conjunctivae clear, PERRL ENT: normal nose; no rhinorrhea; moist mucous membranes; pharynx without lesions noted, clear nasal congestion NECK: Supple, no meningismus, no LAD  CARD: RRR; S1 and S2 appreciated; no murmurs, no clicks, no rubs, no gallops RESP: Normal chest excursion without splinting or tachypnea; breath sounds clear and equal bilaterally; no wheezes, no rhonchi, no rales,  ABD/GI: Normal bowel sounds; non-distended; soft, non-tender, no rebound, no guarding BACK:  The back appears normal and is non-tender to palpation, there is no CVA tenderness EXT: Normal ROM in all joints; non-tender to palpation; no edema; normal capillary refill; no cyanosis    SKIN: Normal color for age and race; warm NEURO: Moves all extremities equally PSYCH: The patient's mood and manner are appropriate. Grooming and personal hygiene are appropriate.  MEDICAL DECISION MAKING: Patient here with shortness of breath secondary to her nasal congestion. She states that she is able to breathe out of her mouth fine and does not feel short of breath when breathing out of her mouth. Her lungs are clear to auscultation. She is hemodynamically stable. No respiratory distress, hypoxia or increased work of breathing. She does state however she was recently admitted to the hospital at [facility] and discharged several days ago for diverticulitis. We'll check basic labs and chest x-ray to ensure there is no other cause for her feeling short of breath today although I feel this is mostly due to her nasal congestion. We'll give aspirin for symptom relief but if workup is negative I feel she'll be discharged home.  ED PROGRESS: Pt reports feeling better after Afrin.  No longer has nasal congestion.  Labs unremarkable other than mild  pancytopenia and mild hypokalemia.  Will replace with oral potassium. Have discussed with her supportive care instructions including using nasal saline and Afrin over-the-counter. I feel she is safe to be discharged home. Discussed return precautions. She verbalized understanding and is comfortable with this plan.   EKG Interpretation  Date/Time:  Sunday February 16 2014 09:25:18 EDT Ventricular Rate:  85 PR Interval:  145 QRS Duration: 79 QT Interval:  475 QTC Calculation: 565 R Axis:   20 Text Interpretation:  Sinus rhythm Anterior infarct, old Prolonged QT interval No old tracing to compare Confirmed by Andromeda Poppen,  DO, Henya Aguallo [phone]) on 02/16/2014 9:42:20 AM         [place], DO 02/16/14 1159

## 2014-02-21 ENCOUNTER — Encounter: Payer: Self-pay | Admitting: Internal Medicine

## 2014-02-21 ENCOUNTER — Ambulatory Visit (INDEPENDENT_AMBULATORY_CARE_PROVIDER_SITE_OTHER): Payer: Medicare Other | Admitting: Internal Medicine

## 2014-02-21 VITALS — BP 142/70 | HR 87 | Temp 98.2°F | Resp 20

## 2014-02-21 DIAGNOSIS — E139 Other specified diabetes mellitus without complications: Secondary | ICD-10-CM

## 2014-02-21 DIAGNOSIS — D869 Sarcoidosis, unspecified: Secondary | ICD-10-CM

## 2014-02-21 DIAGNOSIS — T380X5A Adverse effect of glucocorticoids and synthetic analogues, initial encounter: Principal | ICD-10-CM

## 2014-02-21 DIAGNOSIS — C9110 Chronic lymphocytic leukemia of B-cell type not having achieved remission: Secondary | ICD-10-CM | POA: Diagnosis not present

## 2014-02-21 DIAGNOSIS — I10 Essential (primary) hypertension: Secondary | ICD-10-CM

## 2014-02-21 DIAGNOSIS — K5732 Diverticulitis of large intestine without perforation or abscess without bleeding: Secondary | ICD-10-CM

## 2014-02-21 DIAGNOSIS — E099 Drug or chemical induced diabetes mellitus without complications: Secondary | ICD-10-CM

## 2014-02-21 DIAGNOSIS — D8689 Sarcoidosis of other sites: Secondary | ICD-10-CM

## 2014-02-21 LAB — BASIC METABOLIC PANEL
BUN: 11 mg/dL (ref 6–23)
CO2: 29 mEq/L (ref 19–32)
Calcium: 8.9 mg/dL (ref 8.4–10.5)
Chloride: 107 mEq/L (ref 96–112)
Creatinine, Ser: 0.9 mg/dL (ref 0.4–1.2)
GFR: 65.71 mL/min (ref >60.00)
Glucose, Bld: 106 mg/dL — ABNORMAL HIGH (ref 70–99)
Potassium: 3.2 mEq/L — ABNORMAL LOW (ref 3.5–5.1)
Sodium: 147 mEq/L — ABNORMAL HIGH (ref 135–145)

## 2014-02-21 LAB — CBC WITH DIFFERENTIAL/PLATELET
Basophils Absolute: 0 10*3/uL (ref 0.0–0.1)
Basophils Relative: 0.1 % (ref 0.0–3.0)
Eosinophils Absolute: 0 10*3/uL (ref 0.0–0.7)
Eosinophils Relative: 0.3 % (ref 0.0–5.0)
HCT: 30.5 % — ABNORMAL LOW (ref 36.0–46.0)
Hemoglobin: 10.3 g/dL — ABNORMAL LOW (ref 12.0–15.0)
Lymphocytes Relative: 2.8 % — ABNORMAL LOW (ref 12.0–46.0)
Lymphs Abs: 0.2 10*3/uL — ABNORMAL LOW (ref 0.7–4.0)
MCHC: 33.9 g/dL (ref 30.0–36.0)
MCV: 97.9 fl (ref 78.0–100.0)
Monocytes Absolute: 0.3 10*3/uL (ref 0.1–1.0)
Monocytes Relative: 5.5 % (ref 3.0–12.0)
Neutro Abs: 5.3 10*3/uL (ref 1.4–7.7)
Neutrophils Relative %: 91.3 % — ABNORMAL HIGH (ref 43.0–77.0)
Platelets: 162 10*3/uL (ref 150.0–400.0)
RBC: 3.11 Mil/uL — ABNORMAL LOW (ref 3.87–5.11)
RDW: 25.5 % — ABNORMAL HIGH (ref 11.5–15.5)
WBC: 5.8 10*3/uL (ref 4.0–10.5)

## 2014-02-21 NOTE — Patient Instructions (Signed)
Limit your sodium (Salt) intake  Low-Sodium Eating Plan Sodium raises blood pressure and causes water to be held in the body. Getting less sodium from food will help lower your blood pressure, reduce any swelling, and protect your heart, liver, and kidneys. We get sodium by adding salt (sodium chloride) to food. Most of our sodium comes from canned, boxed, and frozen foods. Restaurant foods, fast foods, and pizza are also very high in sodium. Even if you take medicine to lower your blood pressure or to reduce fluid in your body, getting less sodium from your food is important. WHAT IS MY PLAN? Most people should limit their sodium intake to 2,300 mg a day. Your health care provider recommends that you limit your sodium intake to __________ a day.  WHAT DO I NEED TO KNOW ABOUT THIS EATING PLAN? For the low-sodium eating plan, you will follow these general guidelines:  Choose foods with a % Daily Value for sodium of less than 5% (as listed on the food label).   Use salt-free seasonings or herbs instead of table salt or sea salt.   Check with your health care provider or pharmacist before using salt substitutes.   Eat fresh foods.  Eat more vegetables and fruits.  Limit canned vegetables. If you do use them, rinse them well to decrease the sodium.   Limit cheese to 1 oz (28 g) per day.   Eat lower-sodium products, often labeled as "lower sodium" or "no salt added."  Avoid foods that contain monosodium glutamate (MSG). MSG is sometimes added to [place] food and some canned foods.  Check food labels (Nutrition Facts labels) on foods to learn how much sodium is in one serving.  Eat more home-cooked food and less restaurant, buffet, and fast food.  When eating at a restaurant, ask that your food be prepared with less salt or none, if possible.  HOW DO I READ FOOD LABELS FOR SODIUM INFORMATION? The Nutrition Facts label lists the amount of sodium in one serving of the food. If you  eat more than one serving, you must multiply the listed amount of sodium by the number of servings. Food labels may also identify foods as:  Sodium free--Less than 5 mg in a serving.  Very low sodium--35 mg or less in a serving.  Low sodium--140 mg or less in a serving.  Light in sodium--50% less sodium in a serving. For example, if a food that usually has 300 mg of sodium is changed to become light in sodium, it will have 150 mg of sodium.  Reduced sodium--25% less sodium in a serving. For example, if a food that usually has 400 mg of sodium is changed to reduced sodium, it will have 300 mg of sodium. WHAT FOODS CAN I EAT? Grains Low-sodium cereals, including oats, puffed wheat and rice, and shredded wheat cereals. Low-sodium crackers. Unsalted rice and pasta. Lower-sodium bread.  Vegetables Frozen or fresh vegetables. Low-sodium or reduced-sodium canned vegetables. Low-sodium or reduced-sodium tomato sauce and paste. Low-sodium or reduced-sodium tomato and vegetable juices.  Fruits Fresh, frozen, and canned fruit. Fruit juice.  Meat and Other Protein Products Low-sodium canned tuna and salmon. Fresh or frozen meat, poultry, seafood, and fish. Lamb. Unsalted nuts. Dried beans, peas, and lentils without added salt. Unsalted canned beans. Homemade soups without salt. Eggs.  Dairy Milk. Soy milk. Ricotta cheese. Low-sodium or reduced-sodium cheeses. Yogurt.  Condiments Fresh and dried herbs and spices. Salt-free seasonings. Onion and garlic powders. Low-sodium varieties of mustard and ketchup.  Lemon juice.  Fats and Oils Reduced-sodium salad dressings. Unsalted butter.  Other Unsalted popcorn and pretzels.  The items listed above may not be a complete list of recommended foods or beverages. Contact your dietitian for more options. WHAT FOODS ARE NOT RECOMMENDED? Grains Instant hot cereals. Bread stuffing, pancake, and biscuit mixes. Croutons. Seasoned rice or pasta mixes.  Noodle soup cups. Boxed or frozen macaroni and cheese. Self-rising flour. Regular salted crackers. Vegetables Regular canned vegetables. Regular canned tomato sauce and paste. Regular tomato and vegetable juices. Frozen vegetables in sauces. Salted french fries. Olives. Sheryl [name]. Relishes. Sauerkraut. Salsa. Meat and Other Protein Products Salted, canned, smoked, spiced, or pickled meats, seafood, or fish. Bacon, ham, sausage, hot dogs, corned beef, chipped beef, and packaged luncheon meats. Salt pork. Jerky. Pickled herring. Anchovies, regular canned tuna, and sardines. Salted nuts. Dairy Processed cheese and cheese spreads. Cheese curds. Blue cheese and cottage cheese. Buttermilk.  Condiments Onion and garlic salt, seasoned salt, table salt, and sea salt. Canned and packaged gravies. Worcestershire sauce. Tartar sauce. Barbecue sauce. Teriyaki sauce. Soy sauce, including reduced sodium. Steak sauce. Fish sauce. Oyster sauce. Cocktail sauce. Horseradish. Regular ketchup and mustard. Meat flavorings and tenderizers. Bouillon cubes. Hot sauce. Tabasco sauce. Marinades. Taco seasonings. Relishes. Fats and Oils Regular salad dressings. Salted butter. Margarine. Ghee. Bacon fat.  Other Potato and tortilla chips. Corn chips and puffs. Salted popcorn and pretzels. Canned or dried soups. Pizza. Frozen entrees and pot pies.  The items listed above may not be a complete list of foods and beverages to avoid. Contact your dietitian for more information. Document Released: 12/03/2001 Document Revised: 06/18/2013 Document Reviewed: 04/17/2013 [facility] Patient Information 2015 [place], [place]. This information is not intended to replace advice given to you by your health care provider. Make sure you discuss any questions you have with your health care provider. Diverticulitis Diverticulitis is when small pockets that have formed in your colon (large intestine) become infected or swollen. HOME  CARE  Follow your doctor's instructions.  Follow a special diet if told by your doctor.  When you feel better, your doctor may tell you to change your diet. You may be told to eat a lot of fiber. Fruits and vegetables are good sources of fiber. Fiber makes it easier to poop (have bowel movements).  Take supplements or probiotics as told by your doctor.  Only take medicines as told by your doctor.  Keep all follow-up visits with your doctor. GET HELP IF:  Your pain does not get better.  You have a hard time eating food.  You are not pooping like normal. GET HELP RIGHT AWAY IF:  Your pain gets worse.  Your problems do not get better.  Your problems suddenly get worse.  You have a fever.  You keep throwing up (vomiting).  You have bloody or black, tarry poop (stool). MAKE SURE YOU:   Understand these instructions.  Will watch your condition.  Will get help right away if you are not doing well or get worse. Document Released: 11/30/2007 Document Revised: 06/18/2013 Document Reviewed: 05/08/2013 [facility] Patient Information 2015 [place], [place]. This information is not intended to replace advice given to you by your health care provider. Make sure you discuss any questions you have with your health care provider.

## 2014-02-21 NOTE — Progress Notes (Signed)
Subjective:    Patient ID: Sheryl [name], female    DOB: 1946/03/26, 68 y.o.   MRN: [id number]  HPI  68 year old patient who was seen today in followup.  She was hospitalized recently at [facility] for acute diverticulitis.  She was seen in the ED, the following day on August 23.  CBC revealed a white count that was severely depressed.  She also had mild hypokalemia and presently is on a potassium supplement.  She has treated hypertension and dyslipidemia. 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  She has tolerated this medication poorly with anorexia and diarrhea.  They have a call placed into [facility] for further guidance.  The patient has been off this medication for a few days  She generally feels well today.  Since discontinuing cyclophosphamide.  She's had no further diarrhea and her appetite has returned.  She denies any abdominal pain or fever; her only present complaint is weakness  Past Medical History  Diagnosis Date  . 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 DEGENERATIVE JOINT DISEASE 12/14/2006  . EXOGENOUS OBESITY 12/14/2006  . HYPERLIPIDEMIA 12/14/2006  . HYPERTENSION 12/14/2006  . Neurosarcoidosis   . FIBROMYALGIA 12/14/2006  . Sleep apnea   . Hearing loss of left ear   . Vision loss of right eye   . IBS (irritable bowel syndrome)     History   Social History  . Marital Status: Married    Spouse Name: N/A    Number of Children: N/A  . Years of Education: N/A   Occupational History  . Not on file.   Social History Main Topics  . Smoking status: Never Smoker   . Smokeless tobacco: Never Used  . Alcohol Use: No     Comment: One drink per year  . Drug Use: No  . Sexual Activity: Not on file   Other Topics Concern  . Not on file   Social History Narrative  . No narrative on file    Past Surgical History  Procedure Laterality Date  . Cholecystectomy    . Tonsillectomy      . Spinal fusion  07/25/2012    Family History  Problem Relation Age of Onset  . Diabetes Sister     Allergies  Allergen Reactions  . Adhesive [Tape] Rash    Current Outpatient Prescriptions on File Prior to Visit  Medication Sig Dispense Refill  . aspirin 81 MG tablet Take 81 mg by mouth daily.        Marland Kitchen atorvastatin (LIPITOR) 40 MG tablet Take 1 tablet (40 mg total) by mouth daily.  90 tablet  3  . cetirizine (ZYRTEC) 10 MG tablet Take 10 mg by mouth daily.        . Cholecalciferol (D3-1000) 1000 UNITS capsule Take 1,000 Units by mouth daily.      . cyclobenzaprine (FLEXERIL) 10 MG tablet Take 1 tablet (10 mg total) by mouth 3 (three) times daily as needed.  60 tablet  3  . fluticasone (FLONASE) 50 MCG/ACT nasal spray Place 2 sprays into the nose daily. OTC      . furosemide (LASIX) 20 MG tablet Take 1 tablet (20 mg total) by mouth daily.  30 tablet  3  . lisinopril (PRINIVIL,ZESTRIL) 40 MG tablet TAKE ONE TABLET BY MOUTH ONCE DAILY  90 tablet  3  . Multiple Vitamin (MULTIVITAMIN) tablet Take 1 tablet by mouth daily.        Marland Kitchen  nabumetone (RELAFEN) 500 MG tablet Take 1 tablet (500 mg total) by mouth 2 (two) times daily.  90 tablet  6  . omeprazole (PRILOSEC) 20 MG capsule Take 1 capsule (20 mg total) by mouth daily.  90 capsule  3  . ondansetron (ZOFRAN) 8 MG tablet Take 8 mg by mouth as needed.       . predniSONE (DELTASONE) 20 MG tablet Take 3 tablets (60 mg total) by mouth daily with breakfast.  270 tablet  3  . sulfamethoxazole-trimethoprim (BACTRIM DS) 800-160 MG per tablet Take 1 tablet by mouth 3 (three) times a week.  45 tablet  3  . traMADol (ULTRAM) 50 MG tablet Take 1 tablet (50 mg total) by mouth every 6 (six) hours as needed.  90 tablet  4   No current facility-administered medications on file prior to visit.    BP 142/70  Pulse 87  Temp(Src) 98.2 F (36.8 C) (Oral)  Resp 20  SpO2 96%    Review of Systems  Constitutional: Positive for activity change, appetite  change and fatigue.  HENT: Negative for congestion, dental problem, hearing loss, rhinorrhea, sinus pressure, sore throat and tinnitus.   Eyes: Negative for pain, discharge and visual disturbance.  Respiratory: Negative for cough and shortness of breath.   Cardiovascular: Negative for chest pain, palpitations and leg swelling.  Gastrointestinal: Positive for diarrhea. Negative for nausea, vomiting, abdominal pain, constipation, blood in stool and abdominal distention.  Genitourinary: Negative for dysuria, urgency, frequency, hematuria, flank pain, vaginal bleeding, vaginal discharge, difficulty urinating, vaginal pain and pelvic pain.  Musculoskeletal: Negative for arthralgias, gait problem and joint swelling.  Skin: Negative for rash.  Neurological: Positive for weakness. Negative for dizziness, syncope, speech difficulty, numbness and headaches.  Hematological: Negative for adenopathy.  Psychiatric/Behavioral: Negative for behavioral problems, dysphoric mood and agitation. The patient is not nervous/anxious.        Objective:   Physical Exam  Constitutional: She is oriented to person, place, and time. She appears well-developed and well-nourished.  Wheelchair-bound Afebrile Weak but alert in no distress  HENT:  Head: Normocephalic.  Right Ear: External ear normal.  Left Ear: External ear normal.  Mouth/Throat: Oropharynx is clear and moist.  Eyes: Conjunctivae and EOM are normal. Pupils are equal, round, and reactive to light.  Neck: Normal range of motion. Neck supple. No thyromegaly present.  Cardiovascular: Normal rate, regular rhythm, normal heart sounds and intact distal pulses.   Pulmonary/Chest: Effort normal and breath sounds normal.  Abdominal: Soft. Bowel sounds are normal. She exhibits no distension and no mass. There is no tenderness. There is no rebound and no guarding.  Musculoskeletal: Normal range of motion. She exhibits edema.  Lymphadenopathy:    She has no  cervical adenopathy.  Neurological: She is alert and oriented to person, place, and time.  Skin: Skin is warm and dry. No rash noted.  Psychiatric: She has a normal mood and affect. Her behavior is normal.          Assessment & Plan:   Resolving acute diverticulitis.  The patient will complete antibiotic therapy Marked leukopenia secondary to cyclophosphamide.  Presently on hold.  The patient will follow up with [facility] Neuro sarcoidosis History of CLL Hypertension stable Peripheral edema.  The patient will consider short-term use of furosemide 20, which has been helpful in the past  We'll check a CBC and electrolytes Follow up  [facility]  Return here in 3 months or as needed

## 2014-02-21 NOTE — Progress Notes (Signed)
Pre visit review using our clinic review tool, if applicable. No additional management support is needed unless otherwise documented below in the visit note. 

## 2014-02-23 DIAGNOSIS — H547 Unspecified visual loss: Secondary | ICD-10-CM | POA: Diagnosis not present

## 2014-02-23 DIAGNOSIS — M7989 Other specified soft tissue disorders: Secondary | ICD-10-CM | POA: Diagnosis not present

## 2014-02-23 DIAGNOSIS — C9110 Chronic lymphocytic leukemia of B-cell type not having achieved remission: Secondary | ICD-10-CM | POA: Diagnosis not present

## 2014-02-23 DIAGNOSIS — H469 Unspecified optic neuritis: Secondary | ICD-10-CM | POA: Diagnosis not present

## 2014-02-23 DIAGNOSIS — R609 Edema, unspecified: Secondary | ICD-10-CM | POA: Diagnosis not present

## 2014-02-23 DIAGNOSIS — I10 Essential (primary) hypertension: Secondary | ICD-10-CM | POA: Diagnosis not present

## 2014-02-24 DIAGNOSIS — I10 Essential (primary) hypertension: Secondary | ICD-10-CM | POA: Diagnosis not present

## 2014-02-24 DIAGNOSIS — C9110 Chronic lymphocytic leukemia of B-cell type not having achieved remission: Secondary | ICD-10-CM | POA: Diagnosis not present

## 2014-02-24 DIAGNOSIS — Z7982 Long term (current) use of aspirin: Secondary | ICD-10-CM | POA: Diagnosis not present

## 2014-02-24 DIAGNOSIS — M7989 Other specified soft tissue disorders: Secondary | ICD-10-CM | POA: Diagnosis not present

## 2014-02-24 DIAGNOSIS — I82619 Acute embolism and thrombosis of superficial veins of unspecified upper extremity: Secondary | ICD-10-CM | POA: Diagnosis not present

## 2014-02-24 DIAGNOSIS — Z09 Encounter for follow-up examination after completed treatment for conditions other than malignant neoplasm: Secondary | ICD-10-CM | POA: Diagnosis not present

## 2014-02-24 DIAGNOSIS — I809 Phlebitis and thrombophlebitis of unspecified site: Secondary | ICD-10-CM | POA: Diagnosis not present

## 2014-03-06 DIAGNOSIS — D869 Sarcoidosis, unspecified: Secondary | ICD-10-CM | POA: Diagnosis not present

## 2014-03-06 DIAGNOSIS — H468 Other optic neuritis: Secondary | ICD-10-CM | POA: Diagnosis not present

## 2014-03-06 DIAGNOSIS — H5340 Unspecified visual field defects: Secondary | ICD-10-CM | POA: Diagnosis not present

## 2014-03-07 ENCOUNTER — Telehealth: Payer: Self-pay | Admitting: Hematology and Oncology

## 2014-03-07 NOTE — Telephone Encounter (Signed)
s.w. pt and sched appt...done...pt ok adn aware

## 2014-03-17 ENCOUNTER — Encounter: Payer: Self-pay | Admitting: Internal Medicine

## 2014-03-17 ENCOUNTER — Ambulatory Visit (INDEPENDENT_AMBULATORY_CARE_PROVIDER_SITE_OTHER): Payer: Medicare Other | Admitting: Internal Medicine

## 2014-03-17 VITALS — BP 140/76 | HR 93 | Temp 98.0°F | Resp 20

## 2014-03-17 DIAGNOSIS — I10 Essential (primary) hypertension: Secondary | ICD-10-CM

## 2014-03-17 DIAGNOSIS — J309 Allergic rhinitis, unspecified: Secondary | ICD-10-CM

## 2014-03-17 DIAGNOSIS — D8689 Sarcoidosis of other sites: Secondary | ICD-10-CM

## 2014-03-17 DIAGNOSIS — C9110 Chronic lymphocytic leukemia of B-cell type not having achieved remission: Secondary | ICD-10-CM | POA: Diagnosis not present

## 2014-03-17 DIAGNOSIS — D869 Sarcoidosis, unspecified: Secondary | ICD-10-CM

## 2014-03-17 NOTE — Progress Notes (Signed)
Subjective:    Patient ID: Sheryl [name], female    DOB: 1946/06/21, 68 y.o.   MRN: [id number]  HPI  68 year old patient who is seen today for followup.  She has a long history allergic rhinitis, and maintenance medications include Zyrtec and Flonase.  She is also on low-dose prednisone for neurosarcoidosis.  For the past several days.  She has had increasing sinus congestion, postnasal drip, and sinus pressure.  She is also on prophylactic Bactrim DS daily Past Medical History  Diagnosis Date  . 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 DEGENERATIVE JOINT DISEASE 12/14/2006  . EXOGENOUS OBESITY 12/14/2006  . HYPERLIPIDEMIA 12/14/2006  . HYPERTENSION 12/14/2006  . Neurosarcoidosis   . FIBROMYALGIA 12/14/2006  . Sleep apnea   . Hearing loss of left ear   . Vision loss of right eye   . IBS (irritable bowel syndrome)     History   Social History  . Marital Status: Married    Spouse Name: N/A    Number of Children: N/A  . Years of Education: N/A   Occupational History  . Not on file.   Social History Main Topics  . Smoking status: Never Smoker   . Smokeless tobacco: Never Used  . Alcohol Use: No     Comment: One drink per year  . Drug Use: No  . Sexual Activity: Not on file   Other Topics Concern  . Not on file   Social History Narrative  . No narrative on file    Past Surgical History  Procedure Laterality Date  . Cholecystectomy    . Tonsillectomy    . Spinal fusion  07/25/2012    Family History  Problem Relation Age of Onset  . Diabetes Sister     Allergies  Allergen Reactions  . Adhesive [Tape] Rash    Current Outpatient Prescriptions on File Prior to Visit  Medication Sig Dispense Refill  . aspirin 81 MG tablet Take 81 mg by mouth daily.        Marland Kitchen atorvastatin (LIPITOR) 40 MG tablet Take 1 tablet (40 mg total) by mouth daily.  90 tablet  3  . cetirizine (ZYRTEC) 10 MG tablet Take 10 mg by mouth daily.        . Cholecalciferol (D3-1000) 1000 UNITS capsule Take 1,000 Units  by mouth daily.      . cyclobenzaprine (FLEXERIL) 10 MG tablet Take 1 tablet (10 mg total) by mouth 3 (three) times daily as needed.  60 tablet  3  . fluticasone (FLONASE) 50 MCG/ACT nasal spray Place 2 sprays into the nose daily. OTC      . furosemide (LASIX) 20 MG tablet Take 1 tablet (20 mg total) by mouth daily.  30 tablet  3  . lisinopril (PRINIVIL,ZESTRIL) 40 MG tablet TAKE ONE TABLET BY MOUTH ONCE DAILY  90 tablet  3  . Multiple Vitamin (MULTIVITAMIN) tablet Take 1 tablet by mouth daily.        . nabumetone (RELAFEN) 500 MG tablet Take 1 tablet (500 mg total) by mouth 2 (two) times daily.  90 tablet  6  . omeprazole (PRILOSEC) 20 MG capsule Take 1 capsule (20 mg total) by mouth daily.  90 capsule  3  . ondansetron (ZOFRAN) 8 MG tablet Take 8 mg by mouth as needed.       . sulfamethoxazole-trimethoprim (BACTRIM DS) 800-160 MG per tablet Take 1 tablet by mouth 3 (three) times a week.  45 tablet  3  . traMADol (ULTRAM) 50 MG tablet  Take 1 tablet (50 mg total) by mouth every 6 (six) hours as needed.  90 tablet  4   No current facility-administered medications on file prior to visit.    BP 140/76  Pulse 93  Temp(Src) 98 F (36.7 C) (Oral)  Resp 20  SpO2 98%     Review of Systems  Constitutional: Positive for fatigue.  HENT: Positive for congestion, postnasal drip, rhinorrhea and sinus pressure. Negative for dental problem, hearing loss, sore throat and tinnitus.   Eyes: Negative for pain, discharge and visual disturbance.  Respiratory: Negative for cough and shortness of breath.   Cardiovascular: Negative for chest pain, palpitations and leg swelling.  Gastrointestinal: Negative for nausea, vomiting, abdominal pain, diarrhea, constipation, blood in stool and abdominal distention.  Genitourinary: Negative for dysuria, urgency, frequency, hematuria, flank pain, vaginal bleeding, vaginal discharge, difficulty urinating, vaginal pain and pelvic pain.  Musculoskeletal: Negative for  arthralgias, gait problem and joint swelling.  Skin: Negative for rash.  Neurological: Negative for dizziness, syncope, speech difficulty, weakness, numbness and headaches.  Hematological: Negative for adenopathy.  Psychiatric/Behavioral: Negative for behavioral problems, dysphoric mood and agitation. The patient is not nervous/anxious.        Objective:   Physical Exam  Constitutional: She is oriented to person, place, and time. She appears well-developed and well-nourished.  HENT:  Head: Normocephalic.  Right Ear: External ear normal.  Left Ear: External ear normal.  Mouth/Throat: Oropharynx is clear and moist.  No focal sinus tenderness  Eyes: Conjunctivae and EOM are normal. Pupils are equal, round, and reactive to light.  Neck: Normal range of motion. Neck supple. No thyromegaly present.  Cardiovascular: Normal rate, regular rhythm, normal heart sounds and intact distal pulses.   Pulmonary/Chest: Effort normal and breath sounds normal.  Abdominal: Soft. Bowel sounds are normal. She exhibits no mass. There is no tenderness.  Musculoskeletal: Normal range of motion.  Lymphadenopathy:    She has no cervical adenopathy.  Neurological: She is alert and oriented to person, place, and time.  Skin: Skin is warm and dry. No rash noted.  Psychiatric: She has a normal mood and affect. Her behavior is normal.          Assessment & Plan:   Flare allergic rhinitis.  Will Increase prednisone to 10 mg daily for 7 days Continue maintenance medications of Flonase and Zyrtec Hypertension stable CLL  CPX as scheduled next month

## 2014-03-17 NOTE — Progress Notes (Signed)
Pre visit review using our clinic review tool, if applicable. No additional management support is needed unless otherwise documented below in the visit note. 

## 2014-03-21 ENCOUNTER — Ambulatory Visit: Payer: Medicare Other | Admitting: Internal Medicine

## 2014-03-24 DIAGNOSIS — M4716 Other spondylosis with myelopathy, lumbar region: Secondary | ICD-10-CM | POA: Diagnosis not present

## 2014-03-24 DIAGNOSIS — M48061 Spinal stenosis, lumbar region without neurogenic claudication: Secondary | ICD-10-CM | POA: Diagnosis not present

## 2014-03-24 DIAGNOSIS — R262 Difficulty in walking, not elsewhere classified: Secondary | ICD-10-CM | POA: Diagnosis not present

## 2014-03-24 DIAGNOSIS — M542 Cervicalgia: Secondary | ICD-10-CM | POA: Diagnosis not present

## 2014-03-24 DIAGNOSIS — M4712 Other spondylosis with myelopathy, cervical region: Secondary | ICD-10-CM | POA: Diagnosis not present

## 2014-03-25 ENCOUNTER — Other Ambulatory Visit: Payer: Self-pay | Admitting: Orthopaedic Surgery

## 2014-03-25 DIAGNOSIS — M545 Low back pain: Secondary | ICD-10-CM

## 2014-03-25 DIAGNOSIS — M546 Pain in thoracic spine: Secondary | ICD-10-CM

## 2014-03-26 ENCOUNTER — Other Ambulatory Visit (HOSPITAL_BASED_OUTPATIENT_CLINIC_OR_DEPARTMENT_OTHER): Payer: Medicare Other

## 2014-03-26 ENCOUNTER — Encounter: Payer: Self-pay | Admitting: Hematology and Oncology

## 2014-03-26 ENCOUNTER — Ambulatory Visit (HOSPITAL_BASED_OUTPATIENT_CLINIC_OR_DEPARTMENT_OTHER): Payer: Medicare Other | Admitting: Hematology and Oncology

## 2014-03-26 ENCOUNTER — Telehealth: Payer: Self-pay | Admitting: Hematology and Oncology

## 2014-03-26 VITALS — BP 152/86 | HR 79 | Temp 98.0°F | Resp 18 | Ht 60.0 in | Wt 187.8 lb

## 2014-03-26 DIAGNOSIS — G722 Myopathy due to other toxic agents: Secondary | ICD-10-CM | POA: Diagnosis not present

## 2014-03-26 DIAGNOSIS — D8689 Sarcoidosis of other sites: Secondary | ICD-10-CM

## 2014-03-26 DIAGNOSIS — D869 Sarcoidosis, unspecified: Secondary | ICD-10-CM | POA: Diagnosis not present

## 2014-03-26 DIAGNOSIS — M549 Dorsalgia, unspecified: Secondary | ICD-10-CM | POA: Insufficient documentation

## 2014-03-26 DIAGNOSIS — I10 Essential (primary) hypertension: Secondary | ICD-10-CM | POA: Diagnosis not present

## 2014-03-26 DIAGNOSIS — C9110 Chronic lymphocytic leukemia of B-cell type not having achieved remission: Secondary | ICD-10-CM | POA: Diagnosis not present

## 2014-03-26 DIAGNOSIS — Z299 Encounter for prophylactic measures, unspecified: Secondary | ICD-10-CM | POA: Insufficient documentation

## 2014-03-26 DIAGNOSIS — G720 Drug-induced myopathy: Secondary | ICD-10-CM | POA: Insufficient documentation

## 2014-03-26 DIAGNOSIS — T380X5A Adverse effect of glucocorticoids and synthetic analogues, initial encounter: Secondary | ICD-10-CM

## 2014-03-26 DIAGNOSIS — E119 Type 2 diabetes mellitus without complications: Secondary | ICD-10-CM | POA: Diagnosis not present

## 2014-03-26 DIAGNOSIS — M546 Pain in thoracic spine: Secondary | ICD-10-CM

## 2014-03-26 HISTORY — DX: Adverse effect of glucocorticoids and synthetic analogues, initial encounter: T38.0X5A

## 2014-03-26 HISTORY — DX: Drug-induced myopathy: G72.0

## 2014-03-26 LAB — CBC WITH DIFFERENTIAL/PLATELET
BASO%: 0.5 % (ref 0.0–2.0)
Basophils Absolute: 0 10*3/uL (ref 0.0–0.1)
EOS%: 1.8 % (ref 0.0–7.0)
Eosinophils Absolute: 0.1 10*3/uL (ref 0.0–0.5)
HCT: 37.7 % (ref 34.8–46.6)
HGB: 12 g/dL (ref 11.6–15.9)
LYMPH%: 8.3 % — ABNORMAL LOW (ref 14.0–49.7)
MCH: 31.3 pg (ref 25.1–34.0)
MCHC: 31.8 g/dL (ref 31.5–36.0)
MCV: 98.2 fL (ref 79.5–101.0)
MONO#: 0.3 10*3/uL (ref 0.1–0.9)
MONO%: 5.1 % (ref 0.0–14.0)
NEUT#: 5.1 10*3/uL (ref 1.5–6.5)
NEUT%: 84.3 % — ABNORMAL HIGH (ref 38.4–76.8)
Platelets: 156 10*3/uL (ref 145–400)
RBC: 3.84 10*6/uL (ref 3.70–5.45)
RDW: 14.6 % — ABNORMAL HIGH (ref 11.2–14.5)
WBC: 6 10*3/uL (ref 3.9–10.3)
lymph#: 0.5 10*3/uL — ABNORMAL LOW (ref 0.9–3.3)

## 2014-03-26 LAB — COMPREHENSIVE METABOLIC PANEL (CC13)
ALT: 12 U/L (ref 0–55)
AST: 13 U/L (ref 5–34)
Albumin: 3.6 g/dL (ref 3.5–5.0)
Alkaline Phosphatase: 87 U/L (ref 40–150)
Anion Gap: 10 mEq/L (ref 3–11)
BUN: 15.7 mg/dL (ref 7.0–26.0)
CO2: 27 mEq/L (ref 22–29)
Calcium: 9.4 mg/dL (ref 8.4–10.4)
Chloride: 107 mEq/L (ref 98–109)
Creatinine: 0.9 mg/dL (ref 0.6–1.1)
Glucose: 88 mg/dl (ref 70–140)
Potassium: 3.9 mEq/L (ref 3.5–5.1)
Sodium: 144 mEq/L (ref 136–145)
Total Bilirubin: 0.32 mg/dL (ref 0.20–1.20)
Total Protein: 6.1 g/dL — ABNORMAL LOW (ref 6.4–8.3)

## 2014-03-26 NOTE — Assessment & Plan Note (Signed)
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 She had recurrent falls. I recommend physical therapy and rehabilitation.

## 2014-03-26 NOTE — Assessment & Plan Note (Signed)
She has moderate back pain and recent fall. She is seeing a spine surgeon with planned further evaluation with MRI to exclude compression fracture. 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 I recommend high-dose vitamin D supplement along with calcium supplement. She may benefit from bisphosphonates therapy if she have confirmed compression fracture.

## 2014-03-26 NOTE — Progress Notes (Signed)
[name]  Patient Care Team: Marletta Lor, MD as PCP - General Eston Esters, MD (Hematology and Oncology) Annia Belt, MD as Consulting Physician (Oncology) Marijean Bravo, MD as Consulting Physician (Rheumatology) Sundra Aland, MD (Rehabilitation)  CHIEF COMPLAINTS/PURPOSE OF VISIT:  CLL  HISTORY OF PRESENTING ILLNESS:  Sheryl [name] 68 y.o. female was transferred to my care after her prior physician has left.  I reviewed the patient's records extensive and collaborated the history with the patient. Summary of her history is as follows: This patient had diagnosis of stage 0 CLL. Since she was seen here in February, she was diagnosed with neurosarcoidosis at [facility] She had almost left eye blindness. 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 The patient was placed on high-dose corticosteroid therapy followed by oral cyclophosphamide. She had recurrent side effects with nausea, anorexia, dehydration and diverticulitis related to side effects of treatment. The patient self discontinue cyclophosphamide about a month ago. She is maintained on 5 mg chronic prednisone therapy. She had uncontrolled blood sugar and hypertension while on high-dose prednisone. She also developed profound weakness and had mild difficulties controlling the bowel and bladder function. She had recurrent falls recently and have severe back pain thought to be related to compression fracture. She has seen neurosurgery with planned MRI for further evaluation. She denies recent infection. Denies new lymphadenopathy.  MEDICAL HISTORY:  Past Medical History  Diagnosis Date  . 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 DEGENERATIVE JOINT DISEASE 12/14/2006  . EXOGENOUS OBESITY 12/14/2006  . HYPERLIPIDEMIA 12/14/2006  . HYPERTENSION 12/14/2006  . Neurosarcoidosis   . FIBROMYALGIA 12/14/2006  . Sleep apnea   . Hearing loss of left ear   . Vision loss of  right eye   . IBS (irritable bowel syndrome)     SURGICAL HISTORY: Past Surgical History  Procedure Laterality Date  . Cholecystectomy    . Tonsillectomy    . Spinal fusion  07/25/2012    SOCIAL HISTORY: History   Social History  . Marital Status: Married    Spouse Name: N/A    Number of Children: N/A  . Years of Education: N/A   Occupational History  . Not on file.   Social History Main Topics  . Smoking status: Never Smoker   . Smokeless tobacco: Never Used  . Alcohol Use: No     Comment: One drink per year  . Drug Use: No  . Sexual Activity: Not on file   Other Topics Concern  . Not on file   Social History Narrative  . No narrative on file    FAMILY HISTORY: Family History  Problem Relation Age of Onset  . Diabetes Sister     ALLERGIES:  is allergic to adhesive.  MEDICATIONS:  Current Outpatient Prescriptions  Medication Sig Dispense Refill  . aspirin 81 MG tablet Take 81 mg by mouth daily.        Marland Kitchen atorvastatin (LIPITOR) 40 MG tablet Take 1 tablet (40 mg total) by mouth daily.  90 tablet  3  . cetirizine (ZYRTEC) 10 MG tablet Take 10 mg by mouth daily.        . Cholecalciferol (D3-1000) 1000 UNITS capsule Take 1,000 Units by mouth daily.      . cyclobenzaprine (FLEXERIL) 10 MG tablet Take 1 tablet (10 mg total) by mouth 3 (three) times daily as needed.  60 tablet  3  . fluticasone (FLONASE) 50 MCG/ACT nasal spray Place 2 sprays into the  nose daily. OTC      . furosemide (LASIX) 20 MG tablet Take 1 tablet (20 mg total) by mouth daily.  30 tablet  3  . lisinopril (PRINIVIL,ZESTRIL) 40 MG tablet TAKE ONE TABLET BY MOUTH ONCE DAILY  90 tablet  3  . Multiple Vitamin (MULTIVITAMIN) tablet Take 1 tablet by mouth daily.        . nabumetone (RELAFEN) 500 MG tablet Take 1 tablet (500 mg total) by mouth 2 (two) times daily.  90 tablet  6  . omeprazole (PRILOSEC) 20 MG capsule Take 1 capsule (20 mg total) by mouth daily.  90 capsule  3  . ondansetron (ZOFRAN) 8  MG tablet Take 8 mg by mouth as needed.       . predniSONE (DELTASONE) 20 MG tablet Take 5 mg by mouth daily with breakfast.      . sulfamethoxazole-trimethoprim (BACTRIM DS) 800-160 MG per tablet Take 1 tablet by mouth 3 (three) times a week.  45 tablet  3  . traMADol (ULTRAM) 50 MG tablet Take 1 tablet (50 mg total) by mouth every 6 (six) hours as needed.  90 tablet  4   No current facility-administered medications for this visit.    REVIEW OF SYSTEMS:   Constitutional: Denies fevers, chills or abnormal night sweats Ears, nose, mouth, throat, and face: Denies mucositis or sore throat Respiratory: Denies cough, dyspnea or wheezes Cardiovascular: Denies palpitation, chest discomfort or lower extremity swelling Gastrointestinal:  Denies nausea, heartburn or change in bowel habits Skin: Denies abnormal skin rashes Behavioral/Psych: Mood is stable, no new changes  All other systems were reviewed with the patient and are negative.  PHYSICAL EXAMINATION: ECOG PERFORMANCE STATUS: 2 - Symptomatic, <50% confined to bed  Filed Vitals:   03/26/14 0822  BP: 152/86  Pulse: 79  Temp: 98 F (36.7 C)  Resp: 18   Filed Weights   03/26/14 0822  Weight: 187 lb 12.8 oz (85.186 kg)    GENERAL:alert, no distress and comfortable. She is obese and appear cushingoid.. Mild bruises on the lipid SKIN: skin color, texture, turgor are normal, no rashes or significant lesions EYES: normal, conjunctiva are pink and non-injected, sclera clear OROPHARYNX:no exudate, normal lips, buccal mucosa, and tongue  NECK: supple, thyroid normal size, non-tender, without nodularity LYMPH:  no palpable lymphadenopathy in the cervical, axillary or inguinal LUNGS: clear to auscultation and percussion with normal breathing effort HEART: regular rate & rhythm and no murmurs without lower extremity edema ABDOMEN:abdomen soft, non-tender and normal bowel sounds Musculoskeletal:no cyanosis of digits and no clubbing  PSYCH:  alert & oriented x 3 with fluent speech NEURO: no focal motor/sensory deficits. Proximal myopathy is noted  LABORATORY DATA:  I have reviewed the data as listed Lab Results  Component Value Date   WBC 6.0 03/26/2014   HGB 12.0 03/26/2014   HCT 37.7 03/26/2014   MCV 98.2 03/26/2014   PLT 156 03/26/2014    Recent Labs  08/16/13 1134 02/16/14 1020 02/21/14 1157 03/26/14 0803  NA 144 147 147* 144  K 3.9 3.1* 3.2* 3.9  CL  --  109 107  --   CO2 28 25 29 27   GLUCOSE 87 88 106* 88  BUN 15.7 19 11  15.7  CREATININE 0.8 0.86 0.9 0.9  CALCIUM 10.2 9.0 8.9 9.4  GFRNONAA  --  69*  --   --   GFRAA  --  80*  --   --   PROT 7.0  --   --  6.1*  ALBUMIN 4.2  --   --  3.6  AST 20  --   --  13  ALT 16  --   --  12  ALKPHOS 163*  --   --  87  BILITOT 0.29  --   --  0.32    ASSESSMENT & PLAN:  CLL Her prednisone therapy and recent treatment with Cytoxan has partially treated CLL. I recommend visit in 6 months with history, physical examination and blood work.  Neurosarcoidosis I am very concerned about her neurological deterioration. Recommend neurology consult here. Currently, she is on low-dose maintenance prednisone.  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 She had recurrent falls. I recommend physical therapy and rehabilitation.  Back pain She has moderate back pain and recent fall. She is seeing a spine surgeon with planned further evaluation with MRI to exclude compression fracture. 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 I recommend high-dose vitamin D supplement along with calcium supplement. She may benefit from bisphosphonates therapy if she have confirmed compression fracture.  Preventive measure I discussed with the patient the importance of preventive vaccination programs. I recommend she gets pneumococcal vaccination and influenza vaccination from her PCP. I recommend against Zoster vaccine.   Orders  Placed This Encounter  Procedures  . CBC with Differential    Standing Status: Future     Number of Occurrences:      Standing Expiration Date: 04/30/2015  . Comprehensive metabolic panel    Standing Status: Future     Number of Occurrences:      Standing Expiration Date: 04/30/2015  . Lactate dehydrogenase    Standing Status: Future     Number of Occurrences:      Standing Expiration Date: 04/30/2015  . Ambulatory referral to Neurology    Referral Priority:  Urgent    Referral Type:  Consultation    Referral Reason:  Specialty Services Required    Requested Specialty:  Neurology    Number of Visits Requested:  1    All questions were answered. The patient knows to call the clinic with any problems, questions or concerns. I spent 30 minutes counseling the patient face to face. The total time spent in the appointment was 40 minutes and more than 50% was on counseling.     [place], Jenkins, MD 03/26/2014 9:29 AM

## 2014-03-26 NOTE — Assessment & Plan Note (Signed)
Her prednisone therapy and recent treatment with Cytoxan has partially treated CLL. I recommend visit in 6 months with history, physical examination and blood work.

## 2014-03-26 NOTE — Assessment & Plan Note (Signed)
I am very concerned about her neurological deterioration. Recommend neurology consult here. Currently, she is on low-dose maintenance prednisone.

## 2014-03-26 NOTE — Assessment & Plan Note (Signed)
I discussed with the patient the importance of preventive vaccination programs. I recommend she gets pneumococcal vaccination and influenza vaccination from her PCP. I recommend against Zoster vaccine.

## 2014-03-28 ENCOUNTER — Encounter: Payer: Self-pay | Admitting: Diagnostic Neuroimaging

## 2014-03-28 ENCOUNTER — Ambulatory Visit (INDEPENDENT_AMBULATORY_CARE_PROVIDER_SITE_OTHER): Payer: Medicare Other | Admitting: Diagnostic Neuroimaging

## 2014-03-28 VITALS — BP 142/76 | HR 70 | Ht 60.0 in | Wt 185.8 lb

## 2014-03-28 DIAGNOSIS — D8689 Sarcoidosis of other sites: Secondary | ICD-10-CM | POA: Diagnosis not present

## 2014-03-28 DIAGNOSIS — H468 Other optic neuritis: Secondary | ICD-10-CM | POA: Diagnosis not present

## 2014-03-28 DIAGNOSIS — H469 Unspecified optic neuritis: Secondary | ICD-10-CM

## 2014-03-28 DIAGNOSIS — G959 Disease of spinal cord, unspecified: Secondary | ICD-10-CM

## 2014-03-28 NOTE — Patient Instructions (Signed)
Please get more information from [facility]. I will try myself also. Follow up here in 1 month.

## 2014-03-28 NOTE — Progress Notes (Signed)
[facility]  PATIENT: Sheryl [name] DOB: 1945/08/07  REFERRING CLINICIAN: Ni Gorsuch HISTORY FROM: patient and husband  REASON FOR VISIT: new consult    HISTORICAL  CHIEF COMPLAINT:  Chief Complaint  Patient presents with  . New Evaluation     rm 6 neurosarcodosis    HISTORY OF PRESENT ILLNESS:   68 year old right-handed female with history of stage 0 CLL, neurosarcoidosis, bilateral optic neuropathy, hypertension, hypercholesterolemia, sleep apnea, fibromyalgia, here for evaluation of neurosarcoidosis.  1999 patient developed confusion, weight loss, blurred vision, and was evaluated by multiple physicians, clinics, hospitals. Ultimately she went to [facility], had CSF testing, brain biopsy, and was diagnosed with neurosarcoidosis. 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 Patient was started on prednisone and cyclosporine. She was managed by neurology and ophthalmology over many years. At 2002 she was tapered off of prednisone and cyclosporine. Her symptoms are stable for many years. She had followup eye examinations every 6 months.  May 2015 patient had new onset of left eye vision problems. Patient went to her ophthalmologist at [facility], had MRI of the orbits, which showed new enhancement in the left optic nerve. Patient was admitted to the hospital for IV Solu-Medrol. She was then transitioned to high-dose prednisone and cyclophosphamide. Prednisone was gradually tapered. She was intolerant of cyclophosphamide due to nausea, anorexia, weakness, hair loss. Patient stopped cyclophosphamide in July 2015 and side effects improved. She was encouraged to restart in August 2015, but stopped it within a few weeks.   Also of note patient and husband are somewhat frustrated due to not hearing back from [facility] ophthalmology and neurology clinic, and they're unsure of what the plan is next. Patient's local oncologist  also advised patient to establish with a local neurologist to facilitate care.    REVIEW OF SYSTEMS: Full 14 system review of systems performed and notable only for weight loss fatigue hearing loss loss of vision incontinence allergy numbness weakness poor appetite.   ALLERGIES: Allergies  Allergen Reactions  . Adhesive [Tape] Rash    HOME MEDICATIONS: Outpatient Prescriptions Prior to Visit  Medication Sig Dispense Refill  . aspirin 81 MG tablet Take 81 mg by mouth daily.        Marland Kitchen atorvastatin (LIPITOR) 40 MG tablet Take 1 tablet (40 mg total) by mouth daily.  90 tablet  3  . cetirizine (ZYRTEC) 10 MG tablet Take 10 mg by mouth daily.        . Cholecalciferol (D3-1000) 1000 UNITS capsule Take 1,000 Units by mouth daily.      . cyclobenzaprine (FLEXERIL) 10 MG tablet Take 1 tablet (10 mg total) by mouth 3 (three) times daily as needed.  60 tablet  3  . fluticasone (FLONASE) 50 MCG/ACT nasal spray Place 2 sprays into the nose daily. OTC      . furosemide (LASIX) 20 MG tablet Take 1 tablet (20 mg total) by mouth daily.  30 tablet  3  . lisinopril (PRINIVIL,ZESTRIL) 40 MG tablet TAKE ONE TABLET BY MOUTH ONCE DAILY  90 tablet  3  . omeprazole (PRILOSEC) 20 MG capsule Take 1 capsule (20 mg total) by mouth daily.  90 capsule  3  . ondansetron (ZOFRAN) 8 MG tablet Take 8 mg by mouth as needed.       . predniSONE (DELTASONE) 20 MG tablet Take 5 mg by mouth daily with breakfast.      . sulfamethoxazole-trimethoprim (BACTRIM DS) 800-160 MG per tablet Take 1 tablet by mouth  3 (three) times a week.  45 tablet  3  . traMADol (ULTRAM) 50 MG tablet Take 1 tablet (50 mg total) by mouth every 6 (six) hours as needed.  90 tablet  4  . Multiple Vitamin (MULTIVITAMIN) tablet Take 1 tablet by mouth daily.        . nabumetone (RELAFEN) 500 MG tablet Take 1 tablet (500 mg total) by mouth 2 (two) times daily.  90 tablet  6   No facility-administered medications prior to visit.    PAST MEDICAL  HISTORY: Past Medical History  Diagnosis Date  . 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 DEGENERATIVE JOINT DISEASE 12/14/2006  . EXOGENOUS OBESITY 12/14/2006  . HYPERLIPIDEMIA 12/14/2006  . HYPERTENSION 12/14/2006  . Neurosarcoidosis   . FIBROMYALGIA 12/14/2006  . Sleep apnea   . Hearing loss of left ear   . Vision loss of right eye   . IBS (irritable bowel syndrome)     PAST SURGICAL HISTORY: Past Surgical History  Procedure Laterality Date  . Cholecystectomy    . Tonsillectomy    . Spinal fusion  07/25/2012  . Brain biopsy      [facility]    FAMILY HISTORY: Family History  Problem Relation Age of Onset  . Diabetes Sister   . Hypertension Mother   . Emphysema Father     SOCIAL HISTORY:  History   Social History  . Marital Status: Married    Spouse Name: N/A    Number of Children: 0  . Years of Education: hs   Occupational History  . disabled   . clerical    Social History Main Topics  . Smoking status: Never Smoker   . Smokeless tobacco: Never Used  . Alcohol Use: No     Comment: One drink per year  . Drug Use: No  . Sexual Activity: Not on file   Other Topics Concern  . Not on file   Social History Narrative  . No narrative on file     PHYSICAL EXAM  Filed Vitals:   03/28/14 1130  BP: 142/76  Pulse: 70  Height: 5' (1.524 m)  Weight: 185 lb 12.8 oz (84.278 kg)    Not recorded    Body mass index is 36.29 kg/(m^2).  GENERAL EXAM: Patient is in no distress; well developed, nourished and groomed; neck is supple  CARDIOVASCULAR: Regular rate and rhythm, no murmurs, no carotid bruits  NEUROLOGIC: MENTAL STATUS: awake, alert, oriented to person, place and time, recent and remote memory intact, normal attention and concentration, language fluent, comprehension intact, naming intact, fund of knowledge appropriate; PSYCHOMOTOR SLOWING CRANIAL NERVE: no papilledema on fundoscopic exam, RIGHT EYE RAPD; LEFT EYE REACTIVE TO LIGHT. ABLE TO COUNT FINGERS IN LEFT EYE. LIGHT  PERCEPTION AND MOTION PERCEPTION IN RIGHT EYE. Extraocular muscles intact, no nystagmus, facial sensation and strength symmetric, hearing intact, palate elevates symmetrically, uvula midline, shoulder shrug symmetric, tongue midline. MOTOR: normal bulk and tone, full strength in the BUE; BLE (HF 4+, KE 5, KF 4, DF 5) SENSORY: normal and symmetric to light touch, pinprick, temperature, vibration and proprioception COORDINATION: finger-nose-finger, fine finger movements, heel-shin normal REFLEXES: BUE 1, BLE TRACE GAIT/STATION: IN WHEELCHAIR;     DIAGNOSTIC DATA (LABS, IMAGING, TESTING) - I reviewed patient records, labs, notes, testing and imaging myself where available.  Lab Results  Component Value Date   WBC 6.0 03/26/2014   HGB 12.0 03/26/2014   HCT 37.7 03/26/2014   MCV 98.2 03/26/2014   PLT 156 03/26/2014  Component Value Date/Time   NA 144 03/26/2014 0803   NA 147* 02/21/2014 1157   K 3.9 03/26/2014 0803   K 3.2* 02/21/2014 1157   CL 107 02/21/2014 1157   CL 105 08/24/2012 1202   CO2 27 03/26/2014 0803   CO2 29 02/21/2014 1157   GLUCOSE 88 03/26/2014 0803   GLUCOSE 106* 02/21/2014 1157   GLUCOSE 110* 08/24/2012 1202   GLUCOSE 88 05/29/2006 1020   BUN 15.7 03/26/2014 0803   BUN 11 02/21/2014 1157   CREATININE 0.9 03/26/2014 0803   CREATININE 0.9 02/21/2014 1157   CALCIUM 9.4 03/26/2014 0803   CALCIUM 8.9 02/21/2014 1157   PROT 6.1* 03/26/2014 0803   PROT 5.9* 02/10/2012 1051   ALBUMIN 3.6 03/26/2014 0803   ALBUMIN 3.7 02/10/2012 1051   AST 13 03/26/2014 0803   AST 19 02/10/2012 1051   ALT 12 03/26/2014 0803   ALT 29 02/10/2012 1051   ALKPHOS 87 03/26/2014 0803   ALKPHOS 100 02/10/2012 1051   BILITOT 0.32 03/26/2014 0803   BILITOT 0.3 02/10/2012 1051   GFRNONAA 69* 02/16/2014 1020   GFRAA 80* 02/16/2014 1020   Lab Results  Component Value Date   CHOL 242* 04/17/2012   HDL 48.40 04/17/2012   LDLDIRECT 164.3 04/17/2012   TRIG 204.0* 04/17/2012   CHOLHDL 5 04/17/2012   Lab Results    Component Value Date   HGBA1C 5.7 04/17/2012   No results found for this basename: [id number]   Lab Results  Component Value Date   TSH 1.50 12/10/2007    11/06/13 MRI ORBITS ([facility]; report only via care everywhere) 1. Findings indicative of left optic neuritis. This is new from prior. 2. T2 hyperintensity without enhancement in the intraorbital right optic nerve, which may have been present on the prior but was more difficult to see due to motion. This may be related to previous optic neuritis. 3. Stable right temporal resection cavity.  05/05/13 MRI cervical spine (I reviewed images myself and agree with interpretation. -VRP) 1. Moderate central canal stenosis at C5-6 secondary to abroad-based disc osteophyte complex. 2. Mild to moderate central canal stenosis at C6-7. 3. Mild to moderate left central canal stenosis at C4-5. 4. Mild central and left foraminal stenosis at C3-4. 5. Moderate right foraminal stenosis at C4-5. 6. Mild to moderate foraminal stenosis bilaterally at C5-6. 7. Moderate left and mild right foraminal stenosis at C6-7.  07/20/12 MRI lumbar (I reviewed images myself and agree with interpretation. -VRP) 1. Severe spinal stenosis at L4-5 with grade 1 spondylolisthesis. Nerve roots are trapped at this level. The thecal sac is almost obliterated. Severe right foraminal stenosis at L4-5. 2. Far lateral disc protrusion at L3-4 to the right with mass effect upon the L3 nerve lateral to the neural foramen.  3. Small focal disc protrusion at L1-2 into the left lateral recess which could affect the left L2 nerve. 4. Mild to moderate spinal stenosis at L2-3.    ASSESSMENT AND PLAN  68 y.o. year old female here with long history of neurosarcoidosis, mainly with bilateral optic neuropathy sequelae, also with cervical and lumbar spinal stenosis status post cervical and lumbar decompression and fusion surgeries. Advised patient to continue followup with [facility]  ophthalmology and neurology regarding neurosarcoidosis management. Once decision is made about long-term immunosuppression, we can facilitate local followup. Also patient having diffuse weakness, balance difficulty, likely related to sequelae from cervical and lumbar spinal stenosis. She is scheduled for additional neuroimaging of the thoracic and lumbar  spine to evaluate compression fracture of lower back.  PLAN: - continue follow up with [facility] ophthal and neurology until decision is made re: long term immunosuppression - continue ortho follow up of cervical and lumbar spine spinal stenosis   Return in about 1 month (around 04/28/2014).    Penni Bombard, MD 51/01/9841, 10:31 PM Certified in Neurology, Neurophysiology and Neuroimaging  [facility] [address] [place], [place] [address] [phone]

## 2014-04-03 DIAGNOSIS — H469 Unspecified optic neuritis: Secondary | ICD-10-CM | POA: Diagnosis not present

## 2014-04-03 DIAGNOSIS — D8689 Sarcoidosis of other sites: Secondary | ICD-10-CM | POA: Diagnosis not present

## 2014-04-07 ENCOUNTER — Ambulatory Visit
Admission: RE | Admit: 2014-04-07 | Discharge: 2014-04-07 | Disposition: A | Discharge status: Home / Self Care | Payer: Medicare Other | Source: Ambulatory Visit | Attending: Orthopaedic Surgery | Admitting: Orthopaedic Surgery

## 2014-04-07 DIAGNOSIS — M546 Pain in thoracic spine: Secondary | ICD-10-CM

## 2014-04-07 DIAGNOSIS — M545 Low back pain: Secondary | ICD-10-CM

## 2014-04-07 DIAGNOSIS — M4806 Spinal stenosis, lumbar region: Secondary | ICD-10-CM | POA: Diagnosis not present

## 2014-04-07 DIAGNOSIS — Z981 Arthrodesis status: Secondary | ICD-10-CM | POA: Diagnosis not present

## 2014-04-07 DIAGNOSIS — M47814 Spondylosis without myelopathy or radiculopathy, thoracic region: Secondary | ICD-10-CM | POA: Diagnosis not present

## 2014-04-07 DIAGNOSIS — M5126 Other intervertebral disc displacement, lumbar region: Secondary | ICD-10-CM | POA: Diagnosis not present

## 2014-04-16 ENCOUNTER — Ambulatory Visit (INDEPENDENT_AMBULATORY_CARE_PROVIDER_SITE_OTHER): Payer: Medicare Other | Admitting: Internal Medicine

## 2014-04-16 ENCOUNTER — Encounter: Payer: Self-pay | Admitting: Internal Medicine

## 2014-04-16 VITALS — BP 130/80 | HR 104 | Temp 97.4°F | Resp 20 | Ht 60.0 in | Wt 186.0 lb

## 2014-04-16 DIAGNOSIS — M4806 Spinal stenosis, lumbar region: Secondary | ICD-10-CM | POA: Diagnosis not present

## 2014-04-16 DIAGNOSIS — H468 Other optic neuritis: Secondary | ICD-10-CM | POA: Diagnosis not present

## 2014-04-16 DIAGNOSIS — Z23 Encounter for immunization: Secondary | ICD-10-CM

## 2014-04-16 DIAGNOSIS — Z0000 Encounter for general adult medical examination without abnormal findings: Secondary | ICD-10-CM | POA: Diagnosis not present

## 2014-04-16 DIAGNOSIS — D8689 Sarcoidosis of other sites: Secondary | ICD-10-CM

## 2014-04-16 DIAGNOSIS — M8949 Other hypertrophic osteoarthropathy, multiple sites: Secondary | ICD-10-CM

## 2014-04-16 DIAGNOSIS — M810 Age-related osteoporosis without current pathological fracture: Secondary | ICD-10-CM

## 2014-04-16 DIAGNOSIS — E785 Hyperlipidemia, unspecified: Secondary | ICD-10-CM

## 2014-04-16 DIAGNOSIS — C9110 Chronic lymphocytic leukemia of B-cell type not having achieved remission: Secondary | ICD-10-CM | POA: Diagnosis not present

## 2014-04-16 DIAGNOSIS — M150 Primary generalized (osteo)arthritis: Secondary | ICD-10-CM

## 2014-04-16 DIAGNOSIS — M159 Polyosteoarthritis, unspecified: Secondary | ICD-10-CM

## 2014-04-16 DIAGNOSIS — I10 Essential (primary) hypertension: Secondary | ICD-10-CM | POA: Diagnosis not present

## 2014-04-16 DIAGNOSIS — M48061 Spinal stenosis, lumbar region without neurogenic claudication: Secondary | ICD-10-CM

## 2014-04-16 DIAGNOSIS — H469 Unspecified optic neuritis: Secondary | ICD-10-CM

## 2014-04-16 NOTE — Progress Notes (Signed)
Pre visit review using our clinic review tool, if applicable. No additional management support is needed unless otherwise documented below in the visit note. 

## 2014-04-16 NOTE — Patient Instructions (Addendum)
Limit your sodium (Salt) intake  Return in 6 months for follow-up  Take a calcium supplement, plus [phone] units of vitamin DHealth Maintenance Adopting a healthy lifestyle and getting preventive care can go a long way to promote health and wellness. Talk with your health care provider about what schedule of regular examinations is right for you. This is a good chance for you to check in with your provider about disease prevention and staying healthy. In between checkups, there are plenty of things you can do on your own. Experts have done a lot of research about which lifestyle changes and preventive measures are most likely to keep you healthy. Ask your health care provider for more information. WEIGHT AND DIET  Eat a healthy diet  Be sure to include plenty of vegetables, fruits, low-fat dairy products, and lean protein.  Do not eat a lot of foods high in solid fats, added sugars, or salt.  Get regular exercise. This is one of the most important things you can do for your health.  Most adults should exercise for at least 150 minutes each week. The exercise should increase your heart rate and make you sweat (moderate-intensity exercise).  Most adults should also do strengthening exercises at least twice a week. This is in addition to the moderate-intensity exercise.  Maintain a healthy weight  Body mass index (BMI) is a measurement that can be used to identify possible weight problems. It estimates body fat based on height and weight. Your health care provider can help determine your BMI and help you achieve or maintain a healthy weight.  For females 48 years of age and older:   A BMI below 18.5 is considered underweight.  A BMI of 18.5 to 24.9 is normal.  A BMI of 25 to 29.9 is considered overweight.  A BMI of 30 and above is considered obese.  Watch levels of cholesterol and blood lipids  You should start having your blood tested for lipids and cholesterol at 66 years of age,  then have this test every 5 years.  You may need to have your cholesterol levels checked more often if:  Your lipid or cholesterol levels are high.  You are older than 66 years of age.  You are at high risk for heart disease.  CANCER SCREENING   Lung Cancer  Lung cancer screening is recommended for adults 24-8 years old who are at high risk for lung cancer because of a history of smoking.  A yearly low-dose CT scan of the lungs is recommended for people who:  Currently smoke.  Have quit within the past 15 years.  Have at least a 30-pack-year history of smoking. A pack year is smoking an average of one pack of cigarettes a day for 1 year.  Yearly screening should continue until it has been 15 years since you quit.  Yearly screening should stop if you develop a health problem that would prevent you from having lung cancer treatment.  Breast Cancer  Practice breast self-awareness. This means understanding how your breasts normally appear and feel.  It also means doing regular breast self-exams. Let your health care provider know about any changes, no matter how small.  If you are in your 20s or 30s, you should have a clinical breast exam (CBE) by a health care provider every 1-3 years as part of a regular health exam.  If you are 29 or older, have a CBE every year. Also consider having a breast X-ray (mammogram) every year.  If you have a family history of breast cancer, talk to your health care provider about genetic screening.  If you are at high risk for breast cancer, talk to your health care provider about having an MRI and a mammogram every year.  Breast cancer gene (BRCA) assessment is recommended for women who have family members with BRCA-related cancers. BRCA-related cancers include:  Breast.  Ovarian.  Tubal.  Peritoneal cancers.  Results of the assessment will determine the need for genetic counseling and BRCA1 and BRCA2 testing. Cervical  Cancer Routine pelvic examinations to screen for cervical cancer are no longer recommended for nonpregnant women who are considered low risk for cancer of the pelvic organs (ovaries, uterus, and vagina) and who do not have symptoms. A pelvic examination may be necessary if you have symptoms including those associated with pelvic infections. Ask your health care provider if a screening pelvic exam is right for you.   The Pap test is the screening test for cervical cancer for women who are considered at risk.  If you had a hysterectomy for a problem that was not cancer or a condition that could lead to cancer, then you no longer need Pap tests.  If you are older than 65 years, and you have had normal Pap tests for the past 10 years, you no longer need to have Pap tests.  If you have had past treatment for cervical cancer or a condition that could lead to cancer, you need Pap tests and screening for cancer for at least 20 years after your treatment.  If you no longer get a Pap test, assess your risk factors if they change (such as having a new sexual partner). This can affect whether you should start being screened again.  Some women have medical problems that increase their chance of getting cervical cancer. If this is the case for you, your health care provider may recommend more frequent screening and Pap tests.  The human papillomavirus (HPV) test is another test that may be used for cervical cancer screening. The HPV test looks for the virus that can cause cell changes in the cervix. The cells collected during the Pap test can be tested for HPV.  The HPV test can be used to screen women 16 years of age and older. Getting tested for HPV can extend the interval between normal Pap tests from three to five years.  An HPV test also should be used to screen women of any age who have unclear Pap test results.  After 66 years of age, women should have HPV testing as often as Pap tests.  Colorectal  Cancer  This type of cancer can be detected and often prevented.  Routine colorectal cancer screening usually begins at 66 years of age and continues through 66 years of age.  Your health care provider may recommend screening at an earlier age if you have risk factors for colon cancer.  Your health care provider may also recommend using home test kits to check for hidden blood in the stool.  A small camera at the end of a tube can be used to examine your colon directly (sigmoidoscopy or colonoscopy). This is done to check for the earliest forms of colorectal cancer.  Routine screening usually begins at age 12.  Direct examination of the colon should be repeated every 5-10 years through 66 years of age. However, you may need to be screened more often if early forms of precancerous polyps or small growths are found. Skin Cancer  Check your skin from head to toe regularly.  Tell your health care provider about any new moles or changes in moles, especially if there is a change in a mole's shape or color.  Also tell your health care provider if you have a mole that is larger than the size of a pencil eraser.  Always use sunscreen. Apply sunscreen liberally and repeatedly throughout the day.  Protect yourself by wearing long sleeves, pants, a wide-brimmed hat, and sunglasses whenever you are outside. HEART DISEASE, DIABETES, AND HIGH BLOOD PRESSURE   Have your blood pressure checked at least every 1-2 years. High blood pressure causes heart disease and increases the risk of stroke.  If you are between 40 years and 83 years old, ask your health care provider if you should take aspirin to prevent strokes.  Have regular diabetes screenings. This involves taking a blood sample to check your fasting blood sugar level.  If you are at a normal weight and have a low risk for diabetes, have this test once every three years after 66 years of age.  If you are overweight and have a high risk for  diabetes, consider being tested at a younger age or more often. PREVENTING INFECTION  Hepatitis B  If you have a higher risk for hepatitis B, you should be screened for this virus. You are considered at high risk for hepatitis B if:  You were born in a country where hepatitis B is common. Ask your health care provider which countries are considered high risk.  Your parents were born in a high-risk country, and you have not been immunized against hepatitis B (hepatitis B vaccine).  You have HIV or AIDS.  You use needles to inject street drugs.  You live with someone who has hepatitis B.  You have had sex with someone who has hepatitis B.  You get hemodialysis treatment.  You take certain medicines for conditions, including cancer, organ transplantation, and autoimmune conditions. Hepatitis C  Blood testing is recommended for:  Everyone born from [birth year] through 1963.  Anyone with known risk factors for hepatitis C. Sexually transmitted infections (STIs)  You should be screened for sexually transmitted infections (STIs) including gonorrhea and chlamydia if:  You are sexually active and are younger than 66 years of age.  You are older than 66 years of age and your health care provider tells you that you are at risk for this type of infection.  Your sexual activity has changed since you were last screened and you are at an increased risk for chlamydia or gonorrhea. Ask your health care provider if you are at risk.  If you do not have HIV, but are at risk, it may be recommended that you take a prescription medicine daily to prevent HIV infection. This is called pre-exposure prophylaxis (PrEP). You are considered at risk if:  You are sexually active and do not regularly use condoms or know the HIV status of your partner(s).  You take drugs by injection.  You are sexually active with a partner who has HIV. Talk with your health care provider about whether you are at high risk of  being infected with HIV. If you choose to begin PrEP, you should first be tested for HIV. You should then be tested every 3 months for as long as you are taking PrEP.  PREGNANCY   If you are premenopausal and you may become pregnant, ask your health care provider about preconception counseling.  If you may become pregnant,  take 400 to 800 micrograms (mcg) of folic acid every day.  If you want to prevent pregnancy, talk to your health care provider about birth control (contraception). OSTEOPOROSIS AND MENOPAUSE   Osteoporosis is a disease in which the bones lose minerals and strength with aging. This can result in serious bone fractures. Your risk for osteoporosis can be identified using a bone density scan.  If you are 39 years of age or older, or if you are at risk for osteoporosis and fractures, ask your health care provider if you should be screened.  Ask your health care provider whether you should take a calcium or vitamin D supplement to lower your risk for osteoporosis.  Menopause may have certain physical symptoms and risks.  Hormone replacement therapy may reduce some of these symptoms and risks. Talk to your health care provider about whether hormone replacement therapy is right for you.  HOME CARE INSTRUCTIONS   Schedule regular health, dental, and eye exams.  Stay current with your immunizations.   Do not use any tobacco products including cigarettes, chewing tobacco, or electronic cigarettes.  If you are pregnant, do not drink alcohol.  If you are breastfeeding, limit how much and how often you drink alcohol.  Limit alcohol intake to no more than 1 drink per day for nonpregnant women. One drink equals 12 ounces of beer, 5 ounces of wine, or 1 ounces of hard liquor.  Do not use street drugs.  Do not share needles.  Ask your health care provider for help if you need support or information about quitting drugs.  Tell your health care provider if you often feel  depressed.  Tell your health care provider if you have ever been abused or do not feel safe at home. Document Released: 12/27/2010 Document Revised: 10/28/2013 Document Reviewed: 05/15/2013 [facility] Patient Information 2015 [place], [place]. This information is not intended to replace advice given to you by your health care provider. Make sure you discuss any questions you have with your health care provider.

## 2014-04-16 NOTE — Progress Notes (Signed)
Subjective:    Patient ID: Sheryl [name], female    DOB: 1946-03-22, 68 y.o.   MRN: [id number]  HPI 18 -year-old patient who is seen today for a preventive health examination.  She is being followed locally by rheumatology and also at [facility] due to neurosarcoidosis. She has had a recent MRI after a recent fall. She has been referred to neurosurgery. She does complain of some occasional neck and shoulder discomfort but really has more issues with low back pain.  She has a history of cervical and lumbar spinal stenosis and is status post laminectomy and fusions.  She has a history of dyslipidemia and treated hypertension. She has stable CLL, which has been partially treated with recent Cytoxan therapy and also a history of osteoarthritis;  additionally she has obesity. She has a history of right optic neuropathy. She continues to be followed by multiple consultants. She has a history of steroid myopathy and her present dose is 5 mg with further plans to taper.  Patient has received influenza and pneumonia vaccinations today.  Zoster vaccination not recommended  Current Allergies:  No known allergies   Past Medical History:   Hyperlipidemia  Hypertension  Lymphocytic leukemia  CNS sarcoid w/secondary gait disturbance, partial deafness  Fibromyalgia  Obesity  DJD   Past Surgical History:   Cholecystectomy  Tonsillectomy  colonoscopy June 2006   Family History:   father died age 66. History of COPD, dementia  mother history of hypertension , osteoarthritis  One sister in good health   Social History:  And a Married  Regular exercise-no  no children   1. Risk factors, based on past  M,S,F history-  cardiovascular risk factors include hypertension and dyslipidemia  2.  Physical activities: Very sedentary do to obesity and osteoarthritis  3.  Depression/mood: No history depression or mood disorder  4.  Hearing: No deficits  5.  ADL's: Require some assistance  with all aspects of daily living do to gait instability  6.  Fall risk: High due to gait instability  7.  Home safety: No problems identified  8.  Height weight, and visual acuity; decreased visual acuity on the right do to optic neuropathy  9.  Counseling: Weight loss encouraged heart healthy diet encouraged  10. Lab orders based on risk factors: Will check a lipid profile  11. Referral : Followup [facility]surgery and locally with rheumatology  12. Care plan: Modest weight loss encouraged  13. Cognitive assessment: Alert and oriented with normal affect. No cognitive dysfunction  14.  preventive services will include annual health examinations, with periodic mammograms.  She is followed closely by rheumatology and ophthalmology. Patient was provided with a written and personalized care plan Influenza and Pneumovax administered We'll schedule bone density study  15.  Provider list includes ophthalmology, neurology, rheumatology, and primary care; also, hematology  GI and radiology  Past Medical History  Diagnosis Date  . 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 DEGENERATIVE JOINT DISEASE 12/14/2006  . EXOGENOUS OBESITY 12/14/2006  . HYPERLIPIDEMIA 12/14/2006  . HYPERTENSION 12/14/2006  . Neurosarcoidosis   . FIBROMYALGIA 12/14/2006  . Sleep apnea   . Hearing loss of left ear   . Vision loss of right eye   . IBS (irritable bowel syndrome)     History   Social History  . Marital Status: Married    Spouse Name: N/A    Number of Children: 0  . Years of Education: hs   Occupational History  . disabled   .  clerical    Social History Main Topics  . Smoking status: Never Smoker   . Smokeless tobacco: Never Used  . Alcohol Use: No     Comment: One drink per year  . Drug Use: No  . Sexual Activity: Not on file   Other Topics Concern  . Not on file   Social History Narrative  . No narrative on file    Past Surgical History  Procedure Laterality Date  . Cholecystectomy     . Tonsillectomy    . Spinal fusion  07/25/2012  . Brain biopsy      [facility]    Family History  Problem Relation Age of Onset  . Diabetes Sister   . Hypertension Mother   . Emphysema Father     Allergies  Allergen Reactions  . Adhesive [Tape] Rash    Current Outpatient Prescriptions on File Prior to Visit  Medication Sig Dispense Refill  . aspirin 81 MG tablet Take 81 mg by mouth daily.        Marland Kitchen atorvastatin (LIPITOR) 40 MG tablet Take 1 tablet (40 mg total) by mouth daily.  90 tablet  3  . cetirizine (ZYRTEC) 10 MG tablet Take 10 mg by mouth daily.        . Cholecalciferol (D3-1000) 1000 UNITS capsule Take 2,000 Units by mouth daily.       . cyclobenzaprine (FLEXERIL) 10 MG tablet Take 1 tablet (10 mg total) by mouth 3 (three) times daily as needed.  60 tablet  3  . fluticasone (FLONASE) 50 MCG/ACT nasal spray Place 2 sprays into the nose daily. OTC      . furosemide (LASIX) 20 MG tablet Take 1 tablet (20 mg total) by mouth daily.  30 tablet  3  . lisinopril (PRINIVIL,ZESTRIL) 40 MG tablet TAKE ONE TABLET BY MOUTH ONCE DAILY  90 tablet  3  . Multiple Vitamin (MULTIVITAMIN) tablet Take 1 tablet by mouth daily.        Marland Kitchen omeprazole (PRILOSEC) 20 MG capsule Take 1 capsule (20 mg total) by mouth daily.  90 capsule  3  . predniSONE (DELTASONE) 20 MG tablet Take 5 mg by mouth daily with breakfast.      . sulfamethoxazole-trimethoprim (BACTRIM DS) 800-160 MG per tablet Take 1 tablet by mouth 3 (three) times a week.  45 tablet  3  . traMADol (ULTRAM) 50 MG tablet Take 1 tablet (50 mg total) by mouth every 6 (six) hours as needed.  90 tablet  4  . ondansetron (ZOFRAN) 8 MG tablet Take 8 mg by mouth as needed.        No current facility-administered medications on file prior to visit.    BP 130/80  Pulse 104  Temp(Src) 97.4 F (36.3 C) (Oral)  Resp 20  Ht 5' (1.524 m)  Wt 186 lb (84.369 kg)  BMI 36.33 kg/m2  SpO2 98%       Review of Systems  Constitutional: Negative for  fever, appetite change, fatigue and unexpected weight change.  HENT: Negative for congestion, dental problem, ear pain, hearing loss, mouth sores, nosebleeds, sinus pressure, sore throat, tinnitus, trouble swallowing and voice change.   Eyes: Positive for visual disturbance. Negative for photophobia, pain and redness.  Respiratory: Negative for cough, chest tightness and shortness of breath.   Cardiovascular: Negative for chest pain, palpitations and leg swelling.  Gastrointestinal: Negative for nausea, vomiting, abdominal pain, diarrhea, constipation, blood in stool, abdominal distention and rectal pain.  Genitourinary: Negative for dysuria,  urgency, frequency, hematuria, flank pain, vaginal bleeding, vaginal discharge, difficulty urinating, genital sores, vaginal pain, menstrual problem and pelvic pain.  Musculoskeletal: Positive for arthralgias, back pain and gait problem. Negative for neck stiffness.  Skin: Negative for rash.  Neurological: Negative for dizziness, syncope, speech difficulty, weakness, light-headedness, numbness and headaches.  Hematological: Negative for adenopathy. Does not bruise/bleed easily.  Psychiatric/Behavioral: Negative for suicidal ideas, behavioral problems, self-injury, dysphoric mood and agitation. The patient is not nervous/anxious.        Objective:   Physical Exam  Constitutional: She is oriented to person, place, and time. She appears well-developed and well-nourished.  HENT:  Head: Normocephalic and atraumatic.  Right Ear: External ear normal.  Left Ear: External ear normal.  Mouth/Throat: Oropharynx is clear and moist.  Minimal cerumen in both canals  Pharyngeal crowding with low hanging soft palate  Eyes: Conjunctivae and EOM are normal.  Mild anisocoria  with the right pupil dilated and only weakly reactive Light perception only.  Right eye  Neck: Normal range of motion. Neck supple. No JVD present. No thyromegaly present.  Cardiovascular:  Normal rate, regular rhythm, normal heart sounds and intact distal pulses.   No murmur heard. Pulmonary/Chest: Effort normal and breath sounds normal. She has no wheezes. She has no rales.  Abdominal: Soft. Bowel sounds are normal. She exhibits no distension and no mass. There is no tenderness. There is no rebound and no guarding.  Genitourinary: Vagina normal.  Musculoskeletal: Normal range of motion. She exhibits no edema and no tenderness.  Generalized muscle weakness.  Unable to stand from a sitting position unassisted  Neurological: She is alert and oriented to person, place, and time. She displays abnormal reflex. No cranial nerve deficit. She exhibits normal muscle tone. Coordination normal.  Reflexes were hyperreflexic with ankle clonus  Skin: Skin is warm and dry. No rash noted.  Psychiatric: She has a normal mood and affect. Her behavior is normal.          Assessment & Plan:   Preventive health examination  history of cervical and lumbar spinal stenosis.  Status post laminectomies and fusion  Fibromyalgia History of neurosarcoidosis Hypertension stable Dyslipidemia. We'll check a lipid profile   Optic neuropathy, right eye Steroid myopathy.  Schedule bone density study.  Followup rheumatology CLL.  Partially treated following Cytoxan therapy with resolution of leukocytosis presently    Recheck 6 months

## 2014-04-17 ENCOUNTER — Telehealth: Payer: Self-pay | Admitting: Internal Medicine

## 2014-04-17 DIAGNOSIS — H469 Unspecified optic neuritis: Secondary | ICD-10-CM | POA: Diagnosis not present

## 2014-04-17 DIAGNOSIS — D8689 Sarcoidosis of other sites: Secondary | ICD-10-CM | POA: Diagnosis not present

## 2014-04-17 NOTE — Telephone Encounter (Signed)
emmi emailed °

## 2014-04-21 ENCOUNTER — Ambulatory Visit (INDEPENDENT_AMBULATORY_CARE_PROVIDER_SITE_OTHER)
Admission: RE | Admit: 2014-04-21 | Discharge: 2014-04-21 | Disposition: A | Discharge status: Home / Self Care | Payer: Medicare Other | Source: Ambulatory Visit | Attending: Internal Medicine | Admitting: Internal Medicine

## 2014-04-21 DIAGNOSIS — M810 Age-related osteoporosis without current pathological fracture: Secondary | ICD-10-CM

## 2014-04-29 ENCOUNTER — Ambulatory Visit (INDEPENDENT_AMBULATORY_CARE_PROVIDER_SITE_OTHER): Payer: Medicare Other | Admitting: Diagnostic Neuroimaging

## 2014-04-29 ENCOUNTER — Encounter: Payer: Self-pay | Admitting: Diagnostic Neuroimaging

## 2014-04-29 VITALS — BP 138/78 | HR 92 | Temp 97.2°F | Ht 60.0 in | Wt 185.6 lb

## 2014-04-29 DIAGNOSIS — H468 Other optic neuritis: Secondary | ICD-10-CM

## 2014-04-29 DIAGNOSIS — H469 Unspecified optic neuritis: Secondary | ICD-10-CM

## 2014-04-29 DIAGNOSIS — D8689 Sarcoidosis of other sites: Secondary | ICD-10-CM | POA: Diagnosis not present

## 2014-04-29 NOTE — Patient Instructions (Signed)
Continue current medications and plan.

## 2014-04-29 NOTE — Progress Notes (Signed)
[facility]  PATIENT: Sheryl [name] DOB: 11/17/1945  REFERRING CLINICIAN: Ni Gorsuch HISTORY FROM: patient and husband  REASON FOR VISIT: new consult    HISTORICAL  CHIEF COMPLAINT:  Chief Complaint  Patient presents with  . Follow-up    HISTORY OF PRESENT ILLNESS:   UPDATE 04/29/14: Since last visit, doing well. Stable. Vision stable. Prednisone is being tapered slowly per [facility] center.   PRIOR HPI (03/28/14): 68 year old right-handed female with history of stage 0 CLL, neurosarcoidosis, bilateral optic neuropathy, hypertension, hypercholesterolemia, sleep apnea, fibromyalgia, here for evaluation of neurosarcoidosis. 1999 patient developed confusion, weight loss, blurred vision, and was evaluated by multiple physicians, clinics, hospitals. Ultimately she went to [facility], had CSF testing, brain biopsy, and was diagnosed with neurosarcoidosis. 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 Patient was started on prednisone and cyclosporine. She was managed by neurology and ophthalmology over many years. At 2002 she was tapered off of prednisone and cyclosporine. Her symptoms are stable for many years. She had followup eye examinations every 6 months. May 2015 patient had new onset of left eye vision problems. Patient went to her ophthalmologist at [facility], had MRI of the orbits, which showed new enhancement in the left optic nerve. Patient was admitted to the hospital for IV Solu-Medrol. She was then transitioned to high-dose prednisone and cyclophosphamide. Prednisone was gradually tapered. She was intolerant of cyclophosphamide due to nausea, anorexia, weakness, hair loss. Patient stopped cyclophosphamide in July 2015 and side effects improved. She was encouraged to restart in August 2015, but stopped it within a few weeks. Also of note patient and husband are somewhat frustrated due to not hearing back from [facility] ophthalmology and neurology clinic, and they're unsure of what the plan is next. Patient's local oncologist also advised patient to establish with a local neurologist to facilitate care.    REVIEW OF SYSTEMS: Full 14 system review of systems performed and notable only for loss loss of vision env allergies.   ALLERGIES: Allergies  Allergen Reactions  . Adhesive [Tape] Rash    HOME MEDICATIONS: Outpatient Prescriptions Prior to Visit  Medication Sig Dispense Refill  . aspirin 81 MG tablet Take 81 mg by mouth daily.      Marland Kitchen atorvastatin (LIPITOR) 40 MG tablet Take 1 tablet (40 mg total) by mouth daily. 90 tablet 3  . cetirizine (ZYRTEC) 10 MG tablet Take 10 mg by mouth daily.      . Cholecalciferol (D3-1000) 1000 UNITS capsule Take 2,000 Units by mouth daily.     . cyclobenzaprine (FLEXERIL) 10 MG tablet Take 1 tablet (10 mg total) by mouth 3 (three) times daily as needed. 60 tablet 3  . fluticasone (FLONASE) 50 MCG/ACT nasal spray Place 2 sprays into the nose daily. OTC    . lisinopril (PRINIVIL,ZESTRIL) 40 MG tablet TAKE ONE TABLET BY MOUTH ONCE DAILY 90 tablet 3  . omeprazole (PRILOSEC) 20 MG capsule Take 1 capsule (20 mg total) by mouth daily. 90 capsule 3  . sulfamethoxazole-trimethoprim (BACTRIM DS) 800-160 MG per tablet Take 1 tablet by mouth 3 (three) times a week. 45 tablet 3  . traMADol (ULTRAM) 50 MG tablet Take 1 tablet (50 mg total) by mouth every 6 (six) hours as needed. 90 tablet 4  . Multiple Vitamin (MULTIVITAMIN) tablet Take 1 tablet by mouth daily.      . ondansetron (ZOFRAN) 8 MG tablet Take 8 mg by mouth as needed.     . furosemide (LASIX) 20  MG tablet Take 1 tablet (20 mg total) by mouth daily. 30 tablet 3  . predniSONE (DELTASONE) 20 MG tablet Take 5 mg by mouth daily with breakfast.     No facility-administered medications prior to visit.    PAST MEDICAL HISTORY: Past Medical History  Diagnosis Date  . 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 DEGENERATIVE JOINT DISEASE  12/14/2006  . EXOGENOUS OBESITY 12/14/2006  . HYPERLIPIDEMIA 12/14/2006  . HYPERTENSION 12/14/2006  . Neurosarcoidosis   . FIBROMYALGIA 12/14/2006  . Sleep apnea   . Hearing loss of left ear   . Vision loss of right eye   . IBS (irritable bowel syndrome)     PAST SURGICAL HISTORY: Past Surgical History  Procedure Laterality Date  . Cholecystectomy    . Tonsillectomy    . Spinal fusion  07/25/2012  . Brain biopsy      [facility]    FAMILY HISTORY: Family History  Problem Relation Age of Onset  . Diabetes Sister   . Hypertension Mother   . Emphysema Father     SOCIAL HISTORY:  History   Social History  . Marital Status: Married    Spouse Name: N/A    Number of Children: 0  . Years of Education: hs   Occupational History  . disabled   . clerical    Social History Main Topics  . Smoking status: Never Smoker   . Smokeless tobacco: Never Used  . Alcohol Use: No     Comment: One drink per year  . Drug Use: No  . Sexual Activity: Not on file   Other Topics Concern  . Not on file   Social History Narrative     PHYSICAL EXAM  Filed Vitals:   04/29/14 1301  BP: 138/78  Pulse: 92  Temp: 97.2 F (36.2 C)  TempSrc: Oral  Height: 5' (1.524 m)  Weight: 185 lb 9.6 oz (84.188 kg)    Not recorded      Body mass index is 36.25 kg/(m^2).  GENERAL EXAM: Patient is in no distress; well developed, nourished and groomed; neck is supple  CARDIOVASCULAR: Regular rate and rhythm, no murmurs, no carotid bruits  NEUROLOGIC: MENTAL STATUS: awake, alert, language fluent, comprehension intact, naming intact, fund of knowledge appropriate CRANIAL NERVE: no papilledema on fundoscopic exam, RIGHT EYE RAPD; LEFT EYE REACTIVE TO LIGHT. ABLE TO COUNT FINGERS IN LEFT EYE. LIGHT PERCEPTION AND MOTION PERCEPTION IN RIGHT EYE. Extraocular muscles intact, no nystagmus, facial sensation and strength symmetric, HEARING DECR, palate elevates symmetrically, uvula midline, shoulder  shrug symmetric, tongue midline. MOTOR: normal bulk and tone, full strength in the BUE; BLE (HF 4+, KE 5, KF 4, DF 5) SENSORY: normal and symmetric to light touch COORDINATION: finger-nose-finger, fine finger movements normal REFLEXES: BUE 1, BLE (KNEES 3, ANKLES 3) GAIT/STATION: IN WHEELCHAIR;     DIAGNOSTIC DATA (LABS, IMAGING, TESTING) - I reviewed patient records, labs, notes, testing and imaging myself where available.  Lab Results  Component Value Date   WBC 6.0 03/26/2014   HGB 12.0 03/26/2014   HCT 37.7 03/26/2014   MCV 98.2 03/26/2014   PLT 156 03/26/2014      Component Value Date/Time   NA 144 03/26/2014 0803   NA 147* 02/21/2014 1157   K 3.9 03/26/2014 0803   K 3.2* 02/21/2014 1157   CL 107 02/21/2014 1157   CL 105 08/24/2012 1202   CO2 27 03/26/2014 0803   CO2 29 02/21/2014 1157   GLUCOSE 88 03/26/2014 0803  GLUCOSE 106* 02/21/2014 1157   GLUCOSE 110* 08/24/2012 1202   GLUCOSE 88 05/29/2006 1020   BUN 15.7 03/26/2014 0803   BUN 11 02/21/2014 1157   CREATININE 0.9 03/26/2014 0803   CREATININE 0.9 02/21/2014 1157   CALCIUM 9.4 03/26/2014 0803   CALCIUM 8.9 02/21/2014 1157   PROT 6.1* 03/26/2014 0803   PROT 5.9* 02/10/2012 1051   ALBUMIN 3.6 03/26/2014 0803   ALBUMIN 3.7 02/10/2012 1051   AST 13 03/26/2014 0803   AST 19 02/10/2012 1051   ALT 12 03/26/2014 0803   ALT 29 02/10/2012 1051   ALKPHOS 87 03/26/2014 0803   ALKPHOS 100 02/10/2012 1051   BILITOT 0.32 03/26/2014 0803   BILITOT 0.3 02/10/2012 1051   GFRNONAA 69* 02/16/2014 1020   GFRAA 80* 02/16/2014 1020   Lab Results  Component Value Date   CHOL 242* 04/17/2012   HDL 48.40 04/17/2012   LDLDIRECT 164.3 04/17/2012   TRIG 204.0* 04/17/2012   CHOLHDL 5 04/17/2012   Lab Results  Component Value Date   HGBA1C 5.7 04/17/2012   No results found for: [id number] Lab Results  Component Value Date   TSH 1.50 12/10/2007    11/06/13 MRI ORBITS ([facility]; report only via care everywhere) 1.  Findings indicative of left optic neuritis. This is new from prior. 2. T2 hyperintensity without enhancement in the intraorbital right optic nerve, which may have been present on the prior but was more difficult to see due to motion. This may be related to previous optic neuritis. 3. Stable right temporal resection cavity.  05/05/13 MRI cervical spine (I reviewed images myself and agree with interpretation. -VRP) 1. Moderate central canal stenosis at C5-6 secondary to abroad-based disc osteophyte complex. 2. Mild to moderate central canal stenosis at C6-7. 3. Mild to moderate left central canal stenosis at C4-5. 4. Mild central and left foraminal stenosis at C3-4. 5. Moderate right foraminal stenosis at C4-5. 6. Mild to moderate foraminal stenosis bilaterally at C5-6. 7. Moderate left and mild right foraminal stenosis at C6-7.  07/20/12 MRI lumbar (I reviewed images myself and agree with interpretation. -VRP) 1. Severe spinal stenosis at L4-5 with grade 1 spondylolisthesis. Nerve roots are trapped at this level. The thecal sac is almost obliterated. Severe right foraminal stenosis at L4-5. 2. Far lateral disc protrusion at L3-4 to the right with mass effect upon the L3 nerve lateral to the neural foramen.  3. Small focal disc protrusion at L1-2 into the left lateral recess which could affect the left L2 nerve. 4. Mild to moderate spinal stenosis at L2-3.    ASSESSMENT AND PLAN  68 y.o. year old female here with long history of neurosarcoidosis, mainly with bilateral optic neuropathy sequelae, also with cervical and lumbar spinal stenosis status post cervical and lumbar decompression and fusion surgeries. Advised patient to continue followup with [facility] ophthalmology and neurology regarding neurosarcoidosis management. Once decision is made about long-term immunosuppression, we can facilitate local followup. Also patient having diffuse weakness, balance difficulty, likely related to  sequelae from cervical and lumbar spinal stenosis.  PLAN: - continue follow up with [facility] ophthalmology and neurology until decision is made re: long term immunosuppression - continue ortho follow up of cervical and lumbar spine spinal stenosis   Return in about 3 months (around 07/30/2014).    Penni Bombard, MD 80/02/9832, 8:25 PM Certified in Neurology, Neurophysiology and Neuroimaging  [facility] [address] [place], [place] [address] [phone]

## 2014-05-01 DIAGNOSIS — M5416 Radiculopathy, lumbar region: Secondary | ICD-10-CM | POA: Diagnosis not present

## 2014-05-01 DIAGNOSIS — M545 Low back pain: Secondary | ICD-10-CM | POA: Diagnosis not present

## 2014-05-01 DIAGNOSIS — M5136 Other intervertebral disc degeneration, lumbar region: Secondary | ICD-10-CM | POA: Diagnosis not present

## 2014-05-01 DIAGNOSIS — M4716 Other spondylosis with myelopathy, lumbar region: Secondary | ICD-10-CM | POA: Diagnosis not present

## 2014-05-02 ENCOUNTER — Telehealth: Payer: Self-pay | Admitting: *Deleted

## 2014-05-02 NOTE — Telephone Encounter (Signed)
Received VM from [place] at [facility] needing orders from Dr. Alvy Bimler faxed over to them for Physical Therapy.  Called them back and left VM for them to return my call.  I cannot find any referral from Dr. Alvy Bimler for PHT.

## 2014-05-06 ENCOUNTER — Telehealth: Payer: Self-pay | Admitting: *Deleted

## 2014-05-06 NOTE — Telephone Encounter (Signed)
Faxed order for PT eval to [facility] at fax [phone].  Notified Husband of order faxed.

## 2014-05-06 NOTE — Telephone Encounter (Signed)
Received Vm from [facility] and from pt's husband.  They are requesting order for Physical Therapy from Dr. Alvy Bimler.

## 2014-05-06 NOTE — Telephone Encounter (Signed)
Please refer

## 2014-05-16 DIAGNOSIS — R2689 Other abnormalities of gait and mobility: Secondary | ICD-10-CM | POA: Diagnosis not present

## 2014-05-16 DIAGNOSIS — M545 Low back pain: Secondary | ICD-10-CM | POA: Diagnosis not present

## 2014-05-21 DIAGNOSIS — M545 Low back pain: Secondary | ICD-10-CM | POA: Diagnosis not present

## 2014-05-21 DIAGNOSIS — R2689 Other abnormalities of gait and mobility: Secondary | ICD-10-CM | POA: Diagnosis not present

## 2014-05-26 DIAGNOSIS — R2689 Other abnormalities of gait and mobility: Secondary | ICD-10-CM | POA: Diagnosis not present

## 2014-05-26 DIAGNOSIS — M545 Low back pain: Secondary | ICD-10-CM | POA: Diagnosis not present

## 2014-05-28 DIAGNOSIS — D8689 Sarcoidosis of other sites: Secondary | ICD-10-CM | POA: Diagnosis not present

## 2014-05-28 DIAGNOSIS — H40052 Ocular hypertension, left eye: Secondary | ICD-10-CM | POA: Diagnosis not present

## 2014-05-28 DIAGNOSIS — H469 Unspecified optic neuritis: Secondary | ICD-10-CM | POA: Diagnosis not present

## 2014-05-30 DIAGNOSIS — R2689 Other abnormalities of gait and mobility: Secondary | ICD-10-CM | POA: Diagnosis not present

## 2014-05-30 DIAGNOSIS — M545 Low back pain: Secondary | ICD-10-CM | POA: Diagnosis not present

## 2014-06-02 DIAGNOSIS — M545 Low back pain: Secondary | ICD-10-CM | POA: Diagnosis not present

## 2014-06-02 DIAGNOSIS — R2689 Other abnormalities of gait and mobility: Secondary | ICD-10-CM | POA: Diagnosis not present

## 2014-06-05 DIAGNOSIS — M545 Low back pain: Secondary | ICD-10-CM | POA: Diagnosis not present

## 2014-06-05 DIAGNOSIS — R2689 Other abnormalities of gait and mobility: Secondary | ICD-10-CM | POA: Diagnosis not present

## 2014-06-09 DIAGNOSIS — R2689 Other abnormalities of gait and mobility: Secondary | ICD-10-CM | POA: Diagnosis not present

## 2014-06-09 DIAGNOSIS — M545 Low back pain: Secondary | ICD-10-CM | POA: Diagnosis not present

## 2014-06-12 DIAGNOSIS — R2689 Other abnormalities of gait and mobility: Secondary | ICD-10-CM | POA: Diagnosis not present

## 2014-06-12 DIAGNOSIS — M545 Low back pain: Secondary | ICD-10-CM | POA: Diagnosis not present

## 2014-06-16 DIAGNOSIS — R2689 Other abnormalities of gait and mobility: Secondary | ICD-10-CM | POA: Diagnosis not present

## 2014-06-16 DIAGNOSIS — M545 Low back pain: Secondary | ICD-10-CM | POA: Diagnosis not present

## 2014-06-18 DIAGNOSIS — M545 Low back pain: Secondary | ICD-10-CM | POA: Diagnosis not present

## 2014-06-18 DIAGNOSIS — R2689 Other abnormalities of gait and mobility: Secondary | ICD-10-CM | POA: Diagnosis not present

## 2014-06-23 DIAGNOSIS — R2689 Other abnormalities of gait and mobility: Secondary | ICD-10-CM | POA: Diagnosis not present

## 2014-06-23 DIAGNOSIS — M545 Low back pain: Secondary | ICD-10-CM | POA: Diagnosis not present

## 2014-07-02 DIAGNOSIS — M542 Cervicalgia: Secondary | ICD-10-CM | POA: Diagnosis not present

## 2014-07-02 DIAGNOSIS — M5136 Other intervertebral disc degeneration, lumbar region: Secondary | ICD-10-CM | POA: Diagnosis not present

## 2014-07-02 DIAGNOSIS — R2689 Other abnormalities of gait and mobility: Secondary | ICD-10-CM | POA: Diagnosis not present

## 2014-07-02 DIAGNOSIS — M545 Low back pain: Secondary | ICD-10-CM | POA: Diagnosis not present

## 2014-07-09 DIAGNOSIS — R2689 Other abnormalities of gait and mobility: Secondary | ICD-10-CM | POA: Diagnosis not present

## 2014-07-09 DIAGNOSIS — M545 Low back pain: Secondary | ICD-10-CM | POA: Diagnosis not present

## 2014-07-16 DIAGNOSIS — M545 Low back pain: Secondary | ICD-10-CM | POA: Diagnosis not present

## 2014-07-16 DIAGNOSIS — R2689 Other abnormalities of gait and mobility: Secondary | ICD-10-CM | POA: Diagnosis not present

## 2014-07-21 ENCOUNTER — Other Ambulatory Visit: Payer: Self-pay | Admitting: Internal Medicine

## 2014-07-23 DIAGNOSIS — R2689 Other abnormalities of gait and mobility: Secondary | ICD-10-CM | POA: Diagnosis not present

## 2014-07-23 DIAGNOSIS — M545 Low back pain: Secondary | ICD-10-CM | POA: Diagnosis not present

## 2014-07-29 DIAGNOSIS — H469 Unspecified optic neuritis: Secondary | ICD-10-CM | POA: Diagnosis not present

## 2014-07-29 DIAGNOSIS — D8689 Sarcoidosis of other sites: Secondary | ICD-10-CM | POA: Diagnosis not present

## 2014-07-29 DIAGNOSIS — H40052 Ocular hypertension, left eye: Secondary | ICD-10-CM | POA: Diagnosis not present

## 2014-07-30 ENCOUNTER — Encounter: Payer: Self-pay | Admitting: Diagnostic Neuroimaging

## 2014-07-30 ENCOUNTER — Ambulatory Visit (INDEPENDENT_AMBULATORY_CARE_PROVIDER_SITE_OTHER): Payer: Medicare Other | Admitting: Diagnostic Neuroimaging

## 2014-07-30 VITALS — BP 136/68 | HR 93 | Ht 60.0 in | Wt 189.0 lb

## 2014-07-30 DIAGNOSIS — M545 Low back pain: Secondary | ICD-10-CM | POA: Diagnosis not present

## 2014-07-30 DIAGNOSIS — R2689 Other abnormalities of gait and mobility: Secondary | ICD-10-CM | POA: Diagnosis not present

## 2014-07-30 DIAGNOSIS — D8689 Sarcoidosis of other sites: Secondary | ICD-10-CM

## 2014-07-30 NOTE — Patient Instructions (Signed)
Follow up with [facility].

## 2014-07-30 NOTE — Progress Notes (Signed)
[facility]  PATIENT: Sheryl [name] DOB: [birth year]  REFERRING CLINICIAN: Ni Gorsuch HISTORY FROM: patient and husband  REASON FOR VISIT: follow up   HISTORICAL  CHIEF COMPLAINT:  Chief Complaint  Patient presents with  . Follow-up    HISTORY OF PRESENT ILLNESS:   UPDATE 07/30/14: Doing well. Has been through PT and now using a walker. Continues follow up with Dr. Emilio Math [facility]). Dr. Rosalita Chessman is rheumatology who has managed her immunosuppression also.   UPDATE 04/29/14: Since last visit, doing well. Stable. Vision stable. Prednisone is being tapered slowly per [facility] center.   PRIOR HPI (03/28/14): 69 year old right-handed female with history of stage 0 CLL, neurosarcoidosis, bilateral optic neuropathy, hypertension, hypercholesterolemia, sleep apnea, fibromyalgia, here for evaluation of neurosarcoidosis. 1999 patient developed confusion, weight loss, blurred vision, and was evaluated by multiple physicians, clinics, hospitals. Ultimately she went to [facility], had CSF testing, brain biopsy, and was diagnosed with neurosarcoidosis. 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 Patient was started on prednisone and cyclosporine. She was managed by neurology and ophthalmology over many years. At 2002 she was tapered off of prednisone and cyclosporine. Her symptoms are stable for many years. She had followup eye examinations every 6 months. May 2015 patient had new onset of left eye vision problems. Patient went to her ophthalmologist at [facility], had MRI of the orbits, which showed new enhancement in the left optic nerve. Patient was admitted to the hospital for IV Solu-Medrol. She was then transitioned to high-dose prednisone and cyclophosphamide. Prednisone was gradually tapered. She was intolerant of cyclophosphamide due to nausea, anorexia, weakness, hair loss. Patient stopped cyclophosphamide in July 2015 and side effects  improved. She was encouraged to restart in August 2015, but stopped it within a few weeks. Also of note patient and husband are somewhat frustrated due to not hearing back from [facility] ophthalmology and neurology clinic, and they're unsure of what the plan is next. Patient's local oncologist also advised patient to establish with a local neurologist to facilitate care.    REVIEW OF SYSTEMS: Full 14 system review of systems performed and notable only for hearing loss blurred vision restless legs aching muscles cramps daytime sleepiness.  ALLERGIES: Allergies  Allergen Reactions  . Adhesive [Tape] Rash    HOME MEDICATIONS: Outpatient Prescriptions Prior to Visit  Medication Sig Dispense Refill  . aspirin 81 MG tablet Take 81 mg by mouth daily.      Marland Kitchen atorvastatin (LIPITOR) 40 MG tablet Take 1 tablet (40 mg total) by mouth daily. 90 tablet 3  . cetirizine (ZYRTEC) 10 MG tablet Take 10 mg by mouth daily.      . cyclobenzaprine (FLEXERIL) 10 MG tablet Take 1 tablet (10 mg total) by mouth 3 (three) times daily as needed. 60 tablet 3  . fluticasone (FLONASE) 50 MCG/ACT nasal spray Place 2 sprays into the nose daily. OTC    . lisinopril (PRINIVIL,ZESTRIL) 40 MG tablet TAKE ONE TABLET BY MOUTH ONCE DAILY 90 tablet 3  . Multiple Vitamin (MULTIVITAMIN) tablet Take 1 tablet by mouth daily.      Marland Kitchen omeprazole (PRILOSEC) 20 MG capsule Take 1 capsule (20 mg total) by mouth daily. 90 capsule 3  . ondansetron (ZOFRAN) 8 MG tablet Take 8 mg by mouth as needed.     . predniSONE (DELTASONE) 2.5 MG tablet Take 1 tablet by mouth daily.    Marland Kitchen sulfamethoxazole-trimethoprim (BACTRIM DS) 800-160 MG per tablet Take 1 tablet by mouth 3 (three)  times a week. 45 tablet 3  . traMADol (ULTRAM) 50 MG tablet Take 1 tablet (50 mg total) by mouth every 6 (six) hours as needed. 90 tablet 4  . Cholecalciferol (D3-1000) 1000 UNITS capsule Take 2,000 Units by mouth daily.     Marland Kitchen lisinopril (PRINIVIL,ZESTRIL) 40 MG tablet  TAKE ONE TABLET BY MOUTH ONCE DAILY 90 tablet 1   No facility-administered medications prior to visit.    PAST MEDICAL HISTORY: Past Medical History  Diagnosis Date  . 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 DEGENERATIVE JOINT DISEASE 12/14/2006  . EXOGENOUS OBESITY 12/14/2006  . HYPERLIPIDEMIA 12/14/2006  . HYPERTENSION 12/14/2006  . Neurosarcoidosis   . FIBROMYALGIA 12/14/2006  . Sleep apnea   . Hearing loss of left ear   . Vision loss of right eye   . IBS (irritable bowel syndrome)     PAST SURGICAL HISTORY: Past Surgical History  Procedure Laterality Date  . Cholecystectomy    . Tonsillectomy    . Spinal fusion  07/25/2012  . Brain biopsy      [facility]    FAMILY HISTORY: Family History  Problem Relation Age of Onset  . Diabetes Sister   . Hypertension Mother   . Emphysema Father     SOCIAL HISTORY:  History   Social History  . Marital Status: Married    Spouse Name: N/A    Number of Children: 0  . Years of Education: hs   Occupational History  . disabled   . clerical    Social History Main Topics  . Smoking status: Never Smoker   . Smokeless tobacco: Never Used  . Alcohol Use: No     Comment: One drink per year  . Drug Use: No  . Sexual Activity: Not on file   Other Topics Concern  . Not on file   Social History Narrative     PHYSICAL EXAM  Filed Vitals:   07/30/14 1303  BP: 136/68  Pulse: 93  Height: 5' (1.524 m)  Weight: 189 lb (85.73 kg)    Not recorded    No exam data present  Body mass index is 36.91 kg/(m^2).  GENERAL EXAM: Patient is in no distress; well developed, nourished and groomed; neck is supple  CARDIOVASCULAR: Regular rate and rhythm, no murmurs, no carotid bruits  NEUROLOGIC: MENTAL STATUS: awake, alert, language fluent, comprehension intact, naming intact, fund of knowledge appropriate CRANIAL NERVE: no papilledema on fundoscopic exam, RIGHT EYE RAPD; LEFT EYE REACTIVE TO LIGHT. ABLE TO COUNT FINGERS IN LEFT EYE. LIGHT PERCEPTION  AND MOTION PERCEPTION IN RIGHT EYE. Extraocular muscles intact, no nystagmus, facial sensation and strength symmetric, HEARING DECR, palate elevates symmetrically, uvula midline, shoulder shrug symmetric, tongue midline. MOTOR: normal bulk; INCREASED TONE IN BLE; full strength in the BUE; BLE (HF 4, KE 5, KF 4, DF 5) SENSORY: normal and symmetric to light touch COORDINATION: finger-nose-finger, fine finger movements normal REFLEXES: BUE 1, BLE (KNEES 3, ANKLES 3) GAIT/STATION: USES ROLLATOR WALKER    DIAGNOSTIC DATA (LABS, IMAGING, TESTING) - I reviewed patient records, labs, notes, testing and imaging myself where available.  Lab Results  Component Value Date   WBC 6.0 03/26/2014   HGB 12.0 03/26/2014   HCT 37.7 03/26/2014   MCV 98.2 03/26/2014   PLT 156 03/26/2014      Component Value Date/Time   NA 144 03/26/2014 0803   NA 147* 02/21/2014 1157   K 3.9 03/26/2014 0803   K 3.2* 02/21/2014 1157   CL 107 02/21/2014 1157  CL 105 08/24/2012 1202   CO2 27 03/26/2014 0803   CO2 29 02/21/2014 1157   GLUCOSE 88 03/26/2014 0803   GLUCOSE 106* 02/21/2014 1157   GLUCOSE 110* 08/24/2012 1202   GLUCOSE 88 05/29/2006 1020   BUN 15.7 03/26/2014 0803   BUN 11 02/21/2014 1157   CREATININE 0.9 03/26/2014 0803   CREATININE 0.9 02/21/2014 1157   CALCIUM 9.4 03/26/2014 0803   CALCIUM 8.9 02/21/2014 1157   PROT 6.1* 03/26/2014 0803   PROT 5.9* 02/10/2012 1051   ALBUMIN 3.6 03/26/2014 0803   ALBUMIN 3.7 02/10/2012 1051   AST 13 03/26/2014 0803   AST 19 02/10/2012 1051   ALT 12 03/26/2014 0803   ALT 29 02/10/2012 1051   ALKPHOS 87 03/26/2014 0803   ALKPHOS 100 02/10/2012 1051   BILITOT 0.32 03/26/2014 0803   BILITOT 0.3 02/10/2012 1051   GFRNONAA 69* 02/16/2014 1020   GFRAA 80* 02/16/2014 1020   Lab Results  Component Value Date   CHOL 242* 04/17/2012   HDL 48.40 04/17/2012   LDLDIRECT 164.3 04/17/2012   TRIG 204.0* 04/17/2012   CHOLHDL 5 04/17/2012   Lab Results  Component  Value Date   HGBA1C 5.7 04/17/2012   No results found for: [id number] Lab Results  Component Value Date   TSH 1.50 12/10/2007    11/06/13 MRI ORBITS ([facility]; report only via care everywhere) 1. Findings indicative of left optic neuritis. This is new from prior. 2. T2 hyperintensity without enhancement in the intraorbital right optic nerve, which may have been present on the prior but was more difficult to see due to motion. This may be related to previous optic neuritis. 3. Stable right temporal resection cavity.  05/05/13 MRI cervical spine (I reviewed images myself and agree with interpretation. -VRP) 1. Moderate central canal stenosis at C5-6 secondary to abroad-based disc osteophyte complex. 2. Mild to moderate central canal stenosis at C6-7. 3. Mild to moderate left central canal stenosis at C4-5. 4. Mild central and left foraminal stenosis at C3-4. 5. Moderate right foraminal stenosis at C4-5. 6. Mild to moderate foraminal stenosis bilaterally at C5-6. 7. Moderate left and mild right foraminal stenosis at C6-7.  07/20/12 MRI lumbar (I reviewed images myself and agree with interpretation. -VRP) 1. Severe spinal stenosis at L4-5 with grade 1 spondylolisthesis. Nerve roots are trapped at this level. The thecal sac is almost obliterated. Severe right foraminal stenosis at L4-5. 2. Far lateral disc protrusion at L3-4 to the right with mass effect upon the L3 nerve lateral to the neural foramen.  3. Small focal disc protrusion at L1-2 into the left lateral recess which could affect the left L2 nerve. 4. Mild to moderate spinal stenosis at L2-3.  04/07/14 MR THORACIC SPINE - Mild thoracic spondylosis; normal for age. No significant central stenosis. Lower thoracic facet arthrosis.  04/07/14 MR LUMBAR SPINE 1. L4-L5 PLIF. No complicating features. No recurrent stenosis. 2. Progressive L3-L4 adjacent segment disease, with mild central stenosis and moderate to severe RIGHT foraminal  stenosis. RIGHT eccentric disc extrusion extending into the RIGHT neural foramen and producing RIGHT lateral recess stenosis.    ASSESSMENT AND PLAN  69 y.o. year old female here with long history of neurosarcoidosis (dx'd 1995 by brain biopsy), mainly with bilateral optic neuropathy sequelae, also with cervical and lumbar spinal stenosis status post cervical and lumbar decompression and fusion surgeries. Advised patient to continue followup with [facility] ophthalmology and rheumatology regarding neurosarcoidosis management. Patient continues with weakness, balance difficulty, likely related  to sequelae from cervical and lumbar spinal stenosis.  PLAN: - continue follow up with [facility] ophthalmology and rheumatology - continue ortho follow up of cervical and lumbar spine spinal stenosis  - follow up here as needed  Return if symptoms worsen or fail to improve.    Penni Bombard, MD 01/01/3735, 6:81 PM Certified in Neurology, Neurophysiology and Neuroimaging  [facility] [address] [place], [place] [address] [phone]

## 2014-08-06 DIAGNOSIS — R2689 Other abnormalities of gait and mobility: Secondary | ICD-10-CM | POA: Diagnosis not present

## 2014-08-06 DIAGNOSIS — M545 Low back pain: Secondary | ICD-10-CM | POA: Diagnosis not present

## 2014-08-13 DIAGNOSIS — M545 Low back pain: Secondary | ICD-10-CM | POA: Diagnosis not present

## 2014-08-13 DIAGNOSIS — R2689 Other abnormalities of gait and mobility: Secondary | ICD-10-CM | POA: Diagnosis not present

## 2014-08-20 DIAGNOSIS — M545 Low back pain: Secondary | ICD-10-CM | POA: Diagnosis not present

## 2014-08-20 DIAGNOSIS — R2689 Other abnormalities of gait and mobility: Secondary | ICD-10-CM | POA: Diagnosis not present

## 2014-08-29 DIAGNOSIS — R2689 Other abnormalities of gait and mobility: Secondary | ICD-10-CM | POA: Diagnosis not present

## 2014-08-29 DIAGNOSIS — R2681 Unsteadiness on feet: Secondary | ICD-10-CM | POA: Diagnosis not present

## 2014-08-29 DIAGNOSIS — M545 Low back pain: Secondary | ICD-10-CM | POA: Diagnosis not present

## 2014-09-03 DIAGNOSIS — M545 Low back pain: Secondary | ICD-10-CM | POA: Diagnosis not present

## 2014-09-03 DIAGNOSIS — R2689 Other abnormalities of gait and mobility: Secondary | ICD-10-CM | POA: Diagnosis not present

## 2014-09-03 DIAGNOSIS — R2681 Unsteadiness on feet: Secondary | ICD-10-CM | POA: Diagnosis not present

## 2014-09-10 DIAGNOSIS — R2689 Other abnormalities of gait and mobility: Secondary | ICD-10-CM | POA: Diagnosis not present

## 2014-09-10 DIAGNOSIS — R2681 Unsteadiness on feet: Secondary | ICD-10-CM | POA: Diagnosis not present

## 2014-09-10 DIAGNOSIS — M545 Low back pain: Secondary | ICD-10-CM | POA: Diagnosis not present

## 2014-09-24 ENCOUNTER — Telehealth: Payer: Self-pay | Admitting: Hematology and Oncology

## 2014-09-24 ENCOUNTER — Other Ambulatory Visit (HOSPITAL_BASED_OUTPATIENT_CLINIC_OR_DEPARTMENT_OTHER): Payer: Medicare Other

## 2014-09-24 ENCOUNTER — Ambulatory Visit (HOSPITAL_BASED_OUTPATIENT_CLINIC_OR_DEPARTMENT_OTHER): Payer: Medicare Other | Admitting: Hematology and Oncology

## 2014-09-24 ENCOUNTER — Encounter: Payer: Self-pay | Admitting: Hematology and Oncology

## 2014-09-24 VITALS — BP 179/58 | HR 63 | Resp 18 | Ht 60.0 in | Wt 188.8 lb

## 2014-09-24 DIAGNOSIS — D8689 Sarcoidosis of other sites: Secondary | ICD-10-CM | POA: Diagnosis not present

## 2014-09-24 DIAGNOSIS — T380X5A Adverse effect of glucocorticoids and synthetic analogues, initial encounter: Secondary | ICD-10-CM

## 2014-09-24 DIAGNOSIS — C9110 Chronic lymphocytic leukemia of B-cell type not having achieved remission: Secondary | ICD-10-CM

## 2014-09-24 DIAGNOSIS — G7289 Other specified myopathies: Secondary | ICD-10-CM

## 2014-09-24 DIAGNOSIS — G720 Drug-induced myopathy: Secondary | ICD-10-CM

## 2014-09-24 DIAGNOSIS — I10 Essential (primary) hypertension: Secondary | ICD-10-CM | POA: Diagnosis not present

## 2014-09-24 DIAGNOSIS — Z299 Encounter for prophylactic measures, unspecified: Secondary | ICD-10-CM

## 2014-09-24 LAB — COMPREHENSIVE METABOLIC PANEL (CC13)
ALT: 19 U/L (ref 0–55)
AST: 19 U/L (ref 5–34)
Albumin: 4.1 g/dL (ref 3.5–5.0)
Alkaline Phosphatase: 145 U/L (ref 40–150)
Anion Gap: 9 mEq/L (ref 3–11)
BUN: 24.8 mg/dL (ref 7.0–26.0)
CO2: 27 mEq/L (ref 22–29)
Calcium: 10 mg/dL (ref 8.4–10.4)
Chloride: 105 mEq/L (ref 98–109)
Creatinine: 1.1 mg/dL (ref 0.6–1.1)
EGFR: 53 mL/min/{1.73_m2} — ABNORMAL LOW (ref >90)
Glucose: 96 mg/dl (ref 70–140)
Potassium: 4.3 mEq/L (ref 3.5–5.1)
Sodium: 141 mEq/L (ref 136–145)
Total Bilirubin: 0.41 mg/dL (ref 0.20–1.20)
Total Protein: 7.1 g/dL (ref 6.4–8.3)

## 2014-09-24 LAB — CBC WITH DIFFERENTIAL/PLATELET
BASO%: 0.8 % (ref 0.0–2.0)
Basophils Absolute: 0 10*3/uL (ref 0.0–0.1)
EOS%: 2.6 % (ref 0.0–7.0)
Eosinophils Absolute: 0.1 10*3/uL (ref 0.0–0.5)
HCT: 42.7 % (ref 34.8–46.6)
HGB: 13.7 g/dL (ref 11.6–15.9)
LYMPH%: 17.1 % (ref 14.0–49.7)
MCH: 25.8 pg (ref 25.1–34.0)
MCHC: 32 g/dL (ref 31.5–36.0)
MCV: 80.5 fL (ref 79.5–101.0)
MONO#: 0.4 10*3/uL (ref 0.1–0.9)
MONO%: 7.1 % (ref 0.0–14.0)
NEUT#: 3.8 10*3/uL (ref 1.5–6.5)
NEUT%: 72.4 % (ref 38.4–76.8)
Platelets: 205 10*3/uL (ref 145–400)
RBC: 5.3 10*6/uL (ref 3.70–5.45)
RDW: 16.2 % — ABNORMAL HIGH (ref 11.2–14.5)
WBC: 5.2 10*3/uL (ref 3.9–10.3)
lymph#: 0.9 10*3/uL (ref 0.9–3.3)

## 2014-09-24 LAB — LACTATE DEHYDROGENASE (CC13): LDH: 238 U/L (ref 125–245)

## 2014-09-24 NOTE — Telephone Encounter (Signed)
left voicemail for pt regarding to March 2017 appt....mailed pt appt sched/avs and letter

## 2014-09-24 NOTE — Assessment & Plan Note (Addendum)
I discussed with the patient the importance of preventive vaccination programs. She is taking calcium and vitamin D to prevent risk of osteoporosis due to chronic exposure to prednisone. Last year, she had received pneumococcal vaccination and influenza vaccination from her PCP. I recommend against Zoster vaccine. I recommend she resume screening mammogram.

## 2014-09-24 NOTE — Assessment & Plan Note (Signed)
This has improved tremendously with physical therapy. I recommend the patient to continue exercise program.

## 2014-09-24 NOTE — Assessment & Plan Note (Signed)
Clinically, she is doing well. Currently, she is on low-dose maintenance prednisone.

## 2014-09-24 NOTE — Assessment & Plan Note (Signed)
Her prednisone therapy and recent treatment with Cytoxan has partially treated CLL. I recommend visit in 12 months with history, physical examination and blood work.

## 2014-09-24 NOTE — Assessment & Plan Note (Signed)
Her blood pressures are high today. I recommend her to monitors that closely at home and follow-up with PCP for chronic management.

## 2014-09-24 NOTE — Progress Notes (Signed)
[place] OFFICE PROGRESS NOTE  Patient Care Team: Marletta Lor, MD as PCP - General Hurley Cisco, MD as Consulting Physician (Rheumatology) Sundra Aland (Rehabilitation)  SUMMARY OF ONCOLOGIC HISTORY: I reviewed the patient's records extensive and collaborated the history with the patient. Summary of her history is as follows: This patient had diagnosis of stage 0 CLL. Since she was seen here in February, she was diagnosed with neurosarcoidosis at [facility]. She had almost left eye blindness. 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 The patient was placed on high-dose corticosteroid therapy followed by oral cyclophosphamide until August 2015 She is maintained on 5 mg chronic prednisone therapy. She had uncontrolled blood sugar and hypertension while on high-dose prednisone. INTERVAL HISTORY: Please see below for problem oriented charting. She denies new lymphadenopathy. No recent infection. Since I saw her, she was placed on intensive physical therapy with improvement of steroid myopathy. Her prednisone dose was reduced to 2.5 mg daily.  REVIEW OF SYSTEMS:   Constitutional: Denies fevers, chills or abnormal weight loss Eyes: Denies blurriness of vision Ears, nose, mouth, throat, and face: Denies mucositis or sore throat Respiratory: Denies cough, dyspnea or wheezes Cardiovascular: Denies palpitation, chest discomfort or lower extremity swelling Gastrointestinal:  Denies nausea, heartburn or change in bowel habits Skin: Denies abnormal skin rashes Lymphatics: Denies new lymphadenopathy or easy bruising Neurological:Denies numbness, tingling or new weaknesses Behavioral/Psych: Mood is stable, no new changes  All other systems were reviewed with the patient and are negative.  I have reviewed the past medical history, past surgical history, social history and family history with the patient and they are unchanged from previous  note.  ALLERGIES:  is allergic to adhesive and cyclophosphamide.  MEDICATIONS:  Current Outpatient Prescriptions  Medication Sig Dispense Refill  . aspirin 81 MG tablet Take 81 mg by mouth daily.      Marland Kitchen atorvastatin (LIPITOR) 40 MG tablet Take 1 tablet (40 mg total) by mouth daily. 90 tablet 3  . brimonidine (ALPHAGAN) 0.15 % ophthalmic solution     . cetirizine (ZYRTEC) 10 MG tablet Take 10 mg by mouth daily.      . Cholecalciferol (VITAMIN D3) 2000 UNITS TABS Take 1 tablet by mouth daily.    . fluticasone (FLONASE) 50 MCG/ACT nasal spray Place 2 sprays into the nose daily. OTC    . lisinopril (PRINIVIL,ZESTRIL) 40 MG tablet TAKE ONE TABLET BY MOUTH ONCE DAILY 90 tablet 3  . omeprazole (PRILOSEC) 20 MG capsule Take 1 capsule (20 mg total) by mouth daily. 90 capsule 3  . predniSONE (DELTASONE) 2.5 MG tablet Take 1 tablet by mouth daily.    Marland Kitchen sulfamethoxazole-trimethoprim (BACTRIM DS) 800-160 MG per tablet Take 1 tablet by mouth 3 (three) times a week. 45 tablet 3  . cyclobenzaprine (FLEXERIL) 10 MG tablet Take 1 tablet (10 mg total) by mouth 3 (three) times daily as needed. (Patient not taking: Reported on 09/24/2014) 60 tablet 3  . Multiple Vitamin (MULTIVITAMIN) tablet Take 1 tablet by mouth daily.      . ondansetron (ZOFRAN) 8 MG tablet Take 8 mg by mouth as needed.     . traMADol (ULTRAM) 50 MG tablet Take 1 tablet (50 mg total) by mouth every 6 (six) hours as needed. (Patient not taking: Reported on 09/24/2014) 90 tablet 4   No current facility-administered medications for this visit.    PHYSICAL EXAMINATION: ECOG PERFORMANCE STATUS: 0 - Asymptomatic  Filed Vitals:   09/24/14 0837  BP:  179/58  Pulse:   Resp:    Filed Weights   09/24/14 0820  Weight: 188 lb 12.8 oz (85.639 kg)    GENERAL:alert, no distress and comfortable. She appeared obese and cushingoid SKIN: skin color, texture, turgor are normal, no rashes or significant lesions EYES: normal, Conjunctiva are pink and  non-injected, sclera clear OROPHARYNX:no exudate, no erythema and lips, buccal mucosa, and tongue normal  NECK: supple, thyroid normal size, non-tender, without nodularity LYMPH:  no palpable lymphadenopathy in the cervical, axillary or inguinal LUNGS: clear to auscultation and percussion with normal breathing effort HEART: regular rate & rhythm and no murmurs and no lower extremity edema ABDOMEN:abdomen soft, non-tender and normal bowel sounds Musculoskeletal:no cyanosis of digits and no clubbing  NEURO: alert & oriented x 3 with fluent speech, no focal motor/sensory deficits  LABORATORY DATA:  I have reviewed the data as listed    Component Value Date/Time   NA 141 09/24/2014 0804   NA 147* 02/21/2014 1157   K 4.3 09/24/2014 0804   K 3.2* 02/21/2014 1157   CL 107 02/21/2014 1157   CL 105 08/24/2012 1202   CO2 27 09/24/2014 0804   CO2 29 02/21/2014 1157   GLUCOSE 96 09/24/2014 0804   GLUCOSE 106* 02/21/2014 1157   GLUCOSE 110* 08/24/2012 1202   GLUCOSE 88 05/29/2006 1020   BUN 24.8 09/24/2014 0804   BUN 11 02/21/2014 1157   CREATININE 1.1 09/24/2014 0804   CREATININE 0.9 02/21/2014 1157   CALCIUM 10.0 09/24/2014 0804   CALCIUM 8.9 02/21/2014 1157   PROT 7.1 09/24/2014 0804   PROT 5.9* 02/10/2012 1051   ALBUMIN 4.1 09/24/2014 0804   ALBUMIN 3.7 02/10/2012 1051   AST 19 09/24/2014 0804   AST 19 02/10/2012 1051   ALT 19 09/24/2014 0804   ALT 29 02/10/2012 1051   ALKPHOS 145 09/24/2014 0804   ALKPHOS 100 02/10/2012 1051   BILITOT 0.41 09/24/2014 0804   BILITOT 0.3 02/10/2012 1051   GFRNONAA 69* 02/16/2014 1020   GFRAA 80* 02/16/2014 1020    No results found for: SPEP, UPEP  Lab Results  Component Value Date   WBC 5.2 09/24/2014   NEUTROABS 3.8 09/24/2014   HGB 13.7 09/24/2014   HCT 42.7 09/24/2014   MCV 80.5 09/24/2014   PLT 205 09/24/2014      Chemistry      Component Value Date/Time   NA 141 09/24/2014 0804   NA 147* 02/21/2014 1157   K 4.3 09/24/2014  0804   K 3.2* 02/21/2014 1157   CL 107 02/21/2014 1157   CL 105 08/24/2012 1202   CO2 27 09/24/2014 0804   CO2 29 02/21/2014 1157   BUN 24.8 09/24/2014 0804   BUN 11 02/21/2014 1157   CREATININE 1.1 09/24/2014 0804   CREATININE 0.9 02/21/2014 1157      Component Value Date/Time   CALCIUM 10.0 09/24/2014 0804   CALCIUM 8.9 02/21/2014 1157   ALKPHOS 145 09/24/2014 0804   ALKPHOS 100 02/10/2012 1051   AST 19 09/24/2014 0804   AST 19 02/10/2012 1051   ALT 19 09/24/2014 0804   ALT 29 02/10/2012 1051   BILITOT 0.41 09/24/2014 0804   BILITOT 0.3 02/10/2012 1051     ASSESSMENT & PLAN:  Chronic lymphocytic leukemia Her prednisone therapy and recent treatment with Cytoxan has partially treated CLL. I recommend visit in 12 months with history, physical examination and blood work.     Essential hypertension Her blood pressures are high today. I recommend  her to monitors that closely at home and follow-up with PCP for chronic management.   Neurosarcoidosis Clinically, she is doing well. Currently, she is on low-dose maintenance prednisone.     Preventive measure I discussed with the patient the importance of preventive vaccination programs. She is taking calcium and vitamin D to prevent risk of osteoporosis due to chronic exposure to prednisone. Last year, she had received pneumococcal vaccination and influenza vaccination from her PCP. I recommend against Zoster vaccine. I recommend she resume screening mammogram.     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 I recommend the patient to continue exercise program.    Orders Placed This Encounter  Procedures  . CBC with Differential/Platelet    Standing Status: Future     Number of Occurrences:      Standing Expiration Date: 10/29/2015  . Comprehensive metabolic panel    Standing Status: Future     Number of Occurrences:      Standing Expiration Date: 10/29/2015  . Lactate dehydrogenase     Standing Status: Future     Number of Occurrences:      Standing Expiration Date: 10/29/2015   All questions were answered. The patient knows to call the clinic with any problems, questions or concerns. No barriers to learning was detected. I spent 15 minutes counseling the patient face to face. The total time spent in the appointment was 20 minutes and more than 50% was on counseling and review of test results     [facility], Shilo Pauwels, MD 09/24/2014 9:01 AM

## 2014-09-30 DIAGNOSIS — M5416 Radiculopathy, lumbar region: Secondary | ICD-10-CM | POA: Diagnosis not present

## 2014-09-30 DIAGNOSIS — M5136 Other intervertebral disc degeneration, lumbar region: Secondary | ICD-10-CM | POA: Diagnosis not present

## 2014-10-16 ENCOUNTER — Ambulatory Visit: Payer: Medicare Other | Admitting: Internal Medicine

## 2014-10-20 ENCOUNTER — Ambulatory Visit: Payer: Medicare Other | Admitting: Internal Medicine

## 2014-10-31 ENCOUNTER — Ambulatory Visit (INDEPENDENT_AMBULATORY_CARE_PROVIDER_SITE_OTHER): Payer: Medicare Other | Admitting: Internal Medicine

## 2014-10-31 ENCOUNTER — Encounter: Payer: Self-pay | Admitting: Internal Medicine

## 2014-10-31 VITALS — BP 120/74 | HR 56 | Temp 97.8°F | Resp 20 | Ht 60.0 in | Wt 193.0 lb

## 2014-10-31 DIAGNOSIS — E785 Hyperlipidemia, unspecified: Secondary | ICD-10-CM

## 2014-10-31 DIAGNOSIS — Z8719 Personal history of other diseases of the digestive system: Secondary | ICD-10-CM | POA: Insufficient documentation

## 2014-10-31 DIAGNOSIS — C9110 Chronic lymphocytic leukemia of B-cell type not having achieved remission: Secondary | ICD-10-CM | POA: Diagnosis not present

## 2014-10-31 DIAGNOSIS — M159 Polyosteoarthritis, unspecified: Secondary | ICD-10-CM

## 2014-10-31 DIAGNOSIS — M8949 Other hypertrophic osteoarthropathy, multiple sites: Secondary | ICD-10-CM

## 2014-10-31 DIAGNOSIS — M150 Primary generalized (osteo)arthritis: Secondary | ICD-10-CM | POA: Diagnosis not present

## 2014-10-31 HISTORY — DX: Personal history of other diseases of the digestive system: Z87.19

## 2014-10-31 MED ORDER — HYOSCYAMINE SULFATE 0.125 MG SL SUBL: 0.1250 mg | SUBLINGUAL_TABLET | SUBLINGUAL | Status: DC | PRN

## 2014-10-31 NOTE — Progress Notes (Signed)
Pre visit review using our clinic review tool, if applicable. No additional management support is needed unless otherwise documented below in the visit note. 

## 2014-10-31 NOTE — Patient Instructions (Signed)
High fiber diet  High-Fiber Diet Fiber is found in fruits, vegetables, and grains. A high-fiber diet encourages the addition of more whole grains, legumes, fruits, and vegetables in your diet. The recommended amount of fiber for adult males is 38 g per day. For adult females, it is 25 g per day. Pregnant and lactating women should get 28 g of fiber per day. If you have a digestive or bowel problem, ask your caregiver for advice before adding high-fiber foods to your diet. Eat a variety of high-fiber foods instead of only a select few type of foods.  PURPOSE  To increase stool bulk.  To make bowel movements more regular to prevent constipation.  To lower cholesterol.  To prevent overeating. WHEN IS THIS DIET USED?  It may be used if you have constipation and hemorrhoids.  It may be used if you have uncomplicated diverticulosis (intestine condition) and irritable bowel syndrome.  It may be used if you need help with weight management.  It may be used if you want to add it to your diet as a protective measure against atherosclerosis, diabetes, and cancer. SOURCES OF FIBER  Whole-grain breads and cereals.  Fruits, such as apples, oranges, bananas, berries, prunes, and pears.  Vegetables, such as green peas, carrots, sweet potatoes, beets, broccoli, cabbage, spinach, and artichokes.  Legumes, such split peas, soy, lentils.  Almonds. FIBER CONTENT IN FOODS Starches and Grains / Dietary Fiber (g)  Cheerios, 1 cup / 3 g  Corn Flakes cereal, 1 cup / 0.7 g  Rice crispy treat cereal, 1 cup / 0.3 g  Instant oatmeal (cooked),  cup / 2 g  Frosted wheat cereal, 1 cup / 5.1 g  Brown, long-grain rice (cooked), 1 cup / 3.5 g  White, long-grain rice (cooked), 1 cup / 0.6 g  Enriched macaroni (cooked), 1 cup / 2.5 g Legumes / Dietary Fiber (g)  Baked beans (canned, plain, or vegetarian),  cup / 5.2 g  Kidney beans (canned),  cup / 6.8 g  Pinto beans (cooked),  cup / 5.5  g Breads and Crackers / Dietary Fiber (g)  Plain or honey graham crackers, 2 squares / 0.7 g  Saltine crackers, 3 squares / 0.3 g  Plain, salted pretzels, 10 pieces / 1.8 g  Whole-wheat bread, 1 slice / 1.9 g  White bread, 1 slice / 0.7 g  Raisin bread, 1 slice / 1.2 g  Plain bagel, 3 oz / 2 g  Flour tortilla, 1 oz / 0.9 g  Corn tortilla, 1 small / 1.5 g  Hamburger or hotdog bun, 1 small / 0.9 g Fruits / Dietary Fiber (g)  Apple with skin, 1 medium / 4.4 g  Sweetened applesauce,  cup / 1.5 g  Banana,  medium / 1.5 g  Grapes, 10 grapes / 0.4 g  Orange, 1 small / 2.3 g  Raisin, 1.5 oz / 1.6 g  Melon, 1 cup / 1.4 g Vegetables / Dietary Fiber (g)  Green beans (canned),  cup / 1.3 g  Carrots (cooked),  cup / 2.3 g  Broccoli (cooked),  cup / 2.8 g  Peas (cooked),  cup / 4.4 g  Mashed potatoes,  cup / 1.6 g  Lettuce, 1 cup / 0.5 g  Corn (canned),  cup / 1.6 g  Tomato,  cup / 1.1 g Document Released: 06/13/2005 Document Revised: 12/13/2011 Document Reviewed: 09/15/2011 Exit[employer] Patient Information 2015 [place]. This information is not intended to replace advice given to you by  your health care provider. Make sure you discuss any questions you have with your health care provider.  

## 2014-10-31 NOTE — Progress Notes (Signed)
Subjective:    Patient ID: Sheryl [name], female    DOB: Sep 24, 1945, 69 y.o.   MRN: [id number]  HPI  69 year old patient who is seen today for her biannual follow-up.  Medical problems include a history of neurosarcoidosis.  CLL, hypertension and allergic rhinitis. Today she complains of some intermittent abdominal cramping associated with diarrhea.  She states that she feels this is a flareup of IBS.  In general doing well.  No other concerns or complaints.  She does have stable nonprogressive.  CLL. She is limited by back pain, osteoarthritis, fibromyalgia.  She also has a history of spinal stenosis  Past Medical History  Diagnosis Date  . 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 DEGENERATIVE JOINT DISEASE 12/14/2006  . EXOGENOUS OBESITY 12/14/2006  . HYPERLIPIDEMIA 12/14/2006  . HYPERTENSION 12/14/2006  . Neurosarcoidosis   . FIBROMYALGIA 12/14/2006  . Sleep apnea   . Hearing loss of left ear   . Vision loss of right eye   . IBS (irritable bowel syndrome)     History   Social History  . Marital Status: Married    Spouse Name: N/A  . Number of Children: 0  . Years of Education: hs   Occupational History  . disabled   . clerical    Social History Main Topics  . Smoking status: Never Smoker   . Smokeless tobacco: Never Used  . Alcohol Use: No     Comment: One drink per year  . Drug Use: No  . Sexual Activity: Not on file   Other Topics Concern  . Not on file   Social History Narrative    Past Surgical History  Procedure Laterality Date  . Cholecystectomy    . Tonsillectomy    . Spinal fusion  07/25/2012  . Brain biopsy      Loleta Books    Family History  Problem Relation Age of Onset  . Diabetes Sister   . Hypertension Mother   . Emphysema Father     Allergies  Allergen Reactions  . Adhesive [Tape] Rash  . Cyclophosphamide Other (See Comments)    Caused diverticulitis.  She would not eat or drink for days, while taking it.    Current Outpatient Prescriptions on File  Prior to Visit  Medication Sig Dispense Refill  . aspirin 81 MG tablet Take 81 mg by mouth daily.      Marland Kitchen atorvastatin (LIPITOR) 40 MG tablet Take 1 tablet (40 mg total) by mouth daily. 90 tablet 3  . brimonidine (ALPHAGAN) 0.15 % ophthalmic solution     . cetirizine (ZYRTEC) 10 MG tablet Take 10 mg by mouth daily.      . Cholecalciferol (VITAMIN D3) 2000 UNITS TABS Take 1 tablet by mouth daily.    . cyclobenzaprine (FLEXERIL) 10 MG tablet Take 1 tablet (10 mg total) by mouth 3 (three) times daily as needed. 60 tablet 3  . fluticasone (FLONASE) 50 MCG/ACT nasal spray Place 2 sprays into the nose daily. OTC    . lisinopril (PRINIVIL,ZESTRIL) 40 MG tablet TAKE ONE TABLET BY MOUTH ONCE DAILY 90 tablet 3  . omeprazole (PRILOSEC) 20 MG capsule Take 1 capsule (20 mg total) by mouth daily. 90 capsule 3  . ondansetron (ZOFRAN) 8 MG tablet Take 8 mg by mouth as needed.     . predniSONE (DELTASONE) 2.5 MG tablet Take 1 tablet by mouth daily.    Marland Kitchen sulfamethoxazole-trimethoprim (BACTRIM DS) 800-160 MG per tablet Take 1 tablet by mouth 3 (three) times a week. [place]  tablet 3  . traMADol (ULTRAM) 50 MG tablet Take 1 tablet (50 mg total) by mouth every 6 (six) hours as needed. 90 tablet 4   No current facility-administered medications on file prior to visit.    BP 120/74 mmHg  Pulse 56  Temp(Src) 97.8 F (36.6 C) (Oral)  Resp 20  Ht 5' (1.524 m)  Wt 193 lb (87.544 kg)  BMI 37.69 kg/m2  SpO2 98%     Review of Systems  Constitutional: Negative.   HENT: Negative for congestion, dental problem, hearing loss, rhinorrhea, sinus pressure, sore throat and tinnitus.   Eyes: Negative for pain, discharge and visual disturbance.  Respiratory: Negative for cough and shortness of breath.   Cardiovascular: Negative for chest pain, palpitations and leg swelling.  Gastrointestinal: Positive for abdominal pain and diarrhea. Negative for nausea, vomiting, constipation, blood in stool and abdominal distention.    Genitourinary: Negative for dysuria, urgency, frequency, hematuria, flank pain, vaginal bleeding, vaginal discharge, difficulty urinating, vaginal pain and pelvic pain.  Musculoskeletal: Positive for back pain and gait problem. Negative for joint swelling and arthralgias.  Skin: Negative for rash.  Neurological: Negative for dizziness, syncope, speech difficulty, weakness, numbness and headaches.  Hematological: Negative for adenopathy.  Psychiatric/Behavioral: Negative for behavioral problems, dysphoric mood and agitation. The patient is not nervous/anxious.        Objective:   Physical Exam  Constitutional: She is oriented to person, place, and time. She appears well-developed and well-nourished.  HENT:  Head: Normocephalic.  Right Ear: External ear normal.  Left Ear: External ear normal.  Mouth/Throat: Oropharynx is clear and moist.  Eyes: Conjunctivae and EOM are normal. Pupils are equal, round, and reactive to light.  Neck: Normal range of motion. Neck supple. No thyromegaly present.  Cardiovascular: Normal rate, regular rhythm, normal heart sounds and intact distal pulses.   Pulmonary/Chest: Effort normal and breath sounds normal.  Abdominal: Soft. Bowel sounds are normal. She exhibits no mass. There is no tenderness.  Musculoskeletal: Normal range of motion.  Lymphadenopathy:    She has no cervical adenopathy.  Neurological: She is alert and oriented to person, place, and time.  Skin: Skin is warm and dry. No rash noted.  Psychiatric: She has a normal mood and affect. Her behavior is normal.          Assessment & Plan:   Intermittent abdominal discomfort with diarrhea.  Will place on a high-fiber diet.  Will give a trial of anti-spasmodics Hypertension, stable.  We'll continue present regimen Osteoarthritis CLL Dyslipidemia.  Continue statin therapy  CPX 6 months

## 2014-11-04 DIAGNOSIS — D8689 Sarcoidosis of other sites: Secondary | ICD-10-CM | POA: Diagnosis not present

## 2014-11-04 DIAGNOSIS — H40052 Ocular hypertension, left eye: Secondary | ICD-10-CM | POA: Diagnosis not present

## 2014-11-04 DIAGNOSIS — H2513 Age-related nuclear cataract, bilateral: Secondary | ICD-10-CM | POA: Diagnosis not present

## 2014-11-04 DIAGNOSIS — H469 Unspecified optic neuritis: Secondary | ICD-10-CM | POA: Diagnosis not present

## 2014-11-17 ENCOUNTER — Telehealth: Payer: Self-pay | Admitting: Family Medicine

## 2014-11-17 NOTE — Telephone Encounter (Signed)
Left a message for the pt to return my call.  Need to see if she has had a recent mammogram. 

## 2014-12-22 ENCOUNTER — Other Ambulatory Visit: Payer: Self-pay

## 2015-01-13 ENCOUNTER — Other Ambulatory Visit: Payer: Self-pay | Admitting: Internal Medicine

## 2015-01-13 ENCOUNTER — Encounter: Payer: Self-pay | Admitting: Internal Medicine

## 2015-01-23 DIAGNOSIS — R0682 Tachypnea, not elsewhere classified: Secondary | ICD-10-CM | POA: Diagnosis not present

## 2015-01-23 DIAGNOSIS — M797 Fibromyalgia: Secondary | ICD-10-CM | POA: Diagnosis not present

## 2015-01-23 DIAGNOSIS — R0602 Shortness of breath: Secondary | ICD-10-CM | POA: Diagnosis not present

## 2015-01-23 DIAGNOSIS — R0600 Dyspnea, unspecified: Secondary | ICD-10-CM | POA: Diagnosis not present

## 2015-01-23 DIAGNOSIS — K219 Gastro-esophageal reflux disease without esophagitis: Secondary | ICD-10-CM | POA: Diagnosis not present

## 2015-01-23 DIAGNOSIS — I10 Essential (primary) hypertension: Secondary | ICD-10-CM | POA: Diagnosis not present

## 2015-01-23 DIAGNOSIS — R0789 Other chest pain: Secondary | ICD-10-CM | POA: Diagnosis not present

## 2015-01-23 DIAGNOSIS — R062 Wheezing: Secondary | ICD-10-CM | POA: Diagnosis not present

## 2015-02-04 DIAGNOSIS — H2513 Age-related nuclear cataract, bilateral: Secondary | ICD-10-CM | POA: Diagnosis not present

## 2015-02-04 DIAGNOSIS — D8689 Sarcoidosis of other sites: Secondary | ICD-10-CM | POA: Diagnosis not present

## 2015-02-04 DIAGNOSIS — H40052 Ocular hypertension, left eye: Secondary | ICD-10-CM | POA: Diagnosis not present

## 2015-02-04 DIAGNOSIS — H469 Unspecified optic neuritis: Secondary | ICD-10-CM | POA: Diagnosis not present

## 2015-04-01 ENCOUNTER — Ambulatory Visit (INDEPENDENT_AMBULATORY_CARE_PROVIDER_SITE_OTHER): Payer: Medicare Other | Admitting: Internal Medicine

## 2015-04-01 ENCOUNTER — Ambulatory Visit (INDEPENDENT_AMBULATORY_CARE_PROVIDER_SITE_OTHER): Payer: Medicare Other | Admitting: Podiatry

## 2015-04-01 ENCOUNTER — Ambulatory Visit (INDEPENDENT_AMBULATORY_CARE_PROVIDER_SITE_OTHER): Payer: Medicare Other

## 2015-04-01 ENCOUNTER — Encounter: Payer: Self-pay | Admitting: Internal Medicine

## 2015-04-01 ENCOUNTER — Encounter: Payer: Self-pay | Admitting: Podiatry

## 2015-04-01 VITALS — BP 150/76 | HR 86 | Temp 98.0°F | Resp 20 | Ht 60.0 in | Wt 195.0 lb

## 2015-04-01 VITALS — BP 114/61 | HR 67 | Resp 16 | Ht 60.0 in | Wt 200.0 lb

## 2015-04-01 DIAGNOSIS — H469 Unspecified optic neuritis: Secondary | ICD-10-CM

## 2015-04-01 DIAGNOSIS — M779 Enthesopathy, unspecified: Secondary | ICD-10-CM

## 2015-04-01 DIAGNOSIS — Z23 Encounter for immunization: Secondary | ICD-10-CM | POA: Diagnosis not present

## 2015-04-01 DIAGNOSIS — M21619 Bunion of unspecified foot: Secondary | ICD-10-CM

## 2015-04-01 DIAGNOSIS — H468 Other optic neuritis: Secondary | ICD-10-CM | POA: Diagnosis not present

## 2015-04-01 DIAGNOSIS — M79671 Pain in right foot: Secondary | ICD-10-CM

## 2015-04-01 DIAGNOSIS — E785 Hyperlipidemia, unspecified: Secondary | ICD-10-CM | POA: Diagnosis not present

## 2015-04-01 DIAGNOSIS — D8689 Sarcoidosis of other sites: Secondary | ICD-10-CM | POA: Diagnosis not present

## 2015-04-01 DIAGNOSIS — J3089 Other allergic rhinitis: Secondary | ICD-10-CM

## 2015-04-01 DIAGNOSIS — I10 Essential (primary) hypertension: Secondary | ICD-10-CM | POA: Diagnosis not present

## 2015-04-01 MED ORDER — TRIAMCINOLONE ACETONIDE 10 MG/ML IJ SUSP
10.0000 mg | Freq: Once | INTRAMUSCULAR | Status: AC
  Administered 2015-04-01: 10 mg

## 2015-04-01 NOTE — Progress Notes (Signed)
Subjective:    Patient ID: Sheryl [name], female    DOB: 08-Feb-1946, 69 y.o.   MRN: [id number]  HPI 69 year old patient who is seen today for her biannual follow-up.  She has essential hypertension.  She also has a history of IBS which has flared up a bit.  She describes cramping, urgency, loose stool with occasional incontinence due to her mobility limitations She has a history of allergic rhinitis and is requesting referral for allergy evaluation.  Her husband sees Dr. Fredderick Phenix She has the stable CLL, history of neurosarcoidosis. No other concerns or complaints. She remains on low-dose prednisone.  Past Medical History  Diagnosis Date  . 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 DEGENERATIVE JOINT DISEASE 12/14/2006  . EXOGENOUS OBESITY 12/14/2006  . HYPERLIPIDEMIA 12/14/2006  . HYPERTENSION 12/14/2006  . Neurosarcoidosis ([facility])   . FIBROMYALGIA 12/14/2006  . Sleep apnea   . Hearing loss of left ear   . Vision loss of right eye   . IBS (irritable bowel syndrome)     Social History   Social History  . Marital Status: Married    Spouse Name: N/A  . Number of Children: 0  . Years of Education: hs   Occupational History  . disabled   . clerical    Social History Main Topics  . Smoking status: Never Smoker   . Smokeless tobacco: Never Used  . Alcohol Use: No     Comment: One drink per year  . Drug Use: No  . Sexual Activity: Not on file   Other Topics Concern  . Not on file   Social History Narrative    Past Surgical History  Procedure Laterality Date  . Cholecystectomy    . Tonsillectomy    . Spinal fusion  07/25/2012  . Brain biopsy      Loleta Books    Family History  Problem Relation Age of Onset  . Diabetes Sister   . Hypertension Mother   . Emphysema Father     Allergies  Allergen Reactions  . Adhesive [Tape] Rash  . Cyclophosphamide Other (See Comments)    Caused diverticulitis.  She would not eat or drink for days, while taking it.    Current Outpatient  Prescriptions on File Prior to Visit  Medication Sig Dispense Refill  . aspirin 81 MG tablet Take 81 mg by mouth daily.      Marland Kitchen atorvastatin (LIPITOR) 40 MG tablet TAKE 1 TABLET EVERY DAY 90 tablet 1  . brimonidine (ALPHAGAN) 0.15 % ophthalmic solution Place 1 drop into the left eye 2 (two) times daily.     . cetirizine (ZYRTEC) 10 MG tablet Take 10 mg by mouth daily.      . Cholecalciferol (VITAMIN D3) 2000 UNITS TABS Take 1 tablet by mouth daily.    . cyclobenzaprine (FLEXERIL) 10 MG tablet Take 1 tablet (10 mg total) by mouth 3 (three) times daily as needed. 60 tablet 3  . ENSURE (ENSURE) Take 1 Can by mouth daily.    . fluticasone (FLONASE) 50 MCG/ACT nasal spray Place 2 sprays into the nose daily. OTC    . hyoscyamine (LEVSIN SL) 0.125 MG SL tablet Place 1 tablet (0.125 mg total) under the tongue every 4 (four) hours as needed. 60 tablet 3  . lisinopril (PRINIVIL,ZESTRIL) 40 MG tablet TAKE ONE TABLET BY MOUTH ONCE DAILY 90 tablet 3  . omeprazole (PRILOSEC) 20 MG capsule TAKE 1 CAPSULE ONE TIME DAILY 90 capsule 3  . ondansetron (ZOFRAN) 8 MG tablet Take 8  mg by mouth as needed.     . predniSONE (DELTASONE) 2.5 MG tablet Take 1 tablet by mouth daily.    Marland Kitchen sulfamethoxazole-trimethoprim (BACTRIM DS,SEPTRA DS) 800-160 MG per tablet TAKE 1 TABLET THREE TIMES WEEKLY 39 tablet 3  . traMADol (ULTRAM) 50 MG tablet Take 1 tablet (50 mg total) by mouth every 6 (six) hours as needed. 90 tablet 4   No current facility-administered medications on file prior to visit.    BP 150/76 mmHg  Pulse 86  Temp(Src) 98 F (36.7 C) (Oral)  Resp 20  Ht 5' (1.524 m)  Wt 195 lb (88.451 kg)  BMI 38.08 kg/m2  SpO2 98%      Review of Systems  Constitutional: Negative.   HENT: Positive for congestion, postnasal drip and rhinorrhea. Negative for dental problem, hearing loss, sinus pressure, sore throat and tinnitus.   Eyes: Negative for pain, discharge and visual disturbance.  Respiratory: Negative for cough  and shortness of breath.   Cardiovascular: Negative for chest pain, palpitations and leg swelling.  Gastrointestinal: Positive for abdominal pain and diarrhea. Negative for nausea, vomiting, constipation, blood in stool and abdominal distention.  Genitourinary: Negative for dysuria, urgency, frequency, hematuria, flank pain, vaginal bleeding, vaginal discharge, difficulty urinating, vaginal pain and pelvic pain.  Musculoskeletal: Negative for joint swelling, arthralgias and gait problem.  Skin: Negative for rash.  Neurological: Negative for dizziness, syncope, speech difficulty, weakness, numbness and headaches.  Hematological: Negative for adenopathy.  Psychiatric/Behavioral: Negative for behavioral problems, dysphoric mood and agitation. The patient is not nervous/anxious.        Objective:   Physical Exam  Constitutional: She is oriented to person, place, and time. She appears well-developed and well-nourished.  Weight 195 Uses wheelchair  HENT:  Head: Normocephalic.  Right Ear: External ear normal.  Left Ear: External ear normal.  Mouth/Throat: Oropharynx is clear and moist.  Eyes: Conjunctivae and EOM are normal. Pupils are equal, round, and reactive to light.  Neck: Normal range of motion. Neck supple. No thyromegaly present.  Cardiovascular: Normal rate, regular rhythm, normal heart sounds and intact distal pulses.   Pulmonary/Chest: Effort normal and breath sounds normal.  Abdominal: Soft. Bowel sounds are normal. She exhibits no mass. There is no tenderness.  Musculoskeletal: Normal range of motion.  Lymphadenopathy:    She has no cervical adenopathy.  Neurological: She is alert and oriented to person, place, and time.  Skin: Skin is warm and dry. No rash noted.  Psychiatric: She has a normal mood and affect. Her behavior is normal.          Assessment & Plan:   Essential hypertension History of CLL Sarcoidosis Allergic rhinitis.  Patient requests referral for  allergy evaluation.  Will schedule   Follow-up oncology Allergies referral Preventive health.  Flu vaccine administered  CPX 6 months

## 2015-04-01 NOTE — Progress Notes (Signed)
   Subjective:    Patient ID: Sheryl [name], female    DOB: 04/01/1946, 69 y.o.   MRN: [id number]  HPI  Patient presents with foot pain in their right foot, medial side. (possible bunion?). This has been going on for the past 2 weeks.   Review of Systems  HENT: Positive for hearing loss and sinus pressure.   Eyes: Positive for visual disturbance.  Gastrointestinal: Positive for diarrhea.  Genitourinary: Positive for frequency.  Musculoskeletal: Positive for gait problem.  Allergic/Immunologic: Positive for environmental allergies.  All other systems reviewed and are negative.      Objective:   Physical Exam        Assessment & Plan:

## 2015-04-01 NOTE — Progress Notes (Signed)
Subjective:     Patient ID: Sheryl [name], female   DOB: 1945-07-30, 69 y.o.   MRN: [id number]  HPI patient presents with bunion of the right foot that's painful with redness and has tried wider shoes without relief and it's only been present for a short period but sore   Review of Systems  All other systems reviewed and are negative.      Objective:   Physical Exam  Constitutional: She is oriented to person, place, and time.  Cardiovascular: Intact distal pulses.   Musculoskeletal: Normal range of motion.  Neurological: She is oriented to person, place, and time.  Skin: Skin is warm.  Nursing note and vitals reviewed.  neurovascular status intact muscle strength adequate range of motion within normal limits with patient noted to have redness and pain around the first MPJ of the right foot with inflammation and fluid buildup noted. It is localized in nature and patient's found to have good digital perfusion and is well oriented 3     Assessment:     Inflammatory capsulitis around structural bunion site right with pain    Plan:     H&P condition reviewed and x-rays reviewed with patient. Injected around the first MPJ 3 mg Kenalog 5 mg Xylocaine advised on wider shoes and soaks and reappoint to recheck

## 2015-04-01 NOTE — Patient Instructions (Signed)
Limit your sodium (Salt) intake  You need to lose weight.  Consider a lower calorie diet and regular exercise.  Return in 6 months for follow-up   

## 2015-04-01 NOTE — Progress Notes (Signed)
Pre visit review using our clinic review tool, if applicable. No additional management support is needed unless otherwise documented below in the visit note. 

## 2015-04-13 ENCOUNTER — Other Ambulatory Visit: Payer: Self-pay | Admitting: Internal Medicine

## 2015-05-04 ENCOUNTER — Ambulatory Visit: Payer: Medicare Other | Admitting: Internal Medicine

## 2015-05-06 ENCOUNTER — Ambulatory Visit: Payer: Medicare Other | Admitting: Internal Medicine

## 2015-05-11 DIAGNOSIS — I10 Essential (primary) hypertension: Secondary | ICD-10-CM | POA: Diagnosis not present

## 2015-05-11 DIAGNOSIS — R110 Nausea: Secondary | ICD-10-CM | POA: Diagnosis not present

## 2015-05-11 DIAGNOSIS — K219 Gastro-esophageal reflux disease without esophagitis: Secondary | ICD-10-CM | POA: Diagnosis not present

## 2015-05-11 DIAGNOSIS — R197 Diarrhea, unspecified: Secondary | ICD-10-CM | POA: Diagnosis not present

## 2015-05-11 DIAGNOSIS — M797 Fibromyalgia: Secondary | ICD-10-CM | POA: Diagnosis not present

## 2015-05-11 DIAGNOSIS — E785 Hyperlipidemia, unspecified: Secondary | ICD-10-CM | POA: Diagnosis not present

## 2015-05-11 DIAGNOSIS — Z79899 Other long term (current) drug therapy: Secondary | ICD-10-CM | POA: Diagnosis not present

## 2015-05-11 DIAGNOSIS — K5790 Diverticulosis of intestine, part unspecified, without perforation or abscess without bleeding: Secondary | ICD-10-CM | POA: Diagnosis not present

## 2015-05-11 DIAGNOSIS — N390 Urinary tract infection, site not specified: Secondary | ICD-10-CM | POA: Diagnosis not present

## 2015-05-11 DIAGNOSIS — R1030 Lower abdominal pain, unspecified: Secondary | ICD-10-CM | POA: Diagnosis not present

## 2015-05-11 DIAGNOSIS — Z7982 Long term (current) use of aspirin: Secondary | ICD-10-CM | POA: Diagnosis not present

## 2015-06-08 ENCOUNTER — Ambulatory Visit: Payer: Medicare Other | Admitting: Family Medicine

## 2015-08-10 DIAGNOSIS — N3944 Nocturnal enuresis: Secondary | ICD-10-CM | POA: Diagnosis not present

## 2015-08-10 DIAGNOSIS — N3941 Urge incontinence: Secondary | ICD-10-CM | POA: Diagnosis not present

## 2015-08-10 DIAGNOSIS — R351 Nocturia: Secondary | ICD-10-CM | POA: Diagnosis not present

## 2015-08-10 DIAGNOSIS — N3020 Other chronic cystitis without hematuria: Secondary | ICD-10-CM | POA: Diagnosis not present

## 2015-08-10 DIAGNOSIS — B9620 Unspecified Escherichia coli [E. coli] as the cause of diseases classified elsewhere: Secondary | ICD-10-CM | POA: Diagnosis not present

## 2015-08-10 DIAGNOSIS — N390 Urinary tract infection, site not specified: Secondary | ICD-10-CM | POA: Diagnosis not present

## 2015-08-10 DIAGNOSIS — Z0000 Encounter for general adult medical examination without abnormal findings: Secondary | ICD-10-CM | POA: Diagnosis not present

## 2015-08-11 DIAGNOSIS — H469 Unspecified optic neuritis: Secondary | ICD-10-CM | POA: Diagnosis not present

## 2015-08-11 DIAGNOSIS — D8689 Sarcoidosis of other sites: Secondary | ICD-10-CM | POA: Diagnosis not present

## 2015-08-11 DIAGNOSIS — H53482 Generalized contraction of visual field, left eye: Secondary | ICD-10-CM | POA: Diagnosis not present

## 2015-08-11 DIAGNOSIS — H40052 Ocular hypertension, left eye: Secondary | ICD-10-CM | POA: Diagnosis not present

## 2015-08-24 ENCOUNTER — Encounter: Payer: Self-pay | Admitting: Internal Medicine

## 2015-08-26 DIAGNOSIS — N281 Cyst of kidney, acquired: Secondary | ICD-10-CM | POA: Diagnosis not present

## 2015-08-26 DIAGNOSIS — N3941 Urge incontinence: Secondary | ICD-10-CM | POA: Diagnosis not present

## 2015-08-26 DIAGNOSIS — N3020 Other chronic cystitis without hematuria: Secondary | ICD-10-CM | POA: Diagnosis not present

## 2015-08-27 ENCOUNTER — Other Ambulatory Visit (INDEPENDENT_AMBULATORY_CARE_PROVIDER_SITE_OTHER): Payer: Medicare Other

## 2015-08-27 ENCOUNTER — Encounter: Payer: Self-pay | Admitting: Internal Medicine

## 2015-08-27 ENCOUNTER — Ambulatory Visit (INDEPENDENT_AMBULATORY_CARE_PROVIDER_SITE_OTHER): Payer: Medicare Other | Admitting: Internal Medicine

## 2015-08-27 VITALS — BP 154/66 | HR 56 | Ht 60.0 in | Wt 191.4 lb

## 2015-08-27 DIAGNOSIS — R159 Full incontinence of feces: Secondary | ICD-10-CM

## 2015-08-27 DIAGNOSIS — R197 Diarrhea, unspecified: Secondary | ICD-10-CM | POA: Diagnosis not present

## 2015-08-27 DIAGNOSIS — K529 Noninfective gastroenteritis and colitis, unspecified: Secondary | ICD-10-CM | POA: Diagnosis not present

## 2015-08-27 LAB — IGA: IgA: 54 mg/dL — ABNORMAL LOW (ref 68–378)

## 2015-08-27 MED ORDER — POLYETHYLENE GLYCOL 3350 17 GM/SCOOP PO POWD: 1.0000 | Freq: Once | ORAL | Status: DC

## 2015-08-27 NOTE — Patient Instructions (Addendum)
Your physician has requested that you go to the basement for the following lab work before leaving today: TTG, IGA  Today you have been given a low residue diet handout to read and follow.  You have been scheduled for a colonoscopy. Please follow written instructions given to you at your visit today.  Please pick up your over the counter prep supplies at the pharmacy. If you use inhalers (even only as needed), please bring them with you on the day of your procedure.  Try taking imodium 2 tablets twice a day vs taking the generic zofran for the diarrhea.   I appreciate the opportunity to care for you. Silvano Rusk, MD, [facility]

## 2015-08-27 NOTE — Progress Notes (Signed)
   Subjective:    Patient ID: Sheryl [name], female    DOB: [birth year], 70 y.o.   MRN: [id number] Chief complaint: Diarrhea HPI  The patient is a very nice 70 year old woman here with her husband because of chronic recurrent diarrhea. Her husband points out that it seemed to start after she took cyclophosphamide for neurologic sarcoid. She describes frequent diarrhea mushy sometimes watery she also has sometimes has formed stools. He has been told she has IBS. She was told to increase vegetables in her diet to increase fiber to see at that would help but is really not helped if not made it worse. She has tried loperamide at times with some benefit. Not user regularly. She has frequent urgent stools and has urge incontinence as well. It's especially a problem after meals but it can occur in her sleep. She does not recall being tested for C. difficile or having other stool studies done. 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 Her husband reports that her CLL and fibromyalgia are much better since treatment for the neurosarcoid. He does not have abdominal pain problems and she does not report bleeding. There is a remote history of a colonoscopy or screening in 2006 that I did was negative. Medications, allergies, past medical history, past surgical history, family history and social history are reviewed and updated in the EMR.  Review of Systems She is planning of some allergy symptoms and some back pain otherwise as per history of present illness all other review of systems are negative.    Objective:   Physical Exam  @BP  154/66 mmHg  Pulse 56  Ht 5' (1.524 m)  Wt 191 lb 6 oz (86.807 kg)  BMI 37.38 kg/m2@  General:   in no acute distress in  wheelchair Eyes:  anicteric. ENT:   Mouth and posterior pharynx free of lesions.  Neck:   supple w/o thyromegaly or mass.  Lungs: Clear to auscultation bilaterally. Heart:  S1S2, no rubs, murmurs, gallops. Abdomen:  soft, non-tender, no hepatosplenomegaly, hernia, or mass and BS+.  Rectal: deferred Lymph:  no cervical or supraclavicular adenopathy. Extremities:   no edema, cyanosis or clubbing Skin   no rash. Neuro:  A&O x 3.  Psych:  appropriate mood and  Affect.   Data Reviewed: As per history of present illness      Assessment & Plan:  Chronic diarrhea  Fecal incontinence  This may be more than IBS. She could have a microscopic or macroscopic colitis. Low fiber diet instructions given Loperamide vs ondansetron or both to treat the diarrhea she has both of these at home. TTG Ab, IgA level to screen for celiac disease colonoscopy - she is able to transfer so ok for [facility] The risks and benefits as well as alternatives of endoscopic procedure(s) have been discussed and reviewed. All questions answered. The patient agrees to proceed.

## 2015-08-28 LAB — TISSUE TRANSGLUTAMINASE, IGA: Tissue Transglutaminase Ab, IgA: 1 U/mL (ref <4)

## 2015-08-31 NOTE — Progress Notes (Signed)
Quick Note:  Negative for celiac sprue My Chart message sent ______

## 2015-09-24 ENCOUNTER — Telehealth: Payer: Self-pay | Admitting: Hematology and Oncology

## 2015-09-24 ENCOUNTER — Encounter: Payer: Self-pay | Admitting: Hematology and Oncology

## 2015-09-24 ENCOUNTER — Ambulatory Visit (HOSPITAL_BASED_OUTPATIENT_CLINIC_OR_DEPARTMENT_OTHER): Payer: Medicare Other | Admitting: Hematology and Oncology

## 2015-09-24 ENCOUNTER — Other Ambulatory Visit (HOSPITAL_BASED_OUTPATIENT_CLINIC_OR_DEPARTMENT_OTHER): Payer: Medicare Other

## 2015-09-24 VITALS — BP 154/55 | HR 58 | Temp 97.4°F | Resp 18 | Ht 60.0 in | Wt 193.7 lb

## 2015-09-24 DIAGNOSIS — D8689 Sarcoidosis of other sites: Secondary | ICD-10-CM | POA: Diagnosis not present

## 2015-09-24 DIAGNOSIS — C9110 Chronic lymphocytic leukemia of B-cell type not having achieved remission: Secondary | ICD-10-CM

## 2015-09-24 LAB — COMPREHENSIVE METABOLIC PANEL
ALT: 20 U/L (ref 0–55)
AST: 19 U/L (ref 5–34)
Albumin: 4.1 g/dL (ref 3.5–5.0)
Alkaline Phosphatase: 124 U/L (ref 40–150)
Anion Gap: 9 mEq/L (ref 3–11)
BUN: 21.1 mg/dL (ref 7.0–26.0)
CO2: 28 mEq/L (ref 22–29)
Calcium: 9.8 mg/dL (ref 8.4–10.4)
Chloride: 107 mEq/L (ref 98–109)
Creatinine: 1.1 mg/dL (ref 0.6–1.1)
EGFR: 52 mL/min/{1.73_m2} — ABNORMAL LOW (ref >90)
Glucose: 84 mg/dl (ref 70–140)
Potassium: 4.5 mEq/L (ref 3.5–5.1)
Sodium: 144 mEq/L (ref 136–145)
Total Bilirubin: 0.41 mg/dL (ref 0.20–1.20)
Total Protein: 7.2 g/dL (ref 6.4–8.3)

## 2015-09-24 LAB — CBC WITH DIFFERENTIAL/PLATELET
BASO%: 0.2 % (ref 0.0–2.0)
Basophils Absolute: 0 10*3/uL (ref 0.0–0.1)
EOS%: 2.2 % (ref 0.0–7.0)
Eosinophils Absolute: 0.1 10*3/uL (ref 0.0–0.5)
HCT: 43.4 % (ref 34.8–46.6)
HGB: 14 g/dL (ref 11.6–15.9)
LYMPH%: 22.6 % (ref 14.0–49.7)
MCH: 27.5 pg (ref 25.1–34.0)
MCHC: 32.3 g/dL (ref 31.5–36.0)
MCV: 85.1 fL (ref 79.5–101.0)
MONO#: 0.3 10*3/uL (ref 0.1–0.9)
MONO%: 4.8 % (ref 0.0–14.0)
NEUT#: 4.2 10*3/uL (ref 1.5–6.5)
NEUT%: 70.2 % (ref 38.4–76.8)
Platelets: 198 10*3/uL (ref 145–400)
RBC: 5.1 10*6/uL (ref 3.70–5.45)
RDW: 16.2 % — ABNORMAL HIGH (ref 11.2–14.5)
WBC: 6 10*3/uL (ref 3.9–10.3)
lymph#: 1.4 10*3/uL (ref 0.9–3.3)

## 2015-09-24 NOTE — Progress Notes (Signed)
I reviewed the patient's records extensive and collaborated the history with the patient. Summary of her history is as follows: This patient had diagnosis of stage 0 CLL. Since she was seen here in February, she was diagnosed with neurosarcoidosis at [facility]. She had almost left eye blindness. 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 The patient was placed on high-dose corticosteroid therapy followed by oral cyclophosphamide. She had recurrent side effects with nausea, anorexia, dehydration and diverticulitis related to side effects of treatment. The patient self discontinue cyclophosphamide about a month ago. She is maintained on 5 mg chronic prednisone therapy. She had uncontrolled blood sugar and hypertension while on high-dose prednisone. She also developed profound weakness and had mild difficulties controlling the bowel and bladder function. She had recurrent falls recently and have severe back pain thought to be related to compression fracture.  She is maintained on 2.5 mg chronic prednisone therapy to prevent relapse of neurosarcoidosis  INTERVAL HISTORY: Please see below for problem oriented charting. She is doing well upon from history of recurrent urinary tract infection. She is currently undergoing evaluation with urologist. She denies new lymphadenopathy.  The patient denies any recent signs or symptoms of bleeding such as spontaneous epistaxis, hematuria or hematochezia. She continues on low-dose prednisone therapy per recommendation from [facility]:   Constitutional: Denies fevers, chills or abnormal weight loss Ears, nose, mouth, throat, and face: Denies mucositis or sore throat Respiratory: Denies cough, dyspnea or wheezes Cardiovascular: Denies palpitation, chest discomfort or lower extremity swelling Gastrointestinal:  Denies nausea, heartburn or change in bowel habits Skin: Denies abnormal skin rashes Lymphatics: Denies new  lymphadenopathy or easy bruising Neurological:Denies numbness, tingling or new weaknesses Behavioral/Psych: Mood is stable, no new changes  All other systems were reviewed with the patient and are negative.  I have reviewed the past medical history, past surgical history, social history and family history with the patient and they are unchanged from previous note.  ALLERGIES:  is allergic to adhesive and cyclophosphamide.  MEDICATIONS:  Current Outpatient Prescriptions  Medication Sig Dispense Refill  . aspirin 81 MG tablet Take 81 mg by mouth daily.      Marland Kitchen atorvastatin (LIPITOR) 40 MG tablet TAKE 1 TABLET EVERY DAY 90 tablet 1  . brimonidine (ALPHAGAN) 0.15 % ophthalmic solution Place 1 drop into the left eye 2 (two) times daily.     . cetirizine (ZYRTEC) 10 MG tablet Take 10 mg by mouth daily.      . Cholecalciferol (VITAMIN D3) 2000 UNITS TABS Take 1 tablet by mouth daily.    Marland Kitchen ENSURE (ENSURE) Take 1 Can by mouth daily.    . fluticasone (FLONASE) 50 MCG/ACT nasal spray     . hyoscyamine (LEVSIN SL) 0.125 MG SL tablet Place 1 tablet (0.125 mg total) under the tongue every 4 (four) hours as needed. 60 tablet 3  . lisinopril (PRINIVIL,ZESTRIL) 40 MG tablet TAKE ONE TABLET BY MOUTH ONCE DAILY 90 tablet 2  . NASAL SPRAY SALINE NA Place 1 spray into the nose as needed.    . nitrofurantoin (MACRODANTIN) 100 MG capsule Take 1 capsule by mouth daily.    Marland Kitchen omeprazole (PRILOSEC) 20 MG capsule TAKE 1 CAPSULE ONE TIME DAILY 90 capsule 3  . Potassium Gluconate ([employer] POTASSIUM GLUCONATE) 2 MEQ TABS Take 1 tablet by mouth daily.     . predniSONE (DELTASONE) 2.5 MG tablet Take 1 tablet by mouth daily.    Marland Kitchen sulfamethoxazole-trimethoprim (BACTRIM DS,SEPTRA DS) 800-160  MG per tablet TAKE 1 TABLET THREE TIMES WEEKLY 39 tablet 3  . trimethoprim (TRIMPEX) 100 MG tablet     . albuterol (PROVENTIL HFA;VENTOLIN HFA) 108 (90 BASE) MCG/ACT inhaler Inhale 2 puffs into the lungs every 6 (six) hours as needed.  Reported on 09/24/2015    . ondansetron (ZOFRAN) 8 MG tablet Take 8 mg by mouth as needed. Reported on 09/24/2015    . polyethylene glycol powder (GLYCOLAX/MIRALAX) powder Take 255 g by mouth once. For procedure (Patient not taking: Reported on 09/24/2015) 238 g 0   No current facility-administered medications for this visit.    PHYSICAL EXAMINATION: ECOG PERFORMANCE STATUS: 1 - Symptomatic but completely ambulatory  Filed Vitals:   09/24/15 0937  BP: 154/55  Pulse: 58  Temp: 97.4 F (36.3 C)  Resp: 18   Filed Weights   09/24/15 0937  Weight: 193 lb 11.2 oz (87.862 kg)    GENERAL:alert, no distress and comfortable. She looks obese and mildly cushingoid SKIN: skin color, texture, turgor are normal, no rashes or significant lesions EYES: normal, Conjunctiva are pink and non-injected, sclera clear OROPHARYNX:no exudate, no erythema and lips, buccal mucosa, and tongue normal  NECK: supple, thyroid normal size, non-tender, without nodularity LYMPH:  no palpable lymphadenopathy in the cervical, axillary or inguinal LUNGS: clear to auscultation and percussion with normal breathing effort HEART: regular rate & rhythm and no murmurs and no lower extremity edema ABDOMEN:abdomen soft, non-tender and normal bowel sounds Musculoskeletal:no cyanosis of digits and no clubbing  NEURO: alert & oriented x 3 with fluent speech, no focal motor/sensory deficits  LABORATORY DATA:  I have reviewed the data as listed    Component Value Date/Time   NA 144 09/24/2015 0917   NA 147* 02/21/2014 1157   K 4.5 09/24/2015 0917   K 3.2* 02/21/2014 1157   CL 107 02/21/2014 1157   CL 105 08/24/2012 1202   CO2 28 09/24/2015 0917   CO2 29 02/21/2014 1157   GLUCOSE 84 09/24/2015 0917   GLUCOSE 106* 02/21/2014 1157   GLUCOSE 110* 08/24/2012 1202   GLUCOSE 88 05/29/2006 1020   BUN 21.1 09/24/2015 0917   BUN 11 02/21/2014 1157   CREATININE 1.1 09/24/2015 0917   CREATININE 0.9 02/21/2014 1157   CALCIUM 9.8  09/24/2015 0917   CALCIUM 8.9 02/21/2014 1157   PROT 7.2 09/24/2015 0917   PROT 5.9* 02/10/2012 1051   ALBUMIN 4.1 09/24/2015 0917   ALBUMIN 3.7 02/10/2012 1051   AST 19 09/24/2015 0917   AST 19 02/10/2012 1051   ALT 20 09/24/2015 0917   ALT 29 02/10/2012 1051   ALKPHOS 124 09/24/2015 0917   ALKPHOS 100 02/10/2012 1051   BILITOT 0.41 09/24/2015 0917   BILITOT 0.3 02/10/2012 1051   GFRNONAA 69* 02/16/2014 1020   GFRAA 80* 02/16/2014 1020    No results found for: SPEP, UPEP  Lab Results  Component Value Date   WBC 6.0 09/24/2015   NEUTROABS 4.2 09/24/2015   HGB 14.0 09/24/2015   HCT 43.4 09/24/2015   MCV 85.1 09/24/2015   PLT 198 09/24/2015      Chemistry      Component Value Date/Time   NA 144 09/24/2015 0917   NA 147* 02/21/2014 1157   K 4.5 09/24/2015 0917   K 3.2* 02/21/2014 1157   CL 107 02/21/2014 1157   CL 105 08/24/2012 1202   CO2 28 09/24/2015 0917   CO2 29 02/21/2014 1157   BUN 21.1 09/24/2015 0917   BUN 11  02/21/2014 1157   CREATININE 1.1 09/24/2015 0917   CREATININE 0.9 02/21/2014 1157      Component Value Date/Time   CALCIUM 9.8 09/24/2015 0917   CALCIUM 8.9 02/21/2014 1157   ALKPHOS 124 09/24/2015 0917   ALKPHOS 100 02/10/2012 1051   AST 19 09/24/2015 0917   AST 19 02/10/2012 1051   ALT 20 09/24/2015 0917   ALT 29 02/10/2012 1051   BILITOT 0.41 09/24/2015 0917   BILITOT 0.3 02/10/2012 1051      ASSESSMENT & PLAN:  Chronic lymphocytic leukemia Her prior treatment of neurosarcoidosis with prednisone and Cytoxan has partially treated CLL. Clinically, she has no signs and symptoms of active CLL I recommend visit in 12 months with history, physical examination only. I recommend she get CBC with differential with her primary care physician once a year, so that I could reduce the need for blood draw monitoring through my office I will see her next year after her visit with PCP  Neurosarcoidosis Clinically, she is doing well. Currently, she is  on low-dose maintenance prednisone. Defer to [facility]        No orders of the defined types were placed in this encounter.   All questions were answered. The patient knows to call the clinic with any problems, questions or concerns. No barriers to learning was detected. I spent 15 minutes counseling the patient face to face. The total time spent in the appointment was 20 minutes and more than 50% was on counseling and review of test results     [facility], Samika Vetsch, MD 09/24/2015 10:14 AM

## 2015-09-24 NOTE — Assessment & Plan Note (Signed)
Her prior treatment of neurosarcoidosis with prednisone and Cytoxan has partially treated CLL. Clinically, she has no signs and symptoms of active CLL I recommend visit in 12 months with history, physical examination only. I recommend she get CBC with differential with her primary care physician once a year, so that I could reduce the need for blood draw monitoring through my office I will see her next year after her visit with PCP

## 2015-09-24 NOTE — Assessment & Plan Note (Signed)
Clinically, she is doing well. Currently, she is on low-dose maintenance prednisone. Defer to [facility]

## 2015-09-24 NOTE — Telephone Encounter (Signed)
per pof to sch pt appt-gave pt copy of avs °

## 2015-09-25 ENCOUNTER — Encounter: Payer: Self-pay | Admitting: Internal Medicine

## 2015-09-25 ENCOUNTER — Ambulatory Visit (AMBULATORY_SURGERY_CENTER): Payer: Medicare Other | Admitting: Internal Medicine

## 2015-09-25 VITALS — BP 142/71 | HR 68 | Temp 97.2°F | Resp 16 | Ht 60.0 in | Wt 191.0 lb

## 2015-09-25 DIAGNOSIS — G4733 Obstructive sleep apnea (adult) (pediatric): Secondary | ICD-10-CM | POA: Diagnosis not present

## 2015-09-25 DIAGNOSIS — R197 Diarrhea, unspecified: Secondary | ICD-10-CM | POA: Diagnosis not present

## 2015-09-25 DIAGNOSIS — K529 Noninfective gastroenteritis and colitis, unspecified: Secondary | ICD-10-CM

## 2015-09-25 DIAGNOSIS — M797 Fibromyalgia: Secondary | ICD-10-CM | POA: Diagnosis not present

## 2015-09-25 DIAGNOSIS — I10 Essential (primary) hypertension: Secondary | ICD-10-CM | POA: Diagnosis not present

## 2015-09-25 MED ORDER — SODIUM CHLORIDE 0.9 % IV SOLN: 500.0000 mL | INTRAVENOUS | Status: DC

## 2015-09-25 NOTE — Patient Instructions (Addendum)
The colonoscopy was ok - no colitis, polyps, ulcers. No obvious cause of diarrhea - I took biopsies and will let you know.  You do have a condition called diverticulosis - common and not usually a problem. Please read the handout provided.  I appreciate the opportunity to care for you. Gatha Mayer, MD, FACG  YOU HAD AN ENDOSCOPIC PROCEDURE TODAY AT [facility]:   Refer to the procedure report that was given to you for any specific questions about what was found during the examination.  If the procedure report does not answer your questions, please call your gastroenterologist to clarify.  If you requested that your care partner not be given the details of your procedure findings, then the procedure report has been included in a sealed envelope for you to review at your convenience later.  YOU SHOULD EXPECT: Some feelings of bloating in the abdomen. Passage of more gas than usual.  Walking can help get rid of the air that was put into your GI tract during the procedure and reduce the bloating. If you had a lower endoscopy (such as a colonoscopy or flexible sigmoidoscopy) you may notice spotting of blood in your stool or on the toilet paper. If you underwent a bowel prep for your procedure, you may not have a normal bowel movement for a few days.  Please Note:  You might notice some irritation and congestion in your nose or some drainage.  This is from the oxygen used during your procedure.  There is no need for concern and it should clear up in a day or so.  SYMPTOMS TO REPORT IMMEDIATELY:   Following lower endoscopy (colonoscopy or flexible sigmoidoscopy):  Excessive amounts of blood in the stool  Significant tenderness or worsening of abdominal pains  Swelling of the abdomen that is new, acute  Fever of 100F or higher   For urgent or emergent issues, a gastroenterologist can be reached at any hour by calling [phone].   DIET: Your first meal following  the procedure should be a small meal and then it is ok to progress to your normal diet. Heavy or fried foods are harder to digest and may make you feel nauseous or bloated.  Likewise, meals heavy in dairy and vegetables can increase bloating.  Drink plenty of fluids but you should avoid alcoholic beverages for 24 hours.  ACTIVITY:  You should plan to take it easy for the rest of today and you should NOT DRIVE or use heavy machinery until tomorrow (because of the sedation medicines used during the test).    FOLLOW UP: Our staff will call the number listed on your records the next business day following your procedure to check on you and address any questions or concerns that you may have regarding the information given to you following your procedure. If we do not reach you, we will leave a message.  However, if you are feeling well and you are not experiencing any problems, there is no need to return our call.  We will assume that you have returned to your regular daily activities without incident.  If any biopsies were taken you will be contacted by phone or by letter within the next 1-3 weeks.  Please call us at [phone] if you have not heard about the biopsies in 3 weeks.    SIGNATURES/CONFIDENTIALITY: You and/or your care partner have signed paperwork which will be entered into your electronic medical record.  These signatures attest to  the fact that that the information above on your After Visit Summary has been reviewed and is understood.  Full responsibility of the confidentiality of this discharge information lies with you and/or your care-partner.  Diverticulosis handout given Await pathology results Resume diet and medications

## 2015-09-25 NOTE — Progress Notes (Signed)
Called to room to assist during endoscopic procedure.  Patient ID and intended procedure confirmed with present staff. Received instructions for my participation in the procedure from the performing physician.  

## 2015-09-25 NOTE — Progress Notes (Signed)
Report to PACU, RN, vss, BBS= Clear.  

## 2015-09-25 NOTE — Op Note (Signed)
[place] Patient Name: Sheryl [name] Procedure Date: 09/25/2015 8:03 AM MRN: [id number] Endoscopist: Gatha Mayer , MD Age: 70 Referring MD:  Date of Birth: [birth year] Gender: Female Procedure:                Colonoscopy Indications:              Clinically significant diarrhea of unexplained                            origin Medicines:                Propofol per Anesthesia, Monitored Anesthesia Care Procedure:                Pre-Anesthesia Assessment:                           - Prior to the procedure, a History and Physical                            was performed, and patient medications and                            allergies were reviewed. The patient's tolerance of                            previous anesthesia was also reviewed. The risks                            and benefits of the procedure and the sedation                            options and risks were discussed with the patient.                            All questions were answered, and informed consent                            was obtained. Prior Anticoagulants: The patient has                            taken no previous anticoagulant or antiplatelet                            agents. ASA Grade Assessment: III - A patient with                            severe systemic disease. After reviewing the risks                            and benefits, the patient was deemed in                            satisfactory condition to undergo the procedure.  After obtaining informed consent, the colonoscope                            was passed under direct vision. Throughout the                            procedure, the patient's blood pressure, pulse, and                            oxygen saturations were monitored continuously. The                            Model CF-HQ190L [id number]) scope was introduced                            through the anus and advanced to the the  cecum,                            identified by appendiceal orifice and ileocecal                            valve. The colonoscopy was performed without                            difficulty. The patient tolerated the procedure                            well. The quality of the bowel preparation was                            good. The bowel preparation used was Miralax. The                            ileocecal valve, appendiceal orifice, and rectum                            were photographed. Scope In: 8:16:08 AM Scope Out: 8:31:48 AM Scope Withdrawal Time: 0 hours 11 minutes 12 seconds  Total Procedure Duration: 0 hours 15 minutes 40 seconds  Findings:      The perianal and digital rectal examinations were normal.      Diverticula were found in the sigmoid colon.      Normal mucosa was found in the entire colon. Biopsies for histology were       taken with a cold forceps from the right colon, left colon and rectum       for evaluation of microscopic colitis. Verification of patient       identification for the specimen was done. Estimated blood loss: none.      The exam was otherwise without abnormality on direct and retroflexion       views. Complications:            No immediate complications. Estimated Blood Loss:     Estimated blood loss: none. Estimated blood loss:  none. Impression:               - Moderate diverticulosis in the sigmoid colon.                           - The examination was otherwise normal on direct                            and retroflexion views.                           - No specimens collected. Recommendation:           - Patient has a contact number available for                            emergencies. The signs and symptoms of potential                            delayed complications were discussed with the                            patient. Return to normal activities tomorrow.                            Written  discharge instructions were provided to the                            patient.                           - Resume previous diet.                           - Continue present medications.                           - Await pathology results.                           - No repeat colonoscopy due to age. Procedure Code(s):        --- Professional ---                           [phone], Colonoscopy, flexible; with biopsy, single                            or multiple CPT copyright 2016 [employer]. All rights reserved. Gatha Mayer, MD 09/25/2015 8:38:38 AM This report has been signed electronically. Number of Addenda: 0 Referring MD:      Marletta Lor

## 2015-09-28 ENCOUNTER — Telehealth: Payer: Self-pay

## 2015-09-28 NOTE — Telephone Encounter (Signed)
  Follow up Call-  Call back number 09/25/2015  Post procedure Call Back phone  # [phone]  Permission to leave phone message Yes    Patient was called for follow up after her procedure on 09/25/2015. No answer at the number given for follow up phone call. A message was left on her answering machine.

## 2015-09-30 ENCOUNTER — Encounter: Payer: Self-pay | Admitting: Internal Medicine

## 2015-09-30 ENCOUNTER — Ambulatory Visit (INDEPENDENT_AMBULATORY_CARE_PROVIDER_SITE_OTHER): Payer: Medicare Other | Admitting: Internal Medicine

## 2015-09-30 VITALS — BP 136/80 | HR 65 | Temp 98.4°F | Resp 20 | Ht 60.0 in | Wt 189.0 lb

## 2015-09-30 DIAGNOSIS — C9110 Chronic lymphocytic leukemia of B-cell type not having achieved remission: Secondary | ICD-10-CM

## 2015-09-30 DIAGNOSIS — I10 Essential (primary) hypertension: Secondary | ICD-10-CM

## 2015-09-30 DIAGNOSIS — M150 Primary generalized (osteo)arthritis: Secondary | ICD-10-CM | POA: Diagnosis not present

## 2015-09-30 DIAGNOSIS — M8949 Other hypertrophic osteoarthropathy, multiple sites: Secondary | ICD-10-CM

## 2015-09-30 DIAGNOSIS — D8689 Sarcoidosis of other sites: Secondary | ICD-10-CM | POA: Diagnosis not present

## 2015-09-30 DIAGNOSIS — Z299 Encounter for prophylactic measures, unspecified: Secondary | ICD-10-CM | POA: Diagnosis not present

## 2015-09-30 DIAGNOSIS — M159 Polyosteoarthritis, unspecified: Secondary | ICD-10-CM

## 2015-09-30 DIAGNOSIS — E785 Hyperlipidemia, unspecified: Secondary | ICD-10-CM

## 2015-09-30 MED ORDER — HYOSCYAMINE SULFATE 0.125 MG SL SUBL: 0.1250 mg | SUBLINGUAL_TABLET | SUBLINGUAL | Status: DC | PRN

## 2015-09-30 MED ORDER — ATORVASTATIN CALCIUM 40 MG PO TABS: 40.0000 mg | ORAL_TABLET | Freq: Every day | ORAL | Status: DC

## 2015-09-30 NOTE — Progress Notes (Signed)
Subjective:    Patient ID: Sheryl [name], female    DOB: November 30, 1945, 70 y.o.   MRN: [id number]  HPI 33  -year-old patient who is seen today for a preventive health examination.  She is being followed locally by rheumatology and also at [facility] due to neurosarcoidosis. She has a history of cervical and lumbar spinal stenosis and is status post laminectomy and fusions.  She has a history of dyslipidemia and treated hypertension. She has stable CLL, which has been partially treated with recent Cytoxan therapy and also a history of osteoarthritis;  additionally she has obesity. She has a history of right optic neuropathy. She continues to be followed by multiple consultants. She has a history of steroid myopathy and her present dose is 5 mg with further plans to taper. She has had a recent colonoscopy for evaluation of chronic diarrhea that was noncontributory.  Her diarrhea has resolved    Zoster vaccination not recommended  Current Allergies:  No known allergies   Past Medical History:   Hyperlipidemia  Hypertension  Lymphocytic leukemia  CNS sarcoid w/secondary gait disturbance, partial blindness Fibromyalgia  Obesity  DJD   Past Surgical History:   Cholecystectomy  Tonsillectomy  colonoscopy June 2006  08/2015  Family History:   father died age 16. History of COPD, dementia  mother history of hypertension , osteoarthritis  One sister in good health   Social History:  And a Married  Regular exercise-no  no children   1. Risk factors, based on past  M,S,F history-  cardiovascular risk factors include hypertension and dyslipidemia  2.  Physical activities: Very sedentary do to obesity and osteoarthritis  3.  Depression/mood: No history depression or mood disorder  4.  Hearing: No deficits  5.  ADL's: Require some assistance with all aspects of daily living do to gait instability  6.  Fall risk: High due to gait instability  7.  Home safety: No problems  identified  8.  Height weight, and visual acuity; decreased visual acuity on the l right due to optic neuropathy  9.  Counseling: Weight loss encouraged heart healthy diet encouraged  10. Lab orders based on risk factors: Will check a lipid profile  11. Referral : Followup the [facility] neurosurgery and locally with rheumatology  12. Care plan: Modest weight loss encouraged  13. Cognitive assessment: Alert and oriented with normal affect. No cognitive dysfunction  14.  preventive services will include annual health examinations, with periodic mammograms.  She is followed closely by rheumatology and ophthalmology. Patient was provided with a written and personalized care plan Influenza and Pneumovax administered Mammogram to be scheduled  15.  Provider list includes ophthalmology, neurology, rheumatology, and primary care; also, hematology  GI and radiology  Past Medical History  Diagnosis Date  . 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 DEGENERATIVE JOINT DISEASE 12/14/2006  . EXOGENOUS OBESITY 12/14/2006  . HYPERLIPIDEMIA 12/14/2006  . HYPERTENSION 12/14/2006  . Neurosarcoidosis ([place])   . FIBROMYALGIA 12/14/2006  . Hearing loss of left ear   . Vision loss of right eye   . IBS (irritable bowel syndrome)   . Gallstones   . Allergy   . Cataract   . Sleep apnea     no CPAP    Social History   Social History  . Marital Status: Married    Spouse Name: N/A  . Number of Children: 0  . Years of Education: hs   Occupational History  . disabled   . clerical  Social History Main Topics  . Smoking status: Never Smoker   . Smokeless tobacco: Never Used  . Alcohol Use: 0.0 oz/week    0 Standard drinks or equivalent per week     Comment: One drink per year  . Drug Use: No  . Sexual Activity: Not on file   Other Topics Concern  . Not on file   Social History Narrative   Married no children 2 caffeinated beverages a day    Past Surgical History  Procedure Laterality Date  .  Cholecystectomy    . Tonsillectomy    . Lumbar fusion  07/25/2012  . Brain biopsy      Loleta Books  . Cervical fusion    . Colonoscopy      Family History  Problem Relation Age of Onset  . Diabetes Sister   . Hypertension Mother   . Emphysema Father   . Colon cancer Neg Hx   . Esophageal cancer Neg Hx   . Rectal cancer Neg Hx   . Stomach cancer Neg Hx     Allergies  Allergen Reactions  . Adhesive [Tape] Rash  . Cyclophosphamide Other (See Comments)    Caused diverticulitis.  She would not eat or drink for days, while taking it.    Current Outpatient Prescriptions on File Prior to Visit  Medication Sig Dispense Refill  . albuterol (PROVENTIL HFA;VENTOLIN HFA) 108 (90 BASE) MCG/ACT inhaler Inhale 2 puffs into the lungs every 6 (six) hours as needed. Reported on 09/24/2015    . aspirin 81 MG tablet Take 81 mg by mouth daily.      . brimonidine (ALPHAGAN) 0.15 % ophthalmic solution Place 1 drop into the left eye 2 (two) times daily.     . cetirizine (ZYRTEC) 10 MG tablet Take 10 mg by mouth daily.      . Cholecalciferol (VITAMIN D3) 2000 UNITS TABS Take 1 tablet by mouth daily.    Marland Kitchen ENSURE (ENSURE) Take 1 Can by mouth daily.    . fluticasone (FLONASE) 50 MCG/ACT nasal spray     . lisinopril (PRINIVIL,ZESTRIL) 40 MG tablet TAKE ONE TABLET BY MOUTH ONCE DAILY 90 tablet 2  . NASAL SPRAY SALINE NA Place 1 spray into the nose as needed.    . nitrofurantoin (MACRODANTIN) 100 MG capsule Take 1 capsule by mouth daily.    Marland Kitchen omeprazole (PRILOSEC) 20 MG capsule TAKE 1 CAPSULE ONE TIME DAILY 90 capsule 3  . ondansetron (ZOFRAN) 8 MG tablet Take 8 mg by mouth as needed. Reported on 09/24/2015    . Potassium Gluconate ([employer] POTASSIUM GLUCONATE) 2 MEQ TABS Take 1 tablet by mouth daily.     . predniSONE (DELTASONE) 2.5 MG tablet Take 1 tablet by mouth daily.    Marland Kitchen sulfamethoxazole-trimethoprim (BACTRIM DS,SEPTRA DS) 800-160 MG per tablet TAKE 1 TABLET THREE TIMES WEEKLY 39 tablet 3  . trimethoprim  (TRIMPEX) 100 MG tablet      No current facility-administered medications on file prior to visit.    BP 136/80 mmHg  Pulse 65  Temp(Src) 98.4 F (36.9 C) (Oral)  Resp 20  Ht 5' (1.524 m)  Wt 189 lb (85.73 kg)  BMI 36.91 kg/m2  SpO2 98%       Review of Systems  Constitutional: Negative for fever, appetite change, fatigue and unexpected weight change.  HENT: Negative for congestion, dental problem, ear pain, hearing loss, mouth sores, nosebleeds, sinus pressure, sore throat, tinnitus, trouble swallowing and voice change.   Eyes: Positive for  visual disturbance. Negative for photophobia, pain and redness.  Respiratory: Negative for cough, chest tightness and shortness of breath.   Cardiovascular: Negative for chest pain, palpitations and leg swelling.  Gastrointestinal: Negative for nausea, vomiting, abdominal pain, diarrhea, constipation, blood in stool, abdominal distention and rectal pain.  Genitourinary: Negative for dysuria, urgency, frequency, hematuria, flank pain, vaginal bleeding, vaginal discharge, difficulty urinating, genital sores, vaginal pain, menstrual problem and pelvic pain.  Musculoskeletal: Positive for back pain, arthralgias and gait problem. Negative for neck stiffness.  Skin: Negative for rash.  Neurological: Negative for dizziness, syncope, speech difficulty, weakness, light-headedness, numbness and headaches.  Hematological: Negative for adenopathy. Does not bruise/bleed easily.  Psychiatric/Behavioral: Negative for suicidal ideas, behavioral problems, self-injury, dysphoric mood and agitation. The patient is not nervous/anxious.        Objective:   Physical Exam  Constitutional: She is oriented to person, place, and time. She appears well-developed and well-nourished.  HENT:  Head: Normocephalic and atraumatic.  Right Ear: External ear normal.  Left Ear: External ear normal.  Mouth/Throat: Oropharynx is clear and moist.  Minimal cerumen in both  canals  Pharyngeal crowding with low hanging soft palate  Eyes: Conjunctivae and EOM are normal.  Mild anisocoria  with the right pupil dilated and only weakly reactive Light perception only.  Right eye  Neck: Normal range of motion. Neck supple. No JVD present. No thyromegaly present.  Cardiovascular: Normal rate, regular rhythm, normal heart sounds and intact distal pulses.   No murmur heard. Pulmonary/Chest: Effort normal and breath sounds normal. She has no wheezes. She has no rales.  Abdominal: Soft. Bowel sounds are normal. She exhibits no distension and no mass. There is no tenderness. There is no rebound and no guarding.  Genitourinary: Vagina normal.  Musculoskeletal: Normal range of motion. She exhibits no edema or tenderness.  Generalized muscle weakness.  Unable to stand from a sitting position unassisted  Neurological: She is alert and oriented to person, place, and time. She displays abnormal reflex. No cranial nerve deficit. She exhibits normal muscle tone. Coordination normal.  Reflexes were hyperreflexic with ankle clonus  Skin: Skin is warm and dry. No rash noted.  Psychiatric: She has a normal mood and affect. Her behavior is normal.          Assessment & Plan:   Preventive health examination History of cervical and lumbar spinal stenosis.  Status post laminectomies and fusion  Fibromyalgia History of neurosarcoidosis Hypertension stable Dyslipidemia. We'll check a lipid profile   Optic neuropathy, right eye Steroid myopathy.  Schedule bone density study.  Followup rheumatology CLL.  Partially treated following Cytoxan therapy with resolution of leukocytosis presently    Recheck 6 months

## 2015-09-30 NOTE — Patient Instructions (Signed)
Limit your sodium (Salt) intake  You need to lose weight.  Consider a lower calorie diet and regular exercise.  Schedule your mammogram.  Return in one year for follow-up

## 2015-09-30 NOTE — Progress Notes (Signed)
Pre visit review using our clinic review tool, if applicable. No additional management support is needed unless otherwise documented below in the visit note. 

## 2015-09-30 NOTE — Progress Notes (Signed)
Quick Note:  Call from office - biopsies ok - no colitis I believe she was slowly improving - I hope that continues - loperamide prn (Imodium AD) is ok to use If that is not working or she is not improving let me know  [facility] - no recall or letter ______

## 2015-10-05 ENCOUNTER — Telehealth: Payer: Self-pay | Admitting: General Practice

## 2015-10-05 ENCOUNTER — Encounter: Payer: Self-pay | Admitting: General Practice

## 2015-10-05 NOTE — Telephone Encounter (Signed)
PA began on covermymeds for Hycosamine. (Key #[id number])

## 2015-10-08 DIAGNOSIS — N281 Cyst of kidney, acquired: Secondary | ICD-10-CM | POA: Diagnosis not present

## 2015-10-08 DIAGNOSIS — N3020 Other chronic cystitis without hematuria: Secondary | ICD-10-CM | POA: Diagnosis not present

## 2015-10-08 DIAGNOSIS — N3941 Urge incontinence: Secondary | ICD-10-CM | POA: Diagnosis not present

## 2015-10-15 ENCOUNTER — Ambulatory Visit: Payer: Medicare Other | Admitting: Internal Medicine

## 2015-11-05 DIAGNOSIS — N3941 Urge incontinence: Secondary | ICD-10-CM | POA: Diagnosis not present

## 2015-11-05 DIAGNOSIS — Z0000 Encounter for general adult medical examination without abnormal findings: Secondary | ICD-10-CM | POA: Diagnosis not present

## 2015-11-30 DIAGNOSIS — R300 Dysuria: Secondary | ICD-10-CM | POA: Diagnosis not present

## 2015-11-30 DIAGNOSIS — N3020 Other chronic cystitis without hematuria: Secondary | ICD-10-CM | POA: Diagnosis not present

## 2016-01-07 ENCOUNTER — Emergency Department (HOSPITAL_COMMUNITY)
Admission: EM | Admit: 2016-01-07 | Discharge: 2016-01-07 | Disposition: A | Discharge status: Home / Self Care | Payer: Medicare Other | Attending: Emergency Medicine | Admitting: Emergency Medicine

## 2016-01-07 ENCOUNTER — Emergency Department (HOSPITAL_COMMUNITY): Payer: Medicare Other

## 2016-01-07 ENCOUNTER — Encounter (HOSPITAL_COMMUNITY): Payer: Self-pay | Admitting: Emergency Medicine

## 2016-01-07 DIAGNOSIS — I10 Essential (primary) hypertension: Secondary | ICD-10-CM | POA: Diagnosis not present

## 2016-01-07 DIAGNOSIS — R51 Headache: Secondary | ICD-10-CM | POA: Diagnosis not present

## 2016-01-07 DIAGNOSIS — Z7982 Long term (current) use of aspirin: Secondary | ICD-10-CM | POA: Diagnosis not present

## 2016-01-07 DIAGNOSIS — Z79899 Other long term (current) drug therapy: Secondary | ICD-10-CM | POA: Insufficient documentation

## 2016-01-07 DIAGNOSIS — R519 Headache, unspecified: Secondary | ICD-10-CM

## 2016-01-07 LAB — BASIC METABOLIC PANEL
Anion gap: 9 (ref 5–15)
BUN: 20 mg/dL (ref 6–20)
CO2: 24 mmol/L (ref 22–32)
Calcium: 9.3 mg/dL (ref 8.9–10.3)
Chloride: 108 mmol/L (ref 101–111)
Creatinine, Ser: 0.95 mg/dL (ref 0.44–1.00)
GFR calc Af Amer: >60 mL/min (ref >60)
GFR calc non Af Amer: >60 mL/min (ref >60)
Glucose, Bld: 93 mg/dL (ref 65–99)
Potassium: 4 mmol/L (ref 3.5–5.1)
Sodium: 141 mmol/L (ref 135–145)

## 2016-01-07 LAB — CBC
HCT: 44.6 % (ref 36.0–46.0)
Hemoglobin: 14.6 g/dL (ref 12.0–15.0)
MCH: 29.1 pg (ref 26.0–34.0)
MCHC: 32.7 g/dL (ref 30.0–36.0)
MCV: 88.8 fL (ref 78.0–100.0)
Platelets: 159 10*3/uL (ref 150–400)
RBC: 5.02 MIL/uL (ref 3.87–5.11)
RDW: 14.3 % (ref 11.5–15.5)
WBC: 7.2 10*3/uL (ref 4.0–10.5)

## 2016-01-07 NOTE — Discharge Instructions (Signed)
As discussed, your evaluation today has been largely reassuring.  But, it is important that you monitor your condition carefully, and do not hesitate to return to the ED if you develop new, or concerning changes in your condition. ? ?Otherwise, please follow-up with your physician for appropriate ongoing care. ? ?

## 2016-01-07 NOTE — ED Notes (Addendum)
Pt was at hairdressers this am and had "a sharp stabbing pain" that went from her neck to back of head. States she is in no pain at present, pain lasted briefly. No LOC- "just doesn't feel right"

## 2016-01-07 NOTE — ED Provider Notes (Signed)
CSN: [id number]     Arrival date & time 01/07/16  1129 History   First MD Initiated Contact with Patient 01/07/16 1306     Chief Complaint  Patient presents with  . Headache    HPI  Patient presents after a brief headache. Currently she has no complaint. She notes that she was in her usual state of health. While at the hairdresser, she felt the sudden onset of sharp pain from her occiput radiating anteriorly. There is mild associated generalized discomfort, but after about 30 seconds the headache, all symptoms resolved. She denies recent changes in her health condition, medication, diet, activity. She states that she is generally well.   Past Medical History  Diagnosis Date  . 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 DEGENERATIVE JOINT DISEASE 12/14/2006  . EXOGENOUS OBESITY 12/14/2006  . HYPERLIPIDEMIA 12/14/2006  . HYPERTENSION 12/14/2006  . Neurosarcoidosis ([place])   . FIBROMYALGIA 12/14/2006  . Hearing loss of left ear   . Vision loss of right eye   . IBS (irritable bowel syndrome)   . Gallstones   . Allergy   . Cataract   . Sleep apnea     no CPAP   Past Surgical History  Procedure Laterality Date  . Cholecystectomy    . Tonsillectomy    . Lumbar fusion  07/25/2012  . Brain biopsy      Loleta Books  . Cervical fusion    . Colonoscopy     Family History  Problem Relation Age of Onset  . Diabetes Sister   . Hypertension Mother   . Emphysema Father   . Colon cancer Neg Hx   . Esophageal cancer Neg Hx   . Rectal cancer Neg Hx   . Stomach cancer Neg Hx    Social History  Substance Use Topics  . Smoking status: Never Smoker   . Smokeless tobacco: Never Used  . Alcohol Use: No     Comment: One drink per year   OB History    No data available     Review of Systems  Constitutional:       Per HPI, otherwise negative  HENT:       Per HPI, otherwise negative  Respiratory:       Per HPI, otherwise negative  Cardiovascular:       Per HPI, otherwise negative  Gastrointestinal:  Negative for vomiting.  Endocrine:       Negative aside from HPI  Genitourinary:       Neg aside from HPI   Musculoskeletal:       Per HPI, otherwise negative  Skin: Negative.   Neurological: Positive for headaches. Negative for syncope.      Allergies  Adhesive and Cyclophosphamide  Home Medications   Prior to Admission medications   Medication Sig Start Date End Date Taking? Authorizing Provider  albuterol (PROVENTIL HFA;VENTOLIN HFA) 108 (90 BASE) MCG/ACT inhaler Inhale 2 puffs into the lungs every 6 (six) hours as needed. Reported on 09/24/2015 01/23/15 01/23/16  Historical Provider, MD  aspirin 81 MG tablet Take 81 mg by mouth daily.      Historical Provider, MD  atorvastatin (LIPITOR) 40 MG tablet Take 1 tablet (40 mg total) by mouth daily. 09/30/15   Marletta Lor, MD  brimonidine (ALPHAGAN) 0.15 % ophthalmic solution Place 1 drop into the left eye 2 (two) times daily.  09/03/14   Historical Provider, MD  cetirizine (ZYRTEC) 10 MG tablet Take 10 mg by mouth daily.      Historical  Provider, MD  Cholecalciferol (VITAMIN D3) 2000 UNITS TABS Take 1 tablet by mouth daily.    Historical Provider, MD  ENSURE (ENSURE) Take 1 Can by mouth daily.    Historical Provider, MD  fluticasone Asencion Islam) 50 MCG/ACT nasal spray  10/16/12   Historical Provider, MD  hyoscyamine (LEVSIN SL) 0.125 MG SL tablet Place 1 tablet (0.125 mg total) under the tongue every 4 (four) hours as needed. 09/30/15   Marletta Lor, MD  lisinopril (PRINIVIL,ZESTRIL) 40 MG tablet TAKE ONE TABLET BY MOUTH ONCE DAILY 04/14/15   Marletta Lor, MD  NASAL SPRAY SALINE NA Place 1 spray into the nose as needed.    Historical Provider, MD  nitrofurantoin (MACRODANTIN) 100 MG capsule Take 1 capsule by mouth daily. 08/12/15   Historical Provider, MD  omeprazole (PRILOSEC) 20 MG capsule TAKE 1 CAPSULE ONE TIME DAILY 01/14/15   Marletta Lor, MD  ondansetron (ZOFRAN) 8 MG tablet Take 8 mg by mouth as needed. Reported  on 09/24/2015 06/27/13   Historical Provider, MD  Potassium Gluconate ([employer]S POTASSIUM GLUCONATE) 2 MEQ TABS Take 1 tablet by mouth daily.     Historical Provider, MD  predniSONE (DELTASONE) 2.5 MG tablet Take 1 tablet by mouth daily. 04/19/14   Historical Provider, MD  sulfamethoxazole-trimethoprim (BACTRIM DS,SEPTRA DS) 800-160 MG per tablet TAKE 1 TABLET THREE TIMES WEEKLY 01/14/15   Marletta Lor, MD  trimethoprim (TRIMPEX) 100 MG tablet  08/26/15   Historical Provider, MD   BP 163/83 mmHg  Pulse 91  Temp(Src) 97.7 F (36.5 C) (Oral)  Resp 17  Ht 5' (1.524 m)  Wt 198 lb (89.812 kg)  BMI 38.67 kg/m2  SpO2 99% Physical Exam  Constitutional: She is oriented to person, place, and time. She appears well-developed and well-nourished. No distress.  HENT:  Head: Normocephalic and atraumatic.  Eyes: Conjunctivae and EOM are normal.  Neck:    Cardiovascular: Normal rate and regular rhythm.   Pulmonary/Chest: Effort normal and breath sounds normal. No stridor. No respiratory distress.  Abdominal: She exhibits no distension.  Musculoskeletal: She exhibits no edema.  Neurological: She is alert and oriented to person, place, and time. She displays no tremor. No cranial nerve deficit or sensory deficit. She exhibits normal muscle tone. She displays no seizure activity. Coordination normal.  Skin: Skin is warm and dry.  Psychiatric: She has a normal mood and affect.  Nursing note and vitals reviewed.   ED Course  Procedures (including critical care time) Labs Review Labs Reviewed  CBC  BASIC METABOLIC PANEL    Imaging Review Ct Head Wo Contrast  01/07/2016  CLINICAL DATA:  Left side headache, hypertension EXAM: CT HEAD WITHOUT CONTRAST TECHNIQUE: Contiguous axial images were obtained from the base of the skull through the vertex without intravenous contrast. COMPARISON:  Report brain MRI 09/22/1999 no images available FINDINGS: No skull fracture is noted. There is probable skull defect  and sequela from prior craniotomy right temporal region see axial image 9, 10 and 11. No intracranial hemorrhage, mass effect or midline shift. Moderate cerebral atrophy. No acute cortical infarction. No mass lesion is noted on this unenhanced scan. The gray and white-matter differentiation is preserved. No intra or extra-axial fluid collection. Some encephalomalacia is noted in right temporal lobe peripheral in. IMPRESSION: 1. Probable postsurgical changes in right temporal lobe with small peripheral encephalomalacia. Please see axial image 11. No intracranial hemorrhage, mass effect or midline shift. No acute cortical infarction. No mass lesion is noted on this unenhanced  scan. Electronically Signed   By: Lahoma Crocker M.D.   On: 01/07/2016 14:24   I have personally reviewed and evaluated these images and lab results as part of my medical decision-making.   EKG Interpretation   Date/Time:  Thursday January 07 2016 13:46:09 EDT Ventricular Rate:  67 PR Interval:    QRS Duration: 93 QT Interval:  427 QTC Calculation: 451 R Axis:   -12 Text Interpretation:  Sinus rhythm Low voltage, extremity and precordial  leads Borderline ECG Confirmed by Carmin Muskrat  MD ([id number]) on 01/07/2016  2:08:14 PM     On repeat exam the patient is in no distress. We discussed all findings.  Patient will follow-up with primary care, and neurology. MDM  Patient presents after brief headache. Here she is awake and alert, with no complaints, nor any focal neurologic deficits. Evaluation here is not demonstrate acute stroke, nor other acute findings in the rain. With no change after monitoring, no notable findings on physical exam, there is low suspicion for acute new pathology, including CNS disruption. Patient discharged in stable condition to follow-up with primary care and neurology.  Carmin Muskrat, MD 01/07/16 1537

## 2016-01-20 DIAGNOSIS — R350 Frequency of micturition: Secondary | ICD-10-CM | POA: Diagnosis not present

## 2016-01-20 DIAGNOSIS — N3944 Nocturnal enuresis: Secondary | ICD-10-CM | POA: Diagnosis not present

## 2016-01-20 DIAGNOSIS — N3020 Other chronic cystitis without hematuria: Secondary | ICD-10-CM | POA: Diagnosis not present

## 2016-01-27 DIAGNOSIS — H5451 Low vision, right eye, normal vision left eye: Secondary | ICD-10-CM | POA: Diagnosis not present

## 2016-01-27 DIAGNOSIS — H6122 Impacted cerumen, left ear: Secondary | ICD-10-CM | POA: Diagnosis not present

## 2016-01-27 DIAGNOSIS — H9042 Sensorineural hearing loss, unilateral, left ear, with unrestricted hearing on the contralateral side: Secondary | ICD-10-CM | POA: Diagnosis not present

## 2016-01-27 DIAGNOSIS — J343 Hypertrophy of nasal turbinates: Secondary | ICD-10-CM | POA: Diagnosis not present

## 2016-02-04 DIAGNOSIS — H903 Sensorineural hearing loss, bilateral: Secondary | ICD-10-CM | POA: Diagnosis not present

## 2016-02-09 DIAGNOSIS — H469 Unspecified optic neuritis: Secondary | ICD-10-CM | POA: Diagnosis not present

## 2016-02-09 DIAGNOSIS — D8689 Sarcoidosis of other sites: Secondary | ICD-10-CM | POA: Diagnosis not present

## 2016-02-24 DIAGNOSIS — N3941 Urge incontinence: Secondary | ICD-10-CM | POA: Diagnosis not present

## 2016-02-24 DIAGNOSIS — Z7982 Long term (current) use of aspirin: Secondary | ICD-10-CM | POA: Diagnosis not present

## 2016-02-24 DIAGNOSIS — R350 Frequency of micturition: Secondary | ICD-10-CM | POA: Diagnosis not present

## 2016-02-24 DIAGNOSIS — R0682 Tachypnea, not elsewhere classified: Secondary | ICD-10-CM | POA: Diagnosis not present

## 2016-02-24 DIAGNOSIS — R351 Nocturia: Secondary | ICD-10-CM | POA: Diagnosis not present

## 2016-02-24 DIAGNOSIS — M797 Fibromyalgia: Secondary | ICD-10-CM | POA: Diagnosis not present

## 2016-02-24 DIAGNOSIS — R05 Cough: Secondary | ICD-10-CM | POA: Diagnosis not present

## 2016-02-24 DIAGNOSIS — I10 Essential (primary) hypertension: Secondary | ICD-10-CM | POA: Diagnosis not present

## 2016-02-24 DIAGNOSIS — R0602 Shortness of breath: Secondary | ICD-10-CM | POA: Diagnosis not present

## 2016-02-24 DIAGNOSIS — K219 Gastro-esophageal reflux disease without esophagitis: Secondary | ICD-10-CM | POA: Diagnosis not present

## 2016-02-24 DIAGNOSIS — R42 Dizziness and giddiness: Secondary | ICD-10-CM | POA: Diagnosis not present

## 2016-02-25 DIAGNOSIS — R05 Cough: Secondary | ICD-10-CM | POA: Diagnosis not present

## 2016-03-11 ENCOUNTER — Ambulatory Visit (INDEPENDENT_AMBULATORY_CARE_PROVIDER_SITE_OTHER): Payer: Medicare Other

## 2016-03-11 ENCOUNTER — Encounter: Payer: Self-pay | Admitting: Podiatry

## 2016-03-11 ENCOUNTER — Other Ambulatory Visit: Payer: Self-pay | Admitting: *Deleted

## 2016-03-11 ENCOUNTER — Ambulatory Visit (INDEPENDENT_AMBULATORY_CARE_PROVIDER_SITE_OTHER): Payer: Medicare Other | Admitting: Podiatry

## 2016-03-11 VITALS — BP 111/57 | HR 92 | Resp 14 | Ht 60.0 in | Wt 189.0 lb

## 2016-03-11 DIAGNOSIS — M6740 Ganglion, unspecified site: Secondary | ICD-10-CM

## 2016-03-11 DIAGNOSIS — R52 Pain, unspecified: Secondary | ICD-10-CM

## 2016-03-11 DIAGNOSIS — M779 Enthesopathy, unspecified: Secondary | ICD-10-CM

## 2016-03-11 MED ORDER — METHYLPREDNISOLONE 4 MG PO TBPK: ORAL_TABLET | ORAL | 0 refills | Status: DC

## 2016-03-12 NOTE — Progress Notes (Signed)
This patient presents to the office with severely red and painful big toe joint right foot.  She says this area has become increasing painful and red for the last week.  She has pain upon touching this site and she can only wear house shoes.  She said she had similar problem and was treated by Dr. Paulla Dolly with injection therapy.  She says this improved but the pain due to her knot has returned.  She has now been applying heat to the area and taking tramadol but the pain is worsening.  She presents for evaluation and treatment..   GENERAL APPEARANCE: Alert, conversant. Appropriately groomed. No acute distress.  VASCULAR: Pedal pulses are  palpable at  [facility] and PT bilateral.  Capillary refill time is immediate to all digits,  Normal temperature gradient.  Digital hair growth is present bilateral  NEUROLOGIC: sensation is normal to 5.07 monofilament at 5/5 sites bilateral.  Light touch is intact bilateral, Muscle strength normal.  MUSCULOSKELETAL: acceptable muscle strength, tone and stability bilateral.  Intrinsic muscluature intact bilateral.  Rectus appearance of foot and digits noted bilateral. There is redness ands swelling over dorsomedial exostosis right foot.  There appears to unknown STM.  DERMATOLOGIC: skin color, texture, and turgor are within normal limits.  No preulcerative lesions or ulcers  are seen, no interdigital maceration noted.  No open lesions present.  Digital nails are asymptomatic. No drainage noted.  Inflammatory capsulitis 1st MPJ  Right foot.  ROV  Discussed condition with patient.  This appears to be gout but it is localized.  Her pain is medially therefore capsulitis is better diagnosis.  Prescribed Medrol dosepak after examination of her xrays revealed no pathology.  RTC 1 week.   Gardiner Barefoot DPM

## 2016-03-17 ENCOUNTER — Ambulatory Visit: Payer: Medicare Other | Admitting: Podiatry

## 2016-04-19 ENCOUNTER — Encounter (HOSPITAL_COMMUNITY): Payer: Self-pay

## 2016-04-19 ENCOUNTER — Emergency Department (HOSPITAL_COMMUNITY)
Admission: EM | Admit: 2016-04-19 | Discharge: 2016-04-19 | Disposition: A | Discharge status: Home / Self Care | Payer: Medicare Other | Attending: Dermatology | Admitting: Dermatology

## 2016-04-19 DIAGNOSIS — Z5321 Procedure and treatment not carried out due to patient leaving prior to being seen by health care provider: Secondary | ICD-10-CM | POA: Diagnosis not present

## 2016-04-19 DIAGNOSIS — M79601 Pain in right arm: Secondary | ICD-10-CM | POA: Insufficient documentation

## 2016-04-19 DIAGNOSIS — W228XXA Striking against or struck by other objects, initial encounter: Secondary | ICD-10-CM | POA: Diagnosis not present

## 2016-04-19 DIAGNOSIS — Y929 Unspecified place or not applicable: Secondary | ICD-10-CM | POA: Diagnosis not present

## 2016-04-19 DIAGNOSIS — Z79899 Other long term (current) drug therapy: Secondary | ICD-10-CM | POA: Diagnosis not present

## 2016-04-19 DIAGNOSIS — Z7982 Long term (current) use of aspirin: Secondary | ICD-10-CM | POA: Insufficient documentation

## 2016-04-19 DIAGNOSIS — Y9301 Activity, walking, marching and hiking: Secondary | ICD-10-CM | POA: Diagnosis not present

## 2016-04-19 DIAGNOSIS — M549 Dorsalgia, unspecified: Secondary | ICD-10-CM | POA: Diagnosis not present

## 2016-04-19 DIAGNOSIS — Y999 Unspecified external cause status: Secondary | ICD-10-CM | POA: Insufficient documentation

## 2016-04-19 DIAGNOSIS — I10 Essential (primary) hypertension: Secondary | ICD-10-CM | POA: Diagnosis not present

## 2016-04-19 NOTE — ED Notes (Signed)
Pt sts she does not want to stay any longer; told pt she was next to go back and encouraged to stay but pt sts she wants to leave

## 2016-04-19 NOTE — ED Triage Notes (Signed)
Onset 3 days ago pt was walking with walker out of convenience store and walker went off edge of concrete and pt fell on right side.  C/o right arm pain and right back pain.  NO head injury.

## 2016-06-24 ENCOUNTER — Ambulatory Visit (INDEPENDENT_AMBULATORY_CARE_PROVIDER_SITE_OTHER): Payer: Medicare Other | Admitting: Family Medicine

## 2016-06-24 ENCOUNTER — Encounter: Payer: Self-pay | Admitting: Family Medicine

## 2016-06-24 DIAGNOSIS — J329 Chronic sinusitis, unspecified: Secondary | ICD-10-CM

## 2016-06-24 DIAGNOSIS — J320 Chronic maxillary sinusitis: Secondary | ICD-10-CM

## 2016-06-24 DIAGNOSIS — I10 Essential (primary) hypertension: Secondary | ICD-10-CM

## 2016-06-24 DIAGNOSIS — E099 Drug or chemical induced diabetes mellitus without complications: Secondary | ICD-10-CM | POA: Diagnosis not present

## 2016-06-24 DIAGNOSIS — T380X5A Adverse effect of glucocorticoids and synthetic analogues, initial encounter: Secondary | ICD-10-CM

## 2016-06-24 HISTORY — DX: Chronic sinusitis, unspecified: J32.9

## 2016-06-24 MED ORDER — BENZONATATE 200 MG PO CAPS: 200.0000 mg | ORAL_CAPSULE | Freq: Two times a day (BID) | ORAL | 1 refills | Status: DC | PRN

## 2016-06-24 MED ORDER — AMOXICILLIN 500 MG PO CAPS: 500.0000 mg | ORAL_CAPSULE | Freq: Three times a day (TID) | ORAL | 0 refills | Status: DC

## 2016-06-24 NOTE — Assessment & Plan Note (Addendum)
Only 2 days of symptoms but husband was recently hospitalized due to the severity of his symptoms. Patient herself with complicated PMH. Discussed likelihood this is viral and encouraged to increase rest, fluids and to add plain Mucinex bid, vit C 500 mg, elderberry liquid and tea with lemon and honey for cough. Given tessalon perles and a hard copy of Amoxicillin to use only if she worsens. They have Albuterol to use as needed for cough and wheezing

## 2016-06-24 NOTE — Assessment & Plan Note (Signed)
Well controlled, no changes to meds. Encouraged heart healthy diet such as the DASH diet and exercise as tolerated.  °

## 2016-06-24 NOTE — Progress Notes (Signed)
Patient ID: Sheryl [name], female   DOB: [birth year], 70 y.o.   MRN: [id number]   Subjective:    Patient ID: Sheryl [name], female    DOB: 1945-11-14, 70 y.o.   MRN: [id number]  Chief Complaint  Patient presents with  . Cough    congestion    HPI Patient is in today for evaluation of respiratory illness. Only 2 days of symptoms but husband was recently hospitalized due to the severity of his symptoms. Patient herself with complicated PMH. She denies fevers, chills, myalgias. She notes cough has come in fits and it has become productive today. Denies CP/palp/SOB/HA/fevers/GI or GU c/o. Taking meds as prescribed  Past Medical History:  Diagnosis Date  . Allergy   . Cataract   . 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 DEGENERATIVE JOINT DISEASE 12/14/2006  . EXOGENOUS OBESITY 12/14/2006  . FIBROMYALGIA 12/14/2006  . Gallstones   . Hearing loss of left ear   . HYPERLIPIDEMIA 12/14/2006  . HYPERTENSION 12/14/2006  . IBS (irritable bowel syndrome)   . 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 Sinusitis nasal 06/24/2016  . Sleep apnea    no CPAP  . Vision loss of right eye     Past Surgical History:  Procedure Laterality Date  . BRAIN BIOPSY     [facility]  . CERVICAL FUSION    . CHOLECYSTECTOMY    . COLONOSCOPY    . LUMBAR FUSION  07/25/2012  . TONSILLECTOMY      Family History  Problem Relation Age of Onset  . Diabetes Sister   . Hypertension Mother   . Emphysema Father   . Colon cancer Neg Hx   . Esophageal cancer Neg Hx   . Rectal cancer Neg Hx   . Stomach cancer Neg Hx     Social History   Social History  . Marital status: Married    Spouse name: N/A  . Number of children: 0  . Years of education: hs   Occupational History  . disabled   . clerical    Social History Main Topics  . Smoking status: Never Smoker  . Smokeless tobacco: Never Used  . Alcohol use No     Comment: One drink per year  . Drug use: No  . Sexual activity: Not on file   Other Topics Concern  . Not on file    Social History Narrative   Married no children 2 caffeinated beverages a day    Outpatient Medications Prior to Visit  Medication Sig Dispense Refill  . aspirin 81 MG tablet Take 81 mg by mouth daily.      Marland Kitchen atorvastatin (LIPITOR) 40 MG tablet Take 1 tablet (40 mg total) by mouth daily. 90 tablet 3  . brimonidine (ALPHAGAN) 0.15 % ophthalmic solution Place 1 drop into the left eye 2 (two) times daily.     . cetirizine (ZYRTEC) 10 MG tablet Take 10 mg by mouth daily.      . Cholecalciferol (VITAMIN D3) 2000 UNITS TABS Take 1 tablet by mouth daily.    Marland Kitchen dicyclomine (BENTYL) 20 MG tablet Take 20 mg by mouth every 6 (six) hours.    . ENSURE (ENSURE) Take 1 Can by mouth daily.    . fesoterodine (TOVIAZ) 8 MG TB24 tablet Take 8 mg by mouth daily.    . fluticasone (FLONASE) 50 MCG/ACT nasal spray Place 1 spray into both nostrils daily as needed for allergies.     . hyoscyamine (LEVSIN SL) 0.125 MG SL tablet Place 1 tablet (0.125  mg total) under the tongue every 4 (four) hours as needed. 60 tablet 5  . lisinopril (PRINIVIL,ZESTRIL) 40 MG tablet TAKE ONE TABLET BY MOUTH ONCE DAILY 90 tablet 2  . loperamide (IMODIUM A-D) 2 MG tablet Take 2 mg by mouth 4 (four) times daily as needed for diarrhea or loose stools.    . methylPREDNISolone (MEDROL DOSEPAK) 4 MG TBPK tablet As directed 30 tablet 0  . nitrofurantoin (MACRODANTIN) 100 MG capsule Take 1 capsule by mouth daily.    . Nutritional Supplements (ENSURE NUTRITION SHAKE) LIQD Take 1 Bottle by mouth daily as needed. For nutrition    . omeprazole (PRILOSEC) 20 MG capsule TAKE 1 CAPSULE ONE TIME DAILY 90 capsule 3  . Potassium Gluconate ([employer] POTASSIUM GLUCONATE) 2 MEQ TABS Take 1 tablet by mouth daily.     . predniSONE (DELTASONE) 2.5 MG tablet Take 1 tablet by mouth daily.    Marland Kitchen sulfamethoxazole-trimethoprim (BACTRIM DS,SEPTRA DS) 800-160 MG per tablet TAKE 1 TABLET THREE TIMES WEEKLY 39 tablet 3  . albuterol (PROVENTIL HFA;VENTOLIN HFA) 108 (90  BASE) MCG/ACT inhaler Inhale 2 puffs into the lungs every 6 (six) hours as needed. Reported on 09/24/2015     No facility-administered medications prior to visit.     Allergies  Allergen Reactions  . Adhesive [Tape] Rash  . Latex Anaphylaxis and Rash  . Cyclophosphamide Other (See Comments)    Caused diverticulitis.  She would not eat or drink for days, while taking it.    Review of Systems  Constitutional: Negative for chills, fever and malaise/fatigue.  HENT: Positive for congestion.   Eyes: Negative for blurred vision.  Respiratory: Positive for cough and sputum production. Negative for shortness of breath.   Cardiovascular: Negative for chest pain, palpitations and leg swelling.  Gastrointestinal: Negative for abdominal pain, blood in stool and nausea.  Genitourinary: Positive for frequency. Negative for dysuria.  Musculoskeletal: Negative for falls and myalgias.  Skin: Negative for rash.  Neurological: Negative for dizziness, loss of consciousness and headaches.  Endo/Heme/Allergies: Negative for environmental allergies.  Psychiatric/Behavioral: Negative for depression. The patient is not nervous/anxious.        Objective:    Physical Exam  Constitutional: She is oriented to person, place, and time. She appears well-developed and well-nourished. No distress.  HENT:  Head: Normocephalic and atraumatic.  Nose: Nose normal.  Oropharynx mildly erythematous  Eyes: Right eye exhibits no discharge. Left eye exhibits no discharge.  Neck: Normal range of motion. Neck supple.  Cardiovascular: Normal rate and regular rhythm.   No murmur heard. Pulmonary/Chest: Effort normal and breath sounds normal.  Abdominal: Soft. Bowel sounds are normal. There is no tenderness.  Musculoskeletal: She exhibits no edema.  Lymphadenopathy:    She has no cervical adenopathy.  Neurological: She is alert and oriented to person, place, and time.  Skin: Skin is warm and dry.  Psychiatric: She has  a normal mood and affect.  Nursing note and vitals reviewed.   BP 140/80 (BP Location: Left Arm, Patient Position: Sitting, Cuff Size: Normal)   Pulse 68   Temp 98.1 F (36.7 C) (Oral)   Ht 5' (1.524 m)   Wt 194 lb (88 kg)   SpO2 97%   BMI 37.89 kg/m  Wt Readings from Last 3 Encounters:  06/24/16 194 lb (88 kg)  03/11/16 189 lb (85.7 kg)  01/07/16 198 lb (89.8 kg)     Lab Results  Component Value Date   WBC 7.2 01/07/2016   HGB 14.6 01/07/2016  HCT 44.6 01/07/2016   PLT 159 01/07/2016   GLUCOSE 93 01/07/2016   CHOL 242 (H) 04/17/2012   TRIG 204.0 (H) 04/17/2012   HDL 48.40 04/17/2012   LDLDIRECT 164.3 04/17/2012   ALT 20 09/24/2015   AST 19 09/24/2015   NA 141 01/07/2016   K 4.0 01/07/2016   CL 108 01/07/2016   CREATININE 0.95 01/07/2016   BUN 20 01/07/2016   CO2 24 01/07/2016   TSH 1.50 12/10/2007   HGBA1C 5.7 04/17/2012    Lab Results  Component Value Date   TSH 1.50 12/10/2007   Lab Results  Component Value Date   WBC 7.2 01/07/2016   HGB 14.6 01/07/2016   HCT 44.6 01/07/2016   MCV 88.8 01/07/2016   PLT 159 01/07/2016   Lab Results  Component Value Date   NA 141 01/07/2016   K 4.0 01/07/2016   CHLORIDE 107 09/24/2015   CO2 24 01/07/2016   GLUCOSE 93 01/07/2016   BUN 20 01/07/2016   CREATININE 0.95 01/07/2016   BILITOT 0.41 09/24/2015   ALKPHOS 124 09/24/2015   AST 19 09/24/2015   ALT 20 09/24/2015   PROT 7.2 09/24/2015   ALBUMIN 4.1 09/24/2015   CALCIUM 9.3 01/07/2016   ANIONGAP 9 01/07/2016   EGFR 52 (L) 09/24/2015   GFR 65.71 02/21/2014   Lab Results  Component Value Date   CHOL 242 (H) 04/17/2012   Lab Results  Component Value Date   HDL 48.40 04/17/2012   No results found for: [facility] Lab Results  Component Value Date   TRIG 204.0 (H) 04/17/2012   Lab Results  Component Value Date   CHOLHDL 5 04/17/2012   Lab Results  Component Value Date   HGBA1C 5.7 04/17/2012       Assessment & Plan:   Problem List Items  Addressed This Visit    Essential hypertension    Well controlled, no changes to meds. Encouraged heart healthy diet such as the DASH diet and exercise as tolerated.       Steroid-induced diabetes ([place])    Encouraged to check sugars while ill she generally does not check them      Sinusitis nasal    Only 2 days of symptoms but husband was recently hospitalized due to the severity of his symptoms. Patient herself with complicated PMH. Discussed likelihood this is viral and encouraged to increase rest, fluids and to add plain Mucinex bid, vit C 500 mg, elderberry liquid and tea with lemon and honey for cough. Given tessalon perles and a hard copy of Amoxicillin to use only if she worsens. They have Albuterol to use as needed for cough and wheezing      Relevant Medications   benzonatate (TESSALON) 200 MG capsule   amoxicillin (AMOXIL) 500 MG capsule      I am having Ms. Blancett start on benzonatate and amoxicillin. I am also having her maintain her aspirin, cetirizine, predniSONE, Vitamin D3, brimonidine, ENSURE, omeprazole, sulfamethoxazole-trimethoprim, Potassium Gluconate, albuterol, lisinopril, nitrofurantoin, fluticasone, hyoscyamine, atorvastatin, methylPREDNISolone, ENSURE NUTRITION SHAKE, dicyclomine, loperamide, and fesoterodine.  Meds ordered this encounter  Medications  . benzonatate (TESSALON) 200 MG capsule    Sig: Take 1 capsule (200 mg total) by mouth 2 (two) times daily as needed for cough.    Dispense:  30 capsule    Refill:  1  . amoxicillin (AMOXIL) 500 MG capsule    Sig: Take 1 capsule (500 mg total) by mouth 3 (three) times daily.    Dispense:  30  capsule    Refill:  0     Penni Homans, MD

## 2016-06-24 NOTE — Progress Notes (Signed)
Pre visit review using our clinic review tool, if applicable. No additional management support is needed unless otherwise documented below in the visit note. 

## 2016-06-24 NOTE — Patient Instructions (Signed)
Encouraged increased rest and hydration, add probiotics, zinc such as Coldeze or Xicam. Treat fevers as needed. Try warm tea with lemon and honey. Mucinex twice daily x 10 days. Elderberry liquid can help the cough. Vitamin C 500 mg daily    Sinusitis, Adult Sinusitis is soreness and inflammation of your sinuses. Sinuses are hollow spaces in the bones around your face. Your sinuses are located:  Around your eyes.  In the middle of your forehead.  Behind your nose.  In your cheekbones. Your sinuses and nasal passages are lined with a stringy fluid (mucus). Mucus normally drains out of your sinuses. When your nasal tissues become inflamed or swollen, the mucus can become trapped or blocked so air cannot flow through your sinuses. This allows bacteria, viruses, and funguses to grow, which leads to infection. Sinusitis can develop quickly and last for 7?10 days (acute) or for more than 12 weeks (chronic). Sinusitis often develops after a cold. What are the causes? This condition is caused by anything that creates swelling in the sinuses or stops mucus from draining, including:  Allergies.  Asthma.  Bacterial or viral infection.  Abnormally shaped bones between the nasal passages.  Nasal growths that contain mucus (nasal polyps).  Narrow sinus openings.  Pollutants, such as chemicals or irritants in the air.  A foreign object stuck in the nose.  A fungal infection. This is rare. What increases the risk? The following factors may make you more likely to develop this condition:  Having allergies or asthma.  Having had a recent cold or respiratory tract infection.  Having structural deformities or blockages in your nose or sinuses.  Having a weak immune system.  Doing a lot of swimming or diving.  Overusing nasal sprays.  Smoking. What are the signs or symptoms? The main symptoms of this condition are pain and a feeling of pressure around the affected sinuses. Other  symptoms include:  Upper toothache.  Earache.  Headache.  Bad breath.  Decreased sense of smell and taste.  A cough that may get worse at night.  Fatigue.  Fever.  Thick drainage from your nose. The drainage is often green and it may contain pus (purulent).  Stuffy nose or congestion.  Postnasal drip. This is when extra mucus collects in the throat or back of the nose.  Swelling and warmth over the affected sinuses.  Sore throat.  Sensitivity to light. How is this diagnosed? This condition is diagnosed based on symptoms, a medical history, and a physical exam. To find out if your condition is acute or chronic, your health care provider may:  Look in your nose for signs of nasal polyps.  Tap over the affected sinus to check for signs of infection.  View the inside of your sinuses using an imaging device that has a light attached (endoscope). If your health care provider suspects that you have chronic sinusitis, you may also:  Be tested for allergies.  Have a sample of mucus taken from your nose (nasal culture) and checked for bacteria.  Have a mucus sample examined to see if your sinusitis is related to an allergy. If your sinusitis does not respond to treatment and it lasts longer than 8 weeks, you may have an MRI or CT scan to check your sinuses. These scans also help to determine how severe your infection is. In rare cases, a bone biopsy may be done to rule out more serious types of fungal sinus disease. How is this treated? Treatment for sinusitis depends  on the cause and whether your condition is chronic or acute. If a virus is causing your sinusitis, your symptoms will go away on their own within 10 days. You may be given medicines to relieve your symptoms, including:  Topical nasal decongestants. They shrink swollen nasal passages and let mucus drain from your sinuses.  Antihistamines. These drugs block inflammation that is triggered by allergies. This can  help to ease swelling in your nose and sinuses.  Topical nasal corticosteroids. These are nasal sprays that ease inflammation and swelling in your nose and sinuses.  Nasal saline washes. These rinses can help to get rid of thick mucus in your nose. If your condition is caused by bacteria, you will be given an antibiotic medicine. If your condition is caused by a fungus, you will be given an antifungal medicine. Surgery may be needed to correct underlying conditions, such as narrow nasal passages. Surgery may also be needed to remove polyps. Follow these instructions at home: Medicines  Take, use, or apply over-the-counter and prescription medicines only as told by your health care provider. These may include nasal sprays.  If you were prescribed an antibiotic medicine, take it as told by your health care provider. Do not stop taking the antibiotic even if you start to feel better. Hydrate and Humidify  Drink enough water to keep your urine clear or pale yellow. Staying hydrated will help to thin your mucus.  Use a cool mist humidifier to keep the humidity level in your home above 50%.  Inhale steam for 10-15 minutes, 3-4 times a day or as told by your health care provider. You can do this in the bathroom while a hot shower is running.  Limit your exposure to cool or dry air. Rest  Rest as much as possible.  Sleep with your head raised (elevated).  Make sure to get enough sleep each night. General instructions  Apply a warm, moist washcloth to your face 3-4 times a day or as told by your health care provider. This will help with discomfort.  Wash your hands often with soap and water to reduce your exposure to viruses and other germs. If soap and water are not available, use hand sanitizer.  Do not smoke. Avoid being around people who are smoking (secondhand smoke).  Keep all follow-up visits as told by your health care provider. This is important. Contact a health care provider  if:  You have a fever.  Your symptoms get worse.  Your symptoms do not improve within 10 days. Get help right away if:  You have a severe headache.  You have persistent vomiting.  You have pain or swelling around your face or eyes.  You have vision problems.  You develop confusion.  Your neck is stiff.  You have trouble breathing. This information is not intended to replace advice given to you by your health care provider. Make sure you discuss any questions you have with your health care provider. Document Released: 06/13/2005 Document Revised: 02/07/2016 Document Reviewed: 04/08/2015 Elsevier Interactive Patient Education  2017 [employer].

## 2016-06-24 NOTE — Assessment & Plan Note (Signed)
Encouraged to check sugars while ill she generally does not check them

## 2016-07-04 DIAGNOSIS — M4716 Other spondylosis with myelopathy, lumbar region: Secondary | ICD-10-CM | POA: Diagnosis not present

## 2016-07-04 DIAGNOSIS — M5416 Radiculopathy, lumbar region: Secondary | ICD-10-CM | POA: Diagnosis not present

## 2016-07-04 DIAGNOSIS — Z6834 Body mass index (BMI) 34.0-34.9, adult: Secondary | ICD-10-CM | POA: Diagnosis not present

## 2016-07-04 DIAGNOSIS — M5136 Other intervertebral disc degeneration, lumbar region: Secondary | ICD-10-CM | POA: Diagnosis not present

## 2016-07-05 ENCOUNTER — Other Ambulatory Visit: Payer: Self-pay | Admitting: Orthopaedic Surgery

## 2016-07-05 DIAGNOSIS — M5136 Other intervertebral disc degeneration, lumbar region: Secondary | ICD-10-CM

## 2016-07-10 ENCOUNTER — Encounter (HOSPITAL_COMMUNITY): Payer: Self-pay

## 2016-07-10 ENCOUNTER — Emergency Department (HOSPITAL_COMMUNITY)
Admission: EM | Admit: 2016-07-10 | Discharge: 2016-07-10 | Disposition: A | Discharge status: Home / Self Care | Payer: Medicare Other | Attending: Emergency Medicine | Admitting: Emergency Medicine

## 2016-07-10 DIAGNOSIS — M79671 Pain in right foot: Secondary | ICD-10-CM | POA: Insufficient documentation

## 2016-07-10 DIAGNOSIS — M25552 Pain in left hip: Secondary | ICD-10-CM | POA: Diagnosis not present

## 2016-07-10 DIAGNOSIS — Z79899 Other long term (current) drug therapy: Secondary | ICD-10-CM | POA: Diagnosis not present

## 2016-07-10 DIAGNOSIS — I10 Essential (primary) hypertension: Secondary | ICD-10-CM | POA: Diagnosis not present

## 2016-07-10 DIAGNOSIS — M25551 Pain in right hip: Secondary | ICD-10-CM | POA: Diagnosis not present

## 2016-07-10 DIAGNOSIS — M25572 Pain in left ankle and joints of left foot: Secondary | ICD-10-CM | POA: Diagnosis not present

## 2016-07-10 DIAGNOSIS — M79672 Pain in left foot: Secondary | ICD-10-CM | POA: Insufficient documentation

## 2016-07-10 DIAGNOSIS — R52 Pain, unspecified: Secondary | ICD-10-CM | POA: Diagnosis not present

## 2016-07-10 DIAGNOSIS — M25571 Pain in right ankle and joints of right foot: Secondary | ICD-10-CM | POA: Diagnosis not present

## 2016-07-10 DIAGNOSIS — Z91040 Latex allergy status: Secondary | ICD-10-CM | POA: Diagnosis not present

## 2016-07-10 MED ORDER — KETOROLAC TROMETHAMINE 30 MG/ML IJ SOLN
30.0000 mg | Freq: Once | INTRAMUSCULAR | Status: AC
  Administered 2016-07-10: 30 mg via INTRAMUSCULAR
  Filled 2016-07-10: qty 1

## 2016-07-10 MED ORDER — HYDROCODONE-ACETAMINOPHEN 5-325 MG PO TABS
1.0000 | ORAL_TABLET | Freq: Once | ORAL | Status: AC
Start: 2016-07-10 — End: 2016-07-10
  Administered 2016-07-10: 1 via ORAL
  Filled 2016-07-10: qty 1

## 2016-07-10 NOTE — ED Notes (Signed)
Bed: WA07 Expected date:  Expected time:  Means of arrival:  Comments: 71 yo F/ foot pain

## 2016-07-10 NOTE — ED Notes (Signed)
No respiratory or acute distress noted alert and oriented x 3 no reaction to medication noted call light in reach family at bedside. 

## 2016-07-10 NOTE — ED Notes (Signed)
Bed: WA08 Expected date:  Expected time:  Means of arrival:  Comments: 

## 2016-07-10 NOTE — Discharge Instructions (Signed)
Please call Dr. Towanda Malkin office on Monday morning to schedule follow up appointment and inquire about your outpatient MRI.  Continue taking Tramadol as directed for pain.  Return to ER for fevers, numbness/tingling, muscle weakness, new or worsening symptoms, any additional concerns.

## 2016-07-10 NOTE — ED Provider Notes (Signed)
[facility] DEPT Provider Note   CSN: [id number] Arrival date & time: 07/10/16  0442     History   Chief Complaint Chief Complaint  Patient presents with  . Foot Pain    HPI Sheryl [name] is a 71 y.o. female.  The history is provided by the patient and medical records. No language interpreter was used.  Foot Pain  Pertinent negatives include no abdominal pain.   Sheryl [name] is a 71 y.o. female  with a PMH of fibromyalgia, spinal stenosis followed by orthopedics, Dr. Patrice Paradise who presents to the Emergency Department complaining of left hip pain and bilateral foot pain. Patient states when she awoke this morning, her bilateral feet were hurting so bad, she could not get out of bed. She was seen by nurse practitioner at Dr. Towanda Malkin office earlier this week, where she was given tramadol for pain. She states that this has not provided adequate relief, however she has not taken this today. Outpatient MRI was ordered by clinic, however patient has not scheduled a time to have imaging done. She states that someone was suppose to call her and let her know when to go for the MRI, but she has not heard anything from the office. Denies upper back or neck pain, Denies fever, saddle anesthesia, weakness, numbness, urinary complaints including retention/incontinence.    Past Medical History:  Diagnosis Date  . Allergy   . Cataract   . 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 DEGENERATIVE JOINT DISEASE 12/14/2006  . EXOGENOUS OBESITY 12/14/2006  . FIBROMYALGIA 12/14/2006  . Gallstones   . Hearing loss of left ear   . HYPERLIPIDEMIA 12/14/2006  . HYPERTENSION 12/14/2006  . IBS (irritable bowel syndrome)   . Neurosarcoidosis ([place])   . Sinusitis nasal 06/24/2016  . Sleep apnea    no CPAP  . Vision loss of right eye     Patient Active Problem List   Diagnosis Date Noted  . Sinusitis nasal 06/24/2016  . History of IBS 10/31/2014  . 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 Back pain 03/26/2014  . Preventive  measure 03/26/2014  . Spinal stenosis 04/17/2012  . Neurosarcoidosis ([place]) 01/04/2012  . 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 Chronic lymphocytic leukemia ([place]) 12/14/2006  . Dyslipidemia 12/14/2006  . EXOGENOUS OBESITY 12/14/2006  . Essential hypertension 12/14/2006  . Osteoarthritis 12/14/2006  . FIBROMYALGIA 12/14/2006    Past Surgical History:  Procedure Laterality Date  . BRAIN BIOPSY     [facility]  . CERVICAL FUSION    . CHOLECYSTECTOMY    . COLONOSCOPY    . LUMBAR FUSION  07/25/2012  . TONSILLECTOMY      OB History    No data available       Home Medications    Prior to Admission medications   Medication Sig Start Date End Date Taking? Authorizing Provider  albuterol (PROVENTIL HFA;VENTOLIN HFA) 108 (90 BASE) MCG/ACT inhaler Inhale 2 puffs into the lungs every 6 (six) hours as needed. Reported on 09/24/2015 01/23/15 01/23/16  Historical Provider, MD  amoxicillin (AMOXIL) 500 MG capsule Take 1 capsule (500 mg total) by mouth 3 (three) times daily. 06/24/16   Mosie Lukes, MD  aspirin 81 MG tablet Take 81 mg by mouth daily.      Historical Provider, MD  atorvastatin (LIPITOR) 40 MG tablet Take 1 tablet (40 mg total) by mouth daily. 09/30/15   Marletta Lor, MD  benzonatate (TESSALON) 200 MG capsule Take 1 capsule (200 mg total) by  mouth 2 (two) times daily as needed for cough. 06/24/16   Mosie Lukes, MD  brimonidine (ALPHAGAN) 0.15 % ophthalmic solution Place 1 drop into the left eye 2 (two) times daily.  09/03/14   Historical Provider, MD  cetirizine (ZYRTEC) 10 MG tablet Take 10 mg by mouth daily.      Historical Provider, MD  Cholecalciferol (VITAMIN D3) 2000 UNITS TABS Take 1 tablet by mouth daily.    Historical Provider, MD  dicyclomine (BENTYL) 20 MG tablet Take 20 mg by mouth every 6 (six) hours.    Historical Provider, MD  ENSURE (ENSURE) Take 1 Can by mouth daily.    Historical Provider, MD  fesoterodine  (TOVIAZ) 8 MG TB24 tablet Take 8 mg by mouth daily.    Historical Provider, MD  fluticasone (FLONASE) 50 MCG/ACT nasal spray Place 1 spray into both nostrils daily as needed for allergies.  10/16/12   Historical Provider, MD  hyoscyamine (LEVSIN SL) 0.125 MG SL tablet Place 1 tablet (0.125 mg total) under the tongue every 4 (four) hours as needed. 09/30/15   Marletta Lor, MD  lisinopril (PRINIVIL,ZESTRIL) 40 MG tablet TAKE ONE TABLET BY MOUTH ONCE DAILY 04/14/15   Marletta Lor, MD  loperamide (IMODIUM A-D) 2 MG tablet Take 2 mg by mouth 4 (four) times daily as needed for diarrhea or loose stools.    Historical Provider, MD  methylPREDNISolone (MEDROL DOSEPAK) 4 MG TBPK tablet As directed 03/11/16   Gardiner Barefoot, DPM  nitrofurantoin (MACRODANTIN) 100 MG capsule Take 1 capsule by mouth daily. 08/12/15   Historical Provider, MD  Nutritional Supplements (ENSURE NUTRITION SHAKE) LIQD Take 1 Bottle by mouth daily as needed. For nutrition    Historical Provider, MD  omeprazole (PRILOSEC) 20 MG capsule TAKE 1 CAPSULE ONE TIME DAILY 01/14/15   Marletta Lor, MD  Potassium Gluconate ([employer]S POTASSIUM GLUCONATE) 2 MEQ TABS Take 1 tablet by mouth daily.     Historical Provider, MD  predniSONE (DELTASONE) 2.5 MG tablet Take 1 tablet by mouth daily. 04/19/14   Historical Provider, MD  sulfamethoxazole-trimethoprim (BACTRIM DS,SEPTRA DS) 800-160 MG per tablet TAKE 1 TABLET THREE TIMES WEEKLY 01/14/15   Marletta Lor, MD    Family History Family History  Problem Relation Age of Onset  . Diabetes Sister   . Hypertension Mother   . Emphysema Father   . Colon cancer Neg Hx   . Esophageal cancer Neg Hx   . Rectal cancer Neg Hx   . Stomach cancer Neg Hx     Social History Social History  Substance Use Topics  . Smoking status: Never Smoker  . Smokeless tobacco: Never Used  . Alcohol use No     Comment: One drink per year     Allergies   Adhesive [tape]; Latex; and  Cyclophosphamide   Review of Systems Review of Systems  Constitutional: Negative for fever.  Gastrointestinal: Negative for abdominal pain, nausea and vomiting.  Genitourinary: Negative for dysuria.       Negative incontinence.   Musculoskeletal: Positive for arthralgias and back pain. Negative for neck pain.  Neurological: Negative for weakness and numbness.     Physical Exam Updated Vital Signs BP 175/68 (BP Location: Left Arm)   Pulse 77   Temp 97.8 F (36.6 C) (Oral)   Resp 20   Ht 5' (1.524 m)   Wt 88 kg   SpO2 97%   BMI 37.89 kg/m   Physical Exam  Constitutional: She is oriented to  person, place, and time. She appears well-developed and well-nourished.  Neck:  Full ROM without pain. No midline tenderness  Cardiovascular: Normal rate, regular rhythm and normal heart sounds.   No murmur heard. 2+ DP bilaterally.   Pulmonary/Chest: Effort normal and breath sounds normal. No respiratory distress. She has no wheezes. She has no rales.  Abdominal: Soft. She exhibits no distension. There is no tenderness.  Musculoskeletal:  TTP of left lateral hip and L-spine.  Straight leg raises are negative bilaterally for radicular symptoms. 5/5 muscle strength of bilateral LE's.   Neurological: She is alert and oriented to person, place, and time. She has normal reflexes.  Bilateral lower extremities neurovascularly intact.  Skin: Skin is warm and dry. No rash noted. No erythema.  Nursing note and vitals reviewed.    ED Treatments / Results  Labs (all labs ordered are listed, but only abnormal results are displayed) Labs Reviewed - No data to display  EKG  EKG Interpretation None       Radiology No results found.  Procedures Procedures (including critical care time)  Medications Ordered in ED Medications  ketorolac (TORADOL) 30 MG/ML injection 30 mg (30 mg Intramuscular Given 07/10/16 0600)  HYDROcodone-acetaminophen (NORCO/VICODIN) 5-325 MG per tablet 1 tablet (1  tablet Oral Given 07/10/16 0600)     Initial Impression / Assessment and Plan / ED Course  I have reviewed the triage vital signs and the nursing notes.  Pertinent labs & imaging results that were available during my care of the patient were reviewed by me and considered in my medical decision making (see chart for details).  Clinical Course    Sheryl [name] is a 71 y.o. female who presents to ED for left lateral hip pain and bilateral foot pain. Patient demonstrates no lower extremity weakness, saddle anesthesia, bowel or bladder incontinence, or neuro deficits. No concern for cauda equina. No fevers or other infectious symptoms to suggest that the patient's back pain is due to an infection. Lower extremities are neurovascularly intact. Patient is followed by Dr. Patrice Paradise for low back pain / hip pain where she was seen in clinic this week for same. She was prescribed Tramadol for pain and outpatient MRI was ordered. It appears that patient has not yet scheduled the MRI appointment. Patient states that no one has called her about her appointment. I offered to obtain MRI here vs. Pain control here and call clinic to schedule MRI Monday morning. Patient elects for pain control and will call to schedule follow up / MRI on Monday. Reasons to return to ED discussed and all questions answered.   Patient discussed with Dr. Dina Rich who agrees with treatment plan.   Final Clinical Impressions(s) / ED Diagnoses   Final diagnoses:  Left hip pain  Bilateral foot pain    New Prescriptions New Prescriptions   No medications on file     [facility], PA-C 07/10/16 [place], MD 07/11/16 [phone]

## 2016-07-10 NOTE — ED Triage Notes (Signed)
Right and left foot pain since 0100 today no trauma no swelling noted no fever noted.

## 2016-07-15 ENCOUNTER — Ambulatory Visit
Admission: RE | Admit: 2016-07-15 | Discharge: 2016-07-15 | Disposition: A | Discharge status: Home / Self Care | Payer: Medicare Other | Source: Ambulatory Visit | Attending: Orthopaedic Surgery | Admitting: Orthopaedic Surgery

## 2016-07-15 DIAGNOSIS — M48061 Spinal stenosis, lumbar region without neurogenic claudication: Secondary | ICD-10-CM | POA: Diagnosis not present

## 2016-07-15 DIAGNOSIS — M5136 Other intervertebral disc degeneration, lumbar region: Secondary | ICD-10-CM

## 2016-07-21 DIAGNOSIS — M4726 Other spondylosis with radiculopathy, lumbar region: Secondary | ICD-10-CM | POA: Diagnosis not present

## 2016-07-21 DIAGNOSIS — Z6834 Body mass index (BMI) 34.0-34.9, adult: Secondary | ICD-10-CM | POA: Diagnosis not present

## 2016-07-21 DIAGNOSIS — M5416 Radiculopathy, lumbar region: Secondary | ICD-10-CM | POA: Diagnosis not present

## 2016-08-02 DIAGNOSIS — M5416 Radiculopathy, lumbar region: Secondary | ICD-10-CM | POA: Diagnosis not present

## 2016-08-10 ENCOUNTER — Other Ambulatory Visit: Payer: Self-pay | Admitting: Internal Medicine

## 2016-08-10 MED ORDER — LISINOPRIL 40 MG PO TABS
40.0000 mg | ORAL_TABLET | Freq: Every day | ORAL | 2 refills | Status: DC
Start: 2016-08-10 — End: 2016-12-05

## 2016-08-10 MED ORDER — ATORVASTATIN CALCIUM 40 MG PO TABS
40.0000 mg | ORAL_TABLET | Freq: Every day | ORAL | 3 refills | Status: DC
Start: 2016-08-10 — End: 2022-10-07

## 2016-08-10 MED ORDER — OMEPRAZOLE 20 MG PO CPDR: DELAYED_RELEASE_CAPSULE | ORAL | 3 refills | Status: DC

## 2016-08-10 MED ORDER — MYRBETRIQ 50 MG PO TB24: 50.0000 mg | ORAL_TABLET | Freq: Every day | ORAL | 2 refills | Status: DC

## 2016-08-10 MED ORDER — PREDNISONE 2.5 MG PO TABS: 2.5000 mg | ORAL_TABLET | Freq: Every day | ORAL | 2 refills | Status: DC

## 2016-08-16 DIAGNOSIS — M5416 Radiculopathy, lumbar region: Secondary | ICD-10-CM | POA: Diagnosis not present

## 2016-08-31 DIAGNOSIS — M5136 Other intervertebral disc degeneration, lumbar region: Secondary | ICD-10-CM | POA: Diagnosis not present

## 2016-08-31 DIAGNOSIS — M4726 Other spondylosis with radiculopathy, lumbar region: Secondary | ICD-10-CM | POA: Diagnosis not present

## 2016-08-31 DIAGNOSIS — R262 Difficulty in walking, not elsewhere classified: Secondary | ICD-10-CM | POA: Diagnosis not present

## 2016-08-31 DIAGNOSIS — M5416 Radiculopathy, lumbar region: Secondary | ICD-10-CM | POA: Diagnosis not present

## 2016-08-31 DIAGNOSIS — M4716 Other spondylosis with myelopathy, lumbar region: Secondary | ICD-10-CM | POA: Diagnosis not present

## 2016-09-01 ENCOUNTER — Other Ambulatory Visit: Payer: Self-pay | Admitting: Orthopaedic Surgery

## 2016-09-01 DIAGNOSIS — M5135 Other intervertebral disc degeneration, thoracolumbar region: Secondary | ICD-10-CM

## 2016-09-07 ENCOUNTER — Other Ambulatory Visit: Payer: Self-pay | Admitting: Orthopaedic Surgery

## 2016-09-07 DIAGNOSIS — M8580 Other specified disorders of bone density and structure, unspecified site: Secondary | ICD-10-CM

## 2016-09-10 ENCOUNTER — Ambulatory Visit
Admission: RE | Admit: 2016-09-10 | Discharge: 2016-09-10 | Disposition: A | Discharge status: Home / Self Care | Payer: Medicare Other | Source: Ambulatory Visit | Attending: Orthopaedic Surgery | Admitting: Orthopaedic Surgery

## 2016-09-10 DIAGNOSIS — M47814 Spondylosis without myelopathy or radiculopathy, thoracic region: Secondary | ICD-10-CM | POA: Diagnosis not present

## 2016-09-10 DIAGNOSIS — M5135 Other intervertebral disc degeneration, thoracolumbar region: Secondary | ICD-10-CM

## 2016-09-14 ENCOUNTER — Ambulatory Visit
Admission: RE | Admit: 2016-09-14 | Discharge: 2016-09-14 | Disposition: A | Discharge status: Home / Self Care | Payer: Medicare Other | Source: Ambulatory Visit | Attending: Orthopaedic Surgery | Admitting: Orthopaedic Surgery

## 2016-09-14 DIAGNOSIS — M8580 Other specified disorders of bone density and structure, unspecified site: Secondary | ICD-10-CM

## 2016-09-14 DIAGNOSIS — Z780 Asymptomatic menopausal state: Secondary | ICD-10-CM | POA: Diagnosis not present

## 2016-09-14 DIAGNOSIS — Z13820 Encounter for screening for osteoporosis: Secondary | ICD-10-CM | POA: Diagnosis not present

## 2016-09-21 DIAGNOSIS — M4014 Other secondary kyphosis, thoracic region: Secondary | ICD-10-CM | POA: Diagnosis not present

## 2016-09-21 DIAGNOSIS — M546 Pain in thoracic spine: Secondary | ICD-10-CM | POA: Diagnosis not present

## 2016-09-21 DIAGNOSIS — Z6834 Body mass index (BMI) 34.0-34.9, adult: Secondary | ICD-10-CM | POA: Diagnosis not present

## 2016-09-21 DIAGNOSIS — M5135 Other intervertebral disc degeneration, thoracolumbar region: Secondary | ICD-10-CM | POA: Diagnosis not present

## 2016-10-07 DIAGNOSIS — M5136 Other intervertebral disc degeneration, lumbar region: Secondary | ICD-10-CM | POA: Diagnosis not present

## 2016-10-07 DIAGNOSIS — M4716 Other spondylosis with myelopathy, lumbar region: Secondary | ICD-10-CM | POA: Diagnosis not present

## 2016-10-07 DIAGNOSIS — M4014 Other secondary kyphosis, thoracic region: Secondary | ICD-10-CM | POA: Diagnosis not present

## 2016-10-14 ENCOUNTER — Ambulatory Visit (INDEPENDENT_AMBULATORY_CARE_PROVIDER_SITE_OTHER): Payer: Medicare Other | Admitting: Internal Medicine

## 2016-10-14 ENCOUNTER — Encounter: Payer: Self-pay | Admitting: Internal Medicine

## 2016-10-14 VITALS — BP 142/80 | HR 84 | Temp 97.7°F | Ht 60.0 in | Wt 173.4 lb

## 2016-10-14 DIAGNOSIS — I10 Essential (primary) hypertension: Secondary | ICD-10-CM

## 2016-10-14 DIAGNOSIS — M4807 Spinal stenosis, lumbosacral region: Secondary | ICD-10-CM

## 2016-10-14 DIAGNOSIS — D8689 Sarcoidosis of other sites: Secondary | ICD-10-CM

## 2016-10-14 DIAGNOSIS — C9110 Chronic lymphocytic leukemia of B-cell type not having achieved remission: Secondary | ICD-10-CM

## 2016-10-14 DIAGNOSIS — C9190 Lymphoid leukemia, unspecified not having achieved remission: Secondary | ICD-10-CM | POA: Diagnosis not present

## 2016-10-14 MED ORDER — LORAZEPAM 1 MG PO TABS: 1.0000 mg | ORAL_TABLET | Freq: Two times a day (BID) | ORAL | 1 refills | Status: DC | PRN

## 2016-10-14 NOTE — Progress Notes (Signed)
Subjective:    Patient ID: Sheryl [name], female    DOB: 1945-07-10, 71 y.o.   MRN: [id number]  HPI  71 year old patient who is seen today for surgical clearance for a decompressive laminectomy.  She has had a prior lumbar surgery and fusion in the past.  She continues to have refractory low back pain She has essential hypertension She continues to fall at [facility] for neurosarcoidosis and remains on prednisone 2.5 milligrams daily.  Complications have included gait abnormality.  She continues to be dependent on a walker.  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 She is very sedentary due to her impaired gait, but denies any history of chest pain or shortness of breath  She has been under considerable stress due to a recent diagnosis of renal cancer affecting her husband.  She has been quite anxious throughout the day and sleeping poorly  Past Medical History:  Diagnosis Date  . Allergy   . Cataract   . 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 DEGENERATIVE JOINT DISEASE 12/14/2006  . EXOGENOUS OBESITY 12/14/2006  . FIBROMYALGIA 12/14/2006  . Gallstones   . Hearing loss of left ear   . HYPERLIPIDEMIA 12/14/2006  . HYPERTENSION 12/14/2006  . IBS (irritable bowel syndrome)   . Neurosarcoidosis   . Sinusitis nasal 06/24/2016  . Sleep apnea    no CPAP  . Vision loss of right eye      Social History   Social History  . Marital status: Married    Spouse name: N/A  . Number of children: 0  . Years of education: hs   Occupational History  . disabled   . clerical    Social History Main Topics  . Smoking status: Never Smoker  . Smokeless tobacco: Never Used  . Alcohol use No     Comment: One drink per year  . Drug use: No  . Sexual activity: Not on file   Other Topics Concern  . Not on file   Social History Narrative   Married no children 2 caffeinated beverages a day    Past Surgical History:  Procedure Laterality Date  . BRAIN BIOPSY     [facility]  .  CERVICAL FUSION    . CHOLECYSTECTOMY    . COLONOSCOPY    . LUMBAR FUSION  07/25/2012  . TONSILLECTOMY      Family History  Problem Relation Age of Onset  . Diabetes Sister   . Hypertension Mother   . Emphysema Father   . Colon cancer Neg Hx   . Esophageal cancer Neg Hx   . Rectal cancer Neg Hx   . Stomach cancer Neg Hx     Allergies  Allergen Reactions  . Adhesive [Tape] Rash  . Latex Anaphylaxis and Rash  . Cyclophosphamide Other (See Comments)    Caused diverticulitis.  She would not eat or drink for days, while taking it.    Current Outpatient Prescriptions on File Prior to Visit  Medication Sig Dispense Refill  . aspirin 81 MG tablet Take 81 mg by mouth daily.      Marland Kitchen atorvastatin (LIPITOR) 40 MG tablet Take 1 tablet (40 mg total) by mouth daily. 90 tablet 3  . brimonidine (ALPHAGAN) 0.15 % ophthalmic solution Place 1 drop into the left eye 2 (two) times daily.     . cetirizine (ZYRTEC) 10 MG tablet Take 10 mg by mouth daily.      . Cholecalciferol (VITAMIN D3) 2000 UNITS TABS Take  1 tablet by mouth daily.    Marland Kitchen ENSURE (ENSURE) Take 1 Can by mouth daily.    . fesoterodine (TOVIAZ) 8 MG TB24 tablet Take 8 mg by mouth daily.    . fluticasone (FLONASE) 50 MCG/ACT nasal spray Place 1 spray into both nostrils daily as needed for allergies.     . hyoscyamine (LEVSIN SL) 0.125 MG SL tablet Place 1 tablet (0.125 mg total) under the tongue every 4 (four) hours as needed. 60 tablet 5  . lisinopril (PRINIVIL,ZESTRIL) 40 MG tablet Take 1 tablet (40 mg total) by mouth daily. 90 tablet 2  . loperamide (IMODIUM A-D) 2 MG tablet Take 2 mg by mouth 4 (four) times daily as needed for diarrhea or loose stools.    . nitrofurantoin (MACRODANTIN) 100 MG capsule Take 1 capsule by mouth daily.    . Nutritional Supplements (ENSURE NUTRITION SHAKE) LIQD Take 1 Bottle by mouth daily as needed. For nutrition    . omeprazole (PRILOSEC) 20 MG capsule TAKE 1 CAPSULE ONE TIME DAILY 90 capsule 3  .  Potassium Gluconate ([employer]S POTASSIUM GLUCONATE) 2 MEQ TABS Take 1 tablet by mouth daily.     . predniSONE (DELTASONE) 2.5 MG tablet Take 1 tablet (2.5 mg total) by mouth daily. 90 tablet 2  . albuterol (PROVENTIL HFA;VENTOLIN HFA) 108 (90 BASE) MCG/ACT inhaler Inhale 2 puffs into the lungs every 6 (six) hours as needed. Reported on 09/24/2015    . MYRBETRIQ 50 MG TB24 tablet Take 1 tablet (50 mg total) by mouth daily. (Patient not taking: Reported on 10/14/2016) 90 tablet 2   No current facility-administered medications on file prior to visit.     BP (!) 142/80 (BP Location: Left Arm, Patient Position: Sitting, Cuff Size: Normal)   Pulse 84   Temp 97.7 F (36.5 C) (Oral)   Ht 5' (1.524 m)   Wt 173 lb 6.4 oz (78.7 kg)   SpO2 96%   BMI 33.86 kg/m     Review of Systems  Constitutional: Positive for activity change and fatigue.  Musculoskeletal: Positive for back pain and gait problem.  Psychiatric/Behavioral: Positive for behavioral problems and sleep disturbance. The patient is nervous/anxious.        Objective:   Physical Exam  Constitutional: She is oriented to person, place, and time. She appears well-developed and well-nourished.  Overweight Uses walker Blood pressure normal  HENT:  Head: Normocephalic.  Right Ear: External ear normal.  Left Ear: External ear normal.  Mouth/Throat: Oropharynx is clear and moist.  Eyes: Conjunctivae and EOM are normal. Pupils are equal, round, and reactive to light.  Neck: Normal range of motion. Neck supple. No thyromegaly present.  Cardiovascular: Normal rate, regular rhythm, normal heart sounds and intact distal pulses.   Pulmonary/Chest: Effort normal and breath sounds normal.  Abdominal: Soft. Bowel sounds are normal. She exhibits no mass. There is no tenderness.  Musculoskeletal: Normal range of motion.  Lymphadenopathy:    She has no cervical adenopathy.  Neurological: She is alert and oriented to person, place, and time.    hyperreflexia  Skin: Skin is warm and dry. No rash noted.  Psychiatric: She has a normal mood and affect. Her behavior is normal.  tearful          Assessment & Plan:   Chronic low back pain secondary to spondylosis and spinal stenosis.  Medically stable for surgical decompression Neurosarcoidosis.  Patient is scheduled for follow-up at [facility] next month.  Continue low-dose prednisone therapy Situational anxiety/insomnia.  Prescription for lorazepam, dispensed Essential hypertension, stable  Follow-up 3 months  [id number],PETER Pilar Plate

## 2016-10-14 NOTE — Patient Instructions (Signed)
Lorazepam 1 half tablet in the morning and 1 full tablet in the evening to assist with sleep as needed  Limit your sodium (Salt) intake  Please check your blood pressure on a regular basis.  If it is consistently greater than 150/90, please make an office appointment.  Return in 3 months for follow-up

## 2016-10-14 NOTE — Progress Notes (Signed)
Pre visit review using our clinic review tool, if applicable. No additional management support is needed unless otherwise documented below in the visit note. 

## 2016-10-18 DIAGNOSIS — M48062 Spinal stenosis, lumbar region with neurogenic claudication: Secondary | ICD-10-CM | POA: Diagnosis not present

## 2016-10-18 DIAGNOSIS — M5135 Other intervertebral disc degeneration, thoracolumbar region: Secondary | ICD-10-CM | POA: Diagnosis not present

## 2016-10-18 DIAGNOSIS — Z01818 Encounter for other preprocedural examination: Secondary | ICD-10-CM | POA: Diagnosis not present

## 2016-10-18 DIAGNOSIS — Z5189 Encounter for other specified aftercare: Secondary | ICD-10-CM | POA: Diagnosis not present

## 2016-10-25 DIAGNOSIS — H5461 Unqualified visual loss, right eye, normal vision left eye: Secondary | ICD-10-CM | POA: Diagnosis not present

## 2016-10-25 DIAGNOSIS — Z4789 Encounter for other orthopedic aftercare: Secondary | ICD-10-CM | POA: Diagnosis not present

## 2016-10-25 DIAGNOSIS — N319 Neuromuscular dysfunction of bladder, unspecified: Secondary | ICD-10-CM | POA: Diagnosis not present

## 2016-10-25 DIAGNOSIS — I10 Essential (primary) hypertension: Secondary | ICD-10-CM | POA: Diagnosis not present

## 2016-10-25 DIAGNOSIS — M48061 Spinal stenosis, lumbar region without neurogenic claudication: Secondary | ICD-10-CM | POA: Diagnosis not present

## 2016-10-25 DIAGNOSIS — M419 Scoliosis, unspecified: Secondary | ICD-10-CM | POA: Diagnosis not present

## 2016-10-25 DIAGNOSIS — M47816 Spondylosis without myelopathy or radiculopathy, lumbar region: Secondary | ICD-10-CM | POA: Diagnosis not present

## 2016-10-25 DIAGNOSIS — M4014 Other secondary kyphosis, thoracic region: Secondary | ICD-10-CM | POA: Diagnosis not present

## 2016-10-25 DIAGNOSIS — Z7409 Other reduced mobility: Secondary | ICD-10-CM | POA: Diagnosis not present

## 2016-10-25 DIAGNOSIS — Z856 Personal history of leukemia: Secondary | ICD-10-CM | POA: Diagnosis not present

## 2016-10-25 DIAGNOSIS — Z452 Encounter for adjustment and management of vascular access device: Secondary | ICD-10-CM | POA: Diagnosis not present

## 2016-10-25 DIAGNOSIS — M797 Fibromyalgia: Secondary | ICD-10-CM | POA: Diagnosis present

## 2016-10-25 DIAGNOSIS — G4730 Sleep apnea, unspecified: Secondary | ICD-10-CM | POA: Diagnosis present

## 2016-10-25 DIAGNOSIS — Z9181 History of falling: Secondary | ICD-10-CM | POA: Diagnosis not present

## 2016-10-25 DIAGNOSIS — J302 Other seasonal allergic rhinitis: Secondary | ICD-10-CM | POA: Diagnosis present

## 2016-10-25 DIAGNOSIS — C9590 Leukemia, unspecified not having achieved remission: Secondary | ICD-10-CM | POA: Diagnosis not present

## 2016-10-25 DIAGNOSIS — K589 Irritable bowel syndrome without diarrhea: Secondary | ICD-10-CM | POA: Diagnosis present

## 2016-10-25 DIAGNOSIS — L89152 Pressure ulcer of sacral region, stage 2: Secondary | ICD-10-CM | POA: Diagnosis not present

## 2016-10-25 DIAGNOSIS — M4325 Fusion of spine, thoracolumbar region: Secondary | ICD-10-CM | POA: Diagnosis not present

## 2016-10-25 DIAGNOSIS — L89313 Pressure ulcer of right buttock, stage 3: Secondary | ICD-10-CM | POA: Diagnosis not present

## 2016-10-25 DIAGNOSIS — K592 Neurogenic bowel, not elsewhere classified: Secondary | ICD-10-CM | POA: Diagnosis not present

## 2016-10-25 DIAGNOSIS — M4010 Other secondary kyphosis, site unspecified: Secondary | ICD-10-CM | POA: Diagnosis not present

## 2016-10-25 DIAGNOSIS — M4805 Spinal stenosis, thoracolumbar region: Secondary | ICD-10-CM | POA: Diagnosis present

## 2016-10-25 DIAGNOSIS — G9340 Encephalopathy, unspecified: Secondary | ICD-10-CM | POA: Diagnosis not present

## 2016-10-25 DIAGNOSIS — E785 Hyperlipidemia, unspecified: Secondary | ICD-10-CM | POA: Diagnosis present

## 2016-10-25 DIAGNOSIS — H9192 Unspecified hearing loss, left ear: Secondary | ICD-10-CM | POA: Diagnosis not present

## 2016-10-25 DIAGNOSIS — M5135 Other intervertebral disc degeneration, thoracolumbar region: Secondary | ICD-10-CM | POA: Diagnosis not present

## 2016-10-25 DIAGNOSIS — M4035 Flatback syndrome, thoracolumbar region: Secondary | ICD-10-CM | POA: Diagnosis not present

## 2016-10-25 DIAGNOSIS — H9193 Unspecified hearing loss, bilateral: Secondary | ICD-10-CM | POA: Diagnosis present

## 2016-10-25 DIAGNOSIS — Z981 Arthrodesis status: Secondary | ICD-10-CM | POA: Diagnosis not present

## 2016-10-25 DIAGNOSIS — M4716 Other spondylosis with myelopathy, lumbar region: Secondary | ICD-10-CM | POA: Diagnosis not present

## 2016-10-25 DIAGNOSIS — M961 Postlaminectomy syndrome, not elsewhere classified: Secondary | ICD-10-CM | POA: Diagnosis not present

## 2016-10-25 DIAGNOSIS — L89303 Pressure ulcer of unspecified buttock, stage 3: Secondary | ICD-10-CM | POA: Diagnosis present

## 2016-10-25 DIAGNOSIS — M5136 Other intervertebral disc degeneration, lumbar region: Secondary | ICD-10-CM | POA: Diagnosis not present

## 2016-10-25 DIAGNOSIS — K219 Gastro-esophageal reflux disease without esophagitis: Secondary | ICD-10-CM | POA: Diagnosis present

## 2016-10-25 DIAGNOSIS — M48062 Spinal stenosis, lumbar region with neurogenic claudication: Secondary | ICD-10-CM | POA: Diagnosis not present

## 2016-10-28 DIAGNOSIS — L89313 Pressure ulcer of right buttock, stage 3: Secondary | ICD-10-CM | POA: Diagnosis not present

## 2016-10-28 DIAGNOSIS — M546 Pain in thoracic spine: Secondary | ICD-10-CM | POA: Diagnosis not present

## 2016-10-28 DIAGNOSIS — D869 Sarcoidosis, unspecified: Secondary | ICD-10-CM | POA: Diagnosis not present

## 2016-10-28 DIAGNOSIS — H9192 Unspecified hearing loss, left ear: Secondary | ICD-10-CM | POA: Diagnosis not present

## 2016-10-28 DIAGNOSIS — J302 Other seasonal allergic rhinitis: Secondary | ICD-10-CM | POA: Diagnosis present

## 2016-10-28 DIAGNOSIS — Z7409 Other reduced mobility: Secondary | ICD-10-CM | POA: Diagnosis not present

## 2016-10-28 DIAGNOSIS — M62838 Other muscle spasm: Secondary | ICD-10-CM | POA: Diagnosis not present

## 2016-10-28 DIAGNOSIS — M4805 Spinal stenosis, thoracolumbar region: Secondary | ICD-10-CM | POA: Diagnosis present

## 2016-10-28 DIAGNOSIS — R4182 Altered mental status, unspecified: Secondary | ICD-10-CM | POA: Diagnosis not present

## 2016-10-28 DIAGNOSIS — I739 Peripheral vascular disease, unspecified: Secondary | ICD-10-CM | POA: Diagnosis present

## 2016-10-28 DIAGNOSIS — M961 Postlaminectomy syndrome, not elsewhere classified: Secondary | ICD-10-CM | POA: Diagnosis not present

## 2016-10-28 DIAGNOSIS — R2681 Unsteadiness on feet: Secondary | ICD-10-CM | POA: Diagnosis not present

## 2016-10-28 DIAGNOSIS — R278 Other lack of coordination: Secondary | ICD-10-CM | POA: Diagnosis not present

## 2016-10-28 DIAGNOSIS — Z5189 Encounter for other specified aftercare: Secondary | ICD-10-CM | POA: Diagnosis not present

## 2016-10-28 DIAGNOSIS — L89312 Pressure ulcer of right buttock, stage 2: Secondary | ICD-10-CM | POA: Diagnosis not present

## 2016-10-28 DIAGNOSIS — C9590 Leukemia, unspecified not having achieved remission: Secondary | ICD-10-CM | POA: Diagnosis present

## 2016-10-28 DIAGNOSIS — D8689 Sarcoidosis of other sites: Secondary | ICD-10-CM | POA: Diagnosis not present

## 2016-10-28 DIAGNOSIS — R32 Unspecified urinary incontinence: Secondary | ICD-10-CM | POA: Diagnosis not present

## 2016-10-28 DIAGNOSIS — R41841 Cognitive communication deficit: Secondary | ICD-10-CM | POA: Diagnosis not present

## 2016-10-28 DIAGNOSIS — M4035 Flatback syndrome, thoracolumbar region: Secondary | ICD-10-CM | POA: Diagnosis not present

## 2016-10-28 DIAGNOSIS — L89152 Pressure ulcer of sacral region, stage 2: Secondary | ICD-10-CM | POA: Diagnosis not present

## 2016-10-28 DIAGNOSIS — M549 Dorsalgia, unspecified: Secondary | ICD-10-CM | POA: Diagnosis not present

## 2016-10-28 DIAGNOSIS — Z9181 History of falling: Secondary | ICD-10-CM | POA: Diagnosis not present

## 2016-10-28 DIAGNOSIS — C7940 Secondary malignant neoplasm of unspecified part of nervous system: Secondary | ICD-10-CM | POA: Diagnosis not present

## 2016-10-28 DIAGNOSIS — G9340 Encephalopathy, unspecified: Secondary | ICD-10-CM | POA: Diagnosis not present

## 2016-10-28 DIAGNOSIS — N319 Neuromuscular dysfunction of bladder, unspecified: Secondary | ICD-10-CM | POA: Diagnosis not present

## 2016-10-28 DIAGNOSIS — I10 Essential (primary) hypertension: Secondary | ICD-10-CM | POA: Diagnosis not present

## 2016-10-28 DIAGNOSIS — Z4789 Encounter for other orthopedic aftercare: Secondary | ICD-10-CM | POA: Diagnosis not present

## 2016-10-28 DIAGNOSIS — K589 Irritable bowel syndrome without diarrhea: Secondary | ICD-10-CM | POA: Diagnosis present

## 2016-10-28 DIAGNOSIS — G8929 Other chronic pain: Secondary | ICD-10-CM | POA: Diagnosis not present

## 2016-10-28 DIAGNOSIS — F329 Major depressive disorder, single episode, unspecified: Secondary | ICD-10-CM | POA: Diagnosis present

## 2016-10-28 DIAGNOSIS — M4325 Fusion of spine, thoracolumbar region: Secondary | ICD-10-CM | POA: Diagnosis not present

## 2016-10-28 DIAGNOSIS — M4326 Fusion of spine, lumbar region: Secondary | ICD-10-CM | POA: Diagnosis not present

## 2016-10-28 DIAGNOSIS — M419 Scoliosis, unspecified: Secondary | ICD-10-CM | POA: Diagnosis not present

## 2016-10-28 DIAGNOSIS — M797 Fibromyalgia: Secondary | ICD-10-CM | POA: Diagnosis present

## 2016-10-28 DIAGNOSIS — K219 Gastro-esophageal reflux disease without esophagitis: Secondary | ICD-10-CM | POA: Diagnosis not present

## 2016-10-28 DIAGNOSIS — R52 Pain, unspecified: Secondary | ICD-10-CM | POA: Diagnosis not present

## 2016-10-28 DIAGNOSIS — K592 Neurogenic bowel, not elsewhere classified: Secondary | ICD-10-CM | POA: Diagnosis present

## 2016-10-28 DIAGNOSIS — M40209 Unspecified kyphosis, site unspecified: Secondary | ICD-10-CM | POA: Diagnosis not present

## 2016-10-28 DIAGNOSIS — E785 Hyperlipidemia, unspecified: Secondary | ICD-10-CM | POA: Diagnosis not present

## 2016-10-28 DIAGNOSIS — I2510 Atherosclerotic heart disease of native coronary artery without angina pectoris: Secondary | ICD-10-CM | POA: Diagnosis not present

## 2016-10-28 DIAGNOSIS — M545 Low back pain: Secondary | ICD-10-CM | POA: Diagnosis not present

## 2016-10-28 DIAGNOSIS — M48061 Spinal stenosis, lumbar region without neurogenic claudication: Secondary | ICD-10-CM | POA: Diagnosis not present

## 2016-10-28 DIAGNOSIS — K922 Gastrointestinal hemorrhage, unspecified: Secondary | ICD-10-CM | POA: Diagnosis not present

## 2016-10-28 DIAGNOSIS — Z8249 Family history of ischemic heart disease and other diseases of the circulatory system: Secondary | ICD-10-CM | POA: Diagnosis not present

## 2016-10-28 DIAGNOSIS — H5461 Unqualified visual loss, right eye, normal vision left eye: Secondary | ICD-10-CM | POA: Diagnosis not present

## 2016-10-28 DIAGNOSIS — M6281 Muscle weakness (generalized): Secondary | ICD-10-CM | POA: Diagnosis not present

## 2016-10-28 DIAGNOSIS — Z981 Arthrodesis status: Secondary | ICD-10-CM | POA: Diagnosis not present

## 2016-10-28 DIAGNOSIS — R5383 Other fatigue: Secondary | ICD-10-CM | POA: Diagnosis not present

## 2016-10-28 DIAGNOSIS — M5135 Other intervertebral disc degeneration, thoracolumbar region: Secondary | ICD-10-CM | POA: Diagnosis present

## 2016-10-28 DIAGNOSIS — M48062 Spinal stenosis, lumbar region with neurogenic claudication: Secondary | ICD-10-CM | POA: Diagnosis not present

## 2016-10-28 DIAGNOSIS — M9921 Subluxation stenosis of neural canal of cervical region: Secondary | ICD-10-CM | POA: Diagnosis not present

## 2016-11-09 DIAGNOSIS — T8131XA Disruption of external operation (surgical) wound, not elsewhere classified, initial encounter: Secondary | ICD-10-CM | POA: Diagnosis not present

## 2016-11-09 DIAGNOSIS — M62838 Other muscle spasm: Secondary | ICD-10-CM | POA: Diagnosis not present

## 2016-11-09 DIAGNOSIS — F329 Major depressive disorder, single episode, unspecified: Secondary | ICD-10-CM | POA: Diagnosis not present

## 2016-11-09 DIAGNOSIS — M4325 Fusion of spine, thoracolumbar region: Secondary | ICD-10-CM | POA: Diagnosis not present

## 2016-11-09 DIAGNOSIS — E785 Hyperlipidemia, unspecified: Secondary | ICD-10-CM | POA: Diagnosis not present

## 2016-11-09 DIAGNOSIS — E860 Dehydration: Secondary | ICD-10-CM | POA: Diagnosis present

## 2016-11-09 DIAGNOSIS — M4805 Spinal stenosis, thoracolumbar region: Secondary | ICD-10-CM | POA: Diagnosis not present

## 2016-11-09 DIAGNOSIS — C9190 Lymphoid leukemia, unspecified not having achieved remission: Secondary | ICD-10-CM | POA: Diagnosis not present

## 2016-11-09 DIAGNOSIS — E872 Acidosis: Secondary | ICD-10-CM | POA: Diagnosis present

## 2016-11-09 DIAGNOSIS — M4035 Flatback syndrome, thoracolumbar region: Secondary | ICD-10-CM | POA: Diagnosis not present

## 2016-11-09 DIAGNOSIS — M961 Postlaminectomy syndrome, not elsewhere classified: Secondary | ICD-10-CM | POA: Diagnosis not present

## 2016-11-09 DIAGNOSIS — R52 Pain, unspecified: Secondary | ICD-10-CM | POA: Diagnosis not present

## 2016-11-09 DIAGNOSIS — K922 Gastrointestinal hemorrhage, unspecified: Secondary | ICD-10-CM | POA: Diagnosis not present

## 2016-11-09 DIAGNOSIS — M9921 Subluxation stenosis of neural canal of cervical region: Secondary | ICD-10-CM | POA: Diagnosis not present

## 2016-11-09 DIAGNOSIS — Z4789 Encounter for other orthopedic aftercare: Secondary | ICD-10-CM | POA: Diagnosis not present

## 2016-11-09 DIAGNOSIS — L89312 Pressure ulcer of right buttock, stage 2: Secondary | ICD-10-CM | POA: Diagnosis not present

## 2016-11-09 DIAGNOSIS — T8189XA Other complications of procedures, not elsewhere classified, initial encounter: Secondary | ICD-10-CM | POA: Diagnosis not present

## 2016-11-09 DIAGNOSIS — R2681 Unsteadiness on feet: Secondary | ICD-10-CM | POA: Diagnosis not present

## 2016-11-09 DIAGNOSIS — I10 Essential (primary) hypertension: Secondary | ICD-10-CM | POA: Diagnosis not present

## 2016-11-09 DIAGNOSIS — Z5189 Encounter for other specified aftercare: Secondary | ICD-10-CM | POA: Diagnosis not present

## 2016-11-09 DIAGNOSIS — C9110 Chronic lymphocytic leukemia of B-cell type not having achieved remission: Secondary | ICD-10-CM | POA: Diagnosis present

## 2016-11-09 DIAGNOSIS — R269 Unspecified abnormalities of gait and mobility: Secondary | ICD-10-CM | POA: Diagnosis not present

## 2016-11-09 DIAGNOSIS — D8689 Sarcoidosis of other sites: Secondary | ICD-10-CM | POA: Diagnosis not present

## 2016-11-09 DIAGNOSIS — R443 Hallucinations, unspecified: Secondary | ICD-10-CM | POA: Diagnosis present

## 2016-11-09 DIAGNOSIS — R41841 Cognitive communication deficit: Secondary | ICD-10-CM | POA: Diagnosis not present

## 2016-11-09 DIAGNOSIS — H9192 Unspecified hearing loss, left ear: Secondary | ICD-10-CM | POA: Diagnosis present

## 2016-11-09 DIAGNOSIS — R278 Other lack of coordination: Secondary | ICD-10-CM | POA: Diagnosis not present

## 2016-11-09 DIAGNOSIS — M4326 Fusion of spine, lumbar region: Secondary | ICD-10-CM | POA: Diagnosis not present

## 2016-11-09 DIAGNOSIS — M797 Fibromyalgia: Secondary | ICD-10-CM | POA: Diagnosis present

## 2016-11-09 DIAGNOSIS — Z9889 Other specified postprocedural states: Secondary | ICD-10-CM | POA: Diagnosis not present

## 2016-11-09 DIAGNOSIS — M549 Dorsalgia, unspecified: Secondary | ICD-10-CM | POA: Diagnosis not present

## 2016-11-09 DIAGNOSIS — Z6833 Body mass index (BMI) 33.0-33.9, adult: Secondary | ICD-10-CM | POA: Diagnosis not present

## 2016-11-09 DIAGNOSIS — M6281 Muscle weakness (generalized): Secondary | ICD-10-CM | POA: Diagnosis not present

## 2016-11-09 DIAGNOSIS — R000 Tachycardia, unspecified: Secondary | ICD-10-CM | POA: Diagnosis not present

## 2016-11-09 DIAGNOSIS — Z981 Arthrodesis status: Secondary | ICD-10-CM | POA: Diagnosis not present

## 2016-11-09 DIAGNOSIS — R410 Disorientation, unspecified: Secondary | ICD-10-CM | POA: Diagnosis not present

## 2016-11-09 DIAGNOSIS — E870 Hyperosmolality and hypernatremia: Secondary | ICD-10-CM | POA: Diagnosis present

## 2016-11-09 DIAGNOSIS — M48061 Spinal stenosis, lumbar region without neurogenic claudication: Secondary | ICD-10-CM | POA: Diagnosis not present

## 2016-11-09 DIAGNOSIS — T8189XD Other complications of procedures, not elsewhere classified, subsequent encounter: Secondary | ICD-10-CM | POA: Diagnosis not present

## 2016-11-09 DIAGNOSIS — F29 Unspecified psychosis not due to a substance or known physiological condition: Secondary | ICD-10-CM | POA: Diagnosis not present

## 2016-11-09 DIAGNOSIS — R32 Unspecified urinary incontinence: Secondary | ICD-10-CM | POA: Diagnosis not present

## 2016-11-09 DIAGNOSIS — R630 Anorexia: Secondary | ICD-10-CM | POA: Diagnosis present

## 2016-11-09 DIAGNOSIS — R7881 Bacteremia: Secondary | ICD-10-CM | POA: Diagnosis not present

## 2016-11-09 DIAGNOSIS — E876 Hypokalemia: Secondary | ICD-10-CM | POA: Diagnosis present

## 2016-11-09 DIAGNOSIS — D869 Sarcoidosis, unspecified: Secondary | ICD-10-CM | POA: Diagnosis not present

## 2016-11-09 DIAGNOSIS — H5461 Unqualified visual loss, right eye, normal vision left eye: Secondary | ICD-10-CM | POA: Diagnosis present

## 2016-11-09 DIAGNOSIS — M546 Pain in thoracic spine: Secondary | ICD-10-CM | POA: Diagnosis present

## 2016-11-09 DIAGNOSIS — N319 Neuromuscular dysfunction of bladder, unspecified: Secondary | ICD-10-CM | POA: Diagnosis not present

## 2016-11-09 DIAGNOSIS — E162 Hypoglycemia, unspecified: Secondary | ICD-10-CM | POA: Diagnosis not present

## 2016-11-09 DIAGNOSIS — R471 Dysarthria and anarthria: Secondary | ICD-10-CM | POA: Diagnosis present

## 2016-11-09 DIAGNOSIS — I2510 Atherosclerotic heart disease of native coronary artery without angina pectoris: Secondary | ICD-10-CM | POA: Diagnosis not present

## 2016-11-09 DIAGNOSIS — E6609 Other obesity due to excess calories: Secondary | ICD-10-CM | POA: Diagnosis present

## 2016-11-09 DIAGNOSIS — G9340 Encephalopathy, unspecified: Secondary | ICD-10-CM | POA: Diagnosis present

## 2016-11-09 DIAGNOSIS — N179 Acute kidney failure, unspecified: Secondary | ICD-10-CM | POA: Diagnosis not present

## 2016-11-09 DIAGNOSIS — G4730 Sleep apnea, unspecified: Secondary | ICD-10-CM | POA: Diagnosis present

## 2016-11-09 DIAGNOSIS — K589 Irritable bowel syndrome without diarrhea: Secondary | ICD-10-CM | POA: Diagnosis present

## 2016-11-09 DIAGNOSIS — K219 Gastro-esophageal reflux disease without esophagitis: Secondary | ICD-10-CM | POA: Diagnosis not present

## 2016-11-10 DIAGNOSIS — E785 Hyperlipidemia, unspecified: Secondary | ICD-10-CM | POA: Diagnosis not present

## 2016-11-10 DIAGNOSIS — R269 Unspecified abnormalities of gait and mobility: Secondary | ICD-10-CM | POA: Diagnosis not present

## 2016-11-10 DIAGNOSIS — D869 Sarcoidosis, unspecified: Secondary | ICD-10-CM | POA: Diagnosis not present

## 2016-11-10 DIAGNOSIS — I10 Essential (primary) hypertension: Secondary | ICD-10-CM | POA: Diagnosis not present

## 2016-11-15 DIAGNOSIS — T8131XA Disruption of external operation (surgical) wound, not elsewhere classified, initial encounter: Secondary | ICD-10-CM | POA: Diagnosis not present

## 2016-11-16 DIAGNOSIS — T8189XD Other complications of procedures, not elsewhere classified, subsequent encounter: Secondary | ICD-10-CM | POA: Diagnosis not present

## 2016-11-16 DIAGNOSIS — T8189XA Other complications of procedures, not elsewhere classified, initial encounter: Secondary | ICD-10-CM | POA: Diagnosis not present

## 2016-11-22 ENCOUNTER — Ambulatory Visit: Payer: Medicare Other | Admitting: Internal Medicine

## 2016-11-22 ENCOUNTER — Encounter: Payer: Self-pay | Admitting: Hematology and Oncology

## 2016-11-22 ENCOUNTER — Ambulatory Visit: Payer: Medicare Other | Admitting: Hematology and Oncology

## 2016-11-22 DIAGNOSIS — T8189XA Other complications of procedures, not elsewhere classified, initial encounter: Secondary | ICD-10-CM | POA: Diagnosis not present

## 2016-11-22 DIAGNOSIS — T8189XD Other complications of procedures, not elsewhere classified, subsequent encounter: Secondary | ICD-10-CM | POA: Diagnosis not present

## 2016-11-23 DIAGNOSIS — M4325 Fusion of spine, thoracolumbar region: Secondary | ICD-10-CM | POA: Diagnosis not present

## 2016-11-25 DIAGNOSIS — F29 Unspecified psychosis not due to a substance or known physiological condition: Secondary | ICD-10-CM | POA: Diagnosis not present

## 2016-11-25 DIAGNOSIS — M797 Fibromyalgia: Secondary | ICD-10-CM | POA: Diagnosis present

## 2016-11-25 DIAGNOSIS — D8689 Sarcoidosis of other sites: Secondary | ICD-10-CM | POA: Diagnosis present

## 2016-11-25 DIAGNOSIS — E6609 Other obesity due to excess calories: Secondary | ICD-10-CM | POA: Diagnosis present

## 2016-11-25 DIAGNOSIS — N179 Acute kidney failure, unspecified: Secondary | ICD-10-CM | POA: Diagnosis present

## 2016-11-25 DIAGNOSIS — R278 Other lack of coordination: Secondary | ICD-10-CM | POA: Diagnosis not present

## 2016-11-25 DIAGNOSIS — R7881 Bacteremia: Secondary | ICD-10-CM | POA: Diagnosis not present

## 2016-11-25 DIAGNOSIS — Z5189 Encounter for other specified aftercare: Secondary | ICD-10-CM | POA: Diagnosis not present

## 2016-11-25 DIAGNOSIS — E860 Dehydration: Secondary | ICD-10-CM | POA: Diagnosis present

## 2016-11-25 DIAGNOSIS — I10 Essential (primary) hypertension: Secondary | ICD-10-CM | POA: Diagnosis not present

## 2016-11-25 DIAGNOSIS — R2681 Unsteadiness on feet: Secondary | ICD-10-CM | POA: Diagnosis not present

## 2016-11-25 DIAGNOSIS — E870 Hyperosmolality and hypernatremia: Secondary | ICD-10-CM | POA: Diagnosis present

## 2016-11-25 DIAGNOSIS — Z6833 Body mass index (BMI) 33.0-33.9, adult: Secondary | ICD-10-CM | POA: Diagnosis not present

## 2016-11-25 DIAGNOSIS — E872 Acidosis: Secondary | ICD-10-CM | POA: Diagnosis present

## 2016-11-25 DIAGNOSIS — M546 Pain in thoracic spine: Secondary | ICD-10-CM | POA: Diagnosis present

## 2016-11-25 DIAGNOSIS — R471 Dysarthria and anarthria: Secondary | ICD-10-CM | POA: Diagnosis present

## 2016-11-25 DIAGNOSIS — C9110 Chronic lymphocytic leukemia of B-cell type not having achieved remission: Secondary | ICD-10-CM | POA: Diagnosis present

## 2016-11-25 DIAGNOSIS — R41841 Cognitive communication deficit: Secondary | ICD-10-CM | POA: Diagnosis not present

## 2016-11-25 DIAGNOSIS — M4805 Spinal stenosis, thoracolumbar region: Secondary | ICD-10-CM | POA: Diagnosis not present

## 2016-11-25 DIAGNOSIS — R443 Hallucinations, unspecified: Secondary | ICD-10-CM | POA: Diagnosis present

## 2016-11-25 DIAGNOSIS — H9192 Unspecified hearing loss, left ear: Secondary | ICD-10-CM | POA: Diagnosis present

## 2016-11-25 DIAGNOSIS — G9340 Encephalopathy, unspecified: Secondary | ICD-10-CM | POA: Diagnosis present

## 2016-11-25 DIAGNOSIS — I2510 Atherosclerotic heart disease of native coronary artery without angina pectoris: Secondary | ICD-10-CM | POA: Diagnosis not present

## 2016-11-25 DIAGNOSIS — R000 Tachycardia, unspecified: Secondary | ICD-10-CM | POA: Diagnosis not present

## 2016-11-25 DIAGNOSIS — R32 Unspecified urinary incontinence: Secondary | ICD-10-CM | POA: Diagnosis not present

## 2016-11-25 DIAGNOSIS — Z981 Arthrodesis status: Secondary | ICD-10-CM | POA: Diagnosis not present

## 2016-11-25 DIAGNOSIS — C9190 Lymphoid leukemia, unspecified not having achieved remission: Secondary | ICD-10-CM | POA: Diagnosis not present

## 2016-11-25 DIAGNOSIS — H5461 Unqualified visual loss, right eye, normal vision left eye: Secondary | ICD-10-CM | POA: Diagnosis present

## 2016-11-25 DIAGNOSIS — F329 Major depressive disorder, single episode, unspecified: Secondary | ICD-10-CM | POA: Diagnosis not present

## 2016-11-25 DIAGNOSIS — G4730 Sleep apnea, unspecified: Secondary | ICD-10-CM | POA: Diagnosis present

## 2016-11-25 DIAGNOSIS — T8131XA Disruption of external operation (surgical) wound, not elsewhere classified, initial encounter: Secondary | ICD-10-CM | POA: Diagnosis not present

## 2016-11-25 DIAGNOSIS — K589 Irritable bowel syndrome without diarrhea: Secondary | ICD-10-CM | POA: Diagnosis present

## 2016-11-25 DIAGNOSIS — R630 Anorexia: Secondary | ICD-10-CM | POA: Diagnosis present

## 2016-11-25 DIAGNOSIS — E162 Hypoglycemia, unspecified: Secondary | ICD-10-CM | POA: Diagnosis not present

## 2016-11-25 DIAGNOSIS — R410 Disorientation, unspecified: Secondary | ICD-10-CM | POA: Diagnosis present

## 2016-11-25 DIAGNOSIS — Z9889 Other specified postprocedural states: Secondary | ICD-10-CM | POA: Diagnosis not present

## 2016-11-25 DIAGNOSIS — E785 Hyperlipidemia, unspecified: Secondary | ICD-10-CM | POA: Diagnosis present

## 2016-11-25 DIAGNOSIS — M6281 Muscle weakness (generalized): Secondary | ICD-10-CM | POA: Diagnosis not present

## 2016-11-25 DIAGNOSIS — E876 Hypokalemia: Secondary | ICD-10-CM | POA: Diagnosis present

## 2016-11-27 DIAGNOSIS — T8131XA Disruption of external operation (surgical) wound, not elsewhere classified, initial encounter: Secondary | ICD-10-CM | POA: Diagnosis not present

## 2016-11-27 DIAGNOSIS — N179 Acute kidney failure, unspecified: Secondary | ICD-10-CM | POA: Diagnosis not present

## 2016-11-27 DIAGNOSIS — R443 Hallucinations, unspecified: Secondary | ICD-10-CM | POA: Diagnosis not present

## 2016-11-28 ENCOUNTER — Encounter (HOSPITAL_COMMUNITY): Payer: Self-pay | Admitting: Emergency Medicine

## 2016-11-28 ENCOUNTER — Emergency Department (HOSPITAL_COMMUNITY): Payer: Medicare Other

## 2016-11-28 ENCOUNTER — Inpatient Hospital Stay (HOSPITAL_COMMUNITY)
Admission: EM | Admit: 2016-11-28 | Discharge: 2016-12-05 | DRG: 071 | Disposition: A | Discharge status: Skilled Nursing Facility | Payer: Medicare Other | Attending: Internal Medicine | Admitting: Internal Medicine

## 2016-11-28 DIAGNOSIS — N179 Acute kidney failure, unspecified: Secondary | ICD-10-CM | POA: Diagnosis not present

## 2016-11-28 DIAGNOSIS — Z9049 Acquired absence of other specified parts of digestive tract: Secondary | ICD-10-CM

## 2016-11-28 DIAGNOSIS — Z7982 Long term (current) use of aspirin: Secondary | ICD-10-CM

## 2016-11-28 DIAGNOSIS — G9340 Encephalopathy, unspecified: Secondary | ICD-10-CM

## 2016-11-28 DIAGNOSIS — E870 Hyperosmolality and hypernatremia: Secondary | ICD-10-CM | POA: Diagnosis present

## 2016-11-28 DIAGNOSIS — Z91040 Latex allergy status: Secondary | ICD-10-CM

## 2016-11-28 DIAGNOSIS — G4730 Sleep apnea, unspecified: Secondary | ICD-10-CM | POA: Diagnosis present

## 2016-11-28 DIAGNOSIS — H5461 Unqualified visual loss, right eye, normal vision left eye: Secondary | ICD-10-CM | POA: Diagnosis present

## 2016-11-28 DIAGNOSIS — Z881 Allergy status to other antibiotic agents status: Secondary | ICD-10-CM

## 2016-11-28 DIAGNOSIS — Z91048 Other nonmedicinal substance allergy status: Secondary | ICD-10-CM

## 2016-11-28 DIAGNOSIS — R410 Disorientation, unspecified: Secondary | ICD-10-CM | POA: Diagnosis not present

## 2016-11-28 DIAGNOSIS — E162 Hypoglycemia, unspecified: Secondary | ICD-10-CM | POA: Diagnosis not present

## 2016-11-28 DIAGNOSIS — E876 Hypokalemia: Secondary | ICD-10-CM | POA: Diagnosis present

## 2016-11-28 DIAGNOSIS — E785 Hyperlipidemia, unspecified: Secondary | ICD-10-CM | POA: Diagnosis present

## 2016-11-28 DIAGNOSIS — R000 Tachycardia, unspecified: Secondary | ICD-10-CM | POA: Diagnosis not present

## 2016-11-28 DIAGNOSIS — Z888 Allergy status to other drugs, medicaments and biological substances status: Secondary | ICD-10-CM

## 2016-11-28 DIAGNOSIS — E860 Dehydration: Secondary | ICD-10-CM | POA: Diagnosis present

## 2016-11-28 DIAGNOSIS — R443 Hallucinations, unspecified: Secondary | ICD-10-CM | POA: Diagnosis present

## 2016-11-28 DIAGNOSIS — Z79899 Other long term (current) drug therapy: Secondary | ICD-10-CM

## 2016-11-28 DIAGNOSIS — Z7952 Long term (current) use of systemic steroids: Secondary | ICD-10-CM

## 2016-11-28 DIAGNOSIS — D8689 Sarcoidosis of other sites: Secondary | ICD-10-CM | POA: Diagnosis present

## 2016-11-28 DIAGNOSIS — E872 Acidosis: Secondary | ICD-10-CM | POA: Diagnosis not present

## 2016-11-28 DIAGNOSIS — M797 Fibromyalgia: Secondary | ICD-10-CM | POA: Diagnosis present

## 2016-11-28 DIAGNOSIS — R7881 Bacteremia: Secondary | ICD-10-CM | POA: Diagnosis present

## 2016-11-28 DIAGNOSIS — R630 Anorexia: Secondary | ICD-10-CM | POA: Diagnosis present

## 2016-11-28 DIAGNOSIS — I10 Essential (primary) hypertension: Secondary | ICD-10-CM | POA: Diagnosis present

## 2016-11-28 DIAGNOSIS — Z8249 Family history of ischemic heart disease and other diseases of the circulatory system: Secondary | ICD-10-CM

## 2016-11-28 DIAGNOSIS — M549 Dorsalgia, unspecified: Secondary | ICD-10-CM

## 2016-11-28 DIAGNOSIS — Z9889 Other specified postprocedural states: Secondary | ICD-10-CM | POA: Diagnosis not present

## 2016-11-28 DIAGNOSIS — C9110 Chronic lymphocytic leukemia of B-cell type not having achieved remission: Secondary | ICD-10-CM | POA: Diagnosis present

## 2016-11-28 DIAGNOSIS — M546 Pain in thoracic spine: Secondary | ICD-10-CM | POA: Diagnosis present

## 2016-11-28 DIAGNOSIS — Z981 Arthrodesis status: Secondary | ICD-10-CM

## 2016-11-28 DIAGNOSIS — R471 Dysarthria and anarthria: Secondary | ICD-10-CM | POA: Diagnosis present

## 2016-11-28 DIAGNOSIS — H9192 Unspecified hearing loss, left ear: Secondary | ICD-10-CM | POA: Diagnosis present

## 2016-11-28 DIAGNOSIS — E6609 Other obesity due to excess calories: Secondary | ICD-10-CM | POA: Diagnosis present

## 2016-11-28 DIAGNOSIS — Z6833 Body mass index (BMI) 33.0-33.9, adult: Secondary | ICD-10-CM

## 2016-11-28 DIAGNOSIS — M4325 Fusion of spine, thoracolumbar region: Secondary | ICD-10-CM | POA: Diagnosis not present

## 2016-11-28 DIAGNOSIS — K589 Irritable bowel syndrome without diarrhea: Secondary | ICD-10-CM | POA: Diagnosis present

## 2016-11-28 LAB — URINALYSIS, ROUTINE W REFLEX MICROSCOPIC
Bilirubin Urine: NEGATIVE
Glucose, UA: NEGATIVE mg/dL
Hgb urine dipstick: NEGATIVE
Ketones, ur: 5 mg/dL — AB
Leukocytes, UA: NEGATIVE
Nitrite: NEGATIVE
Protein, ur: NEGATIVE mg/dL
Specific Gravity, Urine: 1.016 (ref 1.005–1.030)
pH: 5 (ref 5.0–8.0)

## 2016-11-28 LAB — COMPREHENSIVE METABOLIC PANEL
ALT: 20 U/L (ref 14–54)
AST: 34 U/L (ref 15–41)
Albumin: 3.8 g/dL (ref 3.5–5.0)
Alkaline Phosphatase: 122 U/L (ref 38–126)
Anion gap: 13 (ref 5–15)
BUN: 51 mg/dL — ABNORMAL HIGH (ref 6–20)
CO2: 18 mmol/L — ABNORMAL LOW (ref 22–32)
Calcium: 9.5 mg/dL (ref 8.9–10.3)
Chloride: 112 mmol/L — ABNORMAL HIGH (ref 101–111)
Creatinine, Ser: 1.95 mg/dL — ABNORMAL HIGH (ref 0.44–1.00)
GFR calc Af Amer: 29 mL/min — ABNORMAL LOW (ref >60)
GFR calc non Af Amer: 25 mL/min — ABNORMAL LOW (ref >60)
Glucose, Bld: 112 mg/dL — ABNORMAL HIGH (ref 65–99)
Potassium: 4.7 mmol/L (ref 3.5–5.1)
Sodium: 143 mmol/L (ref 135–145)
Total Bilirubin: 0.6 mg/dL (ref 0.3–1.2)
Total Protein: 6.2 g/dL — ABNORMAL LOW (ref 6.5–8.1)

## 2016-11-28 LAB — CBC WITH DIFFERENTIAL/PLATELET
Basophils Absolute: 0 10*3/uL (ref 0.0–0.1)
Basophils Relative: 0 %
Eosinophils Absolute: 0 10*3/uL (ref 0.0–0.7)
Eosinophils Relative: 1 %
HCT: 36.4 % (ref 36.0–46.0)
Hemoglobin: 11.9 g/dL — ABNORMAL LOW (ref 12.0–15.0)
Lymphocytes Relative: 19 %
Lymphs Abs: 1.5 10*3/uL (ref 0.7–4.0)
MCH: 27.4 pg (ref 26.0–34.0)
MCHC: 32.7 g/dL (ref 30.0–36.0)
MCV: 83.9 fL (ref 78.0–100.0)
Monocytes Absolute: 0.5 10*3/uL (ref 0.1–1.0)
Monocytes Relative: 7 %
Neutro Abs: 5.8 10*3/uL (ref 1.7–7.7)
Neutrophils Relative %: 73 %
Platelets: 235 10*3/uL (ref 150–400)
RBC: 4.34 MIL/uL (ref 3.87–5.11)
RDW: 15.2 % (ref 11.5–15.5)
WBC: 7.9 10*3/uL (ref 4.0–10.5)

## 2016-11-28 LAB — CBG MONITORING, ED: Glucose-Capillary: 102 mg/dL — ABNORMAL HIGH (ref 65–99)

## 2016-11-28 LAB — I-STAT CG4 LACTIC ACID, ED: Lactic Acid, Venous: 1.73 mmol/L (ref 0.5–1.9)

## 2016-11-28 MED ORDER — SODIUM CHLORIDE 0.9 % IV BOLUS (SEPSIS)
1000.0000 mL | Freq: Once | INTRAVENOUS | Status: AC
  Administered 2016-11-28: 1000 mL via INTRAVENOUS

## 2016-11-28 NOTE — ED Provider Notes (Signed)
[facility] DEPT Provider Note   CSN: [id number] Arrival date & time: 11/28/16  1925     History   Chief Complaint Chief Complaint  Patient presents with  . Altered Mental Status    HPI Sheryl [name] is a 71 y.o. female.  71yo F w/ PMH including HTN, HLD, fibromyalgia, CLL, recent lumbar spine surgery who p/w altered mental status. Patient was sent from her rehabilitation facility where she has reportedly been confused and hallucinating since yesterday. She had lumbar back surgery last month and notes that she had an area of her skin incision that had opened up and is being managed with wound care. She states that she had urinary tract infection recently and completed antibiotics. She states she no longer is having any burning with urination or other urinary symptoms. She reports postsurgical back pain but denies any chest pain, breathing problems, cough, or nausea.  LEVEL 5 CAVEAT DUE TO AMS      Past Medical History:  Diagnosis Date  . Allergy   . Cataract   . 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 DEGENERATIVE JOINT DISEASE 12/14/2006  . EXOGENOUS OBESITY 12/14/2006  . FIBROMYALGIA 12/14/2006  . Gallstones   . Hearing loss of left ear   . HYPERLIPIDEMIA 12/14/2006  . HYPERTENSION 12/14/2006  . IBS (irritable bowel syndrome)   . Neurosarcoidosis   . Sinusitis nasal 06/24/2016  . Sleep apnea    no CPAP  . Vision loss of right eye     Patient Active Problem List   Diagnosis Date Noted  . Sinusitis nasal 06/24/2016  . History of IBS 10/31/2014  . 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 Back pain 03/26/2014  . Preventive measure 03/26/2014  . Spinal stenosis 04/17/2012  . 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 Chronic lymphocytic leukemia ([place]) 12/14/2006  . Dyslipidemia 12/14/2006  . EXOGENOUS OBESITY 12/14/2006  . Essential hypertension 12/14/2006  . Osteoarthritis 12/14/2006  . FIBROMYALGIA 12/14/2006     Past Surgical History:  Procedure Laterality Date  . BRAIN BIOPSY     [facility]  . CERVICAL FUSION    . CHOLECYSTECTOMY    . COLONOSCOPY    . LUMBAR FUSION  07/25/2012  . TONSILLECTOMY      OB History    No data available       Home Medications    Prior to Admission medications   Medication Sig Start Date End Date Taking? Authorizing Provider  acetaminophen (TYLENOL) 325 MG tablet Take 650 mg by mouth every 6 (six) hours as needed.   Yes [provider]  albuterol (PROVENTIL HFA;VENTOLIN HFA) 108 (90 BASE) MCG/ACT inhaler Inhale 2 puffs into the lungs every 4 (four) hours as needed. Reported on 09/24/2015 01/23/15 11/28/16 Yes [provider]  aspirin 81 MG tablet Take 81 mg by mouth daily.     Yes [provider]  atorvastatin (LIPITOR) 40 MG tablet Take 1 tablet (40 mg total) by mouth daily. 08/10/16  Yes Marletta Lor, MD  brimonidine (ALPHAGAN) 0.15 % ophthalmic solution Place 1 drop into the left eye 3 (three) times daily.  09/03/14  Yes [provider]  cetirizine (ZYRTEC) 10 MG tablet Take 10 mg by mouth daily.     Yes [provider]  Cholecalciferol (VITAMIN D3) 2000 UNITS TABS Take 1 tablet by mouth daily.   Yes [provider]  docusate sodium (COLACE) 100 MG capsule Take 100 mg by mouth 2 (two) times daily.  Yes [provider]  dronabinol (MARINOL) 2.5 MG capsule Take 2.5 mg by mouth 2 (two) times daily before a meal.   Yes [provider]  FLUoxetine (PROZAC) 20 MG capsule Take 20 mg by mouth daily.   Yes [provider]  fluticasone (FLONASE) 50 MCG/ACT nasal spray Place 1 spray into both nostrils daily as needed for allergies.  10/16/12  Yes [provider]  haloperidol (HALDOL) 2 MG/ML solution Take 2 mg by mouth every 4 (four) hours as needed for agitation.   Yes [provider]  lisinopril (PRINIVIL,ZESTRIL) 40 MG tablet Take 1 tablet (40 mg total) by mouth  daily. Patient taking differently: Take 10 mg by mouth daily.  08/10/16  Yes Marletta Lor, MD  LORazepam (ATIVAN) 2 MG/ML injection Inject 1 mg into the vein every 6 (six) hours as needed (AGITATION).   Yes [provider]  OLANZapine (ZYPREXA) 2.5 MG tablet Take 2.5 mg by mouth at bedtime.   Yes [provider]  oxybutynin (DITROPAN-XL) 10 MG 24 hr tablet Take 10 mg by mouth at bedtime.   Yes [provider]  oxyCODONE-acetaminophen (PERCOCET/ROXICET) 5-325 MG tablet Take 1 tablet by mouth every 8 (eight) hours as needed for severe pain.   Yes [provider]  pantoprazole (PROTONIX) 40 MG tablet Take 40 mg by mouth daily.   Yes [provider]  polyethylene glycol (MIRALAX / GLYCOLAX) packet Take 17 g by mouth daily.   Yes [provider]  predniSONE (DELTASONE) 2.5 MG tablet Take 1 tablet (2.5 mg total) by mouth daily. 08/10/16  Yes Marletta Lor, MD  sodium chloride (OCEAN) 0.65 % SOLN nasal spray Place 1 spray into both nostrils as needed for congestion.   Yes [provider]  traMADol (ULTRAM) 50 MG tablet Take 50 mg by mouth every 6 (six) hours as needed.   Yes [provider]  hyoscyamine (LEVSIN SL) 0.125 MG SL tablet Place 1 tablet (0.125 mg total) under the tongue every 4 (four) hours as needed. Patient not taking: Reported on 11/28/2016 09/30/15   Marletta Lor, MD  LORazepam (ATIVAN) 1 MG tablet Take 1 tablet (1 mg total) by mouth 2 (two) times daily as needed for anxiety. Patient not taking: Reported on 11/28/2016 10/14/16   Marletta Lor, MD  MYRBETRIQ 50 MG TB24 tablet Take 1 tablet (50 mg total) by mouth daily. Patient not taking: Reported on 10/14/2016 08/10/16   Marletta Lor, MD  omeprazole (PRILOSEC) 20 MG capsule TAKE 1 CAPSULE ONE TIME DAILY Patient not taking: Reported on 11/28/2016 08/10/16   Marletta Lor, MD    Family History Family History  Problem Relation Age of  Onset  . Diabetes Sister   . Hypertension Mother   . Emphysema Father   . Colon cancer Neg Hx   . Esophageal cancer Neg Hx   . Rectal cancer Neg Hx   . Stomach cancer Neg Hx     Social History Social History  Substance Use Topics  . Smoking status: Never Smoker  . Smokeless tobacco: Never Used  . Alcohol use No     Comment: One drink per year     Allergies   Adhesive [tape]; Latex; Cyclophosphamide; and Sulfa antibiotics   Review of Systems Review of Systems  Unable to perform ROS: Mental status change     Physical Exam Updated Vital Signs BP (!) 140/94 (BP Location: Right Arm)   Pulse (!) 117   Temp 99.2  F (37.3 C) (Rectal)   Resp (!) 26   SpO2 98%   Physical Exam  Constitutional: She appears well-developed and well-nourished. No distress.  HENT:  Head: Normocephalic and atraumatic.  dry mucous membranes  Eyes: Conjunctivae are normal. Pupils are equal, round, and reactive to light.  Neck: Neck supple.  Cardiovascular: Normal rate, regular rhythm and normal heart sounds.   No murmur heard. Pulmonary/Chest: Effort normal and breath sounds normal.  Abdominal: Soft. Bowel sounds are normal. She exhibits no distension. There is tenderness (mild suprapubic).  Musculoskeletal: She exhibits no edema.  Healing lumbar surgical scar with 2cm wound dehiscence at caudal end of scar, clean with scant malodorous drainage  Neurological: She is alert.  Fluent speech, oriented to person only  Skin: Skin is warm and dry.  Nursing note and vitals reviewed.    ED Treatments / Results  Labs (all labs ordered are listed, but only abnormal results are displayed) Labs Reviewed  COMPREHENSIVE METABOLIC PANEL - Abnormal; Notable for the following:       Result Value   Chloride 112 (*)    CO2 18 (*)    Glucose, Bld 112 (*)    BUN 51 (*)    Creatinine, Ser 1.95 (*)    Total Protein 6.2 (*)    GFR calc non Af Amer 25 (*)    GFR calc Af Amer 29 (*)    All other components  within normal limits  CBC WITH DIFFERENTIAL/PLATELET - Abnormal; Notable for the following:    Hemoglobin 11.9 (*)    All other components within normal limits  URINALYSIS, ROUTINE W REFLEX MICROSCOPIC - Abnormal; Notable for the following:    APPearance HAZY (*)    Ketones, ur 5 (*)    All other components within normal limits  CBG MONITORING, ED - Abnormal; Notable for the following:    Glucose-Capillary 102 (*)    All other components within normal limits  CULTURE, BLOOD (ROUTINE X 2)  CULTURE, BLOOD (ROUTINE X 2)  URINE CULTURE  I-STAT CG4 LACTIC ACID, ED    EKG  EKG Interpretation None       Radiology Dg Chest 2 View  Result Date: 11/28/2016 CLINICAL DATA:  Confusion EXAM: CHEST  2 VIEW COMPARISON:  10/28/2016, 10/25/2016 FINDINGS: Cervical postsurgical changes. No acute consolidation or effusion. Partially visualized posterior spinal rods and screws in the thoracolumbar spine. Normal cardiomediastinal silhouette. No pneumothorax. IMPRESSION: No acute infiltrate or edema Electronically Signed   By: Donavan Foil M.D.   On: 11/28/2016 21:15   Dg Lumbar Spine Complete  Result Date: 11/28/2016 CLINICAL DATA:  History of back surgery EXAM: LUMBAR SPINE - COMPLETE 4+ VIEW COMPARISON:  10/28/2016 FINDINGS: Surgical clips in the right upper quadrant. SI joints are symmetric. Calcified pelvic phleboliths. Status post posterior stabilization rod and fixating screws from T10 through L5 with interbody device at L3-L4 and L4-L5. Similar appearance of the hardware. The vertebral body heights are grossly maintained. IMPRESSION: Postsurgical changes of the thoracolumbar spine. No definite acute osseous abnormality. Electronically Signed   By: Donavan Foil M.D.   On: 11/28/2016 21:18   Ct Head Wo Contrast  Result Date: 11/28/2016 CLINICAL DATA:  Confusion and hallucinations. EXAM: CT HEAD WITHOUT CONTRAST TECHNIQUE: Contiguous axial images were obtained from the base of the skull through the  vertex without intravenous contrast. COMPARISON:  01/07/2016 FINDINGS: Brain: No evidence of acute infarction, hemorrhage, hydrocephalus, extra-axial collection or mass lesion/mass effect. Again seen is a small area of encephalomalacia  in the right temporal lobe. Moderate brain parenchymal volume loss. Vascular: No hyperdense vessel or unexpected calcification. Skull: Probable postsurgical changes from prior right temporal craniotomy, stable. Sinuses/Orbits: No acute finding. Other: None. IMPRESSION: No acute intracranial abnormality. Mild brain parenchymal volume loss. Stable changes from probable right temporal craniotomy with area of cortical encephalomalacia. Electronically Signed   By: Fidela Salisbury M.D.   On: 11/28/2016 22:00    Procedures Procedures (including critical care time)  Medications Ordered in ED Medications  sodium chloride 0.9 % bolus 1,000 mL (1,000 mLs Intravenous New Bag/Given 11/28/16 2123)     Initial Impression / Assessment and Plan / ED Course  I have reviewed the triage vital signs and the nursing notes.  Pertinent labs & imaging results that were available during my care of the patient were reviewed by me and considered in my medical decision making (see chart for details).    Pt sent from rehab facility for Confusion and hallucinations since yesterday. On arrival, she had reassuring vital signs. She was oriented to person, able to answer basic questions. She did tolerate that she had recently completed antibiotics for urinary tract infection. Her lab work is notable for dehydration with bicarbonate 18, BUN 51, creatinine 1.95, normal WBC count and normal lactate. UA unremarkable.  CT head, chest x-ray, and plain film of lumbar spine are negative for acute process to explain her symptoms. On reexamination, the patient is obviously delirious, telling me she is [place] and just got finished riding a horse. I am concerned about her AMS of unclear etiology. Discussed  with hospitalist, Dr. Hal Hope. We discussed possibility of encephalitis/meniningitis, however pt has healing incision over site of any potential LP and she is afebrile with normal WBC count. We agreed to obtain CT lumbar spine to evaluate for fluid collection or evidence of infection. Pt will be admitted for further work up.  Final Clinical Impressions(s) / ED Diagnoses   Final diagnoses:  Delirium  AKI (acute kidney injury) [facility])    New Prescriptions New Prescriptions   No medications on file     Little, Wenda Overland, MD 11/28/16 2341

## 2016-11-28 NOTE — ED Triage Notes (Signed)
Patient comes from [facility] rehab. Patient has been confused and hallucinating since yesterday (11/27/2016). Patient had back surgery about a week ago.

## 2016-11-29 ENCOUNTER — Encounter (HOSPITAL_COMMUNITY): Payer: Self-pay | Admitting: Internal Medicine

## 2016-11-29 DIAGNOSIS — R293 Abnormal posture: Secondary | ICD-10-CM | POA: Diagnosis not present

## 2016-11-29 DIAGNOSIS — R278 Other lack of coordination: Secondary | ICD-10-CM | POA: Diagnosis not present

## 2016-11-29 DIAGNOSIS — M797 Fibromyalgia: Secondary | ICD-10-CM | POA: Diagnosis present

## 2016-11-29 DIAGNOSIS — E870 Hyperosmolality and hypernatremia: Secondary | ICD-10-CM | POA: Diagnosis present

## 2016-11-29 DIAGNOSIS — M4805 Spinal stenosis, thoracolumbar region: Secondary | ICD-10-CM | POA: Diagnosis not present

## 2016-11-29 DIAGNOSIS — C9110 Chronic lymphocytic leukemia of B-cell type not having achieved remission: Secondary | ICD-10-CM | POA: Diagnosis present

## 2016-11-29 DIAGNOSIS — Z5189 Encounter for other specified aftercare: Secondary | ICD-10-CM | POA: Diagnosis not present

## 2016-11-29 DIAGNOSIS — E6609 Other obesity due to excess calories: Secondary | ICD-10-CM | POA: Diagnosis present

## 2016-11-29 DIAGNOSIS — R2681 Unsteadiness on feet: Secondary | ICD-10-CM | POA: Diagnosis not present

## 2016-11-29 DIAGNOSIS — G4730 Sleep apnea, unspecified: Secondary | ICD-10-CM | POA: Diagnosis present

## 2016-11-29 DIAGNOSIS — J810 Acute pulmonary edema: Secondary | ICD-10-CM | POA: Diagnosis not present

## 2016-11-29 DIAGNOSIS — I10 Essential (primary) hypertension: Secondary | ICD-10-CM

## 2016-11-29 DIAGNOSIS — E872 Acidosis: Secondary | ICD-10-CM | POA: Diagnosis present

## 2016-11-29 DIAGNOSIS — E162 Hypoglycemia, unspecified: Secondary | ICD-10-CM | POA: Diagnosis not present

## 2016-11-29 DIAGNOSIS — G9340 Encephalopathy, unspecified: Secondary | ICD-10-CM | POA: Diagnosis present

## 2016-11-29 DIAGNOSIS — E876 Hypokalemia: Secondary | ICD-10-CM | POA: Diagnosis present

## 2016-11-29 DIAGNOSIS — R630 Anorexia: Secondary | ICD-10-CM | POA: Diagnosis present

## 2016-11-29 DIAGNOSIS — Z981 Arthrodesis status: Secondary | ICD-10-CM | POA: Diagnosis not present

## 2016-11-29 DIAGNOSIS — C9190 Lymphoid leukemia, unspecified not having achieved remission: Secondary | ICD-10-CM | POA: Diagnosis not present

## 2016-11-29 DIAGNOSIS — R41841 Cognitive communication deficit: Secondary | ICD-10-CM | POA: Diagnosis not present

## 2016-11-29 DIAGNOSIS — R410 Disorientation, unspecified: Secondary | ICD-10-CM

## 2016-11-29 DIAGNOSIS — E785 Hyperlipidemia, unspecified: Secondary | ICD-10-CM | POA: Diagnosis present

## 2016-11-29 DIAGNOSIS — M6281 Muscle weakness (generalized): Secondary | ICD-10-CM | POA: Diagnosis not present

## 2016-11-29 DIAGNOSIS — Z6833 Body mass index (BMI) 33.0-33.9, adult: Secondary | ICD-10-CM | POA: Diagnosis not present

## 2016-11-29 DIAGNOSIS — R4182 Altered mental status, unspecified: Secondary | ICD-10-CM | POA: Diagnosis not present

## 2016-11-29 DIAGNOSIS — E860 Dehydration: Secondary | ICD-10-CM | POA: Diagnosis present

## 2016-11-29 DIAGNOSIS — R000 Tachycardia, unspecified: Secondary | ICD-10-CM | POA: Diagnosis not present

## 2016-11-29 DIAGNOSIS — R471 Dysarthria and anarthria: Secondary | ICD-10-CM | POA: Diagnosis present

## 2016-11-29 DIAGNOSIS — H9192 Unspecified hearing loss, left ear: Secondary | ICD-10-CM | POA: Diagnosis present

## 2016-11-29 DIAGNOSIS — M546 Pain in thoracic spine: Secondary | ICD-10-CM | POA: Diagnosis present

## 2016-11-29 DIAGNOSIS — M4325 Fusion of spine, thoracolumbar region: Secondary | ICD-10-CM | POA: Diagnosis not present

## 2016-11-29 DIAGNOSIS — H5461 Unqualified visual loss, right eye, normal vision left eye: Secondary | ICD-10-CM | POA: Diagnosis present

## 2016-11-29 DIAGNOSIS — K589 Irritable bowel syndrome without diarrhea: Secondary | ICD-10-CM | POA: Diagnosis present

## 2016-11-29 DIAGNOSIS — N179 Acute kidney failure, unspecified: Secondary | ICD-10-CM | POA: Diagnosis not present

## 2016-11-29 DIAGNOSIS — R443 Hallucinations, unspecified: Secondary | ICD-10-CM | POA: Diagnosis present

## 2016-11-29 DIAGNOSIS — R7881 Bacteremia: Secondary | ICD-10-CM | POA: Diagnosis not present

## 2016-11-29 DIAGNOSIS — D8689 Sarcoidosis of other sites: Secondary | ICD-10-CM | POA: Diagnosis present

## 2016-11-29 LAB — BLOOD CULTURE ID PANEL (REFLEXED)

## 2016-11-29 LAB — HEPATIC FUNCTION PANEL
ALT: 20 U/L (ref 14–54)
AST: 32 U/L (ref 15–41)
Albumin: 3.4 g/dL — ABNORMAL LOW (ref 3.5–5.0)
Alkaline Phosphatase: 110 U/L (ref 38–126)
Bilirubin, Direct: <0.1 mg/dL — ABNORMAL LOW (ref 0.1–0.5)
Total Bilirubin: 0.6 mg/dL (ref 0.3–1.2)
Total Protein: 6 g/dL — ABNORMAL LOW (ref 6.5–8.1)

## 2016-11-29 LAB — RAPID URINE DRUG SCREEN, HOSP PERFORMED
Amphetamines: NOT DETECTED
Barbiturates: NOT DETECTED
Benzodiazepines: NOT DETECTED
Cocaine: NOT DETECTED
Opiates: NOT DETECTED
Tetrahydrocannabinol: POSITIVE — AB

## 2016-11-29 LAB — GLUCOSE, CAPILLARY
Glucose-Capillary: 117 mg/dL — ABNORMAL HIGH (ref 65–99)
Glucose-Capillary: 118 mg/dL — ABNORMAL HIGH (ref 65–99)
Glucose-Capillary: 132 mg/dL — ABNORMAL HIGH (ref 65–99)
Glucose-Capillary: 137 mg/dL — ABNORMAL HIGH (ref 65–99)
Glucose-Capillary: 151 mg/dL — ABNORMAL HIGH (ref 65–99)
Glucose-Capillary: 93 mg/dL (ref 65–99)

## 2016-11-29 LAB — BASIC METABOLIC PANEL
Anion gap: 11 (ref 5–15)
BUN: 44 mg/dL — ABNORMAL HIGH (ref 6–20)
CO2: 20 mmol/L — ABNORMAL LOW (ref 22–32)
Calcium: 9.1 mg/dL (ref 8.9–10.3)
Chloride: 112 mmol/L — ABNORMAL HIGH (ref 101–111)
Creatinine, Ser: 1.66 mg/dL — ABNORMAL HIGH (ref 0.44–1.00)
GFR calc Af Amer: 36 mL/min — ABNORMAL LOW (ref >60)
GFR calc non Af Amer: 31 mL/min — ABNORMAL LOW (ref >60)
Glucose, Bld: 135 mg/dL — ABNORMAL HIGH (ref 65–99)
Potassium: 4.3 mmol/L (ref 3.5–5.1)
Sodium: 143 mmol/L (ref 135–145)

## 2016-11-29 LAB — CBC WITH DIFFERENTIAL/PLATELET
Basophils Absolute: 0 10*3/uL (ref 0.0–0.1)
Basophils Relative: 0 %
Eosinophils Absolute: 0 10*3/uL (ref 0.0–0.7)
Eosinophils Relative: 0 %
HCT: 33.1 % — ABNORMAL LOW (ref 36.0–46.0)
Hemoglobin: 10.4 g/dL — ABNORMAL LOW (ref 12.0–15.0)
Lymphocytes Relative: 12 %
Lymphs Abs: 1.1 10*3/uL (ref 0.7–4.0)
MCH: 26.5 pg (ref 26.0–34.0)
MCHC: 31.4 g/dL (ref 30.0–36.0)
MCV: 84.4 fL (ref 78.0–100.0)
Monocytes Absolute: 0.6 10*3/uL (ref 0.1–1.0)
Monocytes Relative: 6 %
Neutro Abs: 7.4 10*3/uL (ref 1.7–7.7)
Neutrophils Relative %: 82 %
Platelets: 203 10*3/uL (ref 150–400)
RBC: 3.92 MIL/uL (ref 3.87–5.11)
RDW: 15.6 % — ABNORMAL HIGH (ref 11.5–15.5)
WBC: 9 10*3/uL (ref 4.0–10.5)

## 2016-11-29 LAB — LACTIC ACID, PLASMA
Lactic Acid, Venous: 1.3 mmol/L (ref 0.5–1.9)
Lactic Acid, Venous: 1.2 mmol/L (ref 0.5–1.9)

## 2016-11-29 LAB — RPR: RPR Ser Ql: NONREACTIVE

## 2016-11-29 LAB — HIV ANTIBODY (ROUTINE TESTING W REFLEX): HIV Screen 4th Generation wRfx: NONREACTIVE

## 2016-11-29 LAB — TSH: TSH: 1.318 u[IU]/mL (ref 0.350–4.500)

## 2016-11-29 LAB — PROCALCITONIN: Procalcitonin: 0.53 ng/mL

## 2016-11-29 LAB — MAGNESIUM: Magnesium: 1.5 mg/dL — ABNORMAL LOW (ref 1.7–2.4)

## 2016-11-29 LAB — SEDIMENTATION RATE: Sed Rate: 30 mm/hr — ABNORMAL HIGH (ref 0–22)

## 2016-11-29 LAB — MRSA PCR SCREENING: MRSA by PCR: NEGATIVE

## 2016-11-29 LAB — VITAMIN B12: Vitamin B-12: 281 pg/mL (ref 180–914)

## 2016-11-29 LAB — AMMONIA: Ammonia: 12 umol/L (ref 9–35)

## 2016-11-29 MED ORDER — DEXTROSE-NACL 5-0.9 % IV SOLN
INTRAVENOUS | Status: AC
  Administered 2016-11-29: 04:00:00 via INTRAVENOUS

## 2016-11-29 MED ORDER — MAGNESIUM SULFATE 2 GM/50ML IV SOLN
2.0000 g | Freq: Once | INTRAVENOUS | Status: AC
  Administered 2016-11-29: 2 g via INTRAVENOUS
  Filled 2016-11-29: qty 50

## 2016-11-29 MED ORDER — PREDNISONE 5 MG PO TABS
2.5000 mg | ORAL_TABLET | Freq: Every day | ORAL | Status: DC
  Administered 2016-11-29: 2.5 mg via ORAL
  Filled 2016-11-29: qty 1

## 2016-11-29 MED ORDER — DEXTROSE 5 % IV SOLN
700.0000 mg | Freq: Two times a day (BID) | INTRAVENOUS | Status: DC
  Administered 2016-11-29 – 2016-12-01 (×4): 700 mg via INTRAVENOUS
  Filled 2016-11-29 (×6): qty 14

## 2016-11-29 MED ORDER — VANCOMYCIN HCL IN DEXTROSE 1-5 GM/200ML-% IV SOLN
1000.0000 mg | Freq: Once | INTRAVENOUS | Status: AC
  Administered 2016-11-29: 1000 mg via INTRAVENOUS
  Filled 2016-11-29: qty 200

## 2016-11-29 MED ORDER — CHLORHEXIDINE GLUCONATE 0.12 % MT SOLN
15.0000 mL | Freq: Two times a day (BID) | OROMUCOSAL | Status: DC
  Administered 2016-11-29 – 2016-12-05 (×11): 15 mL via OROMUCOSAL
  Filled 2016-11-29 (×11): qty 15

## 2016-11-29 MED ORDER — DRONABINOL 2.5 MG PO CAPS: 2.5000 mg | ORAL_CAPSULE | Freq: Two times a day (BID) | ORAL | Status: DC

## 2016-11-29 MED ORDER — VANCOMYCIN HCL IN DEXTROSE 1-5 GM/200ML-% IV SOLN
1000.0000 mg | INTRAVENOUS | Status: DC
  Administered 2016-11-30: 1000 mg via INTRAVENOUS
  Filled 2016-11-29: qty 200

## 2016-11-29 MED ORDER — HYDRALAZINE HCL 20 MG/ML IJ SOLN
10.0000 mg | INTRAMUSCULAR | Status: DC | PRN
  Administered 2016-12-04: 10 mg via INTRAVENOUS
  Filled 2016-11-29: qty 1

## 2016-11-29 MED ORDER — FLUOXETINE HCL 20 MG PO CAPS
20.0000 mg | ORAL_CAPSULE | Freq: Every day | ORAL | Status: DC
  Administered 2016-11-30 – 2016-12-05 (×5): 20 mg via ORAL
  Filled 2016-11-29 (×6): qty 1

## 2016-11-29 MED ORDER — ONDANSETRON HCL 4 MG PO TABS: 4.0000 mg | ORAL_TABLET | Freq: Four times a day (QID) | ORAL | Status: DC | PRN

## 2016-11-29 MED ORDER — OLANZAPINE 2.5 MG PO TABS
2.5000 mg | ORAL_TABLET | Freq: Every day | ORAL | Status: DC
  Administered 2016-12-01 – 2016-12-04 (×5): 2.5 mg via ORAL
  Filled 2016-11-29 (×9): qty 1

## 2016-11-29 MED ORDER — ACETAMINOPHEN 325 MG PO TABS: 650.0000 mg | ORAL_TABLET | Freq: Four times a day (QID) | ORAL | Status: DC | PRN

## 2016-11-29 MED ORDER — ORAL CARE MOUTH RINSE
15.0000 mL | Freq: Two times a day (BID) | OROMUCOSAL | Status: DC
  Administered 2016-11-29 – 2016-12-05 (×6): 15 mL via OROMUCOSAL

## 2016-11-29 MED ORDER — SODIUM CHLORIDE 0.9 % IV SOLN
2.0000 g | Freq: Four times a day (QID) | INTRAVENOUS | Status: DC
  Administered 2016-11-29 – 2016-11-30 (×6): 2 g via INTRAVENOUS
  Filled 2016-11-29 (×10): qty 2000

## 2016-11-29 MED ORDER — HALOPERIDOL LACTATE 2 MG/ML PO CONC
2.0000 mg | ORAL | Status: DC | PRN
  Administered 2016-11-29: 2 mg via ORAL
  Filled 2016-11-29 (×2): qty 1

## 2016-11-29 MED ORDER — DEXTROSE 5 % IV SOLN
2.0000 g | Freq: Two times a day (BID) | INTRAVENOUS | Status: DC
  Administered 2016-11-29 – 2016-11-30 (×4): 2 g via INTRAVENOUS
  Filled 2016-11-29 (×6): qty 2

## 2016-11-29 MED ORDER — LORAZEPAM 2 MG/ML IJ SOLN: 1.0000 mg | Freq: Four times a day (QID) | INTRAMUSCULAR | Status: DC | PRN

## 2016-11-29 MED ORDER — CYANOCOBALAMIN 1000 MCG/ML IJ SOLN
1000.0000 ug | Freq: Every day | INTRAMUSCULAR | Status: DC
  Administered 2016-11-29: 1000 ug via SUBCUTANEOUS
  Filled 2016-11-29 (×2): qty 1

## 2016-11-29 MED ORDER — ACETAMINOPHEN 650 MG RE SUPP: 650.0000 mg | Freq: Four times a day (QID) | RECTAL | Status: DC | PRN

## 2016-11-29 MED ORDER — ONDANSETRON HCL 4 MG/2ML IJ SOLN: 4.0000 mg | Freq: Four times a day (QID) | INTRAMUSCULAR | Status: DC | PRN

## 2016-11-29 MED ORDER — LORAZEPAM 2 MG/ML IJ SOLN
0.5000 mg | INTRAMUSCULAR | Status: DC | PRN
Start: 2016-11-29 — End: 2016-11-29

## 2016-11-29 NOTE — Progress Notes (Signed)
Pharmacy: Renal dose adjustment for medications  Patient's a 71 y.o F presented to the ED on 11/28/16 with AMS.  She's currently on abx for r/o meningitis.  Patient's scr on admission was 1.95 but decreased to 1.65 this morning (crcl~28-35) with D5NS at 100 ml/hr.  Scr value about a yr ago was 0.95. Pharmacy is consulted to adjust medications for renal funct.  Plan: - continue acyclovir, ampicilin, and ceftriaxone at current doses - change vancomycin to 1gm IV q24h - no renal adjustment need for the other oral medications - monitor renal function  Dia Sitter, PharmD, BCPS 11/29/2016 8:39 AM

## 2016-11-29 NOTE — Care Management Note (Signed)
Case Management Note  Patient Details  Name: Sheryl [name] MRN: [id number] Date of Birth: 08-19-1945  Subjective/Objective:                   Altered Mental Status     Action/Plan: [id number]/YOMAYO Graciella Arment,BSN,RN3,[phone] WILL FOLLOW FOR DC NEEDS AND PROGRESSION.  Expected Discharge Date:                  Expected Discharge Plan:  Home/Self Care  In-House Referral:     Discharge planning Services  CM Consult  Post Acute Care Choice:    Choice offered to:     DME Arranged:    DME Agency:     HH Arranged:    HH Agency:     Status of Service:  In process, will continue to follow  If discussed at Long Length of Stay Meetings, dates discussed:    Additional Comments:  Leeroy Cha, RN 11/29/2016, 9:08 AM

## 2016-11-29 NOTE — Progress Notes (Signed)
CSW consulted to assist with d/c planning. Pt admitted from [facility] ( [facility] Pt is unable to assist with d/c planning at this time due to her cognitive status. CSW has left a VM for spouse requesting that he contact CSW. Awaiting return call. Full psychosocial assessment to follow.  Werner Lean LCSW [phone]

## 2016-11-29 NOTE — H&P (Signed)
History and Physical    Sheryl [name] OIN:[id number] DOB: Sep 28, 1945 DOA: 11/28/2016  PCP: Marletta Lor, MD  Patient coming from: [place].  Chief Complaint: Confusion.  Most of the history is obtained from previous chart and ER physician. No family available at this time.  HPI: Sheryl [name] is a 71 y.o. female with history of neurosarcoidosis presently off medications, history of CLL, fibromyalgia, hypertension who has had recent low back surgery at [facility] last month was brought in from skilled nursing facility after patient was found to be increasingly confused for the last 24 hours. There was no mention of any nausea vomiting chest pain shortness of breath diarrhea fever or chills.   ED Course: In the ER patient has periodic episodes of confusion with agitation. It is not known if patient has had been on any new medications or any medications have been taken off. Patient's family is not available to give more further history. On exam patient is on seeing questions appropriately for certain time and sometimes gets confused. Follows commands and moves all extremities. Patient states she was treated for UTI last week. The only complaint patient has his mid back pain around the shoulders. On exam patient has some neck rigidity. The scar over the recent surgery looks little erythematous but no active discharge. Lumbar CT shows no definite infection.  Review of Systems: As per HPI, rest all negative.   Past Medical History:  Diagnosis Date  . Allergy   . Cataract   . 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 DEGENERATIVE JOINT DISEASE 12/14/2006  . EXOGENOUS OBESITY 12/14/2006  . FIBROMYALGIA 12/14/2006  . Gallstones   . Hearing loss of left ear   . HYPERLIPIDEMIA 12/14/2006  . HYPERTENSION 12/14/2006  . IBS (irritable bowel syndrome)   . Neurosarcoidosis   . Sinusitis nasal 06/24/2016  . Sleep apnea    no CPAP  . Vision loss of right eye     Past Surgical History:    Procedure Laterality Date  . BRAIN BIOPSY     [facility]  . CERVICAL FUSION    . CHOLECYSTECTOMY    . COLONOSCOPY    . LUMBAR FUSION  07/25/2012  . TONSILLECTOMY       reports that she has never smoked. She has never used smokeless tobacco. She reports that she does not drink alcohol or use drugs.  Allergies  Allergen Reactions  . Adhesive [Tape] Rash    Redness from tape.   . Latex Anaphylaxis, Rash and Other (See Comments)    Added based on information entered during case entry, please review and add reactions, type, and severity as needed. Patient cannot confirm this allergy.  . Cyclophosphamide Other (See Comments)    Caused diverticulitis.  She would not eat or drink for days, while taking it.  . Sulfa Antibiotics     PER MAR    Family History  Problem Relation Age of Onset  . Diabetes Sister   . Hypertension Mother   . Emphysema Father   . Colon cancer Neg Hx   . Esophageal cancer Neg Hx   . Rectal cancer Neg Hx   . Stomach cancer Neg Hx     Prior to Admission medications   Medication Sig Start Date End Date Taking? Authorizing Provider  acetaminophen (TYLENOL) 325 MG tablet Take 650 mg by mouth every 6 (six) hours as needed.   Yes [provider]  albuterol (PROVENTIL HFA;VENTOLIN HFA) 108 (90 BASE) MCG/ACT inhaler Inhale 2  puffs into the lungs every 4 (four) hours as needed. Reported on 09/24/2015 01/23/15 11/28/16 Yes [provider]  aspirin 81 MG tablet Take 81 mg by mouth daily.     Yes [provider]  atorvastatin (LIPITOR) 40 MG tablet Take 1 tablet (40 mg total) by mouth daily. 08/10/16  Yes Marletta Lor, MD  brimonidine (ALPHAGAN) 0.15 % ophthalmic solution Place 1 drop into the left eye 3 (three) times daily.  09/03/14  Yes [provider]  cetirizine (ZYRTEC) 10 MG tablet Take 10 mg by mouth daily.     Yes [provider]  Cholecalciferol (VITAMIN D3) 2000 UNITS TABS Take 1 tablet by mouth daily.   Yes  [provider]  docusate sodium (COLACE) 100 MG capsule Take 100 mg by mouth 2 (two) times daily.   Yes [provider]  dronabinol (MARINOL) 2.5 MG capsule Take 2.5 mg by mouth 2 (two) times daily before a meal.   Yes [provider]  FLUoxetine (PROZAC) 20 MG capsule Take 20 mg by mouth daily.   Yes [provider]  fluticasone (FLONASE) 50 MCG/ACT nasal spray Place 1 spray into both nostrils daily as needed for allergies.  10/16/12  Yes [provider]  haloperidol (HALDOL) 2 MG/ML solution Take 2 mg by mouth every 4 (four) hours as needed for agitation.   Yes [provider]  lisinopril (PRINIVIL,ZESTRIL) 40 MG tablet Take 1 tablet (40 mg total) by mouth daily. Patient taking differently: Take 10 mg by mouth daily.  08/10/16  Yes Marletta Lor, MD  LORazepam (ATIVAN) 2 MG/ML injection Inject 1 mg into the vein every 6 (six) hours as needed (AGITATION).   Yes [provider]  OLANZapine (ZYPREXA) 2.5 MG tablet Take 2.5 mg by mouth at bedtime.   Yes [provider]  oxybutynin (DITROPAN-XL) 10 MG 24 hr tablet Take 10 mg by mouth at bedtime.   Yes [provider]  oxyCODONE-acetaminophen (PERCOCET/ROXICET) 5-325 MG tablet Take 1 tablet by mouth every 8 (eight) hours as needed for severe pain.   Yes [provider]  pantoprazole (PROTONIX) 40 MG tablet Take 40 mg by mouth daily.   Yes [provider]  polyethylene glycol (MIRALAX / GLYCOLAX) packet Take 17 g by mouth daily.   Yes [provider]  predniSONE (DELTASONE) 2.5 MG tablet Take 1 tablet (2.5 mg total) by mouth daily. 08/10/16  Yes Marletta Lor, MD  sodium chloride (OCEAN) 0.65 % SOLN nasal spray Place 1 spray into both nostrils as needed for congestion.   Yes [provider]  traMADol (ULTRAM) 50 MG tablet Take 50 mg by mouth every 6 (six) hours as needed.   Yes [provider]  hyoscyamine (LEVSIN SL)  0.125 MG SL tablet Place 1 tablet (0.125 mg total) under the tongue every 4 (four) hours as needed. Patient not taking: Reported on 11/28/2016 09/30/15   Marletta Lor, MD  LORazepam (ATIVAN) 1 MG tablet Take 1 tablet (1 mg total) by mouth 2 (two) times daily as needed for anxiety. Patient not taking: Reported on 11/28/2016 10/14/16   Marletta Lor, MD  MYRBETRIQ 50 MG TB24 tablet Take 1 tablet (50 mg total) by mouth daily. Patient not taking: Reported on 10/14/2016 08/10/16   Marletta Lor, MD  omeprazole (PRILOSEC) 20 MG capsule TAKE 1 CAPSULE ONE TIME DAILY Patient not taking: Reported on 11/28/2016 08/10/16   Marletta Lor, MD    Physical Exam: Vitals:  11/28/16 2340 11/29/16 0126 11/29/16 0209 11/29/16 0308  BP: 136/61 (!) 114/91 (!) 152/62 (!) 127/48  Pulse: (!) 103 (!) 106 (!) 109 97  Resp: 19 18 (!) 23 18  Temp: 99.5 F (37.5 C)   98.8 F (37.1 C)  TempSrc: Rectal   Oral  SpO2: 98% 96% 96% 98%  Weight: 81.6 kg (180 lb)   69.7 kg (153 lb 10.6 oz)  Height: 5\' 4"  (1.626 m)   5' (1.524 m)      Constitutional: Moderately built and nourished. Vitals:   11/28/16 2340 11/29/16 0126 11/29/16 0209 11/29/16 0308  BP: 136/61 (!) 114/91 (!) 152/62 (!) 127/48  Pulse: (!) 103 (!) 106 (!) 109 97  Resp: 19 18 (!) 23 18  Temp: 99.5 F (37.5 C)   98.8 F (37.1 C)  TempSrc: Rectal   Oral  SpO2: 98% 96% 96% 98%  Weight: 81.6 kg (180 lb)   69.7 kg (153 lb 10.6 oz)  Height: 5\' 4"  (1.626 m)   5' (1.524 m)   Eyes: Anicteric no pallor. ENMT: No discharge from the ears eyes nose and mouth. Neck: Mild neck rigidity. Respiratory: No rhonchi or crepitations. Cardiovascular: S1 and S2 heard. Abdomen: Soft nontender bowel sounds present. Musculoskeletal: No edema. No joint effusion. Skin: Rash over the recent surgical site in the mid back. Neurologic: Alert awake oriented to her name at times gets confused follows commands moves all extremities. Psychiatric: Mildly  confused.   Labs on Admission: I have personally reviewed following labs and imaging studies  CBC:  Recent Labs Lab 11/28/16 2021  WBC 7.9  NEUTROABS 5.8  HGB 11.9*  HCT 36.4  MCV 83.9  PLT 242   Basic Metabolic Panel:  Recent Labs Lab 11/28/16 2021  NA 143  K 4.7  CL 112*  CO2 18*  GLUCOSE 112*  BUN 51*  CREATININE 1.95*  CALCIUM 9.5   GFR: Estimated Creatinine Clearance: 24.1 mL/min (A) (by C-G formula based on SCr of 1.95 mg/dL (H)). Liver Function Tests:  Recent Labs Lab 11/28/16 2021  AST 34  ALT 20  ALKPHOS 122  BILITOT 0.6  PROT 6.2*  ALBUMIN 3.8   No results for input(s): LIPASE, AMYLASE in the last 168 hours. No results for input(s): AMMONIA in the last 168 hours. Coagulation Profile: No results for input(s): INR, PROTIME in the last 168 hours. Cardiac Enzymes: No results for input(s): CKTOTAL, CKMB, CKMBINDEX, TROPONINI in the last 168 hours. BNP (last 3 results) No results for input(s): PROBNP in the last 8760 hours. HbA1C: No results for input(s): HGBA1C in the last 72 hours. CBG:  Recent Labs Lab 11/28/16 2012  GLUCAP 102*   Lipid Profile: No results for input(s): CHOL, HDL, LDLCALC, TRIG, CHOLHDL, LDLDIRECT in the last 72 hours. Thyroid Function Tests: No results for input(s): TSH, T4TOTAL, FREET4, T3FREE, THYROIDAB in the last 72 hours. Anemia Panel: No results for input(s): VITAMINB12, FOLATE, FERRITIN, TIBC, IRON, RETICCTPCT in the last 72 hours. Urine analysis:    Component Value Date/Time   COLORURINE YELLOW 11/28/2016 2004   APPEARANCEUR HAZY (A) 11/28/2016 2004   LABSPEC 1.016 11/28/2016 2004   PHURINE 5.0 11/28/2016 2004   GLUCOSEU NEGATIVE 11/28/2016 2004   HGBUR NEGATIVE 11/28/2016 2004   BILIRUBINUR NEGATIVE 11/28/2016 2004   BILIRUBINUR n 06/17/2013 1213   KETONESUR 5 (A) 11/28/2016 2004   PROTEINUR NEGATIVE 11/28/2016 2004   UROBILINOGEN 0.2 06/17/2013 1213   NITRITE NEGATIVE 11/28/2016 2004   LEUKOCYTESUR  NEGATIVE 11/28/2016 2004  Sepsis Labs: @LABRCNTIP (procalcitonin:4,lacticidven:4) )No results found for this or any previous visit (from the past 240 hour(s)).   Radiological Exams on Admission: Dg Chest 2 View  Result Date: 11/28/2016 CLINICAL DATA:  Confusion EXAM: CHEST  2 VIEW COMPARISON:  10/28/2016, 10/25/2016 FINDINGS: Cervical postsurgical changes. No acute consolidation or effusion. Partially visualized posterior spinal rods and screws in the thoracolumbar spine. Normal cardiomediastinal silhouette. No pneumothorax. IMPRESSION: No acute infiltrate or edema Electronically Signed   By: Donavan Foil M.D.   On: 11/28/2016 21:15   Dg Lumbar Spine Complete  Result Date: 11/28/2016 CLINICAL DATA:  History of back surgery EXAM: LUMBAR SPINE - COMPLETE 4+ VIEW COMPARISON:  10/28/2016 FINDINGS: Surgical clips in the right upper quadrant. SI joints are symmetric. Calcified pelvic phleboliths. Status post posterior stabilization rod and fixating screws from T10 through L5 with interbody device at L3-L4 and L4-L5. Similar appearance of the hardware. The vertebral body heights are grossly maintained. IMPRESSION: Postsurgical changes of the thoracolumbar spine. No definite acute osseous abnormality. Electronically Signed   By: Donavan Foil M.D.   On: 11/28/2016 21:18   Ct Head Wo Contrast  Result Date: 11/28/2016 CLINICAL DATA:  Confusion and hallucinations. EXAM: CT HEAD WITHOUT CONTRAST TECHNIQUE: Contiguous axial images were obtained from the base of the skull through the vertex without intravenous contrast. COMPARISON:  01/07/2016 FINDINGS: Brain: No evidence of acute infarction, hemorrhage, hydrocephalus, extra-axial collection or mass lesion/mass effect. Again seen is a small area of encephalomalacia in the right temporal lobe. Moderate brain parenchymal volume loss. Vascular: No hyperdense vessel or unexpected calcification. Skull: Probable postsurgical changes from prior right temporal craniotomy,  stable. Sinuses/Orbits: No acute finding. Other: None. IMPRESSION: No acute intracranial abnormality. Mild brain parenchymal volume loss. Stable changes from probable right temporal craniotomy with area of cortical encephalomalacia. Electronically Signed   By: Fidela Salisbury M.D.   On: 11/28/2016 22:00   Ct Lumbar Spine Wo Contrast  Result Date: 11/29/2016 CLINICAL DATA:  Initial evaluation for altered mental status, recent lumbar surgery. EXAM: CT LUMBAR SPINE WITHOUT CONTRAST TECHNIQUE: Multidetector CT imaging of the lumbar spine was performed without intravenous contrast administration. Multiplanar CT image reconstructions were also generated. COMPARISON:  Prior radiograph from 11/28/2016. FINDINGS: Segmentation: Normal segmentation. Lowest well-formed disc is labeled the L5-S1 level. Alignment: Chronic trace anterolisthesis of L4 on L5, stable. Previously seen retrolisthesis of L3 on L4 no longer clearly seen. Trace retrolisthesis of L1 on L2. Vertebrae: Patient is status post extensive thoracolumbar fusion at T10 through L5. Bilateral transpedicular screws with interlocking rods in place at these levels. Interbody implants in place at L3-4 and L4-5. Solid osseous fusion at the L4-5 level. Patient is status post wide posterior decompression at L3-4. No evidence for hardware complication. Vertebral body heights maintained. No acute osseous abnormality identified. No discrete or worrisome osseous lesion. Visualized sacrum intact. SI joints symmetric and within normal limits. Paraspinal and other soft tissues: Postoperative changes seen within the paraspinous soft tissues of the lower back. No acute soft tissue abnormality identified. Curvilinear soft tissue calcifications within the posterior paraspinous musculature extending from T11 through L2-3 likely postoperative in nature. No significant soft tissue swelling or inflammatory stranding to suggest acute infection or inflammation on this noncontrast  examination. Moderate aortic atherosclerosis noted. Visualized visceral structures otherwise unremarkable. Partially visualized lung bases are clear. Disc levels: Degenerative disc bulge with moderate facet arthropathy noted at L5-S1, with resultant mild to moderate bilateral L5 foraminal narrowing. No other significant stenosis identified within the visualized thoracolumbar spine.  Please note evaluation limited by extensive streak artifact from fusion hardware. IMPRESSION: 1. Postoperative changes from prior removed thoracolumbar fusion at T10 through L5 without complication. 2. Postoperative changes within the paraspinous soft tissues as above. No significant swelling or inflammatory changes identified to suggest infection or other acute abnormality on this noncontrast examination. 3. Moderate aortic atherosclerosis. Electronically Signed   By: Jeannine Boga M.D.   On: 11/29/2016 01:26     Assessment/Plan Principal Problem:   Acute encephalopathy Active Problems:   Chronic lymphocytic leukemia (HCC)   Essential hypertension   Neurosarcoidosis    1. Acute encephalopathy - cause not clear. Since patient has mild neck rigidity I placed patient on empiric antibiotics for meningitis/encephalitis. Difficult to do lumbar puncture due to recent surgery in the lumbar area. Follow blood cultures. Since patient has had neurosarcoidosis I have discussed with on-call neurologist Dr. Shon Hale will be seeing patient in consult. Dr. Shon Hale has requested MRI brain which has been ordered. This patient also complains of mid back pain I have ordered MRI of the T-spine and L-spine. When family is available me to get further history about medications. Check ammonia levels RPR TSH EEG follow cultures. 2. Hypertension - since patient is nothing by mouth I have placed patient on when necessary IV hydralazine. 3. History of CLL - being observed. 4. History of fibromyalgia. 5. Patient is on antipsychotics - need to get  further history from family. 6. Recent low back surgery - surgical site appears mildly erythematous but no active discharge. Presently on antibiotics. Follow MRI results.   DVT prophylaxis: SCDs. Code Status: Full code.  Family Communication: No family at the bedside.  Disposition Plan: To be determined.  Consults called: Neurology.  Admission status: Inpatient.    Rise Patience MD [facility] Pager [phone].  If 7PM-7AM, please contact night-coverage [email or web address] Password [email or web address]  11/29/2016, 3:49 AM

## 2016-11-29 NOTE — Progress Notes (Signed)
PHARMACY - PHYSICIAN COMMUNICATION CRITICAL VALUE ALERT - BLOOD CULTURE IDENTIFICATION (BCID)  Results for orders placed or performed during the hospital encounter of 11/28/16  Blood Culture ID Panel (Reflexed) (Collected: 11/28/2016  8:15 PM)  Result Value Ref Range   Enterococcus species NOT DETECTED NOT DETECTED   Listeria monocytogenes NOT DETECTED NOT DETECTED   Staphylococcus species DETECTED (A) NOT DETECTED   Staphylococcus aureus NOT DETECTED NOT DETECTED   Methicillin resistance NOT DETECTED NOT DETECTED   Streptococcus species NOT DETECTED NOT DETECTED   Streptococcus agalactiae NOT DETECTED NOT DETECTED   Streptococcus pneumoniae NOT DETECTED NOT DETECTED   Streptococcus pyogenes NOT DETECTED NOT DETECTED   Acinetobacter baumannii NOT DETECTED NOT DETECTED   Enterobacteriaceae species NOT DETECTED NOT DETECTED   Enterobacter cloacae complex NOT DETECTED NOT DETECTED   Escherichia coli NOT DETECTED NOT DETECTED   Klebsiella oxytoca NOT DETECTED NOT DETECTED   Klebsiella pneumoniae NOT DETECTED NOT DETECTED   Proteus species NOT DETECTED NOT DETECTED   Serratia marcescens NOT DETECTED NOT DETECTED   Haemophilus influenzae NOT DETECTED NOT DETECTED   Neisseria meningitidis NOT DETECTED NOT DETECTED   Pseudomonas aeruginosa NOT DETECTED NOT DETECTED   Candida albicans NOT DETECTED NOT DETECTED   Candida glabrata NOT DETECTED NOT DETECTED   Candida krusei NOT DETECTED NOT DETECTED   Candida parapsilosis NOT DETECTED NOT DETECTED   Candida tropicalis NOT DETECTED NOT DETECTED    Name of physician (or Provider) Contacted: K Schorr  Changes to prescribed antibiotics required: No changes as of yet, will let 1st follow up  Nani Skillern Crowford 11/29/2016  11:12 PM

## 2016-11-29 NOTE — Progress Notes (Signed)
Pt having periods of apnea, with snoring  respirations and  dropping sats down to 70's.  Resp therapist called to evaluate pt, placed on 35% VM due to mouth breathing.  Pt will flap her right hand with the mittens on, refuses oral care,and fights at staff when care given.

## 2016-11-29 NOTE — ED Notes (Signed)
[facility] [phone], Sheryl [name] [phone]

## 2016-11-29 NOTE — Progress Notes (Signed)
[facility]  Patient admitted overnight by my colleague. I have examined her today and reviewed the H and P and chart.   Exam notable for confusion, severe dry oral mucosa and tachycardia.   Principal Problem:   Acute encephalopathy - suspect it is an effect of chronic meds in setting of mild AKI (see labs in chart when Cr was elevated recently) vs primary neuro process- - has h/o neurosarcoidoses and recent lower back surgery - awaiting MRI Brain, Thoracic and lumbar spines - cont antibiotics and Acyclovir for now   Active Problems:  AKI with mild metabolic acidosis - baseline Cr 0.9-  Noted to be 1.9 on admission - cont to hydrate and follow    Chronic lymphocytic leukemia -being followed as outpt    Essential hypertension - PRN IV Hydralazine   ? Underlying psychotic disorder - patient is noted to be on Haldol Q 4 PRN, Zyprexa, Prozac, and Ativan PRN as oupt.   Low normal B12 - replace with s/c B12  Hypomagnesemia - replace   Debbe Odea, MD

## 2016-11-29 NOTE — ED Notes (Signed)
Called unit to giver report. Nurse will call back.

## 2016-11-29 NOTE — Progress Notes (Signed)
Pharmacy Antibiotic Note  Sheryl [name] is a 71 y.o. female admitted on 11/28/2016 with meningitis.  Pharmacy has been consulted for Vancomycin, ceftriaxone, acyclovir, ampicillin dosing.  Plan: Vancomycin 750mg  IV every 24 hours.  Goal trough 15-20 mcg/mL.  Ceftriaxone 2gm iv q12hr Acyclovir 700mg  iv q12hr Ampicillin 2gm iv q6hr   Height: 5' (152.4 cm) Weight: 153 lb 10.6 oz (69.7 kg) IBW/kg (Calculated) : 45.5  Temp (24hrs), Avg:99.2 F (37.3 C), Min:98.8 F (37.1 C), Max:99.5 F (37.5 C)   Recent Labs Lab 11/28/16 2021 11/28/16 2029 11/29/16 0530  WBC 7.9  --  9.0  CREATININE 1.95*  --   --   LATICACIDVEN  --  1.73  --     Estimated Creatinine Clearance: 24.1 mL/min (A) (by C-G formula based on SCr of 1.95 mg/dL (H)).    Allergies  Allergen Reactions  . Adhesive [Tape] Rash    Redness from tape.   . Latex Anaphylaxis, Rash and Other (See Comments)    Added based on information entered during case entry, please review and add reactions, type, and severity as needed. Patient cannot confirm this allergy.  . Cyclophosphamide Other (See Comments)    Caused diverticulitis.  She would not eat or drink for days, while taking it.  . Sulfa Antibiotics     PER MAR    Antimicrobials this admission: Vancomycin 11/29/2016 >> Ceftriaxone 11/29/2016 >>  Acyclovir 11/29/2016 >> Ampicillin 11/29/2016 >>   Dose adjustments this admission: -  Microbiology results: pending  Thank you for allowing pharmacy to be a part of this patient's care.  Nani Skillern Crowford 11/29/2016 6:09 AM

## 2016-11-30 DIAGNOSIS — N179 Acute kidney failure, unspecified: Secondary | ICD-10-CM

## 2016-11-30 DIAGNOSIS — R7881 Bacteremia: Secondary | ICD-10-CM

## 2016-11-30 DIAGNOSIS — D8689 Sarcoidosis of other sites: Secondary | ICD-10-CM

## 2016-11-30 DIAGNOSIS — E785 Hyperlipidemia, unspecified: Secondary | ICD-10-CM

## 2016-11-30 DIAGNOSIS — E870 Hyperosmolality and hypernatremia: Secondary | ICD-10-CM

## 2016-11-30 LAB — BASIC METABOLIC PANEL
Anion gap: 6 (ref 5–15)
BUN: 21 mg/dL — ABNORMAL HIGH (ref 6–20)
CO2: 22 mmol/L (ref 22–32)
Calcium: 8.9 mg/dL (ref 8.9–10.3)
Chloride: 119 mmol/L — ABNORMAL HIGH (ref 101–111)
Creatinine, Ser: 0.97 mg/dL (ref 0.44–1.00)
GFR calc Af Amer: >60 mL/min (ref >60)
GFR calc non Af Amer: 59 mL/min — ABNORMAL LOW (ref >60)
Glucose, Bld: 146 mg/dL — ABNORMAL HIGH (ref 65–99)
Potassium: 4.1 mmol/L (ref 3.5–5.1)
Sodium: 147 mmol/L — ABNORMAL HIGH (ref 135–145)

## 2016-11-30 LAB — CBC WITH DIFFERENTIAL/PLATELET
Basophils Absolute: 0 10*3/uL (ref 0.0–0.1)
Basophils Relative: 0 %
Eosinophils Absolute: 0.1 10*3/uL (ref 0.0–0.7)
Eosinophils Relative: 1 %
HCT: 36.5 % (ref 36.0–46.0)
Hemoglobin: 11.8 g/dL — ABNORMAL LOW (ref 12.0–15.0)
Lymphocytes Relative: 12 %
Lymphs Abs: 1 10*3/uL (ref 0.7–4.0)
MCH: 27.6 pg (ref 26.0–34.0)
MCHC: 32.3 g/dL (ref 30.0–36.0)
MCV: 85.5 fL (ref 78.0–100.0)
Monocytes Absolute: 0.7 10*3/uL (ref 0.1–1.0)
Monocytes Relative: 8 %
Neutro Abs: 6.5 10*3/uL (ref 1.7–7.7)
Neutrophils Relative %: 78 %
Platelets: 183 10*3/uL (ref 150–400)
RBC: 4.27 MIL/uL (ref 3.87–5.11)
RDW: 15.8 % — ABNORMAL HIGH (ref 11.5–15.5)
WBC: 8.3 10*3/uL (ref 4.0–10.5)

## 2016-11-30 LAB — SODIUM, URINE, RANDOM: Sodium, Ur: 113 mmol/L

## 2016-11-30 LAB — URINE CULTURE
Culture: NO GROWTH
Special Requests: NORMAL

## 2016-11-30 LAB — GLUCOSE, CAPILLARY
Glucose-Capillary: 129 mg/dL — ABNORMAL HIGH (ref 65–99)
Glucose-Capillary: 127 mg/dL — ABNORMAL HIGH (ref 65–99)
Glucose-Capillary: 137 mg/dL — ABNORMAL HIGH (ref 65–99)
Glucose-Capillary: 141 mg/dL — ABNORMAL HIGH (ref 65–99)

## 2016-11-30 LAB — MAGNESIUM: Magnesium: 1.8 mg/dL (ref 1.7–2.4)

## 2016-11-30 LAB — CREATININE, URINE, RANDOM: Creatinine, Urine: 96.88 mg/dL

## 2016-11-30 MED ORDER — METOPROLOL TARTRATE 5 MG/5ML IV SOLN
5.0000 mg | Freq: Four times a day (QID) | INTRAVENOUS | Status: DC
  Administered 2016-11-30: 5 mg via INTRAVENOUS
  Filled 2016-11-30: qty 5

## 2016-11-30 MED ORDER — DEXAMETHASONE SODIUM PHOSPHATE 10 MG/ML IJ SOLN
10.0000 mg | Freq: Four times a day (QID) | INTRAMUSCULAR | Status: DC
  Administered 2016-11-30 – 2016-12-01 (×4): 10 mg via INTRAVENOUS
  Filled 2016-11-30 (×4): qty 1

## 2016-11-30 MED ORDER — SODIUM CHLORIDE 0.45 % IV SOLN
INTRAVENOUS | Status: DC
  Administered 2016-11-30 – 2016-12-02 (×3): via INTRAVENOUS

## 2016-11-30 MED ORDER — SODIUM CHLORIDE 0.9 % IV SOLN
2.0000 g | Freq: Four times a day (QID) | INTRAVENOUS | Status: DC
  Administered 2016-11-30 – 2016-12-01 (×2): 2 g via INTRAVENOUS
  Filled 2016-11-30 (×4): qty 2000

## 2016-11-30 MED ORDER — SODIUM CHLORIDE 0.9 % IV SOLN
INTRAVENOUS | Status: DC
  Administered 2016-11-30: 09:00:00 via INTRAVENOUS

## 2016-11-30 MED ORDER — SODIUM CHLORIDE 0.9 % IV SOLN: 2.0000 g | Freq: Four times a day (QID) | INTRAVENOUS | Status: DC

## 2016-11-30 MED ORDER — METOPROLOL TARTRATE 5 MG/5ML IV SOLN
5.0000 mg | Freq: Three times a day (TID) | INTRAVENOUS | Status: DC
  Administered 2016-12-01 – 2016-12-03 (×4): 5 mg via INTRAVENOUS
  Filled 2016-11-30 (×6): qty 5

## 2016-11-30 MED ORDER — CYANOCOBALAMIN 1000 MCG/ML IJ SOLN
1000.0000 ug | Freq: Every day | INTRAMUSCULAR | Status: DC
  Administered 2016-11-30 – 2016-12-05 (×5): 1000 ug via SUBCUTANEOUS
  Filled 2016-11-30 (×6): qty 1

## 2016-11-30 MED ORDER — MAGNESIUM SULFATE 4 GM/100ML IV SOLN
4.0000 g | Freq: Once | INTRAVENOUS | Status: AC
  Administered 2016-11-30: 4 g via INTRAVENOUS
  Filled 2016-11-30: qty 100

## 2016-11-30 MED ORDER — VANCOMYCIN HCL 500 MG IV SOLR
500.0000 mg | Freq: Two times a day (BID) | INTRAVENOUS | Status: DC
  Filled 2016-11-30 (×2): qty 500

## 2016-11-30 MED ORDER — SODIUM CHLORIDE 0.45 % IV SOLN: INTRAVENOUS | Status: DC

## 2016-11-30 NOTE — Clinical Social Work Note (Signed)
Clinical Social Work Assessment  Patient Details  Name: Sheryl [name] MRN: [id number] Date of Birth: [birth year]  Date of referral:  11/30/16               Reason for consult:  Facility Placement, Discharge Planning                Permission sought to share information with:  Chartered certified accountant granted to share information::  Yes, Verbal Permission Granted  Name::        Agency::     Relationship::     Contact Information:     Housing/Transportation Living arrangements for the past 2 months:  [place], [place] of Information:  Spouse, Facility, Other (Comment Required) (Brother in Sports coach ) Patient Interpreter Needed:  None Criminal Activity/Legal Involvement Pertinent to Current Situation/Hospitalization:  No - Comment as needed Significant Relationships:  Spouse, Other Family Members Lives with:  Facility Resident, Spouse Do you feel safe going back to the place where you live?   (Spouse would like pt to return to SNF.) Need for family participation in patient care:  Yes (Comment)  Care giving concerns:  No care giving concerns reported at this time.   Social Worker assessment / plan:  Pt hospitalized on 11/28/16 from [facility] ( [facility] ) with acute encephalopathy. CSW consulted to assist with d/c planning. PN reviewed. Pt is oriented x1 and unable to assist with d/c planning. CSW spoke with pt's spouse and pt's brother in law Sheryl [name] [phone] ) to offer assistance. Spouse is being treated for cancer and his brother is his caregiver. Brother in law reports that a medical update would be appreciated. CSW has left message for MD. Family would like pt to return to [facility] at d/c. CSW has contacted SNF, clinicals sent for review. SNF will readmit when pt is stable for dc. CSW will continue to follow to assist with d/c planning.  Employment status:  Disabled (Comment on whether or not currently receiving Disability) Insurance  information:  Medicare PT Recommendations:  Not assessed at this time Information / Referral to community resources:     Patient/Family's Response to care:  Family has been please with pt's care at [facility] and would like pt to return at d/c.  Patient/Family's Understanding of and Emotional Response to Diagnosis, Current Treatment, and Prognosis:  Family requesting medical update. Message left for MD with this request.   Emotional Assessment Appearance:  Appears stated age Attitude/Demeanor/Rapport:  Unable to Assess Affect (typically observed):  Unable to Assess Orientation:  Oriented to Self Alcohol / Substance use:  Not Applicable Psych involvement (Current and /or in the community):  No (Comment)  Discharge Needs  Concerns to be addressed:  Discharge Planning Concerns Readmission within the last 30 days:  No Current discharge risk:  None Barriers to Discharge:  No Barriers Identified   Luretha Rued, [place] 11/30/2016, 9:49 AM

## 2016-11-30 NOTE — Progress Notes (Signed)
PROGRESS NOTE    Sheryl [name]  YQI:[id number] DOB: 07/03/1945 DOA: 11/28/2016 PCP: Marletta Lor, MD    Brief Narrative:  Sheryl [name] is a 71 y.o. female with history of neurosarcoidosis presently off medications, history of CLL, fibromyalgia, hypertension who has had recent low back surgery at [facility] last month was brought in from skilled nursing facility after patient was found to be increasingly confused for the last 24 hours. There was no mention of any nausea vomiting chest pain shortness of breath diarrhea fever or chills.   ED Course: In the ER patient has periodic episodes of confusion with agitation. It is not known if patient has had been on any new medications or any medications have been taken off. Patient's family is not available to give more further history. On exam patient is on seeing questions appropriately for certain time and sometimes gets confused. Follows commands and moves all extremities. Patient states she was treated for UTI last week. The only complaint patient has his mid back pain around the shoulders. On exam patient has some neck rigidity. The scar over the recent surgery looks little erythematous but no active discharge. Lumbar CT shows no definite infection.   Assessment & Plan:   Principal Problem:   Acute encephalopathy Active Problems:   Bacteremia due to Gram-positive bacteria: Preliminary   Chronic lymphocytic leukemia (HCC)   Dyslipidemia   Essential hypertension   Neurosarcoidosis   Hypernatremia  #1 acute encephalopathy/?? Probable acute meningitis Patient presented with acute encephalopathy with confusion from nursing home. Patient would recent back surgery. Patient noted to have significant nuchal rigidity on examination. Blood cultures have been obtained and preliminary results with a staph species likely contaminant however final results are pending at this time. Urine cultures are negative. Chest x-ray no acute  abnormalities. CT head negative for any acute abnormalities. LP not attempted/difficult to be done as patient would recent surgery in the lumbar area. Patient does have a history of neurosarcoidosis and a such neurologist consulted. MRI of the brain, T and L-spine pending at this time and unable to be done due to movement and agitation. Urinalysis is unremarkable. Patient currently afebrile with a normal white count. Patient being treated empirically with IV vancomycin, IV Rocephin, IV ampicillin, IV acyclovir for presumed probable acute meningitis. Patient has been seen in consultation by neurology PA was recommended that patient be transferred to [facility] in able to be further evaluated and able to get a sedated MRI for further evaluation by neurology. Patient opens eyes to noxious stimuli and answers questions appropriately following commands. Continue empiric IV antibiotics. Will add IV Decadron. Patient has been pancultured results pending. Neurology following.  #2 preliminary bacteremia Blood cultures with a Staphylococcus species.?? Whether a true bacteremia versus contaminant. Will await final blood culture results. Patient empirically on IV antibiotics. Follow.  #3 hypertension Due to patient's lethargy patient's oral medications have been held. We'll place on IV Lopressor 5 mg every 6 hours. Follow.  #4 dyslipidemia Continue statin.  #5 acute on chronic renal failure Baseline creatinine 0.9. Creatinine noted to be 1.9 on admission. Improved with hydration.  #6 hypernatremia Change IV fluids to half-normal saline.  #7 low normal B-12 levels Continue iron B-12 injections.  #8 hypomagnesemia/hypokalemia Replete.  #9 Neurosarcoidosis F/U as outpatient    DVT prophylaxis: SCDs Code Status: Full Family Communication: Updated patient. No family at bedside. Disposition Plan: [facility] to [facility]   Consultants:   Neurology pending  Procedures:  CT head  11/28/2016  CT L-spine 11/29/2016  Chest x-ray 11/28/2016  Plain films of the L-spine 11/28/2016  Antimicrobials:  IV acyclovir 11/29/2016  IV ampicillin 11/29/2016  IV Rocephin 11/29/2016  IV vancomycin 11/29/2016   Subjective: Patient sleeping. Arousable by noxious stimuli. Answering questions appropriately. Moving extremities spontaneously. Denies any chest pain, no shortness of breath, no neck pain.  Objective: Vitals:   11/30/16 0700 11/30/16 0800 11/30/16 0900 11/30/16 1000  BP: (!) 189/58 (!) 172/67 (!) 177/54 (!) 168/57  Pulse: 99 93 (!) 107 97  Resp: 16 17 18 20   Temp:  98 F (36.7 C)    TempSrc:  Axillary    SpO2: 95% 100% 99% 99%  Weight:      Height:        Intake/Output Summary (Last 24 hours) at 11/30/16 1113 Last data filed at 11/30/16 1100  Gross per 24 hour  Intake             1500 ml  Output              725 ml  Net              775 ml   Filed Weights   11/28/16 2340 11/29/16 0308  Weight: 81.6 kg (180 lb) 69.7 kg (153 lb 10.6 oz)    Examination:  General exam: Lethargic. Opens eyes to noxious stimuli. Nuchal rigidity. Extremely dry mucous membranes. Respiratory system: Clear to auscultation. No wheezing. No crackles. Respiratory effort normal. Cardiovascular system: S1 & S2 heard, RRR. No JVD, murmurs, rubs, gallops or clicks. No pedal edema. Gastrointestinal system: Abdomen is nondistended, soft and nontender. No organomegaly or masses felt. Normal bowel sounds heard. Central nervous system: Alert and oriented. Nuchal rigidity. Moving extremities spontaneously.  Extremities: Symmetric 5 x 5 power. Skin: No rashes. Incision site on back between giant intact, minimal small opening in the lower back incision site, no erythema, no warmth, nontender to palpation. Psychiatry: Judgement and insight appear fair. Mood & affect appropriate.     Data Reviewed: I have personally reviewed following labs and imaging studies  CBC:  Recent  Labs Lab 11/28/16 2021 11/29/16 0530 11/30/16 0841  WBC 7.9 9.0 8.3  NEUTROABS 5.8 7.4 6.5  HGB 11.9* 10.4* 11.8*  HCT 36.4 33.1* 36.5  MCV 83.9 84.4 85.5  PLT 235 203 818   Basic Metabolic Panel:  Recent Labs Lab 11/28/16 2021 11/29/16 0530 11/30/16 0841  NA 143 143 147*  K 4.7 4.3 4.1  CL 112* 112* 119*  CO2 18* 20* 22  GLUCOSE 112* 135* 146*  BUN 51* 44* 21*  CREATININE 1.95* 1.66* 0.97  CALCIUM 9.5 9.1 8.9  MG  --  1.5* 1.8   GFR: Estimated Creatinine Clearance: 48.4 mL/min (by C-G formula based on SCr of 0.97 mg/dL). Liver Function Tests:  Recent Labs Lab 11/28/16 2021 11/29/16 0530  AST 34 32  ALT 20 20  ALKPHOS 122 110  BILITOT 0.6 0.6  PROT 6.2* 6.0*  ALBUMIN 3.8 3.4*   No results for input(s): LIPASE, AMYLASE in the last 168 hours.  Recent Labs Lab 11/29/16 0530  AMMONIA 12   Coagulation Profile: No results for input(s): INR, PROTIME in the last 168 hours. Cardiac Enzymes: No results for input(s): CKTOTAL, CKMB, CKMBINDEX, TROPONINI in the last 168 hours. BNP (last 3 results) No results for input(s): PROBNP in the last 8760 hours. HbA1C: No results for input(s): HGBA1C in the last 72 hours. CBG:  Recent Labs Lab 11/29/16  1611 11/29/16 1946 11/29/16 2344 11/30/16 0535 11/30/16 0807  GLUCAP 151* 137* 93 129* 127*   Lipid Profile: No results for input(s): CHOL, HDL, LDLCALC, TRIG, CHOLHDL, LDLDIRECT in the last 72 hours. Thyroid Function Tests:  Recent Labs  11/29/16 0530  TSH 1.318   Anemia Panel:  Recent Labs  11/29/16 0530  VITAMINB12 281   Sepsis Labs:  Recent Labs Lab 11/28/16 2029 11/29/16 0530 11/29/16 0845  PROCALCITON  --  0.53  --   LATICACIDVEN 1.73 1.3 1.2    Recent Results (from the past 240 hour(s))  Culture, blood (routine x 2)     Status: None (Preliminary result)   Collection Time: 11/28/16  8:15 PM  Result Value Ref Range Status   Specimen Description BLOOD RIGHT FOREARM  Final   Special  Requests   Final    BOTTLES DRAWN AEROBIC AND ANAEROBIC Blood Culture adequate volume   Culture  Setup Time   Final    GRAM POSITIVE COCCI IN CLUSTERS ANAEROBIC BOTTLE ONLY Organism ID to follow CRITICAL RESULT CALLED TO, READ BACK BY AND VERIFIED WITH: Lavell Luster [facility] [id number] 11/29/16 A BROWNING    Culture   Final    GRAM POSITIVE COCCI TOO YOUNG TO READ Performed at [facility], [place] [address], [place], [place] [address]    Report Status PENDING  Incomplete  Urine culture     Status: None   Collection Time: 11/28/16  8:15 PM  Result Value Ref Range Status   Specimen Description URINE, CLEAN CATCH  Final   Special Requests Normal  Final   Culture   Final    NO GROWTH Performed at [facility], [address] [place], [place] [address]    Report Status 11/30/2016 FINAL  Final  Blood Culture ID Panel (Reflexed)     Status: Abnormal   Collection Time: 11/28/16  8:15 PM  Result Value Ref Range Status   Enterococcus species NOT DETECTED NOT DETECTED Final   Listeria monocytogenes NOT DETECTED NOT DETECTED Final   Staphylococcus species DETECTED (A) NOT DETECTED Final    Comment: Methicillin (oxacillin) susceptible coagulase negative staphylococcus. Possible blood culture contaminant (unless isolated from more than one blood culture draw or clinical case suggests pathogenicity). No antibiotic treatment is indicated for blood  culture contaminants. CRITICAL RESULT CALLED TO, READ BACK BY AND VERIFIED WITH: Lavell Luster [facility] 2224 11/29/16 A BROWNING    Staphylococcus aureus NOT DETECTED NOT DETECTED Final   Methicillin resistance NOT DETECTED NOT DETECTED Final   Streptococcus species NOT DETECTED NOT DETECTED Final   Streptococcus agalactiae NOT DETECTED NOT DETECTED Final   Streptococcus pneumoniae NOT DETECTED NOT DETECTED Final   Streptococcus pyogenes NOT DETECTED NOT DETECTED Final   Acinetobacter baumannii NOT DETECTED NOT DETECTED Final   Enterobacteriaceae species  NOT DETECTED NOT DETECTED Final   Enterobacter cloacae complex NOT DETECTED NOT DETECTED Final   Escherichia coli NOT DETECTED NOT DETECTED Final   Klebsiella oxytoca NOT DETECTED NOT DETECTED Final   Klebsiella pneumoniae NOT DETECTED NOT DETECTED Final   Proteus species NOT DETECTED NOT DETECTED Final   Serratia marcescens NOT DETECTED NOT DETECTED Final   Haemophilus influenzae NOT DETECTED NOT DETECTED Final   Neisseria meningitidis NOT DETECTED NOT DETECTED Final   Pseudomonas aeruginosa NOT DETECTED NOT DETECTED Final   Candida albicans NOT DETECTED NOT DETECTED Final   Candida glabrata NOT DETECTED NOT DETECTED Final   Candida krusei NOT DETECTED NOT DETECTED Final   Candida  parapsilosis NOT DETECTED NOT DETECTED Final   Candida tropicalis NOT DETECTED NOT DETECTED Final    Comment: Performed at [facility], [place] [address] [place], [place] [address]  MRSA PCR Screening     Status: None   Collection Time: 11/29/16  4:14 AM  Result Value Ref Range Status   MRSA by PCR NEGATIVE NEGATIVE Final    Comment:        The GeneXpert MRSA Assay (FDA approved for NASAL specimens only), is one component of a comprehensive MRSA colonization surveillance program. It is not intended to diagnose MRSA infection nor to guide or monitor treatment for MRSA infections.          Radiology Studies: Dg Chest 2 View  Result Date: 11/28/2016 CLINICAL DATA:  Confusion EXAM: CHEST  2 VIEW COMPARISON:  10/28/2016, 10/25/2016 FINDINGS: Cervical postsurgical changes. No acute consolidation or effusion. Partially visualized posterior spinal rods and screws in the thoracolumbar spine. Normal cardiomediastinal silhouette. No pneumothorax. IMPRESSION: No acute infiltrate or edema Electronically Signed   By: Donavan Foil M.D.   On: 11/28/2016 21:15   Dg Lumbar Spine Complete  Result Date: 11/28/2016 CLINICAL DATA:  History of back surgery EXAM: LUMBAR SPINE - COMPLETE 4+ VIEW COMPARISON:   10/28/2016 FINDINGS: Surgical clips in the right upper quadrant. SI joints are symmetric. Calcified pelvic phleboliths. Status post posterior stabilization rod and fixating screws from T10 through L5 with interbody device at L3-L4 and L4-L5. Similar appearance of the hardware. The vertebral body heights are grossly maintained. IMPRESSION: Postsurgical changes of the thoracolumbar spine. No definite acute osseous abnormality. Electronically Signed   By: Donavan Foil M.D.   On: 11/28/2016 21:18   Ct Head Wo Contrast  Result Date: 11/28/2016 CLINICAL DATA:  Confusion and hallucinations. EXAM: CT HEAD WITHOUT CONTRAST TECHNIQUE: Contiguous axial images were obtained from the base of the skull through the vertex without intravenous contrast. COMPARISON:  01/07/2016 FINDINGS: Brain: No evidence of acute infarction, hemorrhage, hydrocephalus, extra-axial collection or mass lesion/mass effect. Again seen is a small area of encephalomalacia in the right temporal lobe. Moderate brain parenchymal volume loss. Vascular: No hyperdense vessel or unexpected calcification. Skull: Probable postsurgical changes from prior right temporal craniotomy, stable. Sinuses/Orbits: No acute finding. Other: None. IMPRESSION: No acute intracranial abnormality. Mild brain parenchymal volume loss. Stable changes from probable right temporal craniotomy with area of cortical encephalomalacia. Electronically Signed   By: Fidela Salisbury M.D.   On: 11/28/2016 22:00   Ct Lumbar Spine Wo Contrast  Result Date: 11/29/2016 CLINICAL DATA:  Initial evaluation for altered mental status, recent lumbar surgery. EXAM: CT LUMBAR SPINE WITHOUT CONTRAST TECHNIQUE: Multidetector CT imaging of the lumbar spine was performed without intravenous contrast administration. Multiplanar CT image reconstructions were also generated. COMPARISON:  Prior radiograph from 11/28/2016. FINDINGS: Segmentation: Normal segmentation. Lowest well-formed disc is labeled the  L5-S1 level. Alignment: Chronic trace anterolisthesis of L4 on L5, stable. Previously seen retrolisthesis of L3 on L4 no longer clearly seen. Trace retrolisthesis of L1 on L2. Vertebrae: Patient is status post extensive thoracolumbar fusion at T10 through L5. Bilateral transpedicular screws with interlocking rods in place at these levels. Interbody implants in place at L3-4 and L4-5. Solid osseous fusion at the L4-5 level. Patient is status post wide posterior decompression at L3-4. No evidence for hardware complication. Vertebral body heights maintained. No acute osseous abnormality identified. No discrete or worrisome osseous lesion. Visualized sacrum intact. SI joints symmetric and within normal limits. Paraspinal and other soft tissues:  Postoperative changes seen within the paraspinous soft tissues of the lower back. No acute soft tissue abnormality identified. Curvilinear soft tissue calcifications within the posterior paraspinous musculature extending from T11 through L2-3 likely postoperative in nature. No significant soft tissue swelling or inflammatory stranding to suggest acute infection or inflammation on this noncontrast examination. Moderate aortic atherosclerosis noted. Visualized visceral structures otherwise unremarkable. Partially visualized lung bases are clear. Disc levels: Degenerative disc bulge with moderate facet arthropathy noted at L5-S1, with resultant mild to moderate bilateral L5 foraminal narrowing. No other significant stenosis identified within the visualized thoracolumbar spine. Please note evaluation limited by extensive streak artifact from fusion hardware. IMPRESSION: 1. Postoperative changes from prior removed thoracolumbar fusion at T10 through L5 without complication. 2. Postoperative changes within the paraspinous soft tissues as above. No significant swelling or inflammatory changes identified to suggest infection or other acute abnormality on this noncontrast examination. 3.  Moderate aortic atherosclerosis. Electronically Signed   By: Jeannine Boga M.D.   On: 11/29/2016 01:26        Scheduled Meds: . chlorhexidine  15 mL Mouth Rinse BID  . cyanocobalamin  1,000 mcg Subcutaneous Daily  . dexamethasone  10 mg Intravenous Q6H  . dronabinol  2.5 mg Oral BID AC  . FLUoxetine  20 mg Oral Daily  . mouth rinse  15 mL Mouth Rinse q12n4p  . metoprolol tartrate  5 mg Intravenous Q6H  . OLANZapine  2.5 mg Oral QHS   Continuous Infusions: . sodium chloride 125 mL/hr at 11/30/16 1100  . acyclovir 700 mg (11/30/16 1100)  . ampicillin (OMNIPEN) IV 2 g (11/30/16 0547)  . cefTRIAXone (ROCEPHIN)  IV Stopped (11/30/16 0930)  . magnesium sulfate 1 - 4 g bolus IVPB    . [START ON 12/01/2016] vancomycin       LOS: 1 day    Time spent: 63 minutes    Ayano Douthitt, MD [facility] Pager [phone]  If 7PM-7AM, please contact night-coverage [email or web address] Password [email or web address] 11/30/2016, 11:13 AM

## 2016-11-30 NOTE — Consult Note (Signed)
NEURO HOSPITALIST CONSULT NOTE   Requestig physician: Dr. Wynelle Cleveland   Reason for Consult: Confusion   History obtained from:   Chart   HPI:                                                                                                                                          Sheryl [name] is an 71 y.o. female "with history of neurosarcoidosis presently off medications, history of CLL, fibromyalgia, hypertension who has had recent low back surgery at [facility] last month was brought in from skilled nursing facility after patient was found to be increasingly confused for the last 24 hours. There was no mention of any nausea vomiting chest pain shortness of breath diarrhea fever or chills. In the ER patient has periodic episodes of confusion with agitation. It is not known if patient has had been on any new medications or any medications have been taken off. Patient's family is not available to give more further history. On exam patient is on seeing questions appropriately for certain time and sometimes gets confused. Follows commands and moves all extremities. Patient states she was treated for UTI last week. The only complaint patient has his mid back pain around the shoulders. Patient did not have LP for concern of possible infection and recent back surgery. That she was placed on empiric antibiotics for meningitis at that time. Thus far metabolic profile has not revealed any source that would cause confusion and neurology was asked to see patient for concern that this confusion could be worsening of neuro sarcoidosis. Unfortunately no family members are available. Currently patient is extremely lethargic able to only follow minimal commands is unable to give me any information.  Past Medical History:  Diagnosis Date  . Allergy   . Cataract   . 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 DEGENERATIVE JOINT DISEASE 12/14/2006  . EXOGENOUS OBESITY 12/14/2006  . FIBROMYALGIA 12/14/2006  .  Gallstones   . Hearing loss of left ear   . HYPERLIPIDEMIA 12/14/2006  . HYPERTENSION 12/14/2006  . IBS (irritable bowel syndrome)   . Neurosarcoidosis   . Sinusitis nasal 06/24/2016  . Sleep apnea    no CPAP  . Vision loss of right eye     Past Surgical History:  Procedure Laterality Date  . BRAIN BIOPSY     [facility]  . CERVICAL FUSION    . CHOLECYSTECTOMY    . COLONOSCOPY    . LUMBAR FUSION  07/25/2012  . TONSILLECTOMY      Family History  Problem Relation Age of Onset  . Diabetes Sister   . Hypertension Mother   . Emphysema Father   . Colon cancer Neg Hx   . Esophageal cancer Neg Hx   . Rectal cancer Neg Hx   .  Stomach cancer Neg Hx      Social History:  reports that she has never smoked. She has never used smokeless tobacco. She reports that she does not drink alcohol or use drugs.  Allergies  Allergen Reactions  . Adhesive [Tape] Rash    Redness from tape.   . Latex Anaphylaxis, Rash and Other (See Comments)    Added based on information entered during case entry, please review and add reactions, type, and severity as needed. Patient cannot confirm this allergy.  . Cyclophosphamide Other (See Comments)    Caused diverticulitis.  She would not eat or drink for days, while taking it.  . Sulfa Antibiotics     PER MAR    MEDICATIONS:                                                                                                                     Prior to Admission:  Prescriptions Prior to Admission  Medication Sig Dispense Refill Last Dose  . acetaminophen (TYLENOL) 325 MG tablet Take 650 mg by mouth every 6 (six) hours as needed.   [place]  . albuterol (PROVENTIL HFA;VENTOLIN HFA) 108 (90 BASE) MCG/ACT inhaler Inhale 2 puffs into the lungs every 4 (four) hours as needed. Reported on 09/24/2015   unknown  . aspirin 81 MG tablet Take 81 mg by mouth daily.     11/28/2016 at Unknown time  . atorvastatin (LIPITOR) 40 MG tablet Take 1 tablet (40 mg total) by mouth daily.  90 tablet 3 11/27/2016 at Unknown time  . brimonidine (ALPHAGAN) 0.15 % ophthalmic solution Place 1 drop into the left eye 3 (three) times daily.    11/28/2016 at Unknown time  . cetirizine (ZYRTEC) 10 MG tablet Take 10 mg by mouth daily.     11/28/2016 at Unknown time  . Cholecalciferol (VITAMIN D3) 2000 UNITS TABS Take 1 tablet by mouth daily.   11/28/2016 at Unknown time  . docusate sodium (COLACE) 100 MG capsule Take 100 mg by mouth 2 (two) times daily.   11/28/2016 at Unknown time  . dronabinol (MARINOL) 2.5 MG capsule Take 2.5 mg by mouth 2 (two) times daily before a meal.   11/27/2016 at Unknown time  . FLUoxetine (PROZAC) 20 MG capsule Take 20 mg by mouth daily.   11/28/2016 at Unknown time  . fluticasone (FLONASE) 50 MCG/ACT nasal spray Place 1 spray into both nostrils daily as needed for allergies.    [place]  . haloperidol (HALDOL) 2 MG/ML solution Take 2 mg by mouth every 4 (four) hours as needed for agitation.   11/28/2016 at Unknown time  . lisinopril (PRINIVIL,ZESTRIL) 40 MG tablet Take 1 tablet (40 mg total) by mouth daily. (Patient taking differently: Take 10 mg by mouth daily. ) 90 tablet 2 11/28/2016 at Unknown time  . LORazepam (ATIVAN) 2 MG/ML injection Inject 1 mg into the vein every 6 (six) hours as needed (AGITATION).   11/27/2016 at Unknown time  . OLANZapine (ZYPREXA) 2.5 MG tablet Take 2.5  mg by mouth at bedtime.   11/27/2016 at Unknown time  . oxybutynin (DITROPAN-XL) 10 MG 24 hr tablet Take 10 mg by mouth at bedtime.   11/27/2016 at Unknown time  . oxyCODONE-acetaminophen (PERCOCET/ROXICET) 5-325 MG tablet Take 1 tablet by mouth every 8 (eight) hours as needed for severe pain.   [place]  . pantoprazole (PROTONIX) 40 MG tablet Take 40 mg by mouth daily.   11/28/2016 at Unknown time  . polyethylene glycol (MIRALAX / GLYCOLAX) packet Take 17 g by mouth daily.   11/28/2016 at Unknown time  . predniSONE (DELTASONE) 2.5 MG tablet Take 1 tablet (2.5 mg total) by mouth daily. 90 tablet 2 11/28/2016 at Unknown time  .  sodium chloride (OCEAN) 0.65 % SOLN nasal spray Place 1 spray into both nostrils as needed for congestion.   [place]  . traMADol (ULTRAM) 50 MG tablet Take 50 mg by mouth every 6 (six) hours as needed.   [place]  . hyoscyamine (LEVSIN SL) 0.125 MG SL tablet Place 1 tablet (0.125 mg total) under the tongue every 4 (four) hours as needed. (Patient not taking: Reported on 11/28/2016) 60 tablet 5 Not Taking at Unknown time  . LORazepam (ATIVAN) 1 MG tablet Take 1 tablet (1 mg total) by mouth 2 (two) times daily as needed for anxiety. (Patient not taking: Reported on 11/28/2016) 30 tablet 1 Not Taking at Unknown time  . MYRBETRIQ 50 MG TB24 tablet Take 1 tablet (50 mg total) by mouth daily. (Patient not taking: Reported on 10/14/2016) 90 tablet 2 Not Taking at Unknown time  . omeprazole (PRILOSEC) 20 MG capsule TAKE 1 CAPSULE ONE TIME DAILY (Patient not taking: Reported on 11/28/2016) 90 capsule 3 Not Taking at Unknown time   Scheduled: . chlorhexidine  15 mL Mouth Rinse BID  . cyanocobalamin  1,000 mcg Subcutaneous Daily  . dronabinol  2.5 mg Oral BID AC  . FLUoxetine  20 mg Oral Daily  . mouth rinse  15 mL Mouth Rinse q12n4p  . OLANZapine  2.5 mg Oral QHS  . predniSONE  2.5 mg Oral Daily     ROS:                                                                                                                                       History obtained from unobtainable from patient due to Confusion and lethargy  General ROS: negative for - chills, fatigue, fever, night sweats, weight gain or weight loss Psychological ROS: negative for - behavioral disorder, hallucinations, memory difficulties, mood swings or suicidal ideation Ophthalmic ROS: negative for - blurry vision, double vision, eye pain or loss of vision ENT ROS: negative for - epistaxis, nasal discharge, oral lesions, sore throat, tinnitus or vertigo Allergy and Immunology ROS: negative for - hives or itchy/watery eyes Hematological and Lymphatic ROS:  negative for - bleeding problems, bruising or swollen lymph nodes Endocrine ROS: negative for -  galactorrhea, hair pattern changes, polydipsia/polyuria or temperature intolerance Respiratory ROS: negative for - cough, hemoptysis, shortness of breath or wheezing Cardiovascular ROS: negative for - chest pain, dyspnea on exertion, edema or irregular heartbeat Gastrointestinal ROS: negative for - abdominal pain, diarrhea, hematemesis, nausea/vomiting or stool incontinence Genito-Urinary ROS: negative for - dysuria, hematuria, incontinence or urinary frequency/urgency Musculoskeletal ROS: negative for - joint swelling or muscular weakness Neurological ROS: as noted in HPI Dermatological ROS: negative for rash and skin lesion changes   Blood pressure (!) 177/54, pulse (!) 107, temperature 98 F (36.7 C), temperature source Axillary, resp. rate 18, height 5' (1.524 m), weight 69.7 kg (153 lb 10.6 oz), SpO2 99 %.   Neurologic Examination:                                                                                                      HEENT-  Normocephalic, no lesions, without obvious abnormality.  Normal external eye and conjunctiva.  Normal TM's bilaterally.  Normal auditory canals and external ears. Normal external nose, mucus membranes and septum.  Normal pharynx. Cardiovascular- S1, S2 normal, pulses palpable throughout   Lungs- chest clear, no wheezing, rales, normal symmetric air entry Abdomen- normal findings: bowel sounds normal Extremities- no edema Lymph-no adenopathy palpable Musculoskeletal-no joint tenderness, deformity or swelling Skin-warm and dry, no hyperpigmentation, vitiligo, or suspicious lesions  Neurological Examination Mental Status: Patient is extremely lethargic and unable to follow minimal commands other than squeezing my hand. She does wince to noxious stimuli. Currently has mouth open and mouth breathing. Cranial Nerves: II: Patient blinks to threat bilaterally,  she tends to hold her eyelids closed when I'm attempting to visualize her pupils. III,IV, VI:  Pupils are anisocoric with her right being greater than left by 1 mm. They're sluggishly reactive. Given from ophthalmology's note she has bilateral optic neuropathies right greater than left this would make sense with the larger pupil on the right than left. However he does not make note of the size of her pupils during his exam Doll's intact intact bilaterally pupils equal, round, reactive to light and accommodation V,VII: Face symmetric, winces to noxious stimuli  Motor: Moving all extremities antigravity when noxious stimuli is given with strength of 4/5 if not greater Sensory: Pinprick and light touch intact throughout, bilaterally Deep Tendon Reflexes: 2+ and symmetric throughout Plantars: Upgoing bilaterally Cerebellar: Unable to assess  Gait: Not tested      Lab Results: Basic Metabolic Panel:  Recent Labs Lab 11/28/16 2021 11/29/16 0530 11/30/16 0841  NA 143 143 147*  K 4.7 4.3 4.1  CL 112* 112* 119*  CO2 18* 20* 22  GLUCOSE 112* 135* 146*  BUN 51* 44* 21*  CREATININE 1.95* 1.66* 0.97  CALCIUM 9.5 9.1 8.9  MG  --  1.5* 1.8    Liver Function Tests:  Recent Labs Lab 11/28/16 2021 11/29/16 0530  AST 34 32  ALT 20 20  ALKPHOS 122 110  BILITOT 0.6 0.6  PROT 6.2* 6.0*  ALBUMIN 3.8 3.4*   No results for input(s): LIPASE, AMYLASE in the last 168 hours.  Recent Labs Lab 11/29/16 0530  AMMONIA 12    CBC:  Recent Labs Lab 11/28/16 2021 11/29/16 0530 11/30/16 0841  WBC 7.9 9.0 8.3  NEUTROABS 5.8 7.4 6.5  HGB 11.9* 10.4* 11.8*  HCT 36.4 33.1* 36.5  MCV 83.9 84.4 85.5  PLT 235 203 183    Cardiac Enzymes: No results for input(s): CKTOTAL, CKMB, CKMBINDEX, TROPONINI in the last 168 hours.  Lipid Panel: No results for input(s): CHOL, TRIG, HDL, CHOLHDL, VLDL, LDLCALC in the last 168 hours.  CBG:  Recent Labs Lab 11/29/16 1611 11/29/16 1946  11/29/16 2344 11/30/16 0535 11/30/16 0807  GLUCAP 151* 137* 93 129* 127*    Microbiology: Results for orders placed or performed during the hospital encounter of 11/28/16  Culture, blood (routine x 2)     Status: None (Preliminary result)   Collection Time: 11/28/16  8:15 PM  Result Value Ref Range Status   Specimen Description BLOOD RIGHT FOREARM  Final   Special Requests   Final    BOTTLES DRAWN AEROBIC AND ANAEROBIC Blood Culture adequate volume   Culture  Setup Time   Final    GRAM POSITIVE COCCI IN CLUSTERS ANAEROBIC BOTTLE ONLY Organism ID to follow CRITICAL RESULT CALLED TO, READ BACK BY AND VERIFIED WITHLavell Luster [facility] 2224 11/29/16 A BROWNING Performed at [facility], [place] [address], [place], [place] [address]    Culture GRAM POSITIVE COCCI  Final   Report Status PENDING  Incomplete  Urine culture     Status: None   Collection Time: 11/28/16  8:15 PM  Result Value Ref Range Status   Specimen Description URINE, CLEAN CATCH  Final   Special Requests Normal  Final   Culture   Final    NO GROWTH Performed at [facility], [place] [address], [place],  [address]    Report Status 11/30/2016 FINAL  Final  Blood Culture ID Panel (Reflexed)     Status: Abnormal   Collection Time: 11/28/16  8:15 PM  Result Value Ref Range Status   Enterococcus species NOT DETECTED NOT DETECTED Final   Listeria monocytogenes NOT DETECTED NOT DETECTED Final   Staphylococcus species DETECTED (A) NOT DETECTED Final    Comment: Methicillin (oxacillin) susceptible coagulase negative staphylococcus. Possible blood culture contaminant (unless isolated from more than one blood culture draw or clinical case suggests pathogenicity). No antibiotic treatment is indicated for blood  culture contaminants. CRITICAL RESULT CALLED TO, READ BACK BY AND VERIFIED WITH: Lavell Luster PHARMD 2224 11/29/16 A BROWNING    Staphylococcus aureus NOT DETECTED NOT DETECTED Final   Methicillin resistance  NOT DETECTED NOT DETECTED Final   Streptococcus species NOT DETECTED NOT DETECTED Final   Streptococcus agalactiae NOT DETECTED NOT DETECTED Final   Streptococcus pneumoniae NOT DETECTED NOT DETECTED Final   Streptococcus pyogenes NOT DETECTED NOT DETECTED Final   Acinetobacter baumannii NOT DETECTED NOT DETECTED Final   Enterobacteriaceae species NOT DETECTED NOT DETECTED Final   Enterobacter cloacae complex NOT DETECTED NOT DETECTED Final   Escherichia coli NOT DETECTED NOT DETECTED Final   Klebsiella oxytoca NOT DETECTED NOT DETECTED Final   Klebsiella pneumoniae NOT DETECTED NOT DETECTED Final   Proteus species NOT DETECTED NOT DETECTED Final   Serratia marcescens NOT DETECTED NOT DETECTED Final   Haemophilus influenzae NOT DETECTED NOT DETECTED Final   Neisseria meningitidis NOT DETECTED NOT DETECTED Final   Pseudomonas aeruginosa NOT DETECTED NOT DETECTED Final   Candida albicans NOT DETECTED NOT DETECTED Final  Candida glabrata NOT DETECTED NOT DETECTED Final   Candida krusei NOT DETECTED NOT DETECTED Final   Candida parapsilosis NOT DETECTED NOT DETECTED Final   Candida tropicalis NOT DETECTED NOT DETECTED Final    Comment: Performed at [facility], [place] [address], [place], [place] [address]  MRSA PCR Screening     Status: None   Collection Time: 11/29/16  4:14 AM  Result Value Ref Range Status   MRSA by PCR NEGATIVE NEGATIVE Final    Comment:        The GeneXpert MRSA Assay (FDA approved for NASAL specimens only), is one component of a comprehensive MRSA colonization surveillance program. It is not intended to diagnose MRSA infection nor to guide or monitor treatment for MRSA infections.     Coagulation Studies: No results for input(s): LABPROT, INR in the last 72 hours.  Imaging: Dg Chest 2 View  Result Date: 11/28/2016 CLINICAL DATA:  Confusion EXAM: CHEST  2 VIEW COMPARISON:  10/28/2016, 10/25/2016 FINDINGS: Cervical postsurgical changes. No acute  consolidation or effusion. Partially visualized posterior spinal rods and screws in the thoracolumbar spine. Normal cardiomediastinal silhouette. No pneumothorax. IMPRESSION: No acute infiltrate or edema Electronically Signed   By: Donavan Foil M.D.   On: 11/28/2016 21:15   Dg Lumbar Spine Complete  Result Date: 11/28/2016 CLINICAL DATA:  History of back surgery EXAM: LUMBAR SPINE - COMPLETE 4+ VIEW COMPARISON:  10/28/2016 FINDINGS: Surgical clips in the right upper quadrant. SI joints are symmetric. Calcified pelvic phleboliths. Status post posterior stabilization rod and fixating screws from T10 through L5 with interbody device at L3-L4 and L4-L5. Similar appearance of the hardware. The vertebral body heights are grossly maintained. IMPRESSION: Postsurgical changes of the thoracolumbar spine. No definite acute osseous abnormality. Electronically Signed   By: Donavan Foil M.D.   On: 11/28/2016 21:18   Ct Head Wo Contrast  Result Date: 11/28/2016 CLINICAL DATA:  Confusion and hallucinations. EXAM: CT HEAD WITHOUT CONTRAST TECHNIQUE: Contiguous axial images were obtained from the base of the skull through the vertex without intravenous contrast. COMPARISON:  01/07/2016 FINDINGS: Brain: No evidence of acute infarction, hemorrhage, hydrocephalus, extra-axial collection or mass lesion/mass effect. Again seen is a small area of encephalomalacia in the right temporal lobe. Moderate brain parenchymal volume loss. Vascular: No hyperdense vessel or unexpected calcification. Skull: Probable postsurgical changes from prior right temporal craniotomy, stable. Sinuses/Orbits: No acute finding. Other: None. IMPRESSION: No acute intracranial abnormality. Mild brain parenchymal volume loss. Stable changes from probable right temporal craniotomy with area of cortical encephalomalacia. Electronically Signed   By: Fidela Salisbury M.D.   On: 11/28/2016 22:00   Ct Lumbar Spine Wo Contrast  Result Date: 11/29/2016 CLINICAL  DATA:  Initial evaluation for altered mental status, recent lumbar surgery. EXAM: CT LUMBAR SPINE WITHOUT CONTRAST TECHNIQUE: Multidetector CT imaging of the lumbar spine was performed without intravenous contrast administration. Multiplanar CT image reconstructions were also generated. COMPARISON:  Prior radiograph from 11/28/2016. FINDINGS: Segmentation: Normal segmentation. Lowest well-formed disc is labeled the L5-S1 level. Alignment: Chronic trace anterolisthesis of L4 on L5, stable. Previously seen retrolisthesis of L3 on L4 no longer clearly seen. Trace retrolisthesis of L1 on L2. Vertebrae: Patient is status post extensive thoracolumbar fusion at T10 through L5. Bilateral transpedicular screws with interlocking rods in place at these levels. Interbody implants in place at L3-4 and L4-5. Solid osseous fusion at the L4-5 level. Patient is status post wide posterior decompression at L3-4. No evidence for hardware complication. Vertebral body heights  maintained. No acute osseous abnormality identified. No discrete or worrisome osseous lesion. Visualized sacrum intact. SI joints symmetric and within normal limits. Paraspinal and other soft tissues: Postoperative changes seen within the paraspinous soft tissues of the lower back. No acute soft tissue abnormality identified. Curvilinear soft tissue calcifications within the posterior paraspinous musculature extending from T11 through L2-3 likely postoperative in nature. No significant soft tissue swelling or inflammatory stranding to suggest acute infection or inflammation on this noncontrast examination. Moderate aortic atherosclerosis noted. Visualized visceral structures otherwise unremarkable. Partially visualized lung bases are clear. Disc levels: Degenerative disc bulge with moderate facet arthropathy noted at L5-S1, with resultant mild to moderate bilateral L5 foraminal narrowing. No other significant stenosis identified within the visualized thoracolumbar  spine. Please note evaluation limited by extensive streak artifact from fusion hardware. IMPRESSION: 1. Postoperative changes from prior removed thoracolumbar fusion at T10 through L5 without complication. 2. Postoperative changes within the paraspinous soft tissues as above. No significant swelling or inflammatory changes identified to suggest infection or other acute abnormality on this noncontrast examination. 3. Moderate aortic atherosclerosis. Electronically Signed   By: Jeannine Boga M.D.   On: 11/29/2016 01:26       Assessment and plan per attending neurologist  [name] PA-C [facility] [phone]  11/30/2016, 10:07 AM   Assessment/Plan:  This is 71 year old female with recent back surgery with now fairly acute onset of altered mental status. Workup thus far has not shown any clear etiology. Currently she is on an  Antibiotics for meningitis however, even on admission patient did not have a fever or elevated white blood cell count. Currently LP is not able to be obtained secondary to her recent surgery of her lower back. Given patient's continued altered mental status with no clear etiology would recommend patient being transferred to [facility] for further evaluation can be obtained in addition at this point would consider interventional radiology doing a cervical spine lumbar puncture to evaluate CSF at [facility]. MRI has been unable to be obtained secondary to patient's agitation and movement. Patient will need to be transferred to [facility] for a sedated MRI. We will continue to follow the patient once transferred to [facility]. Again from our neurological standpoint we highly recommend patient being transferred to [facility].

## 2016-11-30 NOTE — Progress Notes (Signed)
IV infiltration while acyclovir was infusing. Infusion stopped, IV d/c'd, pharmacy and on call NP notified.

## 2016-11-30 NOTE — Progress Notes (Signed)
Went to check to see if patient wanted to wear CPAP for tonight, pt was already asleep. Pt woke and was asked but stated she did not want it and wanted to go back to sleep. Vitals are stable SPO2 100% and RR 19. RT will continue to monitor.

## 2016-11-30 NOTE — Progress Notes (Signed)
Pharmacy: Renal dose adjustment for medications  Patient's a 71 y.o F presented to the ED on 11/28/16 with AMS.  She's currently on abx for r/o meningitis.  Patient's scr on admission was 1.95 but decreased to 0.97 this morning (CrCl 48 ml/min) with IV hydration.  Scr value about a yr ago was 0.95. Pharmacy is consulted to adjust medications for renal funct.  Plan: - continue acyclovir, ampicilin, and ceftriaxone at current doses - change vancomycin to 500mg  IV q12h starting with 6/7 AM dose as already received 1g dose this AM - no renal adjustment need for the other oral medications - monitor renal function  Peggyann Juba, PharmD, BCPS Pager: [phone] 11/30/2016 10:28 AM

## 2016-11-30 NOTE — NC FL2 (Signed)
[place] LEVEL OF CARE SCREENING TOOL     IDENTIFICATION  Patient Name: Sheryl [name] Birthdate: [birth year] Sex: female Admission Date (Current Location): 11/28/2016  [facility] and [place] Number:  Herbalist and Address:  [facility] [place],  [place], [place]      Provider Number: [id number]  Attending Physician Name and Address:  Eugenie Filler, MD  Relative Name and Phone Number:       Current Level of Care: Hospital Recommended Level of Care: [facility] Prior Approval Number:    Date Approved/Denied:   PASRR Number: [id number]  Discharge Plan: SNF    Current Diagnoses: Patient Active Problem List   Diagnosis Date Noted  . Acute encephalopathy 11/29/2016  . Sinusitis nasal 06/24/2016  . History of IBS 10/31/2014  . 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 Back pain 03/26/2014  . Preventive measure 03/26/2014  . Spinal stenosis 04/17/2012  . 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 Chronic lymphocytic leukemia ([place]) 12/14/2006  . Dyslipidemia 12/14/2006  . EXOGENOUS OBESITY 12/14/2006  . Essential hypertension 12/14/2006  . Osteoarthritis 12/14/2006  . FIBROMYALGIA 12/14/2006    Orientation RESPIRATION BLADDER Height & Weight     Self  O2 Incontinent Weight: 153 lb 10.6 oz (69.7 kg) Height:  5' (152.4 cm)  BEHAVIORAL SYMPTOMS/MOOD NEUROLOGICAL BOWEL NUTRITION STATUS  Other (Comment) (no behaviors)   Incontinent Diet  AMBULATORY STATUS COMMUNICATION OF NEEDS Skin   Extensive Assist Verbally Surgical wounds                       Personal Care Assistance Level of Assistance  Bathing, Feeding, Dressing Bathing Assistance: Maximum assistance Feeding assistance: Limited assistance Dressing Assistance: Maximum assistance     Functional Limitations Info  Sight, Hearing, Speech Sight Info: Adequate Hearing Info:  Adequate Speech Info: Adequate    SPECIAL CARE FACTORS FREQUENCY                       Contractures Contractures Info: Not present    Additional Factors Info  Code Status, Psychotropic, Allergies Code Status Info: Full Code Allergies Info: LATEX, ADHESIVE TAPE Psychotropic Info: zyprexa, prozac         Current Medications (11/30/2016):  This is the current hospital active medication list Current Facility-Administered Medications  Medication Dose Route Frequency Provider Last Rate Last Dose  . 0.9 %  sodium chloride infusion   Intravenous Continuous Eugenie Filler, MD 125 mL/hr at 11/30/16 0900    . acetaminophen (TYLENOL) tablet 650 mg  650 mg Oral Q6H PRN Rise Patience, MD       Or  . acetaminophen (TYLENOL) suppository 650 mg  650 mg Rectal Q6H PRN Rise Patience, MD      . acyclovir (ZOVIRAX) 700 mg in dextrose 5 % 100 mL IVPB  700 mg Intravenous Q12H Rise Patience, MD   Stopped at 11/30/16 0000  . ampicillin (OMNIPEN) 2 g in sodium chloride 0.9 % 50 mL IVPB  2 g Intravenous Q6H Rise Patience, MD 150 mL/hr at 11/30/16 0547 2 g at 11/30/16 0547  . cefTRIAXone (ROCEPHIN) 2 g in dextrose 5 % 50 mL IVPB  2 g Intravenous BID Rise Patience, MD   Stopped at 11/30/16 0930  . chlorhexidine (PERIDEX) 0.12 % solution 15 mL  15 mL Mouth Rinse BID Rise Patience, MD  15 mL at 11/29/16 1222  . cyanocobalamin ((VITAMIN B-12)) injection 1,000 mcg  1,000 mcg Subcutaneous Daily Eugenie Filler, MD      . dronabinol (MARINOL) capsule 2.5 mg  2.5 mg Oral BID AC Rizwan, Eunice Blase, MD      . FLUoxetine (PROZAC) capsule 20 mg  20 mg Oral Daily Rizwan, Eunice Blase, MD      . haloperidol (HALDOL) 2 MG/ML solution 2 mg  2 mg Oral Q4H PRN Debbe Odea, MD   2 mg at 11/29/16 1226  . hydrALAZINE (APRESOLINE) injection 10 mg  10 mg Intravenous Q4H PRN Rise Patience, MD      . LORazepam (ATIVAN) injection 1 mg  1 mg Intravenous Q6H PRN Debbe Odea, MD       . MEDLINE mouth rinse  15 mL Mouth Rinse q12n4p Rise Patience, MD   15 mL at 11/29/16 1600  . OLANZapine (ZYPREXA) tablet 2.5 mg  2.5 mg Oral QHS Rizwan, Saima, MD      . ondansetron (ZOFRAN) tablet 4 mg  4 mg Oral Q6H PRN Rise Patience, MD       Or  . ondansetron [facility]) injection 4 mg  4 mg Intravenous Q6H PRN Rise Patience, MD      . predniSONE (DELTASONE) tablet 2.5 mg  2.5 mg Oral Daily Debbe Odea, MD   2.5 mg at 11/29/16 1223  . vancomycin (VANCOCIN) IVPB 1000 mg/200 mL premix  1,000 mg Intravenous Q24H Rise Patience, MD 200 mL/hr at 11/30/16 1000 1,000 mg at 11/30/16 1000     Discharge Medications: Please see discharge summary for a list of discharge medications.  Relevant Imaging Results:  Relevant Lab Results:   Additional Information ss # [id number]  Georgetta Crafton, Randall An, LCSW

## 2016-12-01 ENCOUNTER — Encounter (HOSPITAL_COMMUNITY)
Admission: EM | Disposition: A | Discharge status: Skilled Nursing Facility | Payer: Self-pay | Source: Home / Self Care | Attending: Internal Medicine

## 2016-12-01 ENCOUNTER — Encounter (HOSPITAL_COMMUNITY): Payer: Self-pay | Admitting: Certified Registered"

## 2016-12-01 ENCOUNTER — Inpatient Hospital Stay (HOSPITAL_COMMUNITY): Payer: Medicare Other

## 2016-12-01 DIAGNOSIS — G9340 Encephalopathy, unspecified: Principal | ICD-10-CM

## 2016-12-01 LAB — GLUCOSE, CAPILLARY
Glucose-Capillary: 137 mg/dL — ABNORMAL HIGH (ref 65–99)
Glucose-Capillary: 152 mg/dL — ABNORMAL HIGH (ref 65–99)
Glucose-Capillary: 152 mg/dL — ABNORMAL HIGH (ref 65–99)
Glucose-Capillary: 119 mg/dL — ABNORMAL HIGH (ref 65–99)
Glucose-Capillary: 119 mg/dL — ABNORMAL HIGH (ref 65–99)
Glucose-Capillary: 118 mg/dL — ABNORMAL HIGH (ref 65–99)
Glucose-Capillary: 97 mg/dL (ref 65–99)

## 2016-12-01 LAB — BASIC METABOLIC PANEL
Anion gap: 8 (ref 5–15)
BUN: 24 mg/dL — ABNORMAL HIGH (ref 6–20)
CO2: 21 mmol/L — ABNORMAL LOW (ref 22–32)
Calcium: 8.4 mg/dL — ABNORMAL LOW (ref 8.9–10.3)
Chloride: 108 mmol/L (ref 101–111)
Creatinine, Ser: 1.01 mg/dL — ABNORMAL HIGH (ref 0.44–1.00)
GFR calc Af Amer: >60 mL/min (ref >60)
GFR calc non Af Amer: 56 mL/min — ABNORMAL LOW (ref >60)
Glucose, Bld: 112 mg/dL — ABNORMAL HIGH (ref 65–99)
Potassium: 3.8 mmol/L (ref 3.5–5.1)
Sodium: 137 mmol/L (ref 135–145)

## 2016-12-01 LAB — CBC WITH DIFFERENTIAL/PLATELET
Basophils Absolute: 0 10*3/uL (ref 0.0–0.1)
Basophils Relative: 0 %
Eosinophils Absolute: 0 10*3/uL (ref 0.0–0.7)
Eosinophils Relative: 0 %
HCT: 31.5 % — ABNORMAL LOW (ref 36.0–46.0)
Hemoglobin: 9.7 g/dL — ABNORMAL LOW (ref 12.0–15.0)
Lymphocytes Relative: 10 %
Lymphs Abs: 0.9 10*3/uL (ref 0.7–4.0)
MCH: 26.6 pg (ref 26.0–34.0)
MCHC: 30.8 g/dL (ref 30.0–36.0)
MCV: 86.5 fL (ref 78.0–100.0)
Monocytes Absolute: 0.4 10*3/uL (ref 0.1–1.0)
Monocytes Relative: 4 %
Neutro Abs: 7.4 10*3/uL (ref 1.7–7.7)
Neutrophils Relative %: 86 %
Platelets: 198 10*3/uL (ref 150–400)
RBC: 3.64 MIL/uL — ABNORMAL LOW (ref 3.87–5.11)
RDW: 15 % (ref 11.5–15.5)
WBC: 8.6 10*3/uL (ref 4.0–10.5)

## 2016-12-01 LAB — CULTURE, BLOOD (ROUTINE X 2): Special Requests: ADEQUATE

## 2016-12-01 LAB — MAGNESIUM: Magnesium: 2 mg/dL (ref 1.7–2.4)

## 2016-12-01 SURGERY — RADIOLOGY WITH ANESTHESIA
Anesthesia: General

## 2016-12-01 MED ORDER — PANTOPRAZOLE SODIUM 40 MG PO TBEC
40.0000 mg | DELAYED_RELEASE_TABLET | Freq: Every day | ORAL | Status: DC
  Administered 2016-12-02 – 2016-12-05 (×4): 40 mg via ORAL
  Filled 2016-12-01 (×5): qty 1

## 2016-12-01 MED ORDER — POLYETHYLENE GLYCOL 3350 17 G PO PACK
17.0000 g | PACK | Freq: Every day | ORAL | Status: DC
  Administered 2016-12-02 – 2016-12-05 (×2): 17 g via ORAL
  Filled 2016-12-01 (×5): qty 1

## 2016-12-01 MED ORDER — AMLODIPINE BESYLATE 10 MG PO TABS
10.0000 mg | ORAL_TABLET | Freq: Every day | ORAL | Status: DC
  Administered 2016-12-02 – 2016-12-03 (×2): 10 mg via ORAL
  Filled 2016-12-01 (×3): qty 1

## 2016-12-01 MED ORDER — BRIMONIDINE TARTRATE 0.15 % OP SOLN
1.0000 [drp] | Freq: Three times a day (TID) | OPHTHALMIC | Status: DC
  Administered 2016-12-01 – 2016-12-05 (×11): 1 [drp] via OPHTHALMIC
  Filled 2016-12-01: qty 5

## 2016-12-01 MED ORDER — HALOPERIDOL LACTATE 2 MG/ML PO CONC
2.0000 mg | Freq: Three times a day (TID) | ORAL | Status: DC | PRN
  Administered 2016-12-04: 2 mg via ORAL
  Filled 2016-12-01 (×3): qty 1

## 2016-12-01 MED ORDER — SODIUM CHLORIDE 0.9% FLUSH
10.0000 mL | Freq: Two times a day (BID) | INTRAVENOUS | Status: DC
  Administered 2016-12-01 – 2016-12-04 (×7): 10 mL

## 2016-12-01 MED ORDER — DOCUSATE SODIUM 100 MG PO CAPS
100.0000 mg | ORAL_CAPSULE | Freq: Two times a day (BID) | ORAL | Status: DC
  Administered 2016-12-01 – 2016-12-05 (×8): 100 mg via ORAL
  Filled 2016-12-01 (×8): qty 1

## 2016-12-01 MED ORDER — CHLORHEXIDINE GLUCONATE CLOTH 2 % EX PADS
6.0000 | MEDICATED_PAD | Freq: Every day | CUTANEOUS | Status: DC
  Administered 2016-12-01 – 2016-12-03 (×3): 6 via TOPICAL

## 2016-12-01 MED ORDER — SODIUM CHLORIDE 0.9% FLUSH
10.0000 mL | INTRAVENOUS | Status: DC | PRN
  Administered 2016-12-01: 10 mL
  Filled 2016-12-01: qty 40

## 2016-12-01 MED ORDER — OXYBUTYNIN CHLORIDE ER 10 MG PO TB24
10.0000 mg | ORAL_TABLET | Freq: Every day | ORAL | Status: DC
  Administered 2016-12-01 – 2016-12-04 (×4): 10 mg via ORAL
  Filled 2016-12-01 (×6): qty 1

## 2016-12-01 MED ORDER — ASPIRIN EC 81 MG PO TBEC
81.0000 mg | DELAYED_RELEASE_TABLET | Freq: Every day | ORAL | Status: DC
  Administered 2016-12-02 – 2016-12-05 (×4): 81 mg via ORAL
  Filled 2016-12-01 (×5): qty 1

## 2016-12-01 NOTE — Progress Notes (Signed)
Patient family, brother in law and niece, visited patient today. Per family, and patient, the patient is blind in right eye and deaf in left ear.  She also is hearing impaired in right ear and 30% blind in left eye.

## 2016-12-01 NOTE — Progress Notes (Signed)
Pt's spouse has provided permission for health care team to speak with pt's brother in-law, Raelie Lohr [phone] ) regarding pt's medical care.   Werner Lean LCSW [phone]

## 2016-12-01 NOTE — Progress Notes (Signed)
[facility] TEAM 1 - Stepdown/ICU Sheryl [name]  KNL:[id number] DOB: 05/15/1946 DOA: 11/28/2016 PCP: Marletta Lor, MD    Brief Narrative:  71 y.o.femalewith history of neurosarcoidosis presently off medications, CLL, fibromyalgia, and hypertension who had low back surgery at [facility] in May 2018 and was brought to [facility] ED from her SNF w/ 24hrs of confusion/AMS.  In the ER periodic episodes of confusion with agitation were noted. The only complaint the patient had was mid back pain around the shoulders. Neck rigidity was noted on exam. The scar over the recent surgery had no active discharge. Lumbar CT showed no definite infection.  The patient required transferred to [facility] 6/6 to allow for MRI with general anesthesia.  Subjective: The patient is significantly sedate.  She will awake with tactile stimulus but only very briefly and then quickly becomes unresponsive.  She does not appear to be in any respiratory distress.  There is no evidence of uncontrolled pain.  Assessment & Plan:  acute v/s subacute encephalopathy UA/urine cultures negative - chest x-ray unrevealing - CT head negative for acute abnormality - LP not accomplished due to recent lumbar surgery - extensive review of recent history performed by neurologist suggest this is not a new problem and has been present since early May 2018 - limit all sedating medications - follow-up MRI - consider discontinuing empiric antibiotic therapy as meningitis felt to be less likely in setting of afebrile status with normal white blood cell count - EEG pending  Coag neg Staph + blood cx Appears only 1 blood culture was obtained - too early to know if this is a contaminant or an actual pathogen - adequately covered with current antibiotic  Hypernatremia Likely due to IV fluid resuscitation - change to hypotonic fluid - follow-up   Acute kidney injury Baseline creatinine 0.9 - has nearly resolved with volume  resuscitation  Recent Labs Lab 11/28/16 2021 11/29/16 0530 11/30/16 0841  CREATININE 1.95* 1.66* 0.97   HTN Blood pressure elevated due to holding of oral medications - adjust treatment and follow  HLD Continue statin as able  low normal B-12 levels Continue B-12 supplementation  Hypomagnesemia Corrected into a normal range   Hypokalemia Corrected into a normal range  Neurosarcoidosis F/U as outpatient   Chronic L ear deafness  Chronic blindness R eye    DVT prophylaxis: SCDs Code Status: FULL CODE Family Communication: no family present at time of exam  Disposition Plan: SDU  Consultants:  Neurology   Procedures: none  Antimicrobials:  Acyclovir 6/5 > Ampicillin 6/5 > Rocephin 6/5 > Vancomycin 6/5 >  Objective: Blood pressure (!) 169/69, pulse (!) 54, temperature 97.5 F (36.4 C), resp. rate 16, height 5' (1.524 m), weight 69.7 kg (153 lb 10.6 oz), SpO2 100 %.  Intake/Output Summary (Last 24 hours) at 12/01/16 1126 Last data filed at 12/01/16 0600  Gross per 24 hour  Intake             2474 ml  Output              100 ml  Net             2374 ml   Filed Weights   11/28/16 2340 11/29/16 0308  Weight: 81.6 kg (180 lb) 69.7 kg (153 lb 10.6 oz)    Examination: General: No acute respiratory distress - sedate but able to be awoken with tactile stimulus  Lungs: Clear to auscultation bilaterally without wheezes or crackles  Cardiovascular: Regular rate and rhythm without murmur gallop or rub normal S1 and S2 Abdomen: Nontender, nondistended, soft, bowel sounds positive, no rebound, no ascites, no appreciable mass Extremities: No significant cyanosis, clubbing, or edema bilateral lower extremities  CBC:  Recent Labs Lab 11/28/16 2021 11/29/16 0530 11/30/16 0841  WBC 7.9 9.0 8.3  NEUTROABS 5.8 7.4 6.5  HGB 11.9* 10.4* 11.8*  HCT 36.4 33.1* 36.5  MCV 83.9 84.4 85.5  PLT 235 203 585   Basic Metabolic Panel:  Recent Labs Lab  11/28/16 2021 11/29/16 0530 11/30/16 0841  NA 143 143 147*  K 4.7 4.3 4.1  CL 112* 112* 119*  CO2 18* 20* 22  GLUCOSE 112* 135* 146*  BUN 51* 44* 21*  CREATININE 1.95* 1.66* 0.97  CALCIUM 9.5 9.1 8.9  MG  --  1.5* 1.8   GFR: Estimated Creatinine Clearance: 48.4 mL/min (by C-G formula based on SCr of 0.97 mg/dL).  Liver Function Tests:  Recent Labs Lab 11/28/16 2021 11/29/16 0530  AST 34 32  ALT 20 20  ALKPHOS 122 110  BILITOT 0.6 0.6  PROT 6.2* 6.0*  ALBUMIN 3.8 3.4*    Recent Labs Lab 11/29/16 0530  AMMONIA 12    HbA1C: Hgb A1c MFr Bld  Date/Time Value Ref Range Status  04/17/2012 10:36 AM 5.7 4.6 - 6.5 % Final    Comment:    Glycemic Control Guidelines for People with Diabetes:Non Diabetic:  <6%Goal of Therapy: <7%Additional Action Suggested:  >8%   01/04/2012 10:49 AM 6.4 4.6 - 6.5 % Final    Comment:    Glycemic Control Guidelines for People with Diabetes:Non Diabetic:  <6%Goal of Therapy: <7%Additional Action Suggested:  >8%     CBG:  Recent Labs Lab 11/30/16 1543 11/30/16 2024 11/30/16 2331 12/01/16 0336 12/01/16 0831  GLUCAP 137* 152* 141* 152* 119*    Recent Results (from the past 240 hour(s))  Culture, blood (routine x 2)     Status: Abnormal   Collection Time: 11/28/16  8:15 PM  Result Value Ref Range Status   Specimen Description BLOOD RIGHT FOREARM  Final   Special Requests   Final    BOTTLES DRAWN AEROBIC AND ANAEROBIC Blood Culture adequate volume   Culture  Setup Time   Final    GRAM POSITIVE COCCI IN CLUSTERS ANAEROBIC BOTTLE ONLY CRITICAL RESULT CALLED TO, READ BACK BY AND VERIFIED WITH: Lavell Luster PHARMD 2224 11/29/16 A BROWNING    Culture (A)  Final    STAPHYLOCOCCUS SPECIES (COAGULASE NEGATIVE) THE SIGNIFICANCE OF ISOLATING THIS ORGANISM FROM A SINGLE SET OF BLOOD CULTURES WHEN MULTIPLE SETS ARE DRAWN IS UNCERTAIN. PLEASE NOTIFY THE MICROBIOLOGY DEPARTMENT WITHIN ONE WEEK IF SPECIATION AND SENSITIVITIES ARE  REQUIRED. Performed at [facility], [place] [address] [place], [place] [address]    Report Status 12/01/2016 FINAL  Final  Urine culture     Status: None   Collection Time: 11/28/16  8:15 PM  Result Value Ref Range Status   Specimen Description URINE, CLEAN CATCH  Final   Special Requests Normal  Final   Culture   Final    NO GROWTH Performed at [facility], [place] [address] [place], [place] [address]    Report Status 11/30/2016 FINAL  Final  Blood Culture ID Panel (Reflexed)     Status: Abnormal   Collection Time: 11/28/16  8:15 PM  Result Value Ref Range Status   Enterococcus species NOT DETECTED NOT DETECTED Final   Listeria monocytogenes NOT  DETECTED NOT DETECTED Final   Staphylococcus species DETECTED (A) NOT DETECTED Final    Comment: Methicillin (oxacillin) susceptible coagulase negative staphylococcus. Possible blood culture contaminant (unless isolated from more than one blood culture draw or clinical case suggests pathogenicity). No antibiotic treatment is indicated for blood  culture contaminants. CRITICAL RESULT CALLED TO, READ BACK BY AND VERIFIED WITH: Lavell Luster PHARMD 2224 11/29/16 A BROWNING    Staphylococcus aureus NOT DETECTED NOT DETECTED Final   Methicillin resistance NOT DETECTED NOT DETECTED Final   Streptococcus species NOT DETECTED NOT DETECTED Final   Streptococcus agalactiae NOT DETECTED NOT DETECTED Final   Streptococcus pneumoniae NOT DETECTED NOT DETECTED Final   Streptococcus pyogenes NOT DETECTED NOT DETECTED Final   Acinetobacter baumannii NOT DETECTED NOT DETECTED Final   Enterobacteriaceae species NOT DETECTED NOT DETECTED Final   Enterobacter cloacae complex NOT DETECTED NOT DETECTED Final   Escherichia coli NOT DETECTED NOT DETECTED Final   Klebsiella oxytoca NOT DETECTED NOT DETECTED Final   Klebsiella pneumoniae NOT DETECTED NOT DETECTED Final   Proteus species NOT DETECTED NOT DETECTED Final   Serratia marcescens NOT DETECTED NOT  DETECTED Final   Haemophilus influenzae NOT DETECTED NOT DETECTED Final   Neisseria meningitidis NOT DETECTED NOT DETECTED Final   Pseudomonas aeruginosa NOT DETECTED NOT DETECTED Final   Candida albicans NOT DETECTED NOT DETECTED Final   Candida glabrata NOT DETECTED NOT DETECTED Final   Candida krusei NOT DETECTED NOT DETECTED Final   Candida parapsilosis NOT DETECTED NOT DETECTED Final   Candida tropicalis NOT DETECTED NOT DETECTED Final    Comment: Performed at [facility], [place] [address], [place], [place] [address]  MRSA PCR Screening     Status: None   Collection Time: 11/29/16  4:14 AM  Result Value Ref Range Status   MRSA by PCR NEGATIVE NEGATIVE Final    Comment:        The GeneXpert MRSA Assay (FDA approved for NASAL specimens only), is one component of a comprehensive MRSA colonization surveillance program. It is not intended to diagnose MRSA infection nor to guide or monitor treatment for MRSA infections.      Scheduled Meds: . chlorhexidine  15 mL Mouth Rinse BID  . cyanocobalamin  1,000 mcg Subcutaneous Daily  . dexamethasone  10 mg Intravenous Q6H  . dronabinol  2.5 mg Oral BID AC  . FLUoxetine  20 mg Oral Daily  . mouth rinse  15 mL Mouth Rinse q12n4p  . metoprolol tartrate  5 mg Intravenous Q8H  . OLANZapine  2.5 mg Oral QHS     LOS: 2 days   Cherene Altes, MD [facility] Office  [phone] Pager - Text Page per Amion as per below:  On-Call/Text Page:      [email or web address]      password [email or web address]  If 7PM-7AM, please contact night-coverage [email or web address] Password [email or web address] 12/01/2016, 11:26 AM

## 2016-12-01 NOTE — Care Management Note (Signed)
Case Management Note  Patient Details  Name: Sheryl [name] MRN: [id number] Date of Birth: [birth year]  Subjective/Objective:   Presents  with history of neurosarcoidosis, CLL, fibromyalgia, and hypertension who had low back surgery at [facility] in May 2018 and was brought to [facility] ED from her SNF w/ 24hrs of confusion/AMS.  PCP Bluford Kaufmann               Action/Plan: NCM will follow along with CSW for dc needs.  Expected Discharge Date:   (UNKNOWN)               Expected Discharge Plan:  Skilled Nursing Facility  In-House Referral:  Clinical Social Work  Discharge planning Services  CM Consult  Post Acute Care Choice:    Choice offered to:     DME Arranged:    DME Agency:     HH Arranged:    [place] Agency:     Status of Service:  Completed, signed off  If discussed at [employer], dates discussed:    Additional Comments:  Zenon Mayo, RN 12/01/2016, 6:02 PM

## 2016-12-01 NOTE — Progress Notes (Signed)
Neurology Progress Note  Subjective: No significant 24-hour events have been reported. She continues to have fluctuating levels of alertness. She was undergoing EEG when I arrived in her room this morning. Following the EEG, she rouses easily to voice. She is conversant and is able to follow all commands during my examination. She does have a tendency to keep her eyes closed throughout the exam. Presently, she is not reporting any pain. A 12 point review systems is unremarkable.  Medications reviewed and reconciled.   Pertinent meds: Acyclovir 700 mg IV every 12 hours Ampicillin 2 g IV every 6 hours Ceftriaxone 2 g IV twice daily Olanzapine 2.5 mg at bedtime Fluoxetine 20 mg daily  Current Meds:   Current Facility-Administered Medications:  .  0.45 % sodium chloride infusion, , Intravenous, Continuous, Cherene Altes, MD, Last Rate: 125 mL/hr at 12/01/16 0132 .  acetaminophen (TYLENOL) tablet 650 mg, 650 mg, Oral, Q6H PRN **OR** acetaminophen (TYLENOL) suppository 650 mg, 650 mg, Rectal, Q6H PRN, Rise Patience, MD .  acyclovir (ZOVIRAX) 700 mg in dextrose 5 % 100 mL IVPB, 700 mg, Intravenous, Q12H, Rise Patience, MD, Stopped at 12/01/16 0231 .  amLODipine (NORVASC) tablet 10 mg, 10 mg, Oral, Daily, McClung, Dellis Filbert T, MD .  ampicillin (OMNIPEN) 2 g in sodium chloride 0.9 % 50 mL IVPB, 2 g, Intravenous, Q6H, Eugenie Filler, MD, Stopped at 12/01/16 [phone] .  aspirin tablet 81 mg, 81 mg, Oral, Daily, McClung, Dellis Filbert T, MD .  brimonidine (ALPHAGAN) 0.15 % ophthalmic solution 1 drop, 1 drop, Left Eye, TID, Joette Catching T, MD .  cefTRIAXone (ROCEPHIN) 2 g in dextrose 5 % 50 mL IVPB, 2 g, Intravenous, BID, Rise Patience, MD, Stopped at 12/01/16 0147 .  chlorhexidine (PERIDEX) 0.12 % solution 15 mL, 15 mL, Mouth Rinse, BID, Rise Patience, MD, 15 mL at 12/01/16 0147 .  cyanocobalamin ((VITAMIN B-12)) injection 1,000 mcg, 1,000 mcg, Subcutaneous, Daily, Eugenie Filler, MD, 1,000 mcg at 11/30/16 1056 .  docusate sodium (COLACE) capsule 100 mg, 100 mg, Oral, BID, Cherene Altes, MD .  FLUoxetine (PROZAC) capsule 20 mg, 20 mg, Oral, Daily, Rizwan, Saima, MD, 20 mg at 11/30/16 1056 .  haloperidol (HALDOL) 2 MG/ML solution 2 mg, 2 mg, Oral, Q8H PRN, Cherene Altes, MD .  hydrALAZINE (APRESOLINE) injection 10 mg, 10 mg, Intravenous, Q4H PRN, Rise Patience, MD .  MEDLINE mouth rinse, 15 mL, Mouth Rinse, q12n4p, Rise Patience, MD, 15 mL at 11/30/16 1200 .  metoprolol tartrate (LOPRESSOR) injection 5 mg, 5 mg, Intravenous, Q8H, Eugenie Filler, MD, 5 mg at 12/01/16 0004 .  OLANZapine (ZYPREXA) tablet 2.5 mg, 2.5 mg, Oral, QHS, Rizwan, Saima, MD, 2.5 mg at 12/01/16 0141 .  ondansetron (ZOFRAN) tablet 4 mg, 4 mg, Oral, Q6H PRN **OR** ondansetron (ZOFRAN) injection 4 mg, 4 mg, Intravenous, Q6H PRN, Rise Patience, MD .  oxybutynin (DITROPAN-XL) 24 hr tablet 10 mg, 10 mg, Oral, QHS, Joette Catching T, MD .  pantoprazole (PROTONIX) EC tablet 40 mg, 40 mg, Oral, Daily, Joette Catching T, MD .  polyethylene glycol (MIRALAX / GLYCOLAX) packet 17 g, 17 g, Oral, Daily, McClung, Kimberlee Nearing, MD  Objective:  Temp:  [97.5 F (36.4 C)-98.5 F (36.9 C)] 97.5 F (36.4 C) (06/07 0300) Pulse Rate:  [44-90] 54 (06/07 0653) Resp:  [14-21] 16 (06/07 0620) BP: (102-174)/(40-86) 169/69 (06/07 0653) SpO2:  [87 %-100 %] 100 % (06/07 0620)  General: WD obese Caucasian  woman lying in bed in NAD. She appeared to be sleeping initially but arouses to voice. When alert, she is oriented to self, [place], June, 2018. She said that the date was the fourth. She knows she is in the hospital but could not, the name of hospital. Immediate recall is 3/3 items. Spontaneous recall at 3 minutes is 1/3 items and she is able to get a second item with recognition cues. She did not recall the third even with a combination of semantic and recognition cues. Sustain  attention is mildly impaired. Speech processing is mildly delayed. Speech is mildly dysarthric but this seems to be largely effort dependent because it becomes much more clear the more alert she is. No obvious aphasia.  HEENT: Neck is supple without lymphadenopathy. Mucous membranes are slightly dry and the oropharynx is clear. Sclerae are anicteric. There is no conjunctival injection.  CV: Regular, no murmur. Carotid pulses are 2+ and symmetric with no bruits. Distal pulses 2+ and symmetric.  Lungs: CTAB on anterior auscultation. Extremities: No C/C/E. Neuro: MS: As noted above.  CN: Pupils are equal and reactive from 3-->2 mm bilaterally. EOMI, no nystagmus. There is breakup of smooth pursuits. Facial sensation is intact to light touch. Face is symmetric at rest with normal strength and mobility. Hearing is intact to conversational voice. Voice is normal in tone and quality.Tongue is midline with normal bulk and mobility.  Motor: Normal bulk, tone. She has mild proximal weakness x4. Effort is variable with confrontational testing. No tremor or other abnormal movements are observed.  Sensation: Intact to light touch throughout.  DTRs: 3+, symmetric. Greater than 10 beats of clonus is present at both ankles. Hoffman's absent bilaterally. Toes are upgoing bilaterally.  Coordination/gait: These are deferred.   Labs: Lab Results  Component Value Date   WBC 8.3 11/30/2016   HGB 11.8 (L) 11/30/2016   HCT 36.5 11/30/2016   PLT 183 11/30/2016   GLUCOSE 146 (H) 11/30/2016   CHOL 242 (H) 04/17/2012   TRIG 204.0 (H) 04/17/2012   HDL 48.40 04/17/2012   LDLDIRECT 164.3 04/17/2012   ALT 20 11/29/2016   AST 32 11/29/2016   NA 147 (H) 11/30/2016   K 4.1 11/30/2016   CL 119 (H) 11/30/2016   CREATININE 0.97 11/30/2016   BUN 21 (H) 11/30/2016   CO2 22 11/30/2016   TSH 1.318 11/29/2016   HGBA1C 5.7 04/17/2012   CBC Latest Ref Rng & Units 11/30/2016 11/29/2016 11/28/2016  WBC 4.0 - 10.5 K/uL 8.3 9.0 7.9   Hemoglobin 12.0 - 15.0 g/dL 11.8(L) 10.4(L) 11.9(L)  Hematocrit 36.0 - 46.0 % 36.5 33.1(L) 36.4  Platelets 150 - 400 K/uL 183 203 235    Lab Results  Component Value Date   HGBA1C 5.7 04/17/2012   Lab Results  Component Value Date   ALT 20 11/29/2016   AST 32 11/29/2016   ALKPHOS 110 11/29/2016   BILITOT 0.6 11/29/2016    Radiology:  No new neuroimaging.   Other diagnostic studies:  EEG pending. Reviewed part of the study as it was being recorded. This shows mild to moderate diffuse slowing without epileptiform activity or seizure.   A/P:   1. Encephalopathy: It seems this is been present since early May. She continues to have fluctuations in her level of mentation. On my examination today, she actually appears to be very appropriate for the most part. She is disoriented to time and has some slowed speed of processing. It is unclear to me at this time, however,  what her precise cognitive baseline is. I suspect that her encephalopathy is largely related to medication effect and metabolic issues. I do not have a strong suspicion that her neurosarcoidosis is playing any role at this time but MRI scan of the brain has been ordered for further evaluation. There does not appear to be anything to support CNS infection so I will discontinue antimicrobial agents at this time. EEG did not show any evidence of seizure activity. Continue supportive care. Continue to hold CNS active medications, particularly benzos, opiates, and anything with strong anticholinergic properties. Continue to replete vitamin B-12. She may benefit from speech therapy consultation for more detailed cognitive evaluation.  2. Neurosarcoidosis: As noted above, my suspicion that this is playing an active role at this time is low. MRI scan of the brain with and without contrast has been ordered and is pending. If no evidence of acute disease, would favor returning her to her outpatient steroid regimen which is prednisone 2.5 mg  daily.   Melba Coon, MD [facility]

## 2016-12-01 NOTE — Progress Notes (Signed)
Dicussed with RN, patient has some AMS and doesn't seem alert enough to wear CPAP tonight. Patient on [place] and vitals are stable. RT will continue to monitor patient.

## 2016-12-01 NOTE — Procedures (Signed)
ELECTROENCEPHALOGRAM REPORT  Date of Study: 12/01/2016  Patient's Name: Sheryl [name] MRN: [id number] Date of Birth: 1946/03/08  Referring Provider: Melba Coon, MD  Clinical History: 71 year old woman with fever and altered mental status  Medications: acetaminophen (TYLENOL) suppository 650 mg  ampicillin (OMNIPEN) 2 g in sodium chloride 0.9 % 50 mL IVPB  cefTRIAXone (ROCEPHIN) 2 g in dextrose 5 % 50 mL IVPB  chlorhexidine (PERIDEX) 0.12 % solution 15 mL  cyanocobalamin ((VITAMIN B-12)) injection 1,000 mcg dexamethasone (DECADRON) injection 10 mg  dronabinol (MARINOL) capsule 2.5 mg  FLUoxetine (PROZAC) capsule 20 mg  haloperidol (HALDOL) 2 MG/ML solution 2 mg  hydrALAZINE (APRESOLINE) injection 10 mg  LORazepam (ATIVAN) injection 1 mg  metoprolol tartrate (LOPRESSOR) injection 5 mg  OLANZapine (ZYPREXA) tablet 2.5 mg  ondansetron (ZOFRAN) injection 4 mg  ondansetron (ZOFRAN) tablet 4 mg  vancomycin (VANCOCIN) 500 mg in sodium chloride 0.9 % 100 mL IVPB   Technical Summary: A multichannel digital EEG recording measured by the international 10-20 system with electrodes applied with paste and impedances below 5000 ohms performed as portable with EKG monitoring in an awake and drowsy patient.  Hyperventilation and photic stimulation were not performed.  The digital EEG was referentially recorded, reformatted, and digitally filtered in a variety of bipolar and referential montages for optimal display.   Description: The patient is awake and drowsy during the recording.  During maximal wakefulness, there is a symmetric, medium voltage 5 Hz posterior dominant rhythm that attenuates with eye opening. This is admixed with diffuse 4-5 Hz theta and 2-3 Hz delta slowing of the waking background.  Stage 2 sleep is not seen.  There were no epileptiform discharges or electrographic seizures seen.    EKG lead was unremarkable.  Impression: This awake and drowsy EEG is abnormal due to  diffuse slowing of the waking background.  Clinical Correlation of the above findings indicates diffuse cerebral dysfunction that is non-specific in etiology and can be seen with hypoxic/ischemic injury, toxic/metabolic encephalopathies, neurodegenerative disorders, or medication effect.  The absence of epileptiform discharges does not rule out a clinical diagnosis of epilepsy.  Clinical correlation is advised.   Metta Clines, DO

## 2016-12-01 NOTE — Progress Notes (Signed)
EEG completed, results pending. 

## 2016-12-01 NOTE — Progress Notes (Signed)
Peripherally Inserted Central Catheter/Midline Placement  The IV Nurse has discussed with the patient and/or persons authorized to consent for the patient, the purpose of this procedure and the potential benefits and risks involved with this procedure.  The benefits include less needle sticks, lab draws from the catheter, and the patient may be discharged home with the catheter. Risks include, but not limited to, infection, bleeding, blood clot (thrombus formation), and puncture of an artery; nerve damage and irregular heartbeat and possibility to perform a PICC exchange if needed/ordered by physician.  Alternatives to this procedure were also discussed.  Bard Power PICC patient education guide, fact sheet on infection prevention and patient information card has been provided to patient /or left at bedside.    PICC/Midline Placement Documentation  PICC Triple Lumen 12/01/16 PICC Left Brachial 43 cm 1 cm (Active)  Indication for Insertion or Continuance of Line Poor Vasculature-patient has had multiple peripheral attempts or PIVs lasting less than 24 hours 12/01/2016  1:00 PM  Exposed Catheter (cm) 1 cm 12/01/2016  1:00 PM  Dressing Change Due 12/08/16 12/01/2016  1:00 PM    Telephone consent   Jule Economy Horton 12/01/2016, 1:44 PM

## 2016-12-01 NOTE — Progress Notes (Addendum)
Paged [name], PA-C, regarding MRI with general anesthesia for sedation; patient was [facility] for [facility].   Contacted anesthesia department. Per Mateo Flow, needs reason for MRI and general anesthesia in H&P summary from primary MD or attending MD. There is reasoning for procedure on consult note, however, not on H&P summary.   Paged PA again to inform him of the outcome, who contacted Anesthesia and was informed the MRI would be completed.  RN contacted MRI to inform the department to schedule the anesthesia and MRI

## 2016-12-02 ENCOUNTER — Encounter (HOSPITAL_COMMUNITY)
Admission: EM | Disposition: A | Discharge status: Skilled Nursing Facility | Payer: Self-pay | Source: Home / Self Care | Attending: Internal Medicine

## 2016-12-02 ENCOUNTER — Encounter (HOSPITAL_COMMUNITY): Payer: Self-pay | Admitting: Anesthesiology

## 2016-12-02 ENCOUNTER — Inpatient Hospital Stay (HOSPITAL_COMMUNITY): Payer: Medicare Other

## 2016-12-02 DIAGNOSIS — E162 Hypoglycemia, unspecified: Secondary | ICD-10-CM

## 2016-12-02 DIAGNOSIS — R630 Anorexia: Secondary | ICD-10-CM

## 2016-12-02 LAB — PHOSPHORUS: Phosphorus: 2.8 mg/dL (ref 2.5–4.6)

## 2016-12-02 LAB — CBC
HCT: 33.3 % — ABNORMAL LOW (ref 36.0–46.0)
Hemoglobin: 10.2 g/dL — ABNORMAL LOW (ref 12.0–15.0)
MCH: 26.4 pg (ref 26.0–34.0)
MCHC: 30.6 g/dL (ref 30.0–36.0)
MCV: 86 fL (ref 78.0–100.0)
Platelets: 194 10*3/uL (ref 150–400)
RBC: 3.87 MIL/uL (ref 3.87–5.11)
RDW: 15.1 % (ref 11.5–15.5)
WBC: 8.5 10*3/uL (ref 4.0–10.5)

## 2016-12-02 LAB — GLUCOSE, CAPILLARY
Glucose-Capillary: 103 mg/dL — ABNORMAL HIGH (ref 65–99)
Glucose-Capillary: 107 mg/dL — ABNORMAL HIGH (ref 65–99)
Glucose-Capillary: 110 mg/dL — ABNORMAL HIGH (ref 65–99)
Glucose-Capillary: 54 mg/dL — ABNORMAL LOW (ref 65–99)
Glucose-Capillary: 61 mg/dL — ABNORMAL LOW (ref 65–99)
Glucose-Capillary: 68 mg/dL (ref 65–99)
Glucose-Capillary: 71 mg/dL (ref 65–99)
Glucose-Capillary: 94 mg/dL (ref 65–99)

## 2016-12-02 LAB — COMPREHENSIVE METABOLIC PANEL
ALT: 16 U/L (ref 14–54)
AST: 17 U/L (ref 15–41)
Albumin: 2.6 g/dL — ABNORMAL LOW (ref 3.5–5.0)
Alkaline Phosphatase: 91 U/L (ref 38–126)
Anion gap: 7 (ref 5–15)
BUN: 28 mg/dL — ABNORMAL HIGH (ref 6–20)
CO2: 24 mmol/L (ref 22–32)
Calcium: 8.9 mg/dL (ref 8.9–10.3)
Chloride: 109 mmol/L (ref 101–111)
Creatinine, Ser: 1.11 mg/dL — ABNORMAL HIGH (ref 0.44–1.00)
GFR calc Af Amer: 58 mL/min — ABNORMAL LOW (ref >60)
GFR calc non Af Amer: 50 mL/min — ABNORMAL LOW (ref >60)
Glucose, Bld: 96 mg/dL (ref 65–99)
Potassium: 3.6 mmol/L (ref 3.5–5.1)
Sodium: 140 mmol/L (ref 135–145)
Total Bilirubin: 0.4 mg/dL (ref 0.3–1.2)
Total Protein: 4.5 g/dL — ABNORMAL LOW (ref 6.5–8.1)

## 2016-12-02 LAB — MAGNESIUM: Magnesium: 2 mg/dL (ref 1.7–2.4)

## 2016-12-02 SURGERY — RADIOLOGY WITH ANESTHESIA
Anesthesia: General

## 2016-12-02 MED ORDER — DEXTROSE-NACL 5-0.45 % IV SOLN
INTRAVENOUS | Status: DC
Start: 2016-12-02 — End: 2016-12-03
  Administered 2016-12-02 – 2016-12-03 (×2): via INTRAVENOUS

## 2016-12-02 MED ORDER — DEXTROSE 50 % IV SOLN
INTRAVENOUS | Status: AC
  Filled 2016-12-02: qty 50

## 2016-12-02 MED ORDER — LACTATED RINGERS IV SOLN: INTRAVENOUS | Status: DC

## 2016-12-02 MED ORDER — DRONABINOL 2.5 MG PO CAPS
5.0000 mg | ORAL_CAPSULE | Freq: Two times a day (BID) | ORAL | Status: DC
  Administered 2016-12-02 – 2016-12-04 (×5): 5 mg via ORAL
  Filled 2016-12-02 (×6): qty 2

## 2016-12-02 MED ORDER — DEXTROSE 50 % IV SOLN
25.0000 g | Freq: Once | INTRAVENOUS | Status: AC
  Administered 2016-12-02: 25 g via INTRAVENOUS

## 2016-12-02 MED ORDER — LORAZEPAM 2 MG/ML IJ SOLN: 1.0000 mg | INTRAMUSCULAR | Status: DC | PRN

## 2016-12-02 MED ORDER — GADOBENATE DIMEGLUMINE 529 MG/ML IV SOLN
15.0000 mL | Freq: Once | INTRAVENOUS | Status: AC | PRN
  Administered 2016-12-02: 15 mL via INTRAVENOUS

## 2016-12-02 NOTE — Progress Notes (Signed)
PROGRESS NOTE    Sheryl [name]  AYT:[id number] DOB: [birth year] DOA: 11/28/2016 PCP: Marletta Lor, MD   Brief Narrative:  71 y.o.WF PMHx Neurosarcoidosis presently off medications, CLL, Fibromyalgia, HTN, HLD, IBS, DJD S/P low back surgery May 2018, OSA not on CPAP  Who had low back surgery at [facility] in May 2018 and was brought to [facility] ED from her SNF w/ 24hrs of confusion/AMS.  In the ER periodic episodes of confusion with agitation were noted. The only complaint the patient had was mid back pain around the shoulders. Neck rigidity was noted on exam. The scar over the recent surgery had no active discharge. Lumbar CT showed no definite infection.  The patient required transferred to [facility] 6/6 to allow for MRI with general anesthesia.    Subjective: 6/8 sleepy but arousable, A/O 4. Negative CP, negative SOB, negative abdominal pain.   Assessment & Plan:   Principal Problem:   Acute encephalopathy Active Problems:   Chronic lymphocytic leukemia (HCC)   Dyslipidemia   Essential hypertension   Neurosarcoidosis   Bacteremia due to Gram-positive bacteria: Preliminary   Hypernatremia   AKI (acute kidney injury) ([facility])   acute v/s subacute encephalopathy UA/urine cultures negative - chest x-ray unrevealing  - CT head negative for acute abnormality  - LP not accomplished due to recent lumbar surgery  - extensive review of recent history performed by neurologist suggest this is not a new problem and has been present since early May 2018 - limit all sedating medications  - Neurology canceled all MRI except for brain which was nondiagnostic. - EEG pending -Treatment per neurology  Coag neg Staph + blood cx Appears only 1 blood culture was obtained - too early to know if this is a contaminant or an actual pathogen - adequately covered with current antibiotic  Hypernatremia Likely due to IV fluid resuscitation - change to hypotonic fluid -  follow-up  Recent Labs     11/30/16  0841  12/01/16  1807  12/02/16  0445  NA  147*  137  140  -Resolved  Acute kidney injury(Baseline creatinine 0.9) Lab Results  Component Value Date   CREATININE 1.11 (H) 12/02/2016   CREATININE 1.01 (H) 12/01/2016   CREATININE 0.97 11/30/2016   - has nearly resolved with volume resuscitation  HTN Blood pressure elevated due to holding of oral medications - adjust treatment and follow  HLD Continue statin as able  low normal B-12 levels Continue B-12 supplementation  Hypomagnesemia Corrected into a normal range   Hypokalemia Corrected into a normal range  Neurosarcoidosis F/U as outpatient  Chronic L ear deafness  Chronic blindness R eye   Anorexia -Calorie count -Marinol 5 mg BID -Continue to encourage patient to consume meals.  Hypoglycemia -Start D5-0.45% saline at 50 ml/hr     DVT prophylaxis: SCDs Code Status: FULL CODE Family Communication: no family present at time of exam  Disposition Plan: SDU    Consultants:  Neurology     Procedures/Significant Events:  6/4 CT head Wo contrast:Brain: Negative acute infarction, hemorrhage, hydrocephalus, extra-axial collection or mass lesion/mass effect.  -small area of encephalomalacia in the right temporal lobe. Moderate brain parenchymal volume loss. -Probable postsurgical changes from prior right temporal craniotomy, stable. 6/5 CT L-spine Wo contrast:-Postoperative changes from prior removed thoracolumbar fusion at T10 through L5 without complication. -No significant swelling or inflammatory changes identified to suggest infection or other acute abnormality  6/8 MRI brain W/WO contrast:-Negative acute finding by brain MRI.  Atrophy and chronic small-vessel ischemic change  -Old right temporal craniotomy with lateral right temporal defect which could be postsurgical or related to old head trauma.      VENTILATOR  SETTINGS:    Cultures     Antimicrobials: Anti-infectives    Start     Stop   12/01/16 2100  ampicillin (OMNIPEN) 2 g in sodium chloride 0.9 % 50 mL IVPB  Status:  Discontinued     11/30/16 2257   12/01/16 0800  vancomycin (VANCOCIN) 500 mg in sodium chloride 0.9 % 100 mL IVPB  Status:  Discontinued     12/01/16 1151   11/30/16 2300  ampicillin (OMNIPEN) 2 g in sodium chloride 0.9 % 50 mL IVPB  Status:  Discontinued     12/01/16 1239   11/30/16 0800  vancomycin (VANCOCIN) IVPB 1000 mg/200 mL premix  Status:  Discontinued     11/30/16 1030   11/29/16 0600  acyclovir (ZOVIRAX) 700 mg in dextrose 5 % 100 mL IVPB  Status:  Discontinued     12/01/16 1239   11/29/16 0415  cefTRIAXone (ROCEPHIN) 2 g in dextrose 5 % 50 mL IVPB  Status:  Discontinued     12/01/16 1239   11/29/16 0415  vancomycin (VANCOCIN) IVPB 1000 mg/200 mL premix     11/29/16 0612   11/29/16 0400  ampicillin (OMNIPEN) 2 g in sodium chloride 0.9 % 50 mL IVPB  Status:  Discontinued     11/30/16 2022       Devices    LINES / TUBES:      Continuous Infusions: . sodium chloride 50 mL/hr at 12/02/16 0806     Objective: Vitals:   12/01/16 2300 12/02/16 0013 12/02/16 0356 12/02/16 0802  BP: (!) 158/78 (!) 164/66 (!) 150/65 (!) 152/79  Pulse: (!) 54 (!) 45 (!) 42 (!) 52  Resp: 14 13 12 12   Temp: 98.6 F (37 C) 98.6 F (37 C) 98.6 F (37 C) 97.9 F (36.6 C)  TempSrc:  Oral Axillary Axillary  SpO2: 100% 100% 100% 100%  Weight:      Height:        Intake/Output Summary (Last 24 hours) at 12/02/16 2025 Last data filed at 12/02/16 0800  Gross per 24 hour  Intake          1923.75 ml  Output              600 ml  Net          1323.75 ml   Filed Weights   11/28/16 2340 11/29/16 0308  Weight: 180 lb (81.6 kg) 153 lb 10.6 oz (69.7 kg)    Examination:  General: Sleepy but arousable, A/O 4, No acute respiratory distress Eyes: negative scleral hemorrhage, negative anisocoria, negative icterus ENT:  Negative Runny nose, negative gingival bleeding, Neck:  Negative scars, masses, torticollis, lymphadenopathy, JVD Lungs: Clear to auscultation bilaterally without wheezes or crackles Cardiovascular: Regular rate and rhythm without murmur gallop or rub normal S1 and S2 Abdomen: negative abdominal pain, nondistended, positive soft, bowel sounds, no rebound, no ascites, no appreciable mass Extremities: No significant cyanosis, clubbing, or edema bilateral lower extremities Skin: Negative rashes, lesions, ulcers Psychiatric:  Negative depression, negative anxiety, negative fatigue, negative mania  Central nervous system:  Cranial nerves II through XII intact, tongue/uvula midline, all extremities muscle strength 5/5,  negative dysarthria, negative expressive aphasia, negative receptive aphasia.  .     Data Reviewed: Care during the described time interval was provided by me .  I have reviewed this patient's available data, including medical history, events of note, physical examination, and all test results as part of my evaluation. I have personally reviewed and interpreted all radiology studies.  CBC:  Recent Labs Lab 11/28/16 2021 11/29/16 0530 11/30/16 0841 12/01/16 1807 12/02/16 0445  WBC 7.9 9.0 8.3 8.6 8.5  NEUTROABS 5.8 7.4 6.5 7.4  --   HGB 11.9* 10.4* 11.8* 9.7* 10.2*  HCT 36.4 33.1* 36.5 31.5* 33.3*  MCV 83.9 84.4 85.5 86.5 86.0  PLT 235 203 183 198 384   Basic Metabolic Panel:  Recent Labs Lab 11/28/16 2021 11/29/16 0530 11/30/16 0841 12/01/16 1807 12/02/16 0445  NA 143 143 147* 137 140  K 4.7 4.3 4.1 3.8 3.6  CL 112* 112* 119* 108 109  CO2 18* 20* 22 21* 24  GLUCOSE 112* 135* 146* 112* 96  BUN 51* 44* 21* 24* 28*  CREATININE 1.95* 1.66* 0.97 1.01* 1.11*  CALCIUM 9.5 9.1 8.9 8.4* 8.9  MG  --  1.5* 1.8 2.0 2.0  PHOS  --   --   --   --  2.8   GFR: Estimated Creatinine Clearance: 42.3 mL/min (A) (by C-G formula based on SCr of 1.11 mg/dL (H)). Liver Function  Tests:  Recent Labs Lab 11/28/16 2021 11/29/16 0530 12/02/16 0445  AST 34 32 17  ALT 20 20 16   ALKPHOS 122 110 91  BILITOT 0.6 0.6 0.4  PROT 6.2* 6.0* 4.5*  ALBUMIN 3.8 3.4* 2.6*   No results for input(s): LIPASE, AMYLASE in the last 168 hours.  Recent Labs Lab 11/29/16 0530  AMMONIA 12   Coagulation Profile: No results for input(s): INR, PROTIME in the last 168 hours. Cardiac Enzymes: No results for input(s): CKTOTAL, CKMB, CKMBINDEX, TROPONINI in the last 168 hours. BNP (last 3 results) No results for input(s): PROBNP in the last 8760 hours. HbA1C: No results for input(s): HGBA1C in the last 72 hours. CBG:  Recent Labs Lab 12/01/16 1157 12/01/16 1652 12/01/16 2037 12/02/16 0011 12/02/16 0355  GLUCAP 119* 118* 97 94 71   Lipid Profile: No results for input(s): CHOL, HDL, LDLCALC, TRIG, CHOLHDL, LDLDIRECT in the last 72 hours. Thyroid Function Tests: No results for input(s): TSH, T4TOTAL, FREET4, T3FREE, THYROIDAB in the last 72 hours. Anemia Panel: No results for input(s): VITAMINB12, FOLATE, FERRITIN, TIBC, IRON, RETICCTPCT in the last 72 hours. Urine analysis:    Component Value Date/Time   COLORURINE YELLOW 11/28/2016 2004   APPEARANCEUR HAZY (A) 11/28/2016 2004   LABSPEC 1.016 11/28/2016 2004   PHURINE 5.0 11/28/2016 2004   GLUCOSEU NEGATIVE 11/28/2016 2004   HGBUR NEGATIVE 11/28/2016 2004   BILIRUBINUR NEGATIVE 11/28/2016 2004   BILIRUBINUR n 06/17/2013 1213   KETONESUR 5 (A) 11/28/2016 2004   PROTEINUR NEGATIVE 11/28/2016 2004   UROBILINOGEN 0.2 06/17/2013 1213   NITRITE NEGATIVE 11/28/2016 2004   LEUKOCYTESUR NEGATIVE 11/28/2016 2004   Sepsis Labs: @LABRCNTIP (procalcitonin:4,lacticidven:4)  ) Recent Results (from the past 240 hour(s))  Culture, blood (routine x 2)     Status: Abnormal   Collection Time: 11/28/16  8:15 PM  Result Value Ref Range Status   Specimen Description BLOOD RIGHT FOREARM  Final   Special Requests   Final     BOTTLES DRAWN AEROBIC AND ANAEROBIC Blood Culture adequate volume   Culture  Setup Time   Final    GRAM POSITIVE COCCI IN CLUSTERS ANAEROBIC BOTTLE ONLY CRITICAL RESULT CALLED TO, READ BACK BY AND VERIFIED WITH: Lavell Luster PHARMD 2224 11/29/16 A  BROWNING    Culture (A)  Final    STAPHYLOCOCCUS SPECIES (COAGULASE NEGATIVE) THE SIGNIFICANCE OF ISOLATING THIS ORGANISM FROM A SINGLE SET OF BLOOD CULTURES WHEN MULTIPLE SETS ARE DRAWN IS UNCERTAIN. PLEASE NOTIFY THE MICROBIOLOGY DEPARTMENT WITHIN ONE WEEK IF SPECIATION AND SENSITIVITIES ARE REQUIRED. Performed at [facility], [place] [address], [place], [place] [address]    Report Status 12/01/2016 FINAL  Final  Urine culture     Status: None   Collection Time: 11/28/16  8:15 PM  Result Value Ref Range Status   Specimen Description URINE, CLEAN CATCH  Final   Special Requests Normal  Final   Culture   Final    NO GROWTH Performed at [facility], [place] [address] [place], [place] [address]    Report Status 11/30/2016 FINAL  Final  Blood Culture ID Panel (Reflexed)     Status: Abnormal   Collection Time: 11/28/16  8:15 PM  Result Value Ref Range Status   Enterococcus species NOT DETECTED NOT DETECTED Final   Listeria monocytogenes NOT DETECTED NOT DETECTED Final   Staphylococcus species DETECTED (A) NOT DETECTED Final    Comment: Methicillin (oxacillin) susceptible coagulase negative staphylococcus. Possible blood culture contaminant (unless isolated from more than one blood culture draw or clinical case suggests pathogenicity). No antibiotic treatment is indicated for blood  culture contaminants. CRITICAL RESULT CALLED TO, READ BACK BY AND VERIFIED WITH: Lavell Luster PHARMD 2224 11/29/16 A BROWNING    Staphylococcus aureus NOT DETECTED NOT DETECTED Final   Methicillin resistance NOT DETECTED NOT DETECTED Final   Streptococcus species NOT DETECTED NOT DETECTED Final   Streptococcus agalactiae NOT DETECTED NOT DETECTED Final    Streptococcus pneumoniae NOT DETECTED NOT DETECTED Final   Streptococcus pyogenes NOT DETECTED NOT DETECTED Final   Acinetobacter baumannii NOT DETECTED NOT DETECTED Final   Enterobacteriaceae species NOT DETECTED NOT DETECTED Final   Enterobacter cloacae complex NOT DETECTED NOT DETECTED Final   Escherichia coli NOT DETECTED NOT DETECTED Final   Klebsiella oxytoca NOT DETECTED NOT DETECTED Final   Klebsiella pneumoniae NOT DETECTED NOT DETECTED Final   Proteus species NOT DETECTED NOT DETECTED Final   Serratia marcescens NOT DETECTED NOT DETECTED Final   Haemophilus influenzae NOT DETECTED NOT DETECTED Final   Neisseria meningitidis NOT DETECTED NOT DETECTED Final   Pseudomonas aeruginosa NOT DETECTED NOT DETECTED Final   Candida albicans NOT DETECTED NOT DETECTED Final   Candida glabrata NOT DETECTED NOT DETECTED Final   Candida krusei NOT DETECTED NOT DETECTED Final   Candida parapsilosis NOT DETECTED NOT DETECTED Final   Candida tropicalis NOT DETECTED NOT DETECTED Final    Comment: Performed at [facility], [place] [address], [place], [place] [address]  MRSA PCR Screening     Status: None   Collection Time: 11/29/16  4:14 AM  Result Value Ref Range Status   MRSA by PCR NEGATIVE NEGATIVE Final    Comment:        The GeneXpert MRSA Assay (FDA approved for NASAL specimens only), is one component of a comprehensive MRSA colonization surveillance program. It is not intended to diagnose MRSA infection nor to guide or monitor treatment for MRSA infections.          Radiology Studies: No results found.      Scheduled Meds: . amLODipine  10 mg Oral Daily  . aspirin EC  81 mg Oral Daily  . brimonidine  1 drop Left Eye TID  . chlorhexidine  15  mL Mouth Rinse BID  . Chlorhexidine Gluconate Cloth  6 each Topical Daily  . cyanocobalamin  1,000 mcg Subcutaneous Daily  . docusate sodium  100 mg Oral BID  . FLUoxetine  20 mg Oral Daily  . mouth rinse  15 mL Mouth Rinse  [id number]  . metoprolol tartrate  5 mg Intravenous Q8H  . OLANZapine  2.5 mg Oral QHS  . oxybutynin  10 mg Oral QHS  . pantoprazole  40 mg Oral Daily  . polyethylene glycol  17 g Oral Daily  . sodium chloride flush  10-40 mL Intracatheter Q12H   Continuous Infusions: . sodium chloride 50 mL/hr at 12/02/16 0806     LOS: 3 days    Time spent: 40 minutes    Cederick Broadnax, Geraldo Docker, MD [facility] Pager [phone]   If 7PM-7AM, please contact night-coverage [email or web address] Password [email or web address] 12/02/2016, 8:11 AM

## 2016-12-02 NOTE — Progress Notes (Signed)
Neurology Progress Note  Subjective: No significant 24-hour events have been reported. Per notes she still has some disorientation and spends most of her time sleeping. She rouses to voice for me and denies any complaints on a 10 point ROS.   Medications reviewed and reconciled.   Pertinent meds: Vitamin B12 1000 mcg daily Olanzapine 2.5 mg at bedtime Fluoxetine 20 mg daily  Current Meds:   Current Facility-Administered Medications:  .  0.45 % sodium chloride infusion, , Intravenous, Continuous, Cherene Altes, MD, Last Rate: 50 mL/hr at 12/02/16 0806 .  acetaminophen (TYLENOL) tablet 650 mg, 650 mg, Oral, Q6H PRN **OR** acetaminophen (TYLENOL) suppository 650 mg, 650 mg, Rectal, Q6H PRN, Rise Patience, MD .  amLODipine (NORVASC) tablet 10 mg, 10 mg, Oral, Daily, Joette Catching T, MD .  aspirin EC tablet 81 mg, 81 mg, Oral, Daily, McClung, Dellis Filbert T, MD .  brimonidine (ALPHAGAN) 0.15 % ophthalmic solution 1 drop, 1 drop, Left Eye, TID, Cherene Altes, MD, 1 drop at 12/01/16 2258 .  chlorhexidine (PERIDEX) 0.12 % solution 15 mL, 15 mL, Mouth Rinse, BID, Rise Patience, MD, 15 mL at 12/01/16 2257 .  Chlorhexidine Gluconate Cloth 2 % PADS 6 each, 6 each, Topical, Daily, Cherene Altes, MD, 6 each at 12/01/16 1400 .  cyanocobalamin ((VITAMIN B-12)) injection 1,000 mcg, 1,000 mcg, Subcutaneous, Daily, Eugenie Filler, MD, 1,000 mcg at 11/30/16 1056 .  docusate sodium (COLACE) capsule 100 mg, 100 mg, Oral, BID, Cherene Altes, MD, 100 mg at 12/01/16 2257 .  FLUoxetine (PROZAC) capsule 20 mg, 20 mg, Oral, Daily, Rizwan, Saima, MD, 20 mg at 11/30/16 1056 .  haloperidol (HALDOL) 2 MG/ML solution 2 mg, 2 mg, Oral, Q8H PRN, Cherene Altes, MD .  hydrALAZINE (APRESOLINE) injection 10 mg, 10 mg, Intravenous, Q4H PRN, Rise Patience, MD .  MEDLINE mouth rinse, 15 mL, Mouth Rinse, q12n4p, Rise Patience, MD, 15 mL at 12/01/16 1600 .  metoprolol tartrate  (LOPRESSOR) injection 5 mg, 5 mg, Intravenous, Q8H, Eugenie Filler, MD, 5 mg at 12/01/16 1425 .  OLANZapine (ZYPREXA) tablet 2.5 mg, 2.5 mg, Oral, QHS, Rizwan, Saima, MD, 2.5 mg at 12/01/16 2258 .  ondansetron (ZOFRAN) tablet 4 mg, 4 mg, Oral, Q6H PRN **OR** ondansetron (ZOFRAN) injection 4 mg, 4 mg, Intravenous, Q6H PRN, Rise Patience, MD .  oxybutynin (DITROPAN-XL) 24 hr tablet 10 mg, 10 mg, Oral, QHS, Cherene Altes, MD, 10 mg at 12/01/16 2257 .  pantoprazole (PROTONIX) EC tablet 40 mg, 40 mg, Oral, Daily, McClung, Dellis Filbert T, MD .  polyethylene glycol (MIRALAX / GLYCOLAX) packet 17 g, 17 g, Oral, Daily, McClung, Dellis Filbert T, MD .  sodium chloride flush (NS) 0.9 % injection 10-40 mL, 10-40 mL, Intracatheter, Q12H, Cherene Altes, MD, 10 mL at 12/01/16 2258 .  sodium chloride flush (NS) 0.9 % injection 10-40 mL, 10-40 mL, Intracatheter, PRN, Cherene Altes, MD, 10 mL at 12/01/16 1435  Objective:  Temp:  [96.4 F (35.8 C)-98.6 F (37 C)] 97.9 F (36.6 C) (06/08 0802) Pulse Rate:  [42-54] 52 (06/08 0802) Resp:  [12-18] 12 (06/08 0802) BP: (140-164)/(57-79) 152/79 (06/08 0802) SpO2:  [100 %] 100 % (06/08 0802)  General: WD obese Caucasian woman lying in bed in NAD. She rouses to voice and is appropriate. She is oriented to self, [facility], June, 2018. Speed of processing is better today. Speech is mildly dysarthric but this is somewhat effort-dependent. No obvious aphasia.  HEENT: Neck  is supple without lymphadenopathy. Mucous membranes are slightly dry and the oropharynx is clear. Sclerae are anicteric. There is no conjunctival injection.  CV: Regular, no murmur. Carotid pulses are 2+ and symmetric with no bruits. Distal pulses 2+ and symmetric.  Lungs: CTAB on anterior auscultation.  Neuro: MS: As noted above.  CN: Pupils are equal and reactive from 3-->2 mm bilaterally. EOMI, no nystagmus. There is breakup of smooth pursuits. Facial sensation is intact to light  touch. Face is symmetric at rest with normal strength and mobility. Hearing is intact to conversational voice. Voice is normal in tone and quality.Tongue is midline with normal bulk and mobility.  Motor: Normal bulk, tone. She has mild proximal weakness x4, o/w 5/5. No tremor or other abnormal movements are observed.  Sensation: Intact to light touch throughout.  DTRs: 3+, symmetric. Greater than 10 beats of clonus is present at both ankles. Hoffman's absent bilaterally. Toes are upgoing bilaterally.  Coordination: FTN is slow without dysmetria. She tends to miss the target but this is likely due to her monocular vision.   Labs: Lab Results  Component Value Date   WBC 8.5 12/02/2016   HGB 10.2 (L) 12/02/2016   HCT 33.3 (L) 12/02/2016   PLT 194 12/02/2016   GLUCOSE 96 12/02/2016   CHOL 242 (H) 04/17/2012   TRIG 204.0 (H) 04/17/2012   HDL 48.40 04/17/2012   LDLDIRECT 164.3 04/17/2012   ALT 16 12/02/2016   AST 17 12/02/2016   NA 140 12/02/2016   K 3.6 12/02/2016   CL 109 12/02/2016   CREATININE 1.11 (H) 12/02/2016   BUN 28 (H) 12/02/2016   CO2 24 12/02/2016   TSH 1.318 11/29/2016   HGBA1C 5.7 04/17/2012   CBC Latest Ref Rng & Units 12/02/2016 12/01/2016 11/30/2016  WBC 4.0 - 10.5 K/uL 8.5 8.6 8.3  Hemoglobin 12.0 - 15.0 g/dL 10.2(L) 9.7(L) 11.8(L)  Hematocrit 36.0 - 46.0 % 33.3(L) 31.5(L) 36.5  Platelets 150 - 400 K/uL 194 198 183    Lab Results  Component Value Date   HGBA1C 5.7 04/17/2012   Lab Results  Component Value Date   ALT 16 12/02/2016   AST 17 12/02/2016   ALKPHOS 91 12/02/2016   BILITOT 0.4 12/02/2016    Radiology:  No new neuroimaging.   Other diagnostic studies:  EEG 12/01/16: mild to moderate diffuse slowing without epileptiform activity or seizure.   A/P:   1. Encephalopathy: Overall she seems to be doing fairly well with the biggest issues being that she sleeps most of the time and is disoriented to time. I suspect that her encephalopathy is largely  related to medication effect and metabolic issues. I do not think her neurosarcoidosis is contributing much here but will check MRI brain with and without contrast today. She is very alert and appropriate and feels that she can tolerate the MRI without a problem. This has been scheduled under anesthesia today but my concern is that anesthesia may set her back, particularly since today is the best she has been for me. I will attempt the MRI without anesthesia but will make Ativan available if needed. This was discussed with her RN. EEG did not show any evidence of seizure activity. Continue supportive care. Continue to hold CNS active medications, particularly benzos (except for MRI if needed, as above), opiates, and anything with strong anticholinergic properties. Continue to replete vitamin B-12. She may benefit from speech therapy consultation for more detailed cognitive evaluation. PT/OT to mobilize as tolerated.   2. Neurosarcoidosis:  Low suspicion that this is playing an active role at this time. MRI scan of the brain with and without contrast pending. If no evidence of acute disease, would favor returning her to her outpatient steroid regimen which is prednisone 2.5 mg daily.  This was discussed with the patient and she is in agreement with the plan as stated. She was given the chance to ask any questions.    Melba Coon, MD [facility]

## 2016-12-02 NOTE — Progress Notes (Signed)
Patient has order for CPAP but says she don't wear one. Patient on 3LNC 02 saturations at 97%. RT will continue to monitor

## 2016-12-02 NOTE — Progress Notes (Addendum)
12/01/16 @ 0700:  Pt. has orders for MRI of the head and spine w/ anesthesia and the pt. Was taken down to short stay around 0630 to prep for the procedure. I relayed that Mrs. Klaas's HR was in the 40s-50 but anesthesia wanted to start prepping her anyway due to her BP being stable. There were also no orders for a consent and pt. is unable to sign for herself (there is a brother in law that can be called, who speaks on behalf of Mrs. Maulding's husband). After transporting the pt. to short stay and speaking with Anesthesia they stated they did not want to take part in the procedure due to the fact "there is not an H&P from the primary care provider stating why the procedure is to be done under anesthesia". The pt. was then brought back to 3M08 and is awaiting further orders/instructions on the procedure. Will continue to monitor.   12/02/16 @ 2030: Talked to Delfina Redwood, CRNA from anesthesia inpatient scheduling and was told that they still want the procedure completed. The pt. was added to the "add-on list" and is expected to go for the procedure sometime during the day.   Mayford Knife, RN

## 2016-12-03 LAB — GLUCOSE, CAPILLARY
Glucose-Capillary: 124 mg/dL — ABNORMAL HIGH (ref 65–99)
Glucose-Capillary: 83 mg/dL (ref 65–99)
Glucose-Capillary: 85 mg/dL (ref 65–99)
Glucose-Capillary: 92 mg/dL (ref 65–99)
Glucose-Capillary: 93 mg/dL (ref 65–99)
Glucose-Capillary: 94 mg/dL (ref 65–99)

## 2016-12-03 NOTE — Progress Notes (Signed)
Arrived from [facility]. Alert and oriented x3. Denies any pain. Call light within reach.

## 2016-12-03 NOTE — Progress Notes (Signed)
[facility] TEAM 1 - Stepdown/ICU Sheryl [name]  UMP:[id number] DOB: 12-19-1945 DOA: 11/28/2016 PCP: Marletta Lor, MD    Brief Narrative:  71 y.o.femalewith history of neurosarcoidosis presently off medications, CLL, fibromyalgia, and hypertension who had low back surgery at [facility] in May 2018 and was brought to [facility] ED from her SNF w/ 24hrs of confusion/AMS.  In the ER periodic episodes of confusion with agitation were noted. The only complaint the patient had was mid back pain around the shoulders. Neck rigidity was noted on exam. The scar over the recent surgery had no active discharge. Lumbar CT showed no definite infection.  The patient required transferred to [facility] 6/6 to allow for MRI with general anesthesia.  Subjective: The patient is much improved compared to the last time that I saw her.  She denies chest pain shortness of breath fevers chills nausea or vomiting.  She is alert and interactive but mildly sedate.  Assessment & Plan:  Acute v/s subacute encephalopathy UA/urine cultures negative - chest x-ray unrevealing - CT head negative for acute abnormality - LP not accomplished due to recent lumbar surgery - extensive review of recent history performed by neurologist suggest this is not a new problem and has been present since early May 2018 - limit all sedating medications - MRI head unrevealing - EEG unrevealing - appears to be near her baseline at this time   Coag neg Staph + blood cx Appears only 1 blood culture was obtained - suspect this represents a contaminant  Hypernatremia Corrected with volume resuscitation  Acute kidney injury Baseline creatinine 0.9 - has nearly resolved with volume resuscitation - stop IV fluid and recheck in a.m.  Recent Labs Lab 11/28/16 2021 11/29/16 0530 11/30/16 0841 12/01/16 1807 12/02/16 0445  CREATININE 1.95* 1.66* 0.97 1.01* 1.11*   HTN Blood pressure stable at this time  HLD Continue  statin as able  Low normal B-12 levels Continue B-12 supplementation  Hypomagnesemia Corrected to normal range   Hypokalemia Corrected to normal range  Neurosarcoidosis F/U as outpatient   Chronic L ear deafness  Chronic blindness R eye   DVT prophylaxis: SCDs Code Status: FULL CODE Family Communication: no family present at time of exam  Disposition Plan: SDU  Consultants:  Neurology   Procedures: none  Antimicrobials:  Acyclovir 6/5 > 6/6 Ampicillin 6/5 > 6/6 Rocephin 6/5 > 6/6 Vancomycin 6/5 > 6/6  Objective: Blood pressure (!) 121/53, pulse 67, temperature 98.3 F (36.8 C), temperature source Oral, resp. rate 11, height 5' (1.524 m), weight 69.7 kg (153 lb 10.6 oz), SpO2 100 %.  Intake/Output Summary (Last 24 hours) at 12/03/16 1509 Last data filed at 12/03/16 1132  Gross per 24 hour  Intake          1475.83 ml  Output             1500 ml  Net           -24.17 ml   Filed Weights   11/28/16 2340 11/29/16 0308  Weight: 81.6 kg (180 lb) 69.7 kg (153 lb 10.6 oz)    Examination: General: Much more alert today but is slightly sedate Lungs: Good air movement throughout all fields Cardiovascular: RRR - no gallup or rub  Abdomen: Soft, bowel sounds positive, no rebound, nontender Extremities: 1+ diffuse edema  CBC:  Recent Labs Lab 11/28/16 2021 11/29/16 0530 11/30/16 0841 12/01/16 1807 12/02/16 0445  WBC 7.9 9.0 8.3 8.6 8.5  NEUTROABS 5.8  7.4 6.5 7.4  --   HGB 11.9* 10.4* 11.8* 9.7* 10.2*  HCT 36.4 33.1* 36.5 31.5* 33.3*  MCV 83.9 84.4 85.5 86.5 86.0  PLT 235 203 183 198 409   Basic Metabolic Panel:  Recent Labs Lab 11/28/16 2021 11/29/16 0530 11/30/16 0841 12/01/16 1807 12/02/16 0445  NA 143 143 147* 137 140  K 4.7 4.3 4.1 3.8 3.6  CL 112* 112* 119* 108 109  CO2 18* 20* 22 21* 24  GLUCOSE 112* 135* 146* 112* 96  BUN 51* 44* 21* 24* 28*  CREATININE 1.95* 1.66* 0.97 1.01* 1.11*  CALCIUM 9.5 9.1 8.9 8.4* 8.9  MG  --  1.5* 1.8  2.0 2.0  PHOS  --   --   --   --  2.8   GFR: Estimated Creatinine Clearance: 42.3 mL/min (A) (by C-G formula based on SCr of 1.11 mg/dL (H)).  Liver Function Tests:  Recent Labs Lab 11/28/16 2021 11/29/16 0530 12/02/16 0445  AST 34 32 17  ALT 20 20 16   ALKPHOS 122 110 91  BILITOT 0.6 0.6 0.4  PROT 6.2* 6.0* 4.5*  ALBUMIN 3.8 3.4* 2.6*    Recent Labs Lab 11/29/16 0530  AMMONIA 12    HbA1C: Hgb A1c MFr Bld  Date/Time Value Ref Range Status  04/17/2012 10:36 AM 5.7 4.6 - 6.5 % Final    Comment:    Glycemic Control Guidelines for People with Diabetes:Non Diabetic:  <6%Goal of Therapy: <7%Additional Action Suggested:  >8%   01/04/2012 10:49 AM 6.4 4.6 - 6.5 % Final    Comment:    Glycemic Control Guidelines for People with Diabetes:Non Diabetic:  <6%Goal of Therapy: <7%Additional Action Suggested:  >8%     CBG:  Recent Labs Lab 12/02/16 2025 12/03/16 0000 12/03/16 0353 12/03/16 0808 12/03/16 1201  GLUCAP 103* 85 83 93 124*    Recent Results (from the past 240 hour(s))  Culture, blood (routine x 2)     Status: Abnormal   Collection Time: 11/28/16  8:15 PM  Result Value Ref Range Status   Specimen Description BLOOD RIGHT FOREARM  Final   Special Requests   Final    BOTTLES DRAWN AEROBIC AND ANAEROBIC Blood Culture adequate volume   Culture  Setup Time   Final    GRAM POSITIVE COCCI IN CLUSTERS ANAEROBIC BOTTLE ONLY CRITICAL RESULT CALLED TO, READ BACK BY AND VERIFIED WITH: Lavell Luster PHARMD 2224 11/29/16 A BROWNING    Culture (A)  Final    STAPHYLOCOCCUS SPECIES (COAGULASE NEGATIVE) THE SIGNIFICANCE OF ISOLATING THIS ORGANISM FROM A SINGLE SET OF BLOOD CULTURES WHEN MULTIPLE SETS ARE DRAWN IS UNCERTAIN. PLEASE NOTIFY THE MICROBIOLOGY DEPARTMENT WITHIN ONE WEEK IF SPECIATION AND SENSITIVITIES ARE REQUIRED. Performed at [facility], [place] [address], [place], [place] [address]    Report Status 12/01/2016 FINAL  Final  Urine culture     Status: None    Collection Time: 11/28/16  8:15 PM  Result Value Ref Range Status   Specimen Description URINE, CLEAN CATCH  Final   Special Requests Normal  Final   Culture   Final    NO GROWTH Performed at [facility], [place] [address] [place],  [address]    Report Status 11/30/2016 FINAL  Final  Blood Culture ID Panel (Reflexed)     Status: Abnormal   Collection Time: 11/28/16  8:15 PM  Result Value Ref Range Status   Enterococcus species NOT DETECTED NOT DETECTED Final   Listeria monocytogenes  NOT DETECTED NOT DETECTED Final   Staphylococcus species DETECTED (A) NOT DETECTED Final    Comment: Methicillin (oxacillin) susceptible coagulase negative staphylococcus. Possible blood culture contaminant (unless isolated from more than one blood culture draw or clinical case suggests pathogenicity). No antibiotic treatment is indicated for blood  culture contaminants. CRITICAL RESULT CALLED TO, READ BACK BY AND VERIFIED WITH: Lavell Luster PHARMD 2224 11/29/16 A BROWNING    Staphylococcus aureus NOT DETECTED NOT DETECTED Final   Methicillin resistance NOT DETECTED NOT DETECTED Final   Streptococcus species NOT DETECTED NOT DETECTED Final   Streptococcus agalactiae NOT DETECTED NOT DETECTED Final   Streptococcus pneumoniae NOT DETECTED NOT DETECTED Final   Streptococcus pyogenes NOT DETECTED NOT DETECTED Final   Acinetobacter baumannii NOT DETECTED NOT DETECTED Final   Enterobacteriaceae species NOT DETECTED NOT DETECTED Final   Enterobacter cloacae complex NOT DETECTED NOT DETECTED Final   Escherichia coli NOT DETECTED NOT DETECTED Final   Klebsiella oxytoca NOT DETECTED NOT DETECTED Final   Klebsiella pneumoniae NOT DETECTED NOT DETECTED Final   Proteus species NOT DETECTED NOT DETECTED Final   Serratia marcescens NOT DETECTED NOT DETECTED Final   Haemophilus influenzae NOT DETECTED NOT DETECTED Final   Neisseria meningitidis NOT DETECTED NOT DETECTED Final   Pseudomonas aeruginosa NOT  DETECTED NOT DETECTED Final   Candida albicans NOT DETECTED NOT DETECTED Final   Candida glabrata NOT DETECTED NOT DETECTED Final   Candida krusei NOT DETECTED NOT DETECTED Final   Candida parapsilosis NOT DETECTED NOT DETECTED Final   Candida tropicalis NOT DETECTED NOT DETECTED Final    Comment: Performed at [facility], [place] [address] [place],  [address]  MRSA PCR Screening     Status: None   Collection Time: 11/29/16  4:14 AM  Result Value Ref Range Status   MRSA by PCR NEGATIVE NEGATIVE Final    Comment:        The GeneXpert MRSA Assay (FDA approved for NASAL specimens only), is one component of a comprehensive MRSA colonization surveillance program. It is not intended to diagnose MRSA infection nor to guide or monitor treatment for MRSA infections.      Scheduled Meds: . amLODipine  10 mg Oral Daily  . aspirin EC  81 mg Oral Daily  . brimonidine  1 drop Left Eye TID  . chlorhexidine  15 mL Mouth Rinse BID  . Chlorhexidine Gluconate Cloth  6 each Topical Daily  . cyanocobalamin  1,000 mcg Subcutaneous Daily  . docusate sodium  100 mg Oral BID  . dronabinol  5 mg Oral BID AC  . FLUoxetine  20 mg Oral Daily  . mouth rinse  15 mL Mouth Rinse q12n4p  . metoprolol tartrate  5 mg Intravenous Q8H  . OLANZapine  2.5 mg Oral QHS  . oxybutynin  10 mg Oral QHS  . pantoprazole  40 mg Oral Daily  . polyethylene glycol  17 g Oral Daily  . sodium chloride flush  10-40 mL Intracatheter Q12H     LOS: 4 days   Cherene Altes, MD [facility] Office  [phone] Pager - Text Page per Amion as per below:  On-Call/Text Page:      [email or web address]      password [email or web address]  If 7PM-7AM, please contact night-coverage [email or web address] Password [email or web address] 12/03/2016, 3:09 PM

## 2016-12-03 NOTE — Progress Notes (Signed)
Neurology Progress Note  Subjective: No significant 24-hour events have been reported. MRI scan completed yesterday. She is tearful this morning, asking if I can please let her out of the hospital because she wants to see her husband. She is worried about him because of his cancer. She has no complaints on a 10 point ROS.   Medications reviewed and reconciled.   Pertinent meds: Vitamin B12 1000 mcg daily Olanzapine 2.5 mg at bedtime Fluoxetine 20 mg daily Dronabinol 5 mg BID  Current Meds:   Current Facility-Administered Medications:  .  acetaminophen (TYLENOL) tablet 650 mg, 650 mg, Oral, Q6H PRN **OR** acetaminophen (TYLENOL) suppository 650 mg, 650 mg, Rectal, Q6H PRN, Rise Patience, MD .  amLODipine (NORVASC) tablet 10 mg, 10 mg, Oral, Daily, Cherene Altes, MD, 10 mg at 12/02/16 0916 .  aspirin EC tablet 81 mg, 81 mg, Oral, Daily, Cherene Altes, MD, 81 mg at 12/02/16 0916 .  brimonidine (ALPHAGAN) 0.15 % ophthalmic solution 1 drop, 1 drop, Left Eye, TID, Cherene Altes, MD, 1 drop at 12/02/16 2200 .  chlorhexidine (PERIDEX) 0.12 % solution 15 mL, 15 mL, Mouth Rinse, BID, Rise Patience, MD, 15 mL at 12/02/16 2200 .  Chlorhexidine Gluconate Cloth 2 % PADS 6 each, 6 each, Topical, Daily, Cherene Altes, MD, 6 each at 12/02/16 1000 .  cyanocobalamin ((VITAMIN B-12)) injection 1,000 mcg, 1,000 mcg, Subcutaneous, Daily, Eugenie Filler, MD, 1,000 mcg at 12/02/16 [phone] .  dextrose 5 %-0.45 % sodium chloride infusion, , Intravenous, Continuous, Allie Bossier, MD, Last Rate: 50 mL/hr at 12/02/16 1711 .  docusate sodium (COLACE) capsule 100 mg, 100 mg, Oral, BID, Cherene Altes, MD, 100 mg at 12/02/16 2339 .  dronabinol (MARINOL) capsule 5 mg, 5 mg, Oral, BID AC, Ihor Austin, RPH, 5 mg at 12/02/16 1841 .  FLUoxetine (PROZAC) capsule 20 mg, 20 mg, Oral, Daily, Rizwan, Saima, MD, 20 mg at 12/02/16 0916 .  haloperidol (HALDOL) 2 MG/ML solution 2 mg, 2 mg,  Oral, Q8H PRN, Cherene Altes, MD .  hydrALAZINE (APRESOLINE) injection 10 mg, 10 mg, Intravenous, Q4H PRN, Rise Patience, MD .  lactated ringers infusion, , Intravenous, Continuous, Roderic Palau, MD .  LORazepam (ATIVAN) injection 1 mg, 1 mg, Intravenous, Q10 min PRN, Darrel Reach, MD .  MEDLINE mouth rinse, 15 mL, Mouth Rinse, q12n4p, Rise Patience, MD, 15 mL at 12/02/16 1619 .  metoprolol tartrate (LOPRESSOR) injection 5 mg, 5 mg, Intravenous, Q8H, Eugenie Filler, MD, 5 mg at 12/03/16 0511 .  OLANZapine (ZYPREXA) tablet 2.5 mg, 2.5 mg, Oral, QHS, Rizwan, Saima, MD, 2.5 mg at 12/02/16 2349 .  ondansetron (ZOFRAN) tablet 4 mg, 4 mg, Oral, Q6H PRN **OR** ondansetron (ZOFRAN) injection 4 mg, 4 mg, Intravenous, Q6H PRN, Rise Patience, MD .  oxybutynin (DITROPAN-XL) 24 hr tablet 10 mg, 10 mg, Oral, QHS, Cherene Altes, MD, 10 mg at 12/02/16 2350 .  pantoprazole (PROTONIX) EC tablet 40 mg, 40 mg, Oral, Daily, Cherene Altes, MD, 40 mg at 12/02/16 0916 .  polyethylene glycol (MIRALAX / GLYCOLAX) packet 17 g, 17 g, Oral, Daily, Cherene Altes, MD, 17 g at 12/02/16 0917 .  sodium chloride flush (NS) 0.9 % injection 10-40 mL, 10-40 mL, Intracatheter, Q12H, Cherene Altes, MD, 10 mL at 12/02/16 2200 .  sodium chloride flush (NS) 0.9 % injection 10-40 mL, 10-40 mL, Intracatheter, PRN, Cherene Altes, MD, 10 mL at 12/01/16 1435  Objective:  Temp:  [97.6 F (36.4 C)-98.7 F (37.1 C)] 98.5 F (36.9 C) (06/09 0738) Pulse Rate:  [52-101] 60 (06/09 0738) Resp:  [12-19] 16 (06/09 0738) BP: (88-152)/(45-108) 133/64 (06/09 0738) SpO2:  [100 %] 100 % (06/09 0738)  General: WD obese Caucasian woman lying in bed in NAD. She is alert and oriented to all but day and date. Speech is clear. No obvious aphasia. She is tearful.  HEENT: Neck is supple without lymphadenopathy. Mucous membranes are slightly dry and the oropharynx is clear. Sclerae are  anicteric. There is no conjunctival injection.  CV: Regular, no murmur. Carotid pulses are 2+ and symmetric with no bruits. Distal pulses 2+ and symmetric.  Lungs: CTAB on anterior auscultation. Neuro: MS: As noted above.  CN: Pupils are equal and reactive from 3-->2 mm bilaterally. EOMI, no nystagmus. There is breakup of smooth pursuits. Facial sensation is intact to light touch. Face is symmetric at rest with normal strength and mobility. Hearing is intact to conversational voice. Voice is normal in tone and quality.Tongue is midline with normal bulk and mobility.  Motor: Normal bulk, tone. She has mild proximal weakness in all extremities, worse in legs. Strength is otherwise normal. No tremor or other abnormal movements are observed.  Sensation: Intact to light touch throughout.  DTRs: 3+, symmetric. Clonus is present at both ankles. Hoffman's absent bilaterally. Toes are upgoing bilaterally.  Coordination: FTN is slow without dysmetria.   Labs: Lab Results  Component Value Date   WBC 8.5 12/02/2016   HGB 10.2 (L) 12/02/2016   HCT 33.3 (L) 12/02/2016   PLT 194 12/02/2016   GLUCOSE 96 12/02/2016   CHOL 242 (H) 04/17/2012   TRIG 204.0 (H) 04/17/2012   HDL 48.40 04/17/2012   LDLDIRECT 164.3 04/17/2012   ALT 16 12/02/2016   AST 17 12/02/2016   NA 140 12/02/2016   K 3.6 12/02/2016   CL 109 12/02/2016   CREATININE 1.11 (H) 12/02/2016   BUN 28 (H) 12/02/2016   CO2 24 12/02/2016   TSH 1.318 11/29/2016   HGBA1C 5.7 04/17/2012   CBC Latest Ref Rng & Units 12/02/2016 12/01/2016 11/30/2016  WBC 4.0 - 10.5 K/uL 8.5 8.6 8.3  Hemoglobin 12.0 - 15.0 g/dL 10.2(L) 9.7(L) 11.8(L)  Hematocrit 36.0 - 46.0 % 33.3(L) 31.5(L) 36.5  Platelets 150 - 400 K/uL 194 198 183    Lab Results  Component Value Date   HGBA1C 5.7 04/17/2012   Lab Results  Component Value Date   ALT 16 12/02/2016   AST 17 12/02/2016   ALKPHOS 91 12/02/2016   BILITOT 0.4 12/02/2016    Radiology:  I have personally and  independently reviewed the MRI brain with and without contrast from yesterday. No acute abnormality is seen. Post-op changes with prior R temporal craniotomy are noted with some focal encephalomalacia in the underlying right temporal lobe. There is a mild degree of chronic small vessel ischemic change in the bihemispheric white matter. No abnormal enhancement is seen.   Other diagnostic studies:  EEG 12/01/16: mild to moderate diffuse slowing without epileptiform activity or seizure.   A/P:   1. Encephalopathy: She is doing better. I suspect that her encephalopathy is largely related to medication effect and metabolic issues. MRI does not show any evidence to suggest active neurosarcoidosis and this does not appear to be an issue at this time. EEG did not show any evidence of seizure activity. Continue supportive care. Continue to hold CNS active medications, particularly benzos, opiates, and anything with  strong anticholinergic properties. Continue to replete vitamin B-12. She may benefit from speech therapy consultation for more detailed cognitive evaluation. PT/OT to mobilize as tolerated.   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  This was discussed with the patient and she is in agreement with the plan as stated. She was given the chance to ask any questions. She is very upset and wants to get out of the hospital so that she can see her husband.   At this time I have no additional recommendations and will sign off. Please call if any new issues should arise.    Sheryl Coon, MD [facility]

## 2016-12-03 NOTE — Progress Notes (Signed)
Report given to Hin, RN on [facility] who will be taking care of the patient. Patient transferred via bed to unit. Glasses are at [facility] and will be retrieved by patient's niece. CCMD notified that patient will be on telemetry on this unit. Family made aware of transfer.   [name], RN

## 2016-12-03 NOTE — Progress Notes (Signed)
Patient refuses CPAP.  RT will continue to monitor. 

## 2016-12-03 NOTE — Progress Notes (Signed)
Talked with patient about wearing CPAP for the night and the benefits of CPAP therapy. Patient stated she felt like she did not need CPAP at this time

## 2016-12-04 LAB — CBC
HCT: 34.1 % — ABNORMAL LOW (ref 36.0–46.0)
Hemoglobin: 10.9 g/dL — ABNORMAL LOW (ref 12.0–15.0)
MCH: 26.7 pg (ref 26.0–34.0)
MCHC: 32 g/dL (ref 30.0–36.0)
MCV: 83.4 fL (ref 78.0–100.0)
Platelets: 172 10*3/uL (ref 150–400)
RBC: 4.09 MIL/uL (ref 3.87–5.11)
RDW: 14.8 % (ref 11.5–15.5)
WBC: 6.8 10*3/uL (ref 4.0–10.5)

## 2016-12-04 LAB — BASIC METABOLIC PANEL
Anion gap: 7 (ref 5–15)
BUN: 17 mg/dL (ref 6–20)
CO2: 25 mmol/L (ref 22–32)
Calcium: 9.2 mg/dL (ref 8.9–10.3)
Chloride: 108 mmol/L (ref 101–111)
Creatinine, Ser: 0.88 mg/dL (ref 0.44–1.00)
GFR calc Af Amer: >60 mL/min (ref >60)
GFR calc non Af Amer: >60 mL/min (ref >60)
Glucose, Bld: 117 mg/dL — ABNORMAL HIGH (ref 65–99)
Potassium: 3.2 mmol/L — ABNORMAL LOW (ref 3.5–5.1)
Sodium: 140 mmol/L (ref 135–145)

## 2016-12-04 LAB — GLUCOSE, CAPILLARY
Glucose-Capillary: 103 mg/dL — ABNORMAL HIGH (ref 65–99)
Glucose-Capillary: 106 mg/dL — ABNORMAL HIGH (ref 65–99)
Glucose-Capillary: 117 mg/dL — ABNORMAL HIGH (ref 65–99)
Glucose-Capillary: 62 mg/dL — ABNORMAL LOW (ref 65–99)
Glucose-Capillary: 69 mg/dL (ref 65–99)
Glucose-Capillary: 75 mg/dL (ref 65–99)
Glucose-Capillary: 84 mg/dL (ref 65–99)
Glucose-Capillary: 93 mg/dL (ref 65–99)
Glucose-Capillary: 99 mg/dL (ref 65–99)

## 2016-12-04 MED ORDER — HALOPERIDOL LACTATE 2 MG/ML PO CONC
2.0000 mg | Freq: Four times a day (QID) | ORAL | Status: DC | PRN
  Filled 2016-12-04: qty 2.5

## 2016-12-04 MED ORDER — HALOPERIDOL LACTATE 5 MG/ML IJ SOLN
5.0000 mg | Freq: Once | INTRAMUSCULAR | Status: AC
  Administered 2016-12-05: 5 mg via INTRAVENOUS
  Filled 2016-12-04 (×2): qty 1

## 2016-12-04 MED ORDER — SODIUM CHLORIDE 0.9% FLUSH
10.0000 mL | INTRAVENOUS | Status: DC | PRN
  Filled 2016-12-04: qty 40

## 2016-12-04 MED ORDER — POTASSIUM CHLORIDE CRYS ER 20 MEQ PO TBCR
40.0000 meq | EXTENDED_RELEASE_TABLET | Freq: Two times a day (BID) | ORAL | Status: AC
  Administered 2016-12-04 – 2016-12-05 (×3): 40 meq via ORAL
  Filled 2016-12-04 (×3): qty 2

## 2016-12-04 NOTE — Progress Notes (Signed)
Pt still confused, talking to self . Pt screaming wanting to "get out of here" and wanting to see husband. Offer reassurance. Offer to call husband but refused.

## 2016-12-04 NOTE — Progress Notes (Addendum)
CBG 62 gave juice then rechecked 69, drank only a little of juice d/t upset stomach per pt, rechecked 117. Dr. Myna Hidalgo made aware.

## 2016-12-04 NOTE — Evaluation (Signed)
Occupational Therapy Evaluation Patient Details Name: Sheryl [name] MRN: [id number] DOB: 1945/10/23 Today's Date: 12/04/2016    History of Present Illness 71 yo F with h/o neurosarcoidosis presently off medications, CLL, fibromyalgia, and hypertension who had low back surgery at [facility] in May 2018 and was brought to [facility] ED from her SNF w/ 24hrs of confusion/AMS. 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 Currently mentation improved but still lethargic; mental changes not attributed to neurosarcoidosis.   Clinical Impression   Pt admitted with the above diagnoses and presents with below problem list. Pt will benefit from continued acute OT to address the below listed deficits and maximize independence with basic ADLs prior to d/c to venue below. Prior to back surgery pt reports she was able to complete ADLs using a walker. Pt is currently max +2 physical A for LB ADLs and sit<>stand transfer. Very lethargic and weak. Was briefly more alert sitting EOB after conversation with MD in which time she was asked PLOF questions. Lethargic, eyes closed with minimal verbalizations at start and end of session.       Follow Up Recommendations  SNF    Equipment Recommendations  Other (comment) (defer to next venue)    Recommendations for Other Services       Precautions / Restrictions Precautions Precautions: Fall Precaution Comments: bed alarm Required Braces or Orthoses: Spinal Brace ((?fusion 10/25/16, no brace here, Charge RN will f/u)) Spinal Brace: Lumbar corset;Applied in sitting position (per pt's memory, but no access to postop orders)) Spinal Brace Comments: see notes above, without clear postop order, most conservative and safe decision is to delay ambulation until ?corset brace is available either from home/SNF or by ordering new brace.  PT discussed with Levada Dy, CRN, who will f/u Restrictions Weight Bearing  Restrictions: No      Mobility Bed Mobility Overal bed mobility: Needs Assistance Bed Mobility: Rolling;Sidelying to Sit;Sit to Sidelying Rolling: Mod assist Sidelying to sit: Max assist     Sit to sidelying: Max assist General bed mobility comments: Pt with eyes closed and minimal verbalization. A for powering up and maintaining precautions. Pt reporting fatigue/generalized weakness, groaning with effort, denies pain. A to advance BLE and trunk.  Transfers Overall transfer level: Needs assistance Equipment used: Rolling walker (2 wheeled) Transfers: Sit to/from Stand Sit to Stand: +2 physical assistance;Mod assist;Max assist;From elevated surface         General transfer comment: Cues for scooting hips forward prior to standing, needing max assist for this. Utilized bed pad for scooting hips and to powerup ischium into standing position. Trunk flexion in standing with cues she could minimally, briefly achieve more up right posture. Stood about 20 seconds 2x with 2 minute seated rest break between trials. Needs encouragement during transfers.     Balance Overall balance assessment: Needs assistance Sitting-balance support: Bilateral upper extremity supported;Single extremity supported;Feet supported Sitting balance-Leahy Scale: Fair Sitting balance - Comments: ate a few bites of lunch sitting EOB with no physical A from therapist.    Standing balance support: Bilateral upper extremity supported;During functional activity Standing balance-Leahy Scale: Poor Standing balance comment: dependent on RW and min A to maintain standing mostly due to weakness vs over balance impairment                           ADL either performed or assessed with clinical judgement   ADL Overall ADL's : Needs  assistance/impaired Eating/Feeding: Set up;Sitting;Supervision/ safety   Grooming: Min guard;Sitting Grooming Details (indicate cue type and reason): 1 grooming task Upper Body  Bathing: Sitting;Moderate assistance   Lower Body Bathing: Maximal assistance;+2 for physical assistance;Sit to/from stand   Upper Body Dressing : Moderate assistance;Sitting   Lower Body Dressing: Maximal assistance;+2 for physical assistance;Sit to/from stand                 General ADL Comments: bed mobility and sit<>stand 2x from EOB. Deferred further progression with mobility due to level of arousal/mentation and no back brace present. Pt did take 2-4 bites of food sitting EOB. Thought that lunch tray was breakfast tray at 3:00pm.       Vision Baseline Vision/History:  (Chronic blindness R eye )       Perception     Praxis      Pertinent Vitals/Pain Pain Assessment: No/denies pain     Hand Dominance     Extremity/Trunk Assessment Upper Extremity Assessment Upper Extremity Assessment: Generalized weakness (difficult to fully assess due to level of arousal )   Lower Extremity Assessment Lower Extremity Assessment: Defer to PT evaluation       Communication Communication Communication: No difficulties   Cognition Arousal/Alertness: Lethargic;Awake/alert Behavior During Therapy: Flat affect Overall Cognitive Status: No family/caregiver present to determine baseline cognitive functioning                                 General Comments: Very lethargic upon OT arrival in supine positin in bed. Gradually more alert sitting EOB   General Comments       Exercises     Shoulder Instructions      Home Living Family/patient expects to be discharged to:: Skilled nursing facility                                 Additional Comments: pt came from SNF, expect d/c back to facility especially if mobility remaims limited as medical conditions improve      Prior Functioning/Environment Level of Independence: Needs assistance (after back surgery 10/25/16 until now; was amb w/RW prior)  Gait / Transfers Assistance Needed: walker ADL's /  Homemaking Assistance Needed: no assistance with ADLs            OT Problem List: Decreased activity tolerance;Decreased strength;Impaired balance (sitting and/or standing);Decreased knowledge of use of DME or AE;Decreased knowledge of precautions;Pain      OT Treatment/Interventions: Self-care/ADL training;Therapeutic exercise;DME and/or AE instruction;Therapeutic activities;Patient/family education;Balance training    OT Goals(Current goals can be found in the care plan section) Acute Rehab OT Goals Patient Stated Goal: go home OT Goal Formulation: With patient Time For Goal Achievement: 12/18/16 Potential to Achieve Goals: Good ADL Goals Pt Will Perform Grooming: with modified independence;sitting Pt Will Perform Upper Body Bathing: with modified independence;sitting Pt Will Perform Lower Body Bathing: with min assist;sit to/from stand;with adaptive equipment (+2) Pt Will Perform Upper Body Dressing: with modified independence;sitting Pt Will Perform Lower Body Dressing: with min assist;with adaptive equipment;sit to/from stand (+2) Additional ADL Goal #1: Pt will complete bed mobility at min A level to prepare for OOB ADLs.   OT Frequency: Min 2X/week   Barriers to D/C:            Co-evaluation              AM-PAC PT "6  Clicks" Daily Activity     Outcome Measure Help from another person eating meals?: A Little Help from another person taking care of personal grooming?: A Lot Help from another person toileting, which includes using toliet, bedpan, or urinal?: A Lot Help from another person bathing (including washing, rinsing, drying)?: A Lot Help from another person to put on and taking off regular upper body clothing?: A Lot Help from another person to put on and taking off regular lower body clothing?: A Lot 6 Click Score: 13   End of Session Equipment Utilized During Treatment: Gait belt;Rolling walker Nurse Communication: Other (comment) (NT assisting with  sit<>stand. )  Activity Tolerance: Patient limited by fatigue;Patient limited by lethargy Patient left: in bed;with call bell/phone within reach;with SCD's reapplied;with bed alarm set  OT Visit Diagnosis: Unsteadiness on feet (R26.81);Muscle weakness (generalized) (M62.81)                Time: 1440-1510 OT Time Calculation (min): 30 min Charges:  OT General Charges $OT Visit: 1 Procedure OT Evaluation $OT Eval Moderate Complexity: 1 Procedure G-Codes:       Hortencia Pilar 12/04/2016, 3:39 PM

## 2016-12-04 NOTE — Progress Notes (Signed)
[place] TEAM 1 - Stepdown/ICU Sheryl [name]  WLN:[id number] DOB: [birth year] DOA: 11/28/2016 PCP: Marletta Lor, MD    Brief Narrative:  71 y.o.femalewith history of neurosarcoidosis presently off medications, CLL, fibromyalgia, and hypertension who had low back surgery at [facility] in May 2018 and was brought to [facility] ED from her SNF w/ 24hrs of confusion/AMS.  In the ER periodic episodes of confusion with agitation were noted. The only complaint the patient had was mid back pain around the shoulders. Neck rigidity was noted on exam. The scar over the recent surgery had no active discharge. Lumbar CT showed no definite infection.  The patient required transferred to [facility] 6/6 to allow for MRI with general anesthesia.  Subjective: The pt was agitated this morning, yelling out.  She has improved w/ a dose of haldol.  She is presently sitting up at the side of the bed w/ therapy.  She denies sob, or uncontrolled pain.  She is anxious to be d/c.    Assessment & Plan:  Acute v/s subacute encephalopathy UA/urine cultures negative - chest x-ray unrevealing - CT head negative for acute abnormality - LP not accomplished due to recent lumbar surgery - extensive review of recent history performed by Neurologist suggest this is not a new problem and has been present since early May 2018 - limit all sedating medications - MRI head unrevealing - EEG unrevealing - appears to be near her baseline at this time - stable for return to SNF   Coag neg Staph + blood cx Appears only 1 blood culture was obtained - suspect this represents a contaminant - no clinical evidence of an active infection at this time  Hypernatremia Corrected with volume resuscitation  Acute kidney injury Baseline creatinine 0.9 - resolved with volume resuscitation   Recent Labs Lab 11/29/16 0530 11/30/16 0841 12/01/16 1807 12/02/16 0445 12/04/16 0546  CREATININE 1.66* 0.97 1.01* 1.11* 0.88     HTN Blood pressure controlled   HLD Continue statin   Low normal B-12 levels Continue B-12 supplementation until discharged  Hypomagnesemia Corrected to normal range   Hypokalemia Recurring - cont to replace and check in AM  Neurosarcoidosis F/U as outpatient   Chronic L ear deafness  Chronic blindness R eye   DVT prophylaxis: SCDs Code Status: FULL CODE Family Communication: no family present at time of exam  Disposition Plan: plan for return to [facility] 6/[facility] AM   Consultants:  Neurology   Procedures: none  Antimicrobials:  Acyclovir 6/5 > 6/6 Ampicillin 6/5 > 6/6 Rocephin 6/5 > 6/6 Vancomycin 6/5 > 6/6  Objective: Blood pressure (!) 121/50, pulse 97, temperature 97.5 F (36.4 C), temperature source Oral, resp. rate 18, height 5' (1.524 m), weight 77.9 kg (171 lb 11.8 oz), SpO2 99 %.  Intake/Output Summary (Last 24 hours) at 12/04/16 1441 Last data filed at 12/04/16 1109  Gross per 24 hour  Intake           225.83 ml  Output             1150 ml  Net          -924.17 ml   Filed Weights   11/28/16 2340 11/29/16 0308 12/03/16 2030  Weight: 81.6 kg (180 lb) 69.7 kg (153 lb 10.6 oz) 77.9 kg (171 lb 11.8 oz)    Examination: General: calm and alert at time of my visit  Lungs: CTA th/o - no wheezing  Cardiovascular: RRR Abdomen: Soft, bowel sounds  positive, no rebound, nontender Extremities: trace B LE edema   CBC:  Recent Labs Lab 11/28/16 2021 11/29/16 0530 11/30/16 0841 12/01/16 1807 12/02/16 0445 12/04/16 0546  WBC 7.9 9.0 8.3 8.6 8.5 6.8  NEUTROABS 5.8 7.4 6.5 7.4  --   --   HGB 11.9* 10.4* 11.8* 9.7* 10.2* 10.9*  HCT 36.4 33.1* 36.5 31.5* 33.3* 34.1*  MCV 83.9 84.4 85.5 86.5 86.0 83.4  PLT 235 203 183 198 194 630   Basic Metabolic Panel:  Recent Labs Lab 11/29/16 0530 11/30/16 0841 12/01/16 1807 12/02/16 0445 12/04/16 0546  NA 143 147* 137 140 140  K 4.3 4.1 3.8 3.6 3.2*  CL 112* 119* 108 109 108  CO2 20* 22 21* 24 25   GLUCOSE 135* 146* 112* 96 117*  BUN 44* 21* 24* 28* 17  CREATININE 1.66* 0.97 1.01* 1.11* 0.88  CALCIUM 9.1 8.9 8.4* 8.9 9.2  MG 1.5* 1.8 2.0 2.0  --   PHOS  --   --   --  2.8  --    GFR: Estimated Creatinine Clearance: 56.5 mL/min (by C-G formula based on SCr of 0.88 mg/dL).  Liver Function Tests:  Recent Labs Lab 11/28/16 2021 11/29/16 0530 12/02/16 0445  AST 34 32 17  ALT 20 20 16   ALKPHOS 122 110 91  BILITOT 0.6 0.6 0.4  PROT 6.2* 6.0* 4.5*  ALBUMIN 3.8 3.4* 2.6*    Recent Labs Lab 11/29/16 0530  AMMONIA 12    HbA1C: Hgb A1c MFr Bld  Date/Time Value Ref Range Status  04/17/2012 10:36 AM 5.7 4.6 - 6.5 % Final    Comment:    Glycemic Control Guidelines for People with Diabetes:Non Diabetic:  <6%Goal of Therapy: <7%Additional Action Suggested:  >8%   01/04/2012 10:49 AM 6.4 4.6 - 6.5 % Final    Comment:    Glycemic Control Guidelines for People with Diabetes:Non Diabetic:  <6%Goal of Therapy: <7%Additional Action Suggested:  >8%     CBG:  Recent Labs Lab 12/04/16 0451 12/04/16 0523 12/04/16 0557 12/04/16 0759 12/04/16 1130  GLUCAP 62* 69 117* 103* 75    Recent Results (from the past 240 hour(s))  Culture, blood (routine x 2)     Status: Abnormal   Collection Time: 11/28/16  8:15 PM  Result Value Ref Range Status   Specimen Description BLOOD RIGHT FOREARM  Final   Special Requests   Final    BOTTLES DRAWN AEROBIC AND ANAEROBIC Blood Culture adequate volume   Culture  Setup Time   Final    GRAM POSITIVE COCCI IN CLUSTERS ANAEROBIC BOTTLE ONLY CRITICAL RESULT CALLED TO, READ BACK BY AND VERIFIED WITH: Lavell Luster PHARMD 2224 11/29/16 A BROWNING    Culture (A)  Final    STAPHYLOCOCCUS SPECIES (COAGULASE NEGATIVE) THE SIGNIFICANCE OF ISOLATING THIS ORGANISM FROM A SINGLE SET OF BLOOD CULTURES WHEN MULTIPLE SETS ARE DRAWN IS UNCERTAIN. PLEASE NOTIFY THE MICROBIOLOGY DEPARTMENT WITHIN ONE WEEK IF SPECIATION AND SENSITIVITIES ARE REQUIRED. Performed at  [facility], [place] [address] [place], [place] [address]    Report Status 12/01/2016 FINAL  Final  Urine culture     Status: None   Collection Time: 11/28/16  8:15 PM  Result Value Ref Range Status   Specimen Description URINE, CLEAN CATCH  Final   Special Requests Normal  Final   Culture   Final    NO GROWTH Performed at [facility], [place] [address] [place], [place] [address]    Report  Status 11/30/2016 FINAL  Final  Blood Culture ID Panel (Reflexed)     Status: Abnormal   Collection Time: 11/28/16  8:15 PM  Result Value Ref Range Status   Enterococcus species NOT DETECTED NOT DETECTED Final   Listeria monocytogenes NOT DETECTED NOT DETECTED Final   Staphylococcus species DETECTED (A) NOT DETECTED Final    Comment: Methicillin (oxacillin) susceptible coagulase negative staphylococcus. Possible blood culture contaminant (unless isolated from more than one blood culture draw or clinical case suggests pathogenicity). No antibiotic treatment is indicated for blood  culture contaminants. CRITICAL RESULT CALLED TO, READ BACK BY AND VERIFIED WITH: Lavell Luster PHARMD 2224 11/29/16 A BROWNING    Staphylococcus aureus NOT DETECTED NOT DETECTED Final   Methicillin resistance NOT DETECTED NOT DETECTED Final   Streptococcus species NOT DETECTED NOT DETECTED Final   Streptococcus agalactiae NOT DETECTED NOT DETECTED Final   Streptococcus pneumoniae NOT DETECTED NOT DETECTED Final   Streptococcus pyogenes NOT DETECTED NOT DETECTED Final   Acinetobacter baumannii NOT DETECTED NOT DETECTED Final   Enterobacteriaceae species NOT DETECTED NOT DETECTED Final   Enterobacter cloacae complex NOT DETECTED NOT DETECTED Final   Escherichia coli NOT DETECTED NOT DETECTED Final   Klebsiella oxytoca NOT DETECTED NOT DETECTED Final   Klebsiella pneumoniae NOT DETECTED NOT DETECTED Final   Proteus species NOT DETECTED NOT DETECTED Final   Serratia marcescens NOT DETECTED NOT DETECTED Final    Haemophilus influenzae NOT DETECTED NOT DETECTED Final   Neisseria meningitidis NOT DETECTED NOT DETECTED Final   Pseudomonas aeruginosa NOT DETECTED NOT DETECTED Final   Candida albicans NOT DETECTED NOT DETECTED Final   Candida glabrata NOT DETECTED NOT DETECTED Final   Candida krusei NOT DETECTED NOT DETECTED Final   Candida parapsilosis NOT DETECTED NOT DETECTED Final   Candida tropicalis NOT DETECTED NOT DETECTED Final    Comment: Performed at [facility], [place] [address], [place], [place] [address]  MRSA PCR Screening     Status: None   Collection Time: 11/29/16  4:14 AM  Result Value Ref Range Status   MRSA by PCR NEGATIVE NEGATIVE Final    Comment:        The GeneXpert MRSA Assay (FDA approved for NASAL specimens only), is one component of a comprehensive MRSA colonization surveillance program. It is not intended to diagnose MRSA infection nor to guide or monitor treatment for MRSA infections.      Scheduled Meds: . aspirin EC  81 mg Oral Daily  . brimonidine  1 drop Left Eye TID  . chlorhexidine  15 mL Mouth Rinse BID  . Chlorhexidine Gluconate Cloth  6 each Topical Daily  . cyanocobalamin  1,000 mcg Subcutaneous Daily  . docusate sodium  100 mg Oral BID  . dronabinol  5 mg Oral BID AC  . FLUoxetine  20 mg Oral Daily  . haloperidol lactate  5 mg Intravenous Once  . mouth rinse  15 mL Mouth Rinse q12n4p  . OLANZapine  2.5 mg Oral QHS  . oxybutynin  10 mg Oral QHS  . pantoprazole  40 mg Oral Daily  . polyethylene glycol  17 g Oral Daily  . sodium chloride flush  10-40 mL Intracatheter Q12H     LOS: 5 days   Cherene Altes, MD [facility] Office  [phone] Pager - Text Page per Amion as per below:  On-Call/Text Page:      [email or web address]      password [email or web address]  If 7PM-7AM, please contact night-coverage [email or web address]  Password [email or web address] 12/04/2016, 2:41 PM

## 2016-12-04 NOTE — Evaluation (Signed)
Physical Therapy Evaluation Patient Details Name: Sheryl [name] MRN: [id number] DOB: Dec 03, 1945 Today's Date: 12/04/2016   History of Present Illness   71 yo F with h/o neurosarcoidosis presently off medications, CLL, fibromyalgia, and hypertension who had low back surgery at [facility] in May 2018 and was brought to [facility] ED from her SNF w/ 24hrs of confusion/AMS. 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 Currently mentation improved but still lethargic; mental changes not attributed to neurosarcoidosis.   Clinical Impression  Pt presents with severe limitations to functional mobility due to weakness, decrease joint and spinal ROM, moderate pain with movement, and complicated by lethargy and altered mental function resulting in dependencies in bed mobility, sit to stand transfers; further mobility exam deferred in absence of back brace (will defer ordering new one in anticipation of d/c back to snf per discussion with attending MD).  Recommend sit EOB and stand with RW and physical assist 2-3 times per day to preserve as much function as possible.  PT will see acutely as able and recommend return to SNF to regain function for eventual return to home.      Follow Up Recommendations SNF    Equipment Recommendations  None recommended by PT (should have RW at [facility])    Recommendations for Other Services       Precautions / Restrictions Precautions Precautions: Fall Precaution Comments: bed alarm Required Braces or Orthoses: Spinal Brace (?fusion 10/25/16, no brace here, Charge RN will f/u) Spinal Brace: Lumbar corset;Applied in sitting position (per pt's memory, but no access to postop orders) Spinal Brace Comments: see notes above, without clear postop order, most conservative and safe decision is to delay ambulation until ?corset brace is available either from home/SNF or by ordering new brace.  Discussed with Levada Dy, CRN, who will  f/u Restrictions Weight Bearing Restrictions: No      Mobility  Bed Mobility Overal bed mobility: Needs Assistance Bed Mobility: Rolling;Sidelying to Sit;Sit to Sidelying Rolling: Mod assist Sidelying to sit: Max assist     Sit to sidelying: Max assist General bed mobility comments: with encoragement and practice pt improves from unable to max to moderate assist for rolling using bedpad to assist and maintain neutral spine; once rolling to side, easier to attempt side to sit, needs most help to lift trunk and groans with discomfort; assist to lift legs back to bed and slow trunk descent, ends lying on back aligned diagonally in bed; CNA and PT moved pt to [facility] with bed pad in prep for bed bath  Transfers Overall transfer level: Needs assistance Equipment used: Rolling walker (2 wheeled) Transfers: Sit to/from Stand Sit to Stand: Max assist         General transfer comment: pt need to scoot far to EOB for feet to reach ground; max cues to push up with legs and physical assist to clear bottom from bed and maintain upward motion; mod cues to stand tall, use RW to assist with support and to open eyes/look forward; stands with legs against bed for >60 seconds first time, noting stool on pad and bottom; sat 2-3 min to rest then stood again for CNA to perform pericare before sitting again; needs min assist and encouragement to maintain standing both trials  Ambulation/Gait             General Gait Details: deferred gait due to cognition and to lack of back brace -- see general comments and impression statement  Stairs            Wheelchair Mobility    Modified Rankin (Stroke Patients Only)       Balance Overall balance assessment: Needs assistance Sitting-balance support: Bilateral upper extremity supported;Feet unsupported Sitting balance-Leahy Scale: Fair     Standing balance support: Bilateral upper extremity supported;During functional activity Standing  balance-Leahy Scale: Poor Standing balance comment: dependent on RW and standby/min assist to maintain standing mostly due to weakness vs over balance impairment                             Pertinent Vitals/Pain Pain Assessment: Faces Faces Pain Scale: Hurts little more Pain Location: back Pain Intervention(s): Monitored during session;Limited activity within patient's tolerance;Repositioned    Home Living Family/patient expects to be discharged to:: Skilled nursing facility                 Additional Comments: pt came from SNF, expect d/c back to facility especially if mobility remaims limited as medical conditions improve    Prior Function Level of Independence: Needs assistance (after back surgery 10/25/16 until now; was amb w/RW prior)   Gait / Transfers Assistance Needed: unknown  ADL's / Homemaking Assistance Needed: unknown  Comments: due to pt cognition/alertness unsure of PLOF     Hand Dominance        Extremity/Trunk Assessment   Upper Extremity Assessment Upper Extremity Assessment: Generalized weakness;Defer to OT evaluation    Lower Extremity Assessment Lower Extremity Assessment: Generalized weakness       Communication   Communication: No difficulties  Cognition Arousal/Alertness: Lethargic Behavior During Therapy: Flat affect Overall Cognitive Status: No family/caregiver present to determine baseline cognitive functioning                                 General Comments: earlier this am pt was screaming and agitated.  for this session pt was lethargic but cooperative, sitll insisting she wants/needs to go home, and more alert sitting upright on EOB with near normal conversational skills and eye opened/engaged with PT and answering questions.  becomes less alert and lethargic once returned to supine (CNA to give bed bath right after)      General Comments General comments (skin integrity, edema, etc.): pink dressing to  back; d/w with Dr. Thereasa Solo need for brace out of bed and given likely d/c back to SNF in 1-2 days at most expect pt will not need a new brace (and additional cost associated) and will defer gait but encourage sit EOB and stand with PT/RN for activity    Exercises     Assessment/Plan    PT Assessment Patient needs continued PT services  PT Problem List Decreased strength;Obesity;Decreased cognition;Decreased safety awareness;Decreased mobility;Decreased activity tolerance;Decreased range of motion       PT Treatment Interventions Patient/family education;Therapeutic activities;DME instruction;Functional mobility training    PT Goals (Current goals can be found in the Care Plan section)  Acute Rehab PT Goals Patient Stated Goal: go home PT Goal Formulation: With patient Time For Goal Achievement: 12/11/16 Potential to Achieve Goals: Fair    Frequency Min 3X/week   Barriers to discharge        Co-evaluation               AM-PAC PT "6 Clicks" Daily Activity  Outcome Measure Difficulty turning over in bed (including adjusting bedclothes, sheets  and blankets)?: A Lot Difficulty moving from lying on back to sitting on the side of the bed? : A Lot Difficulty sitting down on and standing up from a chair with arms (e.g., wheelchair, bedside commode, etc,.)?: A Lot Help needed moving to and from a bed to chair (including a wheelchair)?: A Lot Help needed walking in hospital room?: Total Help needed climbing 3-5 steps with a railing? : Total 6 Click Score: 10    End of Session Equipment Utilized During Treatment: Gait belt Activity Tolerance: Patient limited by fatigue;Patient limited by lethargy Patient left: in bed;with nursing/sitter in room Nurse Communication: Mobility status;Precautions PT Visit Diagnosis: Unsteadiness on feet (R26.81);Muscle weakness (generalized) (M62.81)    Time: 6720-9470 PT Time Calculation (min) (ACUTE ONLY): 50 min   Charges:   PT  Evaluation $PT Eval Moderate Complexity: 1 Procedure PT Treatments $Therapeutic Activity: 23-37 mins   PT G Codes:        Kearney Hard, PT, DPT, MS, GCS  Kearney Hard [place] 12/04/2016, 11:09 AM

## 2016-12-05 DIAGNOSIS — G9340 Encephalopathy, unspecified: Secondary | ICD-10-CM | POA: Diagnosis not present

## 2016-12-05 DIAGNOSIS — D869 Sarcoidosis, unspecified: Secondary | ICD-10-CM | POA: Diagnosis not present

## 2016-12-05 DIAGNOSIS — D8689 Sarcoidosis of other sites: Secondary | ICD-10-CM | POA: Diagnosis not present

## 2016-12-05 DIAGNOSIS — R278 Other lack of coordination: Secondary | ICD-10-CM | POA: Diagnosis not present

## 2016-12-05 DIAGNOSIS — R2681 Unsteadiness on feet: Secondary | ICD-10-CM | POA: Diagnosis not present

## 2016-12-05 DIAGNOSIS — M4805 Spinal stenosis, thoracolumbar region: Secondary | ICD-10-CM | POA: Diagnosis not present

## 2016-12-05 DIAGNOSIS — Z5189 Encounter for other specified aftercare: Secondary | ICD-10-CM | POA: Diagnosis not present

## 2016-12-05 DIAGNOSIS — R293 Abnormal posture: Secondary | ICD-10-CM | POA: Diagnosis not present

## 2016-12-05 DIAGNOSIS — H5440 Blindness, one eye, unspecified eye: Secondary | ICD-10-CM | POA: Diagnosis not present

## 2016-12-05 DIAGNOSIS — E785 Hyperlipidemia, unspecified: Secondary | ICD-10-CM | POA: Diagnosis not present

## 2016-12-05 DIAGNOSIS — F29 Unspecified psychosis not due to a substance or known physiological condition: Secondary | ICD-10-CM | POA: Diagnosis not present

## 2016-12-05 DIAGNOSIS — J810 Acute pulmonary edema: Secondary | ICD-10-CM | POA: Diagnosis not present

## 2016-12-05 DIAGNOSIS — I10 Essential (primary) hypertension: Secondary | ICD-10-CM | POA: Diagnosis not present

## 2016-12-05 DIAGNOSIS — F329 Major depressive disorder, single episode, unspecified: Secondary | ICD-10-CM | POA: Diagnosis not present

## 2016-12-05 DIAGNOSIS — R269 Unspecified abnormalities of gait and mobility: Secondary | ICD-10-CM | POA: Diagnosis not present

## 2016-12-05 DIAGNOSIS — R630 Anorexia: Secondary | ICD-10-CM | POA: Diagnosis not present

## 2016-12-05 DIAGNOSIS — L89152 Pressure ulcer of sacral region, stage 2: Secondary | ICD-10-CM | POA: Diagnosis not present

## 2016-12-05 DIAGNOSIS — T8189XD Other complications of procedures, not elsewhere classified, subsequent encounter: Secondary | ICD-10-CM | POA: Diagnosis not present

## 2016-12-05 DIAGNOSIS — M6281 Muscle weakness (generalized): Secondary | ICD-10-CM | POA: Diagnosis not present

## 2016-12-05 DIAGNOSIS — R41841 Cognitive communication deficit: Secondary | ICD-10-CM | POA: Diagnosis not present

## 2016-12-05 LAB — BASIC METABOLIC PANEL
Anion gap: 7 (ref 5–15)
BUN: 15 mg/dL (ref 6–20)
CO2: 27 mmol/L (ref 22–32)
Calcium: 8.8 mg/dL — ABNORMAL LOW (ref 8.9–10.3)
Chloride: 110 mmol/L (ref 101–111)
Creatinine, Ser: 0.94 mg/dL (ref 0.44–1.00)
GFR calc Af Amer: >60 mL/min (ref >60)
GFR calc non Af Amer: >60 mL/min (ref >60)
Glucose, Bld: 103 mg/dL — ABNORMAL HIGH (ref 65–99)
Potassium: 3.6 mmol/L (ref 3.5–5.1)
Sodium: 144 mmol/L (ref 135–145)

## 2016-12-05 LAB — FOLATE RBC: Folate, Hemolysate: 379.8 ng/mL

## 2016-12-05 LAB — GLUCOSE, CAPILLARY: Glucose-Capillary: 91 mg/dL (ref 65–99)

## 2016-12-05 NOTE — Discharge Instructions (Signed)
Toxic Metabolic Encephalopathy °Toxic metabolic encephalopathy (TME) is a type of brain disorder caused by a change in brain chemistry. This condition may result from illnesses or conditions that cause an imbalance of fluid, minerals (electrolytes), and other substances in the body that affect the way the brain functions. It is not caused by brain damage or brain disease. °TME can cause confusion and other mental disturbances, which are generally referred to as delirium. Untreated delirium may lead to permanent mental changes or worsening medical conditions. Untreated delirium is a life-threatening condition that may need to be treated in the hospital. °What are the causes? °Possible causes of TME that can lead to delirium include: °· Short-term (acute) or long-term (chronic) disease of the kidney or liver. °· Not having enough fluid in the body (dehydration). °· Changes in the acid level (pH) of the blood. °· High or low levels of any of the following substances in the blood: °? Calcium. °? Salt (sodium). °? Sugar (glucose). °? Magnesium. °? Phosphate. °· High body temperature. °· Not having enough oxygen in the blood. °· Low levels of B vitamins. This can result from alcohol abuse. °· Certain medicines, such as steroids and medicines that reduce the activity of the immune system (immunosuppressants). °· Certain infections. ° °What increases the risk? °You may have a higher risk for TME if you: °· Are elderly. °· Have dementia. °· Are in the hospital, especially in intensive care. °· Live in a nursing home. °· Had recent surgery. °· Have liver or kidney disease. °· Have poorly controlled diabetes. °· Have chronic medical problems, especially heart or lung disease. °· Are not getting enough fluids. °· Have poor nutrition. °· Abuse alcohol. ° °What are the signs or symptoms? °Symptoms of TME may include: °· Muscle stiffness or jerking (spasticity). °· Shaking (tremors). °· Flapping of the  hands. °· Weakness. °· Clumsiness. °· Slowed breathing. °· Jerky movements that you cannot control (seizures). °· Not being able to stay awake (drowsiness). °· Not being able to pay attention. °· Loss of consciousness (coma). ° °Symptoms of delirium caused by TME include: °· Confusion. °· Difficulty focusing or concentrating, or inability to focus or concentrate. °· Not knowing where you are (disorientation). °· Seeing or hearing things that are not real (hallucinations). °· Fearfulness. °· False beliefs (delusions). °· Changes in mood or personality. °· Changes in speech, such as saying things that do not make sense. °· Memory loss. °· Irritability. °· Avoiding other people (withdrawal). °· Depression. °· Poor judgment. °· Changes in eating and sleeping patterns. °· Hyperactivity. °· Decreased alertness. °· General mistrust of others (paranoia). ° °Delirium may come and go. Symptoms of delirium may start suddenly or gradually, and they often get worse at night. °How is this diagnosed? °This condition is diagnosed based on: °· Your symptoms and behavior. °· An exam to check how you are thinking, feeling, and behaving (mental status exam). To diagnose delirium, the mental status exam must rule out other possible causes of TME, and must show: °? Changes in attention and awareness. °? Changes that develop over a short period of time and tend to come and go (fluctuate). °? Changes in memory, language, and thinking that were not present before. °· A physical exam. °· Imaging tests, such as: °? MRI. °? CT scan. °· Blood tests to: °? Measure liver and kidney function. °? Check for a lack (deficiency) of vitamin B. °? Check for changes in acid levels (pH) and changes in calcium, sodium, or magnesium levels   in the blood. °? Measure your blood sugar (glucose). °? Measure your blood oxygen level. ° °How is this treated? °Treatment for TME depends on the cause, and it may include. °· Getting fluids through an IV  tube. °· Regulating calcium, sodium, glucose, or magnesium levels in the body. °· Getting oxygen. °· Improving nutrition. °· Treating liver or kidney disease. °· Adjusting certain medicines. °· Treating infections. ° °If the cause is found and treated, delirium usually improves. Managing delirium may include: °· Keeping the room well-lit and quiet. °· Using calendars, pictures, and clocks to prevent disorientation. °· Having frequent checks from nursing staff and visits from caregivers. °· Wearing eyeglasses or a hearing aid, if needed. °· Physical therapy. °· Medicine to treat agitation, anxiety, hallucinations, or delusions. ° °Follow these instructions at home: °· Drink enough fluid to keep your urine clear or pale yellow. °· Take over-the-counter and prescription medicines only as told by your health care provider. °· Return to your normal activities as told by your health care provider. Ask your health care provider what activities are safe for you. °· Follow a healthy diet. Do not skip meals. °· Do not drink alcohol. °· Go to bed at the same time every night. °· Keep all follow-up visits as told by your health care provider. This is important. °Contact a health care provider if: °· You are unable to feed yourself or hydrate yourself. °· You need help at home. °· You start to feel clumsy. °· You start to have tremors or weakness. °Get help right away if: °· You have a seizure. °· You lose consciousness. °· You have trouble breathing. °· You do not feel able to care for yourself at home. °· You have a fever. °· You become disoriented at home. °This information is not intended to replace advice given to you by your health care provider. Make sure you discuss any questions you have with your health care provider. °Document Released: 11/20/2015 Document Revised: 02/15/2016 Document Reviewed: 11/20/2015 °Elsevier Interactive Patient Education © 2018 [employer] ° °

## 2016-12-05 NOTE — Progress Notes (Signed)
Discharge to: [place] Anticipated discharge date: 12/05/16 Family notified: Yes, by phone Transportation by: [facility]  Report #: [phone], Room 405  [place] signing off.  Laveda Abbe LCSW [phone]

## 2016-12-05 NOTE — Care Management Note (Signed)
Case Management Note  Patient Details  Name: Sehaj Kolden MRN: [id number] Date of Birth: Jul 10, 1945  Subjective/Objective:                    Action/Plan: Pt discharging to [facility] today. No further needs per CM.  Expected Discharge Date:  12/05/16               Expected Discharge Plan:  Skilled Nursing Facility  In-House Referral:  Clinical Social Work  Discharge planning Services  CM Consult  Post Acute Care Choice:    Choice offered to:     DME Arranged:    DME Agency:     HH Arranged:    [place] Agency:     Status of Service:  Completed, signed off  If discussed at [employer], dates discussed:    Additional Comments:  Pollie Friar, RN 12/05/2016, 1:49 PM

## 2016-12-05 NOTE — Progress Notes (Signed)
Patient is discharged from room 5C17 at this time. Alert and in stable condition. PICC to left upper arm d/c'd by IV team. Report called in to receiving nurse Selinda Orion, RN at [facility] with all questions answered. Left unit via stretcher by PTAR with all belongings at side.

## 2016-12-05 NOTE — Clinical Social Work Placement (Signed)
   CLINICAL SOCIAL WORK PLACEMENT  NOTE  Date:  12/05/2016  Patient Details  Name: Sheryl [name] MRN: [id number] Date of Birth: 15-Dec-1945  Clinical Social Work is seeking post-discharge placement for this patient at the [place] level of care (*CSW will initial, date and re-position this form in  chart as items are completed):      Patient/family provided with [facility] Work Department's list of facilities offering this level of care within the geographic area requested by the patient (or if unable, by the patient's family).      Patient/family informed of their freedom to choose among providers that offer the needed level of care, that participate in Medicare, Medicaid or managed care program needed by the patient, have an available bed and are willing to accept the patient.      Patient/family informed of [facility]'s ownership interest in [facility] and [facility], as well as of the fact that they are under no obligation to receive care at these facilities.  PASRR submitted to EDS on       PASRR number received on       Existing PASRR number confirmed on       FL2 transmitted to all facilities in geographic area requested by pt/family on       FL2 transmitted to all facilities within larger geographic area on       Patient informed that his/her managed care company has contracts with or will negotiate with certain facilities, including the following:            Patient/family informed of bed offers received.  Patient chooses bed at [place], [place]     Physician recommends and patient chooses bed at      Patient to be transferred to [place], [place] on  .  Patient to be transferred to facility by PTAR     Patient family notified on 12/05/16 of transfer.  Name of family member notified:  Herbie Baltimore     PHYSICIAN       Additional Comment:    _______________________________________________ Geralynn Ochs, LCSW 12/05/2016, 2:43 PM

## 2016-12-05 NOTE — Discharge Summary (Signed)
DISCHARGE SUMMARY  Sheryl [name]  MR#: [id number]  DOB:08/20/1945  Date of Admission: 11/28/2016 Date of Discharge: 12/05/2016  Attending Physician:Durell Lofaso T  Patient's TIR:[id number], Doretha Sou, MD  Consults:  Neurology   Disposition: D/C to SNF   Follow-up Appts: Contact information for after-discharge care    Destination    [facility] Follow up.   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  In the ER periodic episodes of confusion with agitation were noted. The only complaint the patient had was mid back pain around the shoulders. Neck rigidity was noted on exam. The scar over the recent surgery had no active discharge. Lumbar CT showed no definite infection.  The patient required transferred to [facility] 6/6 to allow for MRI with general anesthesia.  Hospital Course:  Acute v/s subacute encephalopathy UA/urine cultures negative - chest x-ray unrevealing - CT head negative for acute abnormality - LP not accomplished due to recent lumbar surgery -  extensive review of recent history performed by Neurologist suggested this was NOT actually a new problem and had been present since early May 2018 - limited all sedating medications - MRI head unrevealing - EEG unrevealing - appeared to be near her baseline w/ extensive inpatient evaluation unrevealing - stable for return to SNF - will need frequent encouragement to assure she participates in rehab to the greatest extent possible   Coag neg Staph + blood cx Appears only 1 blood culture was obtained - suspect this represented a contaminant - no clinical evidence of an active infection during this hospital stay   Hypernatremia Corrected with volume resuscitation  Acute kidney injury Baseline creatinine 0.9 - resolved with volume resuscitation - crt 0.94 at time of d/c   HTN Blood pressure controlled at time of d/c   HLD Continue statin   Low normal B-12 levels B-12 administered in loading dose during hospital stay - suggest recheck of B12 in 8-12 weeks   Hypomagnesemia Corrected to normal range   Hypokalemia Supplemented during hospital stay - K+ 3.6 at time of d/c   Neurosarcoidosis F/U as outpatient- not felt to be related to her admission complaints per Neuro   Chronic L ear deafness  Chronic blindness R eye   Allergies as of 12/05/2016      Reactions   Adhesive [tape] Rash   Redness from tape.    Latex Anaphylaxis, Rash, Other (See Comments)   Added based on information entered during case entry, please review and add reactions, type, and severity as needed. Patient cannot confirm this allergy.   Cyclophosphamide Other (See Comments)   Caused diverticulitis.  She would not eat or drink for days, while taking it.  Sulfa Antibiotics    PER MAR      Medication List    STOP taking these medications   albuterol 108 (90 Base) MCG/ACT inhaler Commonly known as:  PROVENTIL HFA;VENTOLIN HFA   cetirizine 10 MG tablet Commonly known as:  ZYRTEC   dronabinol  2.5 MG capsule Commonly known as:  MARINOL   haloperidol 2 MG/ML solution Commonly known as:  HALDOL   hyoscyamine 0.125 MG SL tablet Commonly known as:  LEVSIN SL   lisinopril 40 MG tablet Commonly known as:  PRINIVIL,ZESTRIL   LORazepam 1 MG tablet Commonly known as:  ATIVAN   LORazepam 2 MG/ML injection Commonly known as:  ATIVAN   MYRBETRIQ 50 MG Tb24 tablet Generic drug:  mirabegron ER   omeprazole 20 MG capsule Commonly known as:  PRILOSEC   oxyCODONE-acetaminophen 5-325 MG tablet Commonly known as:  PERCOCET/ROXICET   predniSONE 2.5 MG tablet Commonly known as:  DELTASONE   sodium chloride 0.65 % Soln nasal spray Commonly known as:  OCEAN   traMADol 50 MG tablet Commonly known as:  ULTRAM     TAKE these medications   acetaminophen 325 MG tablet Commonly known as:  TYLENOL Take 650 mg by mouth every 6 (six) hours as needed.   aspirin 81 MG tablet Take 81 mg by mouth daily.   atorvastatin 40 MG tablet Commonly known as:  LIPITOR Take 1 tablet (40 mg total) by mouth daily.   brimonidine 0.15 % ophthalmic solution Commonly known as:  ALPHAGAN Place 1 drop into the left eye 3 (three) times daily.   docusate sodium 100 MG capsule Commonly known as:  COLACE Take 100 mg by mouth 2 (two) times daily.   FLUoxetine 20 MG capsule Commonly known as:  PROZAC Take 20 mg by mouth daily.   fluticasone 50 MCG/ACT nasal spray Commonly known as:  FLONASE Place 1 spray into both nostrils daily as needed for allergies.   OLANZapine 2.5 MG tablet Commonly known as:  ZYPREXA Take 2.5 mg by mouth at bedtime.   oxybutynin 10 MG 24 hr tablet Commonly known as:  DITROPAN-XL Take 10 mg by mouth at bedtime.   pantoprazole 40 MG tablet Commonly known as:  PROTONIX Take 40 mg by mouth daily.   polyethylene glycol packet Commonly known as:  MIRALAX / GLYCOLAX Take 17 g by mouth daily.   Vitamin D3 2000 units Tabs Take 1 tablet by mouth daily.       Day of  Discharge BP (!) 121/45 (BP Location: Right Arm)   Pulse 75   Temp 97.9 F (36.6 C) (Oral)   Resp 18   Ht 5' (1.524 m)   Wt 77.9 kg (171 lb 11.8 oz)   SpO2 98%   BMI 33.54 kg/m   Physical Exam: General: No acute respiratory distress Lungs: Clear to auscultation bilaterally without wheezes or crackles Cardiovascular: Regular rate and rhythm without murmur  Abdomen: Nontender, nondistended, soft, bowel sounds positive, no rebound, no ascites, no appreciable mass Extremities: trace edema B LE - superficial benign appearing bullae at area of R wrist most c/w skin injury related to tape w/o evidence to suggest an infection   Basic Metabolic Panel:  Recent Labs Lab 11/29/16 0530 11/30/16 0841 12/01/16 1807 12/02/16 0445 12/04/16 0546 12/05/16 0152  NA 143 147* 137 140 140 144  K 4.3 4.1 3.8 3.6 3.2* 3.6  CL 112* 119* 108 109 108 110  CO2 20* 22 21* 24 25 27   GLUCOSE 135* 146* 112*  96 117* 103*  BUN 44* 21* 24* 28* 17 15  CREATININE 1.66* 0.97 1.01* 1.11* 0.88 0.94  CALCIUM 9.1 8.9 8.4* 8.9 9.2 8.8*  MG 1.5* 1.8 2.0 2.0  --   --   PHOS  --   --   --  2.8  --   --     Liver Function Tests:  Recent Labs Lab 11/28/16 2021 11/29/16 0530 12/02/16 0445  AST 34 32 17  ALT 20 20 16   ALKPHOS 122 110 91  BILITOT 0.6 0.6 0.4  PROT 6.2* 6.0* 4.5*  ALBUMIN 3.8 3.4* 2.6*    Recent Labs Lab 11/29/16 0530  AMMONIA 12   CBC:  Recent Labs Lab 11/28/16 2021 11/29/16 0530 11/30/16 0841 12/01/16 1807 12/02/16 0445 12/04/16 0546  WBC 7.9 9.0 8.3 8.6 8.5 6.8  NEUTROABS 5.8 7.4 6.5 7.4  --   --   HGB 11.9* 10.4* 11.8* 9.7* 10.2* 10.9*  HCT 36.4 33.1* 36.5 31.5* 33.3* 34.1*  MCV 83.9 84.4 85.5 86.5 86.0 83.4  PLT 235 203 183 198 194 172    CBG:  Recent Labs Lab 12/04/16 1130 12/04/16 1625 12/04/16 1957 12/04/16 2319 12/05/16 0507  GLUCAP 75 93 106* 99 91    Recent Results (from the past 240 hour(s))  Culture, blood (routine x 2)     Status: Abnormal    Collection Time: 11/28/16  8:15 PM  Result Value Ref Range Status   Specimen Description BLOOD RIGHT FOREARM  Final   Special Requests   Final    BOTTLES DRAWN AEROBIC AND ANAEROBIC Blood Culture adequate volume   Culture  Setup Time   Final    GRAM POSITIVE COCCI IN CLUSTERS ANAEROBIC BOTTLE ONLY CRITICAL RESULT CALLED TO, READ BACK BY AND VERIFIED WITH: Lavell Luster PHARMD 2224 11/29/16 A BROWNING    Culture (A)  Final    STAPHYLOCOCCUS SPECIES (COAGULASE NEGATIVE) THE SIGNIFICANCE OF ISOLATING THIS ORGANISM FROM A SINGLE SET OF BLOOD CULTURES WHEN MULTIPLE SETS ARE DRAWN IS UNCERTAIN. PLEASE NOTIFY THE MICROBIOLOGY DEPARTMENT WITHIN ONE WEEK IF SPECIATION AND SENSITIVITIES ARE REQUIRED. Performed at [facility], [place] [address] [place], [place] [address]    Report Status 12/01/2016 FINAL  Final  Urine culture     Status: None   Collection Time: 11/28/16  8:15 PM  Result Value Ref Range Status   Specimen Description URINE, CLEAN CATCH  Final   Special Requests Normal  Final   Culture   Final    NO GROWTH Performed at [facility], [place] [address] [place], [place] [address]    Report Status 11/30/2016 FINAL  Final  Blood Culture ID Panel (Reflexed)     Status: Abnormal   Collection Time: 11/28/16  8:15 PM  Result Value Ref Range Status   Enterococcus species NOT DETECTED NOT DETECTED Final   Listeria monocytogenes NOT DETECTED NOT DETECTED Final   Staphylococcus species DETECTED (A) NOT DETECTED Final    Comment: Methicillin (oxacillin) susceptible coagulase negative staphylococcus. Possible blood culture contaminant (unless isolated from more than one blood culture draw or clinical case suggests pathogenicity). No antibiotic treatment is indicated for blood  culture contaminants. CRITICAL RESULT CALLED TO, READ BACK BY AND VERIFIED WITH: Lavell Luster PHARMD 2224 11/29/16 A BROWNING    Staphylococcus aureus NOT DETECTED NOT DETECTED Final   Methicillin resistance NOT  DETECTED NOT DETECTED Final   Streptococcus species NOT DETECTED NOT DETECTED Final   Streptococcus agalactiae NOT DETECTED NOT DETECTED Final  Streptococcus pneumoniae NOT DETECTED NOT DETECTED Final   Streptococcus pyogenes NOT DETECTED NOT DETECTED Final   Acinetobacter baumannii NOT DETECTED NOT DETECTED Final   Enterobacteriaceae species NOT DETECTED NOT DETECTED Final   Enterobacter cloacae complex NOT DETECTED NOT DETECTED Final   Escherichia coli NOT DETECTED NOT DETECTED Final   Klebsiella oxytoca NOT DETECTED NOT DETECTED Final   Klebsiella pneumoniae NOT DETECTED NOT DETECTED Final   Proteus species NOT DETECTED NOT DETECTED Final   Serratia marcescens NOT DETECTED NOT DETECTED Final   Haemophilus influenzae NOT DETECTED NOT DETECTED Final   Neisseria meningitidis NOT DETECTED NOT DETECTED Final   Pseudomonas aeruginosa NOT DETECTED NOT DETECTED Final   Candida albicans NOT DETECTED NOT DETECTED Final   Candida glabrata NOT DETECTED NOT DETECTED Final   Candida krusei NOT DETECTED NOT DETECTED Final   Candida parapsilosis NOT DETECTED NOT DETECTED Final   Candida tropicalis NOT DETECTED NOT DETECTED Final    Comment: Performed at [facility], [place] [address] [place], [place] [address]  MRSA PCR Screening     Status: None   Collection Time: 11/29/16  4:14 AM  Result Value Ref Range Status   MRSA by PCR NEGATIVE NEGATIVE Final    Comment:        The GeneXpert MRSA Assay (FDA approved for NASAL specimens only), is one component of a comprehensive MRSA colonization surveillance program. It is not intended to diagnose MRSA infection nor to guide or monitor treatment for MRSA infections.      Time spent in discharge (includes decision making & examination of pt): 35 minutes  12/05/2016, 10:52 AM   Cherene Altes, MD [facility] Office  [phone] Pager [phone]  On-Call/Text Page:      [email or web address]      password [facility]

## 2016-12-06 DIAGNOSIS — R269 Unspecified abnormalities of gait and mobility: Secondary | ICD-10-CM | POA: Diagnosis not present

## 2016-12-06 DIAGNOSIS — E785 Hyperlipidemia, unspecified: Secondary | ICD-10-CM | POA: Diagnosis not present

## 2016-12-06 DIAGNOSIS — T8189XD Other complications of procedures, not elsewhere classified, subsequent encounter: Secondary | ICD-10-CM | POA: Diagnosis not present

## 2016-12-06 DIAGNOSIS — G9340 Encephalopathy, unspecified: Secondary | ICD-10-CM | POA: Diagnosis not present

## 2016-12-06 DIAGNOSIS — I10 Essential (primary) hypertension: Secondary | ICD-10-CM | POA: Diagnosis not present

## 2016-12-06 DIAGNOSIS — D869 Sarcoidosis, unspecified: Secondary | ICD-10-CM | POA: Diagnosis not present

## 2016-12-06 DIAGNOSIS — H5440 Blindness, one eye, unspecified eye: Secondary | ICD-10-CM | POA: Diagnosis not present

## 2016-12-06 DIAGNOSIS — L89152 Pressure ulcer of sacral region, stage 2: Secondary | ICD-10-CM | POA: Diagnosis not present

## 2016-12-13 DIAGNOSIS — L89152 Pressure ulcer of sacral region, stage 2: Secondary | ICD-10-CM | POA: Diagnosis not present

## 2016-12-13 DIAGNOSIS — T8189XD Other complications of procedures, not elsewhere classified, subsequent encounter: Secondary | ICD-10-CM | POA: Diagnosis not present

## 2016-12-15 DIAGNOSIS — R630 Anorexia: Secondary | ICD-10-CM | POA: Diagnosis not present

## 2016-12-15 DIAGNOSIS — F329 Major depressive disorder, single episode, unspecified: Secondary | ICD-10-CM | POA: Diagnosis not present

## 2016-12-15 DIAGNOSIS — F29 Unspecified psychosis not due to a substance or known physiological condition: Secondary | ICD-10-CM | POA: Diagnosis not present

## 2016-12-21 DIAGNOSIS — M4800 Spinal stenosis, site unspecified: Secondary | ICD-10-CM | POA: Diagnosis not present

## 2016-12-22 DIAGNOSIS — R41841 Cognitive communication deficit: Secondary | ICD-10-CM | POA: Diagnosis not present

## 2016-12-22 DIAGNOSIS — G9341 Metabolic encephalopathy: Secondary | ICD-10-CM | POA: Diagnosis not present

## 2016-12-22 DIAGNOSIS — E538 Deficiency of other specified B group vitamins: Secondary | ICD-10-CM | POA: Diagnosis not present

## 2016-12-22 DIAGNOSIS — E785 Hyperlipidemia, unspecified: Secondary | ICD-10-CM | POA: Diagnosis not present

## 2016-12-22 DIAGNOSIS — Z4789 Encounter for other orthopedic aftercare: Secondary | ICD-10-CM | POA: Diagnosis not present

## 2016-12-22 DIAGNOSIS — R531 Weakness: Secondary | ICD-10-CM | POA: Diagnosis not present

## 2016-12-22 DIAGNOSIS — R2689 Other abnormalities of gait and mobility: Secondary | ICD-10-CM | POA: Diagnosis not present

## 2016-12-22 DIAGNOSIS — Z131 Encounter for screening for diabetes mellitus: Secondary | ICD-10-CM | POA: Diagnosis not present

## 2016-12-22 DIAGNOSIS — M6281 Muscle weakness (generalized): Secondary | ICD-10-CM | POA: Diagnosis not present

## 2016-12-22 DIAGNOSIS — N179 Acute kidney failure, unspecified: Secondary | ICD-10-CM | POA: Diagnosis not present

## 2016-12-22 DIAGNOSIS — Z981 Arthrodesis status: Secondary | ICD-10-CM | POA: Diagnosis not present

## 2016-12-22 DIAGNOSIS — I10 Essential (primary) hypertension: Secondary | ICD-10-CM | POA: Diagnosis not present

## 2016-12-22 DIAGNOSIS — Z856 Personal history of leukemia: Secondary | ICD-10-CM | POA: Diagnosis not present

## 2016-12-22 DIAGNOSIS — H9192 Unspecified hearing loss, left ear: Secondary | ICD-10-CM | POA: Diagnosis not present

## 2016-12-22 DIAGNOSIS — G9340 Encephalopathy, unspecified: Secondary | ICD-10-CM | POA: Diagnosis not present

## 2016-12-22 DIAGNOSIS — E870 Hyperosmolality and hypernatremia: Secondary | ICD-10-CM | POA: Diagnosis not present

## 2016-12-23 DIAGNOSIS — R2689 Other abnormalities of gait and mobility: Secondary | ICD-10-CM | POA: Diagnosis not present

## 2016-12-23 DIAGNOSIS — H9192 Unspecified hearing loss, left ear: Secondary | ICD-10-CM | POA: Diagnosis not present

## 2016-12-23 DIAGNOSIS — I10 Essential (primary) hypertension: Secondary | ICD-10-CM | POA: Diagnosis not present

## 2016-12-23 DIAGNOSIS — Z4789 Encounter for other orthopedic aftercare: Secondary | ICD-10-CM | POA: Diagnosis not present

## 2016-12-23 DIAGNOSIS — Z981 Arthrodesis status: Secondary | ICD-10-CM | POA: Diagnosis not present

## 2016-12-23 DIAGNOSIS — R41841 Cognitive communication deficit: Secondary | ICD-10-CM | POA: Diagnosis not present

## 2016-12-27 DIAGNOSIS — R2689 Other abnormalities of gait and mobility: Secondary | ICD-10-CM | POA: Diagnosis not present

## 2016-12-27 DIAGNOSIS — H9192 Unspecified hearing loss, left ear: Secondary | ICD-10-CM | POA: Diagnosis not present

## 2016-12-27 DIAGNOSIS — T8189XD Other complications of procedures, not elsewhere classified, subsequent encounter: Secondary | ICD-10-CM | POA: Diagnosis not present

## 2016-12-27 DIAGNOSIS — L89152 Pressure ulcer of sacral region, stage 2: Secondary | ICD-10-CM | POA: Diagnosis not present

## 2016-12-27 DIAGNOSIS — I10 Essential (primary) hypertension: Secondary | ICD-10-CM | POA: Diagnosis not present

## 2016-12-27 DIAGNOSIS — Z4789 Encounter for other orthopedic aftercare: Secondary | ICD-10-CM | POA: Diagnosis not present

## 2016-12-27 DIAGNOSIS — R41841 Cognitive communication deficit: Secondary | ICD-10-CM | POA: Diagnosis not present

## 2016-12-27 DIAGNOSIS — Z981 Arthrodesis status: Secondary | ICD-10-CM | POA: Diagnosis not present

## 2016-12-29 DIAGNOSIS — H9192 Unspecified hearing loss, left ear: Secondary | ICD-10-CM | POA: Diagnosis not present

## 2016-12-29 DIAGNOSIS — Z981 Arthrodesis status: Secondary | ICD-10-CM | POA: Diagnosis not present

## 2016-12-29 DIAGNOSIS — I10 Essential (primary) hypertension: Secondary | ICD-10-CM | POA: Diagnosis not present

## 2016-12-29 DIAGNOSIS — R2689 Other abnormalities of gait and mobility: Secondary | ICD-10-CM | POA: Diagnosis not present

## 2016-12-29 DIAGNOSIS — Z4789 Encounter for other orthopedic aftercare: Secondary | ICD-10-CM | POA: Diagnosis not present

## 2016-12-29 DIAGNOSIS — R41841 Cognitive communication deficit: Secondary | ICD-10-CM | POA: Diagnosis not present

## 2017-01-02 DIAGNOSIS — R2689 Other abnormalities of gait and mobility: Secondary | ICD-10-CM | POA: Diagnosis not present

## 2017-01-02 DIAGNOSIS — Z981 Arthrodesis status: Secondary | ICD-10-CM | POA: Diagnosis not present

## 2017-01-02 DIAGNOSIS — Z4789 Encounter for other orthopedic aftercare: Secondary | ICD-10-CM | POA: Diagnosis not present

## 2017-01-02 DIAGNOSIS — R41841 Cognitive communication deficit: Secondary | ICD-10-CM | POA: Diagnosis not present

## 2017-01-02 DIAGNOSIS — I10 Essential (primary) hypertension: Secondary | ICD-10-CM | POA: Diagnosis not present

## 2017-01-02 DIAGNOSIS — H9192 Unspecified hearing loss, left ear: Secondary | ICD-10-CM | POA: Diagnosis not present

## 2017-01-03 DIAGNOSIS — H9192 Unspecified hearing loss, left ear: Secondary | ICD-10-CM | POA: Diagnosis not present

## 2017-01-03 DIAGNOSIS — R2689 Other abnormalities of gait and mobility: Secondary | ICD-10-CM | POA: Diagnosis not present

## 2017-01-03 DIAGNOSIS — Z981 Arthrodesis status: Secondary | ICD-10-CM | POA: Diagnosis not present

## 2017-01-03 DIAGNOSIS — I10 Essential (primary) hypertension: Secondary | ICD-10-CM | POA: Diagnosis not present

## 2017-01-03 DIAGNOSIS — R41841 Cognitive communication deficit: Secondary | ICD-10-CM | POA: Diagnosis not present

## 2017-01-03 DIAGNOSIS — Z4789 Encounter for other orthopedic aftercare: Secondary | ICD-10-CM | POA: Diagnosis not present

## 2017-01-04 DIAGNOSIS — R41841 Cognitive communication deficit: Secondary | ICD-10-CM | POA: Diagnosis not present

## 2017-01-04 DIAGNOSIS — Z4789 Encounter for other orthopedic aftercare: Secondary | ICD-10-CM | POA: Diagnosis not present

## 2017-01-04 DIAGNOSIS — H9192 Unspecified hearing loss, left ear: Secondary | ICD-10-CM | POA: Diagnosis not present

## 2017-01-04 DIAGNOSIS — I10 Essential (primary) hypertension: Secondary | ICD-10-CM | POA: Diagnosis not present

## 2017-01-04 DIAGNOSIS — R2689 Other abnormalities of gait and mobility: Secondary | ICD-10-CM | POA: Diagnosis not present

## 2017-01-04 DIAGNOSIS — Z981 Arthrodesis status: Secondary | ICD-10-CM | POA: Diagnosis not present

## 2017-01-05 DIAGNOSIS — H9192 Unspecified hearing loss, left ear: Secondary | ICD-10-CM | POA: Diagnosis not present

## 2017-01-05 DIAGNOSIS — R41841 Cognitive communication deficit: Secondary | ICD-10-CM | POA: Diagnosis not present

## 2017-01-05 DIAGNOSIS — I10 Essential (primary) hypertension: Secondary | ICD-10-CM | POA: Diagnosis not present

## 2017-01-05 DIAGNOSIS — Z981 Arthrodesis status: Secondary | ICD-10-CM | POA: Diagnosis not present

## 2017-01-05 DIAGNOSIS — Z4789 Encounter for other orthopedic aftercare: Secondary | ICD-10-CM | POA: Diagnosis not present

## 2017-01-05 DIAGNOSIS — R2689 Other abnormalities of gait and mobility: Secondary | ICD-10-CM | POA: Diagnosis not present

## 2017-01-06 DIAGNOSIS — Z981 Arthrodesis status: Secondary | ICD-10-CM | POA: Diagnosis not present

## 2017-01-06 DIAGNOSIS — R2689 Other abnormalities of gait and mobility: Secondary | ICD-10-CM | POA: Diagnosis not present

## 2017-01-06 DIAGNOSIS — H9192 Unspecified hearing loss, left ear: Secondary | ICD-10-CM | POA: Diagnosis not present

## 2017-01-06 DIAGNOSIS — R41841 Cognitive communication deficit: Secondary | ICD-10-CM | POA: Diagnosis not present

## 2017-01-06 DIAGNOSIS — Z4789 Encounter for other orthopedic aftercare: Secondary | ICD-10-CM | POA: Diagnosis not present

## 2017-01-06 DIAGNOSIS — I10 Essential (primary) hypertension: Secondary | ICD-10-CM | POA: Diagnosis not present

## 2017-01-09 DIAGNOSIS — G9341 Metabolic encephalopathy: Secondary | ICD-10-CM | POA: Diagnosis not present

## 2017-01-09 DIAGNOSIS — N179 Acute kidney failure, unspecified: Secondary | ICD-10-CM | POA: Diagnosis not present

## 2017-01-09 DIAGNOSIS — R5383 Other fatigue: Secondary | ICD-10-CM | POA: Diagnosis not present

## 2017-01-09 DIAGNOSIS — Z4789 Encounter for other orthopedic aftercare: Secondary | ICD-10-CM | POA: Diagnosis not present

## 2017-01-09 DIAGNOSIS — R2689 Other abnormalities of gait and mobility: Secondary | ICD-10-CM | POA: Diagnosis not present

## 2017-01-09 DIAGNOSIS — R609 Edema, unspecified: Secondary | ICD-10-CM | POA: Diagnosis not present

## 2017-01-09 DIAGNOSIS — H9192 Unspecified hearing loss, left ear: Secondary | ICD-10-CM | POA: Diagnosis not present

## 2017-01-09 DIAGNOSIS — R41841 Cognitive communication deficit: Secondary | ICD-10-CM | POA: Diagnosis not present

## 2017-01-09 DIAGNOSIS — Z981 Arthrodesis status: Secondary | ICD-10-CM | POA: Diagnosis not present

## 2017-01-09 DIAGNOSIS — I10 Essential (primary) hypertension: Secondary | ICD-10-CM | POA: Diagnosis not present

## 2017-01-10 DIAGNOSIS — Z4789 Encounter for other orthopedic aftercare: Secondary | ICD-10-CM | POA: Diagnosis not present

## 2017-01-10 DIAGNOSIS — H9192 Unspecified hearing loss, left ear: Secondary | ICD-10-CM | POA: Diagnosis not present

## 2017-01-10 DIAGNOSIS — Z981 Arthrodesis status: Secondary | ICD-10-CM | POA: Diagnosis not present

## 2017-01-10 DIAGNOSIS — R2689 Other abnormalities of gait and mobility: Secondary | ICD-10-CM | POA: Diagnosis not present

## 2017-01-10 DIAGNOSIS — I10 Essential (primary) hypertension: Secondary | ICD-10-CM | POA: Diagnosis not present

## 2017-01-10 DIAGNOSIS — R41841 Cognitive communication deficit: Secondary | ICD-10-CM | POA: Diagnosis not present

## 2017-01-11 DIAGNOSIS — E876 Hypokalemia: Secondary | ICD-10-CM | POA: Diagnosis not present

## 2017-01-12 DIAGNOSIS — M546 Pain in thoracic spine: Secondary | ICD-10-CM | POA: Diagnosis not present

## 2017-01-12 DIAGNOSIS — R41841 Cognitive communication deficit: Secondary | ICD-10-CM | POA: Diagnosis not present

## 2017-01-12 DIAGNOSIS — I10 Essential (primary) hypertension: Secondary | ICD-10-CM | POA: Diagnosis not present

## 2017-01-12 DIAGNOSIS — R2689 Other abnormalities of gait and mobility: Secondary | ICD-10-CM | POA: Diagnosis not present

## 2017-01-12 DIAGNOSIS — H9192 Unspecified hearing loss, left ear: Secondary | ICD-10-CM | POA: Diagnosis not present

## 2017-01-12 DIAGNOSIS — Z4789 Encounter for other orthopedic aftercare: Secondary | ICD-10-CM | POA: Diagnosis not present

## 2017-01-12 DIAGNOSIS — Z981 Arthrodesis status: Secondary | ICD-10-CM | POA: Diagnosis not present

## 2017-01-13 ENCOUNTER — Ambulatory Visit: Payer: Medicare Other | Admitting: Internal Medicine

## 2017-01-16 DIAGNOSIS — R41841 Cognitive communication deficit: Secondary | ICD-10-CM | POA: Diagnosis not present

## 2017-01-16 DIAGNOSIS — R2689 Other abnormalities of gait and mobility: Secondary | ICD-10-CM | POA: Diagnosis not present

## 2017-01-16 DIAGNOSIS — H9192 Unspecified hearing loss, left ear: Secondary | ICD-10-CM | POA: Diagnosis not present

## 2017-01-16 DIAGNOSIS — I10 Essential (primary) hypertension: Secondary | ICD-10-CM | POA: Diagnosis not present

## 2017-01-16 DIAGNOSIS — Z981 Arthrodesis status: Secondary | ICD-10-CM | POA: Diagnosis not present

## 2017-01-16 DIAGNOSIS — Z4789 Encounter for other orthopedic aftercare: Secondary | ICD-10-CM | POA: Diagnosis not present

## 2017-01-18 DIAGNOSIS — Z981 Arthrodesis status: Secondary | ICD-10-CM | POA: Diagnosis not present

## 2017-01-18 DIAGNOSIS — H9192 Unspecified hearing loss, left ear: Secondary | ICD-10-CM | POA: Diagnosis not present

## 2017-01-18 DIAGNOSIS — R41841 Cognitive communication deficit: Secondary | ICD-10-CM | POA: Diagnosis not present

## 2017-01-18 DIAGNOSIS — Z4789 Encounter for other orthopedic aftercare: Secondary | ICD-10-CM | POA: Diagnosis not present

## 2017-01-18 DIAGNOSIS — I10 Essential (primary) hypertension: Secondary | ICD-10-CM | POA: Diagnosis not present

## 2017-01-18 DIAGNOSIS — R2689 Other abnormalities of gait and mobility: Secondary | ICD-10-CM | POA: Diagnosis not present

## 2017-01-20 DIAGNOSIS — Z981 Arthrodesis status: Secondary | ICD-10-CM | POA: Diagnosis not present

## 2017-01-20 DIAGNOSIS — R41841 Cognitive communication deficit: Secondary | ICD-10-CM | POA: Diagnosis not present

## 2017-01-20 DIAGNOSIS — Z4789 Encounter for other orthopedic aftercare: Secondary | ICD-10-CM | POA: Diagnosis not present

## 2017-01-20 DIAGNOSIS — H9192 Unspecified hearing loss, left ear: Secondary | ICD-10-CM | POA: Diagnosis not present

## 2017-01-20 DIAGNOSIS — I10 Essential (primary) hypertension: Secondary | ICD-10-CM | POA: Diagnosis not present

## 2017-01-20 DIAGNOSIS — R2689 Other abnormalities of gait and mobility: Secondary | ICD-10-CM | POA: Diagnosis not present

## 2017-01-23 DIAGNOSIS — N179 Acute kidney failure, unspecified: Secondary | ICD-10-CM | POA: Diagnosis not present

## 2017-01-23 DIAGNOSIS — I10 Essential (primary) hypertension: Secondary | ICD-10-CM | POA: Diagnosis not present

## 2017-01-23 DIAGNOSIS — F329 Major depressive disorder, single episode, unspecified: Secondary | ICD-10-CM | POA: Diagnosis not present

## 2017-01-25 DIAGNOSIS — Z4789 Encounter for other orthopedic aftercare: Secondary | ICD-10-CM | POA: Diagnosis not present

## 2017-01-25 DIAGNOSIS — Z981 Arthrodesis status: Secondary | ICD-10-CM | POA: Diagnosis not present

## 2017-01-25 DIAGNOSIS — R2689 Other abnormalities of gait and mobility: Secondary | ICD-10-CM | POA: Diagnosis not present

## 2017-01-25 DIAGNOSIS — R41841 Cognitive communication deficit: Secondary | ICD-10-CM | POA: Diagnosis not present

## 2017-01-25 DIAGNOSIS — I10 Essential (primary) hypertension: Secondary | ICD-10-CM | POA: Diagnosis not present

## 2017-01-25 DIAGNOSIS — H9192 Unspecified hearing loss, left ear: Secondary | ICD-10-CM | POA: Diagnosis not present

## 2017-01-27 DIAGNOSIS — Z981 Arthrodesis status: Secondary | ICD-10-CM | POA: Diagnosis not present

## 2017-01-27 DIAGNOSIS — R2689 Other abnormalities of gait and mobility: Secondary | ICD-10-CM | POA: Diagnosis not present

## 2017-01-27 DIAGNOSIS — R41841 Cognitive communication deficit: Secondary | ICD-10-CM | POA: Diagnosis not present

## 2017-01-27 DIAGNOSIS — Z4789 Encounter for other orthopedic aftercare: Secondary | ICD-10-CM | POA: Diagnosis not present

## 2017-01-27 DIAGNOSIS — I10 Essential (primary) hypertension: Secondary | ICD-10-CM | POA: Diagnosis not present

## 2017-01-27 DIAGNOSIS — H9192 Unspecified hearing loss, left ear: Secondary | ICD-10-CM | POA: Diagnosis not present

## 2017-01-30 DIAGNOSIS — Z4789 Encounter for other orthopedic aftercare: Secondary | ICD-10-CM | POA: Diagnosis not present

## 2017-01-30 DIAGNOSIS — R2689 Other abnormalities of gait and mobility: Secondary | ICD-10-CM | POA: Diagnosis not present

## 2017-01-30 DIAGNOSIS — Z981 Arthrodesis status: Secondary | ICD-10-CM | POA: Diagnosis not present

## 2017-01-30 DIAGNOSIS — I10 Essential (primary) hypertension: Secondary | ICD-10-CM | POA: Diagnosis not present

## 2017-01-30 DIAGNOSIS — H9192 Unspecified hearing loss, left ear: Secondary | ICD-10-CM | POA: Diagnosis not present

## 2017-01-30 DIAGNOSIS — R531 Weakness: Secondary | ICD-10-CM | POA: Diagnosis not present

## 2017-01-30 DIAGNOSIS — R41841 Cognitive communication deficit: Secondary | ICD-10-CM | POA: Diagnosis not present

## 2017-02-01 DIAGNOSIS — Z981 Arthrodesis status: Secondary | ICD-10-CM | POA: Diagnosis not present

## 2017-02-01 DIAGNOSIS — I10 Essential (primary) hypertension: Secondary | ICD-10-CM | POA: Diagnosis not present

## 2017-02-01 DIAGNOSIS — R2689 Other abnormalities of gait and mobility: Secondary | ICD-10-CM | POA: Diagnosis not present

## 2017-02-01 DIAGNOSIS — H9192 Unspecified hearing loss, left ear: Secondary | ICD-10-CM | POA: Diagnosis not present

## 2017-02-01 DIAGNOSIS — R41841 Cognitive communication deficit: Secondary | ICD-10-CM | POA: Diagnosis not present

## 2017-02-01 DIAGNOSIS — Z4789 Encounter for other orthopedic aftercare: Secondary | ICD-10-CM | POA: Diagnosis not present

## 2017-02-02 DIAGNOSIS — R41841 Cognitive communication deficit: Secondary | ICD-10-CM | POA: Diagnosis not present

## 2017-02-02 DIAGNOSIS — Z4789 Encounter for other orthopedic aftercare: Secondary | ICD-10-CM | POA: Diagnosis not present

## 2017-02-02 DIAGNOSIS — Z981 Arthrodesis status: Secondary | ICD-10-CM | POA: Diagnosis not present

## 2017-02-02 DIAGNOSIS — H9192 Unspecified hearing loss, left ear: Secondary | ICD-10-CM | POA: Diagnosis not present

## 2017-02-02 DIAGNOSIS — I10 Essential (primary) hypertension: Secondary | ICD-10-CM | POA: Diagnosis not present

## 2017-02-02 DIAGNOSIS — R2689 Other abnormalities of gait and mobility: Secondary | ICD-10-CM | POA: Diagnosis not present

## 2017-02-03 DIAGNOSIS — R41841 Cognitive communication deficit: Secondary | ICD-10-CM | POA: Diagnosis not present

## 2017-02-03 DIAGNOSIS — R2689 Other abnormalities of gait and mobility: Secondary | ICD-10-CM | POA: Diagnosis not present

## 2017-02-03 DIAGNOSIS — H9192 Unspecified hearing loss, left ear: Secondary | ICD-10-CM | POA: Diagnosis not present

## 2017-02-03 DIAGNOSIS — Z981 Arthrodesis status: Secondary | ICD-10-CM | POA: Diagnosis not present

## 2017-02-03 DIAGNOSIS — Z4789 Encounter for other orthopedic aftercare: Secondary | ICD-10-CM | POA: Diagnosis not present

## 2017-02-03 DIAGNOSIS — I10 Essential (primary) hypertension: Secondary | ICD-10-CM | POA: Diagnosis not present

## 2017-02-06 ENCOUNTER — Ambulatory Visit (INDEPENDENT_AMBULATORY_CARE_PROVIDER_SITE_OTHER): Payer: Medicare Other | Admitting: Internal Medicine

## 2017-02-06 ENCOUNTER — Encounter: Payer: Self-pay | Admitting: Internal Medicine

## 2017-02-06 VITALS — BP 132/80 | HR 80 | Temp 98.1°F | Ht 60.0 in | Wt 156.0 lb

## 2017-02-06 DIAGNOSIS — H469 Unspecified optic neuritis: Secondary | ICD-10-CM | POA: Diagnosis not present

## 2017-02-06 DIAGNOSIS — D8689 Sarcoidosis of other sites: Secondary | ICD-10-CM | POA: Diagnosis not present

## 2017-02-06 DIAGNOSIS — Z0279 Encounter for issue of other medical certificate: Secondary | ICD-10-CM | POA: Diagnosis not present

## 2017-02-06 DIAGNOSIS — C9190 Lymphoid leukemia, unspecified not having achieved remission: Secondary | ICD-10-CM

## 2017-02-06 DIAGNOSIS — I10 Essential (primary) hypertension: Secondary | ICD-10-CM | POA: Diagnosis not present

## 2017-02-06 DIAGNOSIS — Z0189 Encounter for other specified special examinations: Secondary | ICD-10-CM

## 2017-02-06 DIAGNOSIS — C9110 Chronic lymphocytic leukemia of B-cell type not having achieved remission: Secondary | ICD-10-CM

## 2017-02-06 MED ORDER — VITAMIN B-12 1000 MCG PO TABS: 1000.0000 ug | ORAL_TABLET | Freq: Every day | ORAL | Status: DC

## 2017-02-06 NOTE — Patient Instructions (Signed)
Limit your sodium (Salt) intake  Return in 3 months for follow-up   

## 2017-02-06 NOTE — Progress Notes (Signed)
Subjective:    Patient ID: Sheryl [name], female    DOB: [birth year], 71 y.o.   MRN: [id number]  HPI  71 year old patient who is hospitalized approximately 2 months ago when she presented with encephalopathy. She was hospitalized for one week.  Evaluation included brain MRI and neurology evaluation.  She was transferred to collapse nursing facility for further rehabilitation. Evaluation also revealed some mild dehydration .  B12 deficiency.  She does have a history of neurosarcoidosis which was not felt to be a factor with her mental status changes. Laboratory studies were obtained from collapse nursing facility.  2 weeks ago and unremarkable except for mild anemia.  Medical regimen reviewed and does include Vesicare, and oxybutynin. The patient is accompanied by 2 caregivers.  Patient has also been placed on Zyprexa. There have been concerns about patient's mental competence and a psychiatry evaluation requested.  MMSE 27/30.   Patient able to recall 0 of 3 items  Past Medical History:  Diagnosis Date  . Allergy   . Cataract   . 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 DEGENERATIVE JOINT DISEASE 12/14/2006  . EXOGENOUS OBESITY 12/14/2006  . FIBROMYALGIA 12/14/2006  . Gallstones   . Hearing loss of left ear   . HYPERLIPIDEMIA 12/14/2006  . HYPERTENSION 12/14/2006  . IBS (irritable bowel syndrome)   . Neurosarcoidosis   . Sinusitis nasal 06/24/2016  . Sleep apnea    no CPAP  . Vision loss of right eye      Social History   Social History  . Marital status: Married    Spouse name: N/A  . Number of children: 0  . Years of education: hs   Occupational History  . disabled   . clerical    Social History Main Topics  . Smoking status: Never Smoker  . Smokeless tobacco: Never Used  . Alcohol use No     Comment: One drink per year  . Drug use: No  . Sexual activity: Not on file   Other Topics Concern  . Not on file   Social History Narrative   Married no children 2 caffeinated beverages a  day    Past Surgical History:  Procedure Laterality Date  . BRAIN BIOPSY     [facility]  . CERVICAL FUSION    . CHOLECYSTECTOMY    . COLONOSCOPY    . LUMBAR FUSION  07/25/2012  . TONSILLECTOMY      Family History  Problem Relation Age of Onset  . Diabetes Sister   . Hypertension Mother   . Emphysema Father   . Colon cancer Neg Hx   . Esophageal cancer Neg Hx   . Rectal cancer Neg Hx   . Stomach cancer Neg Hx     Allergies  Allergen Reactions  . Adhesive [Tape] Rash    Redness from tape.   . Latex Anaphylaxis, Rash and Other (See Comments)    Added based on information entered during case entry, please review and add reactions, type, and severity as needed. Patient cannot confirm this allergy.  . Cyclophosphamide Other (See Comments)    Caused diverticulitis.  She would not eat or drink for days, while taking it.  . Sulfa Antibiotics     PER MAR    Current Outpatient Prescriptions on File Prior to Visit  Medication Sig Dispense Refill  . acetaminophen (TYLENOL) 325 MG tablet Take 650 mg by mouth every 6 (six) hours as needed.    Marland Kitchen aspirin 81 MG tablet Take 81 mg by mouth  daily.      . atorvastatin (LIPITOR) 40 MG tablet Take 1 tablet (40 mg total) by mouth daily. 90 tablet 3  . brimonidine (ALPHAGAN) 0.15 % ophthalmic solution Place 1 drop into the left eye 3 (three) times daily.     . Cholecalciferol (VITAMIN D3) 2000 UNITS TABS Take 1 tablet by mouth daily.    Marland Kitchen docusate sodium (COLACE) 100 MG capsule Take 100 mg by mouth 2 (two) times daily.    Marland Kitchen FLUoxetine (PROZAC) 20 MG capsule Take 20 mg by mouth daily.    . fluticasone (FLONASE) 50 MCG/ACT nasal spray Place 1 spray into both nostrils daily as needed for allergies.     Marland Kitchen OLANZapine (ZYPREXA) 2.5 MG tablet Take 2.5 mg by mouth at bedtime.    . pantoprazole (PROTONIX) 40 MG tablet Take 40 mg by mouth daily.    . polyethylene glycol (MIRALAX / GLYCOLAX) packet Take 17 g by mouth daily.     No current  facility-administered medications on file prior to visit.     BP 132/80 (BP Location: Left Arm, Patient Position: Sitting, Cuff Size: Normal)   Pulse 80   Temp 98.1 F (36.7 C) (Oral)   Ht 5' (1.524 m)   Wt 156 lb (70.8 kg)   SpO2 98%   BMI 30.47 kg/m     Review of Systems  Constitutional: Negative.   HENT: Negative for congestion, dental problem, hearing loss, rhinorrhea, sinus pressure, sore throat and tinnitus.   Eyes: Negative for pain, discharge and visual disturbance.  Respiratory: Negative for cough and shortness of breath.   Cardiovascular: Negative for chest pain, palpitations and leg swelling.  Gastrointestinal: Negative for abdominal distention, abdominal pain, blood in stool, constipation, diarrhea, nausea and vomiting.  Genitourinary: Negative for difficulty urinating, dysuria, flank pain, frequency, hematuria, pelvic pain, urgency, vaginal bleeding, vaginal discharge and vaginal pain.  Musculoskeletal: Positive for gait problem. Negative for arthralgias and joint swelling.  Skin: Negative for rash.  Neurological: Negative for dizziness, syncope, speech difficulty, weakness, numbness and headaches.  Hematological: Negative for adenopathy.  Psychiatric/Behavioral: Positive for behavioral problems and decreased concentration. Negative for agitation and dysphoric mood. The patient is not nervous/anxious.        Objective:   Physical Exam  Constitutional: She is oriented to person, place, and time. She appears well-developed and well-nourished.  HENT:  Head: Normocephalic.  Right Ear: External ear normal.  Left Ear: External ear normal.  Mouth/Throat: Oropharynx is clear and moist.  Eyes: Pupils are equal, round, and reactive to light. Conjunctivae and EOM are normal.  Blind right eye  Neck: Normal range of motion. Neck supple. No thyromegaly present.  Cardiovascular: Normal rate, regular rhythm, normal heart sounds and intact distal pulses.   Pulmonary/Chest:  Effort normal and breath sounds normal.  Abdominal: Soft. Bowel sounds are normal. She exhibits no mass. There is no tenderness.  Musculoskeletal: Normal range of motion.  Lymphadenopathy:    She has no cervical adenopathy.  Neurological: She is alert and oriented to person, place, and time.  Skin: Skin is warm and dry. No rash noted.  Psychiatric: She has a normal mood and affect. Her behavior is normal.          Assessment & Plan:   Essential hypertension.  Well-controlled Neurosarcoidosis History of subacute encephalopathy.  Will discontinue Vesicare and oxybutynin. MMSE 27/30 .  Will schedule psychiatry evaluation to determine mental competency. Exogenous obesity  CLL History of B12 deficiency.  Continue supplemental B12

## 2017-02-08 DIAGNOSIS — Z4789 Encounter for other orthopedic aftercare: Secondary | ICD-10-CM | POA: Diagnosis not present

## 2017-02-08 DIAGNOSIS — R2689 Other abnormalities of gait and mobility: Secondary | ICD-10-CM | POA: Diagnosis not present

## 2017-02-08 DIAGNOSIS — M4325 Fusion of spine, thoracolumbar region: Secondary | ICD-10-CM | POA: Diagnosis not present

## 2017-02-08 DIAGNOSIS — R41841 Cognitive communication deficit: Secondary | ICD-10-CM | POA: Diagnosis not present

## 2017-02-08 DIAGNOSIS — I10 Essential (primary) hypertension: Secondary | ICD-10-CM | POA: Diagnosis not present

## 2017-02-08 DIAGNOSIS — Z981 Arthrodesis status: Secondary | ICD-10-CM | POA: Diagnosis not present

## 2017-02-08 DIAGNOSIS — M545 Low back pain: Secondary | ICD-10-CM | POA: Diagnosis not present

## 2017-02-08 DIAGNOSIS — H9192 Unspecified hearing loss, left ear: Secondary | ICD-10-CM | POA: Diagnosis not present

## 2017-02-09 DIAGNOSIS — R41841 Cognitive communication deficit: Secondary | ICD-10-CM | POA: Diagnosis not present

## 2017-02-09 DIAGNOSIS — Z981 Arthrodesis status: Secondary | ICD-10-CM | POA: Diagnosis not present

## 2017-02-09 DIAGNOSIS — R2689 Other abnormalities of gait and mobility: Secondary | ICD-10-CM | POA: Diagnosis not present

## 2017-02-09 DIAGNOSIS — H9192 Unspecified hearing loss, left ear: Secondary | ICD-10-CM | POA: Diagnosis not present

## 2017-02-09 DIAGNOSIS — Z4789 Encounter for other orthopedic aftercare: Secondary | ICD-10-CM | POA: Diagnosis not present

## 2017-02-09 DIAGNOSIS — I10 Essential (primary) hypertension: Secondary | ICD-10-CM | POA: Diagnosis not present

## 2017-02-10 DIAGNOSIS — R2689 Other abnormalities of gait and mobility: Secondary | ICD-10-CM | POA: Diagnosis not present

## 2017-02-10 DIAGNOSIS — H9192 Unspecified hearing loss, left ear: Secondary | ICD-10-CM | POA: Diagnosis not present

## 2017-02-10 DIAGNOSIS — I10 Essential (primary) hypertension: Secondary | ICD-10-CM | POA: Diagnosis not present

## 2017-02-10 DIAGNOSIS — Z4789 Encounter for other orthopedic aftercare: Secondary | ICD-10-CM | POA: Diagnosis not present

## 2017-02-10 DIAGNOSIS — Z981 Arthrodesis status: Secondary | ICD-10-CM | POA: Diagnosis not present

## 2017-02-10 DIAGNOSIS — R41841 Cognitive communication deficit: Secondary | ICD-10-CM | POA: Diagnosis not present

## 2017-02-13 DIAGNOSIS — M79673 Pain in unspecified foot: Secondary | ICD-10-CM | POA: Diagnosis not present

## 2017-02-13 DIAGNOSIS — L851 Acquired keratosis [keratoderma] palmaris et plantaris: Secondary | ICD-10-CM | POA: Diagnosis not present

## 2017-02-13 DIAGNOSIS — L602 Onychogryphosis: Secondary | ICD-10-CM | POA: Diagnosis not present

## 2017-02-13 DIAGNOSIS — N179 Acute kidney failure, unspecified: Secondary | ICD-10-CM | POA: Diagnosis not present

## 2017-02-13 DIAGNOSIS — R2681 Unsteadiness on feet: Secondary | ICD-10-CM | POA: Diagnosis not present

## 2017-02-14 DIAGNOSIS — I10 Essential (primary) hypertension: Secondary | ICD-10-CM | POA: Diagnosis not present

## 2017-02-14 DIAGNOSIS — Z4789 Encounter for other orthopedic aftercare: Secondary | ICD-10-CM | POA: Diagnosis not present

## 2017-02-14 DIAGNOSIS — R2689 Other abnormalities of gait and mobility: Secondary | ICD-10-CM | POA: Diagnosis not present

## 2017-02-14 DIAGNOSIS — H9192 Unspecified hearing loss, left ear: Secondary | ICD-10-CM | POA: Diagnosis not present

## 2017-02-14 DIAGNOSIS — R41841 Cognitive communication deficit: Secondary | ICD-10-CM | POA: Diagnosis not present

## 2017-02-14 DIAGNOSIS — Z981 Arthrodesis status: Secondary | ICD-10-CM | POA: Diagnosis not present

## 2017-03-15 DIAGNOSIS — F251 Schizoaffective disorder, depressive type: Secondary | ICD-10-CM | POA: Diagnosis not present

## 2017-03-16 ENCOUNTER — Encounter: Payer: Self-pay | Admitting: Internal Medicine

## 2017-03-16 DIAGNOSIS — R41841 Cognitive communication deficit: Secondary | ICD-10-CM | POA: Diagnosis not present

## 2017-03-16 DIAGNOSIS — N179 Acute kidney failure, unspecified: Secondary | ICD-10-CM | POA: Diagnosis not present

## 2017-03-16 DIAGNOSIS — F259 Schizoaffective disorder, unspecified: Secondary | ICD-10-CM | POA: Diagnosis not present

## 2017-03-16 DIAGNOSIS — E870 Hyperosmolality and hypernatremia: Secondary | ICD-10-CM | POA: Diagnosis not present

## 2017-03-16 DIAGNOSIS — D509 Iron deficiency anemia, unspecified: Secondary | ICD-10-CM | POA: Diagnosis not present

## 2017-03-16 DIAGNOSIS — I10 Essential (primary) hypertension: Secondary | ICD-10-CM | POA: Diagnosis not present

## 2017-03-16 DIAGNOSIS — F0390 Unspecified dementia without behavioral disturbance: Secondary | ICD-10-CM | POA: Diagnosis not present

## 2017-03-19 ENCOUNTER — Encounter: Payer: Self-pay | Admitting: Internal Medicine

## 2017-03-20 NOTE — Telephone Encounter (Signed)
Will send to Dr Raliegh Ip as Juluis Rainier to read e-mail

## 2017-03-22 DIAGNOSIS — F0390 Unspecified dementia without behavioral disturbance: Secondary | ICD-10-CM | POA: Diagnosis not present

## 2017-03-22 DIAGNOSIS — D509 Iron deficiency anemia, unspecified: Secondary | ICD-10-CM | POA: Diagnosis not present

## 2017-03-22 DIAGNOSIS — I10 Essential (primary) hypertension: Secondary | ICD-10-CM | POA: Diagnosis not present

## 2017-03-22 DIAGNOSIS — F259 Schizoaffective disorder, unspecified: Secondary | ICD-10-CM | POA: Diagnosis not present

## 2017-03-22 DIAGNOSIS — R41841 Cognitive communication deficit: Secondary | ICD-10-CM | POA: Diagnosis not present

## 2017-03-24 DIAGNOSIS — D509 Iron deficiency anemia, unspecified: Secondary | ICD-10-CM | POA: Diagnosis not present

## 2017-03-24 DIAGNOSIS — R41841 Cognitive communication deficit: Secondary | ICD-10-CM | POA: Diagnosis not present

## 2017-03-24 DIAGNOSIS — F259 Schizoaffective disorder, unspecified: Secondary | ICD-10-CM | POA: Diagnosis not present

## 2017-03-24 DIAGNOSIS — F0390 Unspecified dementia without behavioral disturbance: Secondary | ICD-10-CM | POA: Diagnosis not present

## 2017-03-24 DIAGNOSIS — I10 Essential (primary) hypertension: Secondary | ICD-10-CM | POA: Diagnosis not present

## 2017-03-27 DIAGNOSIS — R531 Weakness: Secondary | ICD-10-CM | POA: Diagnosis not present

## 2017-03-27 DIAGNOSIS — I10 Essential (primary) hypertension: Secondary | ICD-10-CM | POA: Diagnosis not present

## 2017-03-27 DIAGNOSIS — R41841 Cognitive communication deficit: Secondary | ICD-10-CM | POA: Diagnosis not present

## 2017-03-27 DIAGNOSIS — F0390 Unspecified dementia without behavioral disturbance: Secondary | ICD-10-CM | POA: Diagnosis not present

## 2017-03-27 DIAGNOSIS — F259 Schizoaffective disorder, unspecified: Secondary | ICD-10-CM | POA: Diagnosis not present

## 2017-03-27 DIAGNOSIS — D509 Iron deficiency anemia, unspecified: Secondary | ICD-10-CM | POA: Diagnosis not present

## 2017-03-29 DIAGNOSIS — D509 Iron deficiency anemia, unspecified: Secondary | ICD-10-CM | POA: Diagnosis not present

## 2017-03-29 DIAGNOSIS — I10 Essential (primary) hypertension: Secondary | ICD-10-CM | POA: Diagnosis not present

## 2017-03-29 DIAGNOSIS — R41841 Cognitive communication deficit: Secondary | ICD-10-CM | POA: Diagnosis not present

## 2017-03-29 DIAGNOSIS — F259 Schizoaffective disorder, unspecified: Secondary | ICD-10-CM | POA: Diagnosis not present

## 2017-03-29 DIAGNOSIS — F0390 Unspecified dementia without behavioral disturbance: Secondary | ICD-10-CM | POA: Diagnosis not present

## 2017-04-03 DIAGNOSIS — R41841 Cognitive communication deficit: Secondary | ICD-10-CM | POA: Diagnosis not present

## 2017-04-03 DIAGNOSIS — F0390 Unspecified dementia without behavioral disturbance: Secondary | ICD-10-CM | POA: Diagnosis not present

## 2017-04-03 DIAGNOSIS — F259 Schizoaffective disorder, unspecified: Secondary | ICD-10-CM | POA: Diagnosis not present

## 2017-04-03 DIAGNOSIS — I10 Essential (primary) hypertension: Secondary | ICD-10-CM | POA: Diagnosis not present

## 2017-04-03 DIAGNOSIS — D509 Iron deficiency anemia, unspecified: Secondary | ICD-10-CM | POA: Diagnosis not present

## 2017-04-05 DIAGNOSIS — R41841 Cognitive communication deficit: Secondary | ICD-10-CM | POA: Diagnosis not present

## 2017-04-05 DIAGNOSIS — F259 Schizoaffective disorder, unspecified: Secondary | ICD-10-CM | POA: Diagnosis not present

## 2017-04-05 DIAGNOSIS — F0390 Unspecified dementia without behavioral disturbance: Secondary | ICD-10-CM | POA: Diagnosis not present

## 2017-04-05 DIAGNOSIS — D509 Iron deficiency anemia, unspecified: Secondary | ICD-10-CM | POA: Diagnosis not present

## 2017-04-05 DIAGNOSIS — I10 Essential (primary) hypertension: Secondary | ICD-10-CM | POA: Diagnosis not present

## 2017-04-11 DIAGNOSIS — R531 Weakness: Secondary | ICD-10-CM | POA: Diagnosis not present

## 2017-04-15 ENCOUNTER — Emergency Department (HOSPITAL_COMMUNITY): Payer: Medicare Other

## 2017-04-15 ENCOUNTER — Emergency Department (HOSPITAL_COMMUNITY)
Admission: EM | Admit: 2017-04-15 | Discharge: 2017-04-15 | Disposition: A | Discharge status: Home / Self Care | Payer: Medicare Other | Attending: Emergency Medicine | Admitting: Emergency Medicine

## 2017-04-15 DIAGNOSIS — R4182 Altered mental status, unspecified: Secondary | ICD-10-CM | POA: Diagnosis not present

## 2017-04-15 DIAGNOSIS — M545 Low back pain, unspecified: Secondary | ICD-10-CM

## 2017-04-15 DIAGNOSIS — Z79899 Other long term (current) drug therapy: Secondary | ICD-10-CM | POA: Diagnosis not present

## 2017-04-15 DIAGNOSIS — D869 Sarcoidosis, unspecified: Secondary | ICD-10-CM | POA: Diagnosis not present

## 2017-04-15 DIAGNOSIS — Y999 Unspecified external cause status: Secondary | ICD-10-CM | POA: Diagnosis not present

## 2017-04-15 DIAGNOSIS — W010XXA Fall on same level from slipping, tripping and stumbling without subsequent striking against object, initial encounter: Secondary | ICD-10-CM | POA: Diagnosis not present

## 2017-04-15 DIAGNOSIS — R001 Bradycardia, unspecified: Secondary | ICD-10-CM | POA: Insufficient documentation

## 2017-04-15 DIAGNOSIS — Y9389 Activity, other specified: Secondary | ICD-10-CM | POA: Insufficient documentation

## 2017-04-15 DIAGNOSIS — I10 Essential (primary) hypertension: Secondary | ICD-10-CM | POA: Insufficient documentation

## 2017-04-15 DIAGNOSIS — W19XXXA Unspecified fall, initial encounter: Secondary | ICD-10-CM

## 2017-04-15 DIAGNOSIS — M549 Dorsalgia, unspecified: Secondary | ICD-10-CM | POA: Diagnosis not present

## 2017-04-15 DIAGNOSIS — R296 Repeated falls: Secondary | ICD-10-CM | POA: Diagnosis not present

## 2017-04-15 DIAGNOSIS — Y929 Unspecified place or not applicable: Secondary | ICD-10-CM | POA: Diagnosis not present

## 2017-04-15 DIAGNOSIS — R55 Syncope and collapse: Secondary | ICD-10-CM | POA: Diagnosis not present

## 2017-04-15 DIAGNOSIS — R404 Transient alteration of awareness: Secondary | ICD-10-CM | POA: Diagnosis not present

## 2017-04-15 DIAGNOSIS — Z7982 Long term (current) use of aspirin: Secondary | ICD-10-CM | POA: Diagnosis not present

## 2017-04-15 DIAGNOSIS — Z91040 Latex allergy status: Secondary | ICD-10-CM | POA: Insufficient documentation

## 2017-04-15 DIAGNOSIS — S3992XA Unspecified injury of lower back, initial encounter: Secondary | ICD-10-CM | POA: Diagnosis not present

## 2017-04-15 LAB — URINALYSIS, ROUTINE W REFLEX MICROSCOPIC
Bilirubin Urine: NEGATIVE
Glucose, UA: NEGATIVE mg/dL
Hgb urine dipstick: NEGATIVE
Ketones, ur: NEGATIVE mg/dL
Nitrite: NEGATIVE
Protein, ur: NEGATIVE mg/dL
Specific Gravity, Urine: 1.014 (ref 1.005–1.030)
pH: 6 (ref 5.0–8.0)

## 2017-04-15 LAB — CBC
HCT: 41.6 % (ref 36.0–46.0)
Hemoglobin: 13.3 g/dL (ref 12.0–15.0)
MCH: 26.4 pg (ref 26.0–34.0)
MCHC: 32 g/dL (ref 30.0–36.0)
MCV: 82.7 fL (ref 78.0–100.0)
Platelets: 151 10*3/uL (ref 150–400)
RBC: 5.03 MIL/uL (ref 3.87–5.11)
RDW: 16.7 % — ABNORMAL HIGH (ref 11.5–15.5)
WBC: 7.2 10*3/uL (ref 4.0–10.5)

## 2017-04-15 LAB — BASIC METABOLIC PANEL
Anion gap: 10 (ref 5–15)
BUN: 16 mg/dL (ref 6–20)
CO2: 28 mmol/L (ref 22–32)
Calcium: 9.2 mg/dL (ref 8.9–10.3)
Chloride: 103 mmol/L (ref 101–111)
Creatinine, Ser: 1.04 mg/dL — ABNORMAL HIGH (ref 0.44–1.00)
GFR calc Af Amer: >60 mL/min (ref >60)
GFR calc non Af Amer: 54 mL/min — ABNORMAL LOW (ref >60)
Glucose, Bld: 84 mg/dL (ref 65–99)
Potassium: 3.6 mmol/L (ref 3.5–5.1)
Sodium: 141 mmol/L (ref 135–145)

## 2017-04-15 LAB — I-STAT TROPONIN, ED: Troponin i, poc: 0.01 ng/mL (ref 0.00–0.08)

## 2017-04-15 NOTE — ED Notes (Signed)
Ambulated Pt in hall, Pt had steady gait and was able to walk with assistance. Pt stated that she uses a walker at home.

## 2017-04-15 NOTE — ED Triage Notes (Addendum)
Patient from [place] with [facility] with complaint of fall and possible syncope.  Patient states the last thing she remembers is getting ready to stand to walk and the next thing she remembers is lying on the floor with staffing standing around her. Patient complains of lower back pain and pain at shoulders. Alert and oriented at this time and in no apparent distress time.  Family at bedside states today is the third call in recent time they have received from [facility] about the patient trying to walk without her walker.

## 2017-04-15 NOTE — Discharge Instructions (Signed)
Take tylenol as needed for pain.  Use ice packs on your back if it is hurting.  It is very important that you follow up with your primary care doctor for evaluation if your heart rate.  Return to the ER if you develop numbness, dizziness, chest pain, or any new or worsening symptoms.

## 2017-04-15 NOTE — ED Notes (Signed)
Pt stable, ambulatory, states understanding of discharge instructions 

## 2017-04-15 NOTE — ED Provider Notes (Signed)
[facility] EMERGENCY DEPARTMENT Provider Note   CSN: [id number] Arrival date & time: 04/15/17  1136     History   Chief Complaint Chief Complaint  Patient presents with  . Fall    HPI Sheryl [name] is a 71 y.o. female presenting for evaluation after a fall.  Patient is a resident of [facility].  She states she is not quite sure what happened, but she was talking to her friend, went to take a step, and then fell backwards.  She denies loss of consciousness after the fall.  She states she had the back of her head on the ground.  She did not feel dizzy or have any chest pain prior to the fall.  She is not on blood thinners.  She currently reports bilateral shoulder aching and low back pain.  She denies head pain or neck pain.  She has not taken anything for pain.  She denies vision changes, slurred speech, decreased concentration, nausea, vomiting.  She denies chest pain, shortness of breath, abdominal pain, or loss of bowel or bladder control.  She denies numbness or tingling.  She has not tried to ambulate since the fall.  She states that she is not hurting very much, and does not need anything for pain at this time.  She has never been told that she has a low heart rate.  She has not had any change in medicines recently.  [name], the niece, who is power of attorney also present during history.  [name] states that she has had multiple calls from the facility stating that the patient is trying to walk without her walker, and subsequently falling.  Per chart review, patient is being evaluated for competency.   HPI  Past Medical History:  Diagnosis Date  . Allergy   . Cataract   . 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 DEGENERATIVE JOINT DISEASE 12/14/2006  . EXOGENOUS OBESITY 12/14/2006  . FIBROMYALGIA 12/14/2006  . Gallstones   . Hearing loss of left ear   . HYPERLIPIDEMIA 12/14/2006  . HYPERTENSION 12/14/2006  . IBS (irritable bowel syndrome)   . Neurosarcoidosis   .  Sinusitis nasal 06/24/2016  . Sleep apnea    no CPAP  . Vision loss of right eye     Patient Active Problem List   Diagnosis Date Noted  . Sinusitis nasal 06/24/2016  . History of IBS 10/31/2014  . 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 Back pain 03/26/2014  . Preventive measure 03/26/2014  . Spinal stenosis 04/17/2012  . 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 Chronic lymphocytic leukemia ([place]) 12/14/2006  . Dyslipidemia 12/14/2006  . EXOGENOUS OBESITY 12/14/2006  . Essential hypertension 12/14/2006  . Osteoarthritis 12/14/2006  . FIBROMYALGIA 12/14/2006    Past Surgical History:  Procedure Laterality Date  . BRAIN BIOPSY     [facility]  . CERVICAL FUSION    . CHOLECYSTECTOMY    . COLONOSCOPY    . LUMBAR FUSION  07/25/2012  . TONSILLECTOMY      OB History    No data available       Home Medications    Prior to Admission medications   Medication Sig Start Date End Date Taking? Authorizing Provider  acetaminophen (TYLENOL) 325 MG tablet Take 650 mg by mouth every 6 (six) hours as needed.   Yes [provider]  aspirin 81 MG tablet Take 81 mg by mouth daily.     Yes [provider]  atorvastatin (LIPITOR) 40 MG tablet Take 1 tablet (40 mg total) by mouth daily. Patient taking differently: Take 40 mg by mouth at bedtime.  08/10/16  Yes Marletta Lor, MD  brimonidine (ALPHAGAN) 0.15 % ophthalmic solution Place 1 drop into the left eye 3 (three) times daily.  09/03/14  Yes [provider]  Cholecalciferol (VITAMIN D3) 2000 UNITS TABS Take 1 tablet by mouth daily.   Yes [provider]  ferrous sulfate 325 (65 FE) MG tablet Take 325 mg by mouth daily with breakfast.   Yes [provider]  FLUoxetine (PROZAC) 20 MG capsule Take 20 mg by mouth daily.   Yes [provider]  fluticasone (FLONASE) 50 MCG/ACT nasal spray Place 1 spray into both nostrils  daily as needed for allergies.  10/16/12  Yes [provider]  LORazepam (ATIVAN) 0.5 MG tablet Take 0.25 mg by mouth every 12 (twelve) hours as needed. 02/23/17  Yes [provider]  magnesium oxide (MAG-OX) 400 MG tablet Take 400 mg by mouth daily.   Yes [provider]  Multiple Vitamin (MULTI-VITAMINS) TABS Take 1 tablet by mouth daily.   Yes [provider]  OLANZapine (ZYPREXA) 7.5 MG tablet Take 7.5 mg by mouth at bedtime.    Yes [provider]  pantoprazole (PROTONIX) 40 MG tablet Take 40 mg by mouth daily.   Yes [provider]  polyethylene glycol (MIRALAX / GLYCOLAX) packet Take 17 g by mouth daily as needed.    Yes [provider]  potassium chloride (K-DUR,KLOR-CON) 10 MEQ tablet Take 10 mEq by mouth daily. 04/07/17  Yes [provider]  traZODone (DESYREL) 50 MG tablet Take 25 mg by mouth as needed.  02/23/17  Yes [provider]  vitamin B-12 (CYANOCOBALAMIN) 1000 MCG tablet Take 1 tablet (1,000 mcg total) by mouth daily. Patient not taking: Reported on 04/15/2017 02/06/17   Marletta Lor, MD    Family History Family History  Problem Relation Age of Onset  . Diabetes Sister   . Hypertension Mother   . Emphysema Father   . Colon cancer Neg Hx   . Esophageal cancer Neg Hx   . Rectal cancer Neg Hx   . Stomach cancer Neg Hx     Social History Social History  Substance Use Topics  . Smoking status: Never Smoker  . Smokeless tobacco: Never Used  . Alcohol use No     Comment: One drink per year     Allergies   Adhesive [tape]; Latex; Cyclophosphamide; and Sulfa antibiotics   Review of Systems Review of Systems  Musculoskeletal: Positive for arthralgias and back pain.  Neurological: Negative for headaches.  Hematological: Does not bruise/bleed easily.     Physical Exam Updated Vital Signs BP 139/68   Pulse 62   Temp (!) 97.3 F (36.3 C) (Oral)   Resp 16   SpO2 99%    Physical Exam  Constitutional: She is oriented to person, place, and time. She appears well-developed and well-nourished. No distress.  HENT:  Head: Normocephalic and atraumatic.  No TTP of scalp. No obvious lac or hematoma  Eyes: Pupils are equal, round, and reactive to light. Conjunctivae and EOM are normal.  Neck: Normal range of motion.  No TTP of midline c-spine.   Cardiovascular: Regular rhythm and intact distal pulses.   Bradycardic 55-60  Pulmonary/Chest: Effort normal and breath sounds normal. No respiratory distress. She has no decreased breath sounds. She has no wheezes. She has no  rhonchi. She has no rales.  No TTP of chest wall.   Abdominal: Soft. Bowel sounds are normal. She exhibits no distension and no mass. There is no tenderness. There is no guarding.  Musculoskeletal:  TTP of low back, R >L. No pain of midline spine.  Mild TTP of bilateral scapula. Full active ROM of upper ext bilaterally without pain. Radial pulses intact bilaterally. Sensation intact bilaterally. Strength equal bilaterally. No TTP of pelvis, and no pelvic instability. No TTP of lower ext, pedal pulses intact bilaterally and sensation intact bilaterally.  No obvious deformities, laceration or hematomas noted.   Neurological: She is alert and oriented to person, place, and time. She has normal strength. No cranial nerve deficit or sensory deficit. GCS eye subscore is 4. GCS verbal subscore is 5. GCS motor subscore is 6.  Skin: Skin is warm and dry.  Psychiatric: She has a normal mood and affect.  Nursing note and vitals reviewed.    ED Treatments / Results  Labs (all labs ordered are listed, but only abnormal results are displayed) Labs Reviewed  URINALYSIS, ROUTINE W REFLEX MICROSCOPIC - Abnormal; Notable for the following:       Result Value   APPearance HAZY (*)    Leukocytes, UA LARGE (*)    Bacteria, UA FEW (*)    Squamous Epithelial / LPF 0-5 (*)    Non Squamous Epithelial 0-5 (*)     All other components within normal limits  BASIC METABOLIC PANEL - Abnormal; Notable for the following:    Creatinine, Ser 1.04 (*)    GFR calc non Af Amer 54 (*)    All other components within normal limits  CBC - Abnormal; Notable for the following:    RDW 16.7 (*)    All other components within normal limits  I-STAT TROPONIN, ED    EKG  EKG Interpretation  Date/Time:  Saturday April 15 2017 11:46:46 EDT Ventricular Rate:  55 PR Interval:    QRS Duration: 83 QT Interval:  505 QTC Calculation: 484 R Axis:   37 Text Interpretation:  Sinus rhythm Anterior infarct, old since last tracing no significant change Confirmed by Malvin Johns [phone]) on 04/15/2017 1:36:26 PM       Radiology Dg Lumbar Spine Complete  Result Date: 04/15/2017 CLINICAL DATA:  71 year old female with lumbar spine pain after falling at home. EXAM: LUMBAR SPINE - COMPLETE 4+ VIEW COMPARISON:  Prior CT scan of the lumbar spine 11/29/2016 FINDINGS: No evidence of acute fracture. The vertebral body heights are maintained. Surgical changes of prior extensive thoracolumbar posterior interbody fusion extending from T10 through L5 with bilateral pedicle screws and rods as well as interbody grafts at L3-L4 and L4-L5. No evidence of hardware complication. Atherosclerotic calcifications are visualized in the abdominal aorta. Unremarkable bowel gas pattern. The visualized pelvis is intact. IMPRESSION: 1. Surgical changes of prior extensive thoraco lumbar posterior interbody fusion extending from T10 through L5 without evidence of hardware complication or acute fracture. 2.  Aortic Atherosclerosis (ICD10-170.0) Electronically Signed   By: Jacqulynn Cadet M.D.   On: 04/15/2017 14:53   Ct Head Wo Contrast  Result Date: 04/15/2017 CLINICAL DATA:  71 year old female with multiple recent falls. Initial encounter. EXAM: CT HEAD WITHOUT CONTRAST CT CERVICAL SPINE WITHOUT CONTRAST TECHNIQUE: Multidetector CT imaging of the head  and cervical spine was performed following the standard protocol without intravenous contrast. Multiplanar CT image reconstructions of the cervical spine were also generated. COMPARISON:  11/28/2016 and prior exams. FINDINGS: CT HEAD  FINDINGS Brain: No evidence of acute infarction, hemorrhage, hydrocephalus, extra-axial collection or mass lesion/mass effect. Atrophy and mild chronic small-vessel white matter ischemic changes again noted. Vascular: Mild atherosclerotic calcifications noted. Skull: Normal. Negative for fracture or focal lesion. Sinuses/Orbits: No acute finding. Other: None. CT CERVICAL SPINE FINDINGS Alignment: Normal. Skull base and vertebrae: No acute fracture. No primary bone lesion or focal pathologic process. Soft tissues and spinal canal: No prevertebral fluid or swelling. No visible canal hematoma. Disc levels: Anterior/interbody fusion from C3-C7 noted. No complicating features identified. Mild multilevel facet arthropathy identified, right greater than left. Upper chest: No acute abnormality Other: None IMPRESSION: 1. No evidence of acute intracranial abnormality cerebral atrophy and mild chronic small-vessel white matter ischemic changes 2. No static evidence of acute injury to the cervical spine. Anterior fusion changes from C3-C7. Electronically Signed   By: Margarette Canada M.D.   On: 04/15/2017 15:33   Ct Cervical Spine Wo Contrast  Result Date: 04/15/2017 CLINICAL DATA:  71 year old female with multiple recent falls. Initial encounter. EXAM: CT HEAD WITHOUT CONTRAST CT CERVICAL SPINE WITHOUT CONTRAST TECHNIQUE: Multidetector CT imaging of the head and cervical spine was performed following the standard protocol without intravenous contrast. Multiplanar CT image reconstructions of the cervical spine were also generated. COMPARISON:  11/28/2016 and prior exams. FINDINGS: CT HEAD FINDINGS Brain: No evidence of acute infarction, hemorrhage, hydrocephalus, extra-axial collection or mass  lesion/mass effect. Atrophy and mild chronic small-vessel white matter ischemic changes again noted. Vascular: Mild atherosclerotic calcifications noted. Skull: Normal. Negative for fracture or focal lesion. Sinuses/Orbits: No acute finding. Other: None. CT CERVICAL SPINE FINDINGS Alignment: Normal. Skull base and vertebrae: No acute fracture. No primary bone lesion or focal pathologic process. Soft tissues and spinal canal: No prevertebral fluid or swelling. No visible canal hematoma. Disc levels: Anterior/interbody fusion from C3-C7 noted. No complicating features identified. Mild multilevel facet arthropathy identified, right greater than left. Upper chest: No acute abnormality Other: None IMPRESSION: 1. No evidence of acute intracranial abnormality cerebral atrophy and mild chronic small-vessel white matter ischemic changes 2. No static evidence of acute injury to the cervical spine. Anterior fusion changes from C3-C7. Electronically Signed   By: Margarette Canada M.D.   On: 04/15/2017 15:33    Procedures Procedures (including critical care time)  Medications Ordered in ED Medications - No data to display   Initial Impression / Assessment and Plan / ED Course  I have reviewed the triage vital signs and the nursing notes.  Pertinent labs & imaging results that were available during my care of the patient were reviewed by me and considered in my medical decision making (see chart for details).     Pt presenting for evaluation after a fall. Reporting bilateral shoulder pain and low back pain. Physical exam without obvious deformities. Neurologically intact. Pt's HR slightly brady, btwn 55 and 60. She is not on beta blockers or other medications that cause bradycardia. She is asymptomatic. Will obtain EKG and troponin for further evaluation. Will obtain basic labs and UA to ensure no medical cause for fall. CT head and neck, and xray lumbar back for evaluation.   UA negative for infection. troponin  negative. BMP and cbc reassuring. EKG without abnormality. Imaging without acute abnormality. Pt ambulated in hallway without difficulty. Case discussed with attending, and Dr. Tamera Punt evaluated the pt. Pt to f/u with PCP regarding bradycardia. I do not believe there was an emergent medical cause for the fall. At this time, pt appears safe for discharge. Return  precautions given. Pt states she understands and agrees to plan.     Final Clinical Impressions(s) / ED Diagnoses   Final diagnoses:  Fall, initial encounter  Acute right-sided low back pain without sciatica  Bradycardia    New Prescriptions Discharge Medication List as of 04/15/2017  4:44 PM       Franchot Heidelberg, PA-C 04/15/17 2148    Malvin Johns, MD 04/16/17 [phone]

## 2017-04-15 NOTE — ED Notes (Signed)
Pt. Was picked up by EMS to be brought back to her residence at 19:45. Pt. Was moved OTF at 19:46

## 2017-04-15 NOTE — ED Notes (Signed)
Pt calling out to see how much longer [facility] will be until she can go home.  Explained to pt we are unable to give time frame.Marland KitchenMarland Kitchen

## 2017-04-17 DIAGNOSIS — M79673 Pain in unspecified foot: Secondary | ICD-10-CM | POA: Diagnosis not present

## 2017-04-17 DIAGNOSIS — M6281 Muscle weakness (generalized): Secondary | ICD-10-CM | POA: Diagnosis not present

## 2017-04-17 DIAGNOSIS — R269 Unspecified abnormalities of gait and mobility: Secondary | ICD-10-CM | POA: Diagnosis not present

## 2017-04-17 DIAGNOSIS — L603 Nail dystrophy: Secondary | ICD-10-CM | POA: Diagnosis not present

## 2017-05-10 DIAGNOSIS — R531 Weakness: Secondary | ICD-10-CM | POA: Diagnosis not present

## 2017-05-22 DIAGNOSIS — M4325 Fusion of spine, thoracolumbar region: Secondary | ICD-10-CM | POA: Diagnosis not present

## 2017-05-22 DIAGNOSIS — M545 Low back pain: Secondary | ICD-10-CM | POA: Diagnosis not present

## 2017-06-21 ENCOUNTER — Encounter: Payer: Self-pay | Admitting: Internal Medicine

## 2017-06-26 ENCOUNTER — Other Ambulatory Visit: Payer: Self-pay | Admitting: Internal Medicine

## 2017-06-28 DIAGNOSIS — R531 Weakness: Secondary | ICD-10-CM | POA: Diagnosis not present

## 2017-07-03 DIAGNOSIS — R2689 Other abnormalities of gait and mobility: Secondary | ICD-10-CM | POA: Diagnosis not present

## 2017-07-03 DIAGNOSIS — M6281 Muscle weakness (generalized): Secondary | ICD-10-CM | POA: Diagnosis not present

## 2017-07-03 DIAGNOSIS — M79673 Pain in unspecified foot: Secondary | ICD-10-CM | POA: Diagnosis not present

## 2017-07-03 DIAGNOSIS — L605 Yellow nail syndrome: Secondary | ICD-10-CM | POA: Diagnosis not present

## 2017-07-05 DIAGNOSIS — F251 Schizoaffective disorder, depressive type: Secondary | ICD-10-CM | POA: Diagnosis not present

## 2017-07-06 DIAGNOSIS — I10 Essential (primary) hypertension: Secondary | ICD-10-CM | POA: Diagnosis not present

## 2017-07-06 DIAGNOSIS — E119 Type 2 diabetes mellitus without complications: Secondary | ICD-10-CM | POA: Diagnosis not present

## 2017-07-06 DIAGNOSIS — Z79899 Other long term (current) drug therapy: Secondary | ICD-10-CM | POA: Diagnosis not present

## 2017-07-06 DIAGNOSIS — E785 Hyperlipidemia, unspecified: Secondary | ICD-10-CM | POA: Diagnosis not present

## 2017-07-06 DIAGNOSIS — D649 Anemia, unspecified: Secondary | ICD-10-CM | POA: Diagnosis not present

## 2017-07-10 ENCOUNTER — Encounter: Payer: Self-pay | Admitting: Internal Medicine

## 2017-07-18 DIAGNOSIS — R531 Weakness: Secondary | ICD-10-CM | POA: Diagnosis not present

## 2017-08-09 DIAGNOSIS — R531 Weakness: Secondary | ICD-10-CM | POA: Diagnosis not present

## 2017-08-16 DIAGNOSIS — F419 Anxiety disorder, unspecified: Secondary | ICD-10-CM | POA: Diagnosis not present

## 2017-08-16 DIAGNOSIS — G4700 Insomnia, unspecified: Secondary | ICD-10-CM | POA: Diagnosis not present

## 2017-08-16 DIAGNOSIS — F251 Schizoaffective disorder, depressive type: Secondary | ICD-10-CM | POA: Diagnosis not present

## 2017-09-04 DIAGNOSIS — M79673 Pain in unspecified foot: Secondary | ICD-10-CM | POA: Diagnosis not present

## 2017-09-04 DIAGNOSIS — L603 Nail dystrophy: Secondary | ICD-10-CM | POA: Diagnosis not present

## 2017-09-04 DIAGNOSIS — R2681 Unsteadiness on feet: Secondary | ICD-10-CM | POA: Diagnosis not present

## 2017-09-04 DIAGNOSIS — M6281 Muscle weakness (generalized): Secondary | ICD-10-CM | POA: Diagnosis not present

## 2017-09-06 DIAGNOSIS — F419 Anxiety disorder, unspecified: Secondary | ICD-10-CM | POA: Diagnosis not present

## 2017-09-06 DIAGNOSIS — R630 Anorexia: Secondary | ICD-10-CM | POA: Diagnosis not present

## 2017-09-06 DIAGNOSIS — F251 Schizoaffective disorder, depressive type: Secondary | ICD-10-CM | POA: Diagnosis not present

## 2017-10-04 DIAGNOSIS — F419 Anxiety disorder, unspecified: Secondary | ICD-10-CM | POA: Diagnosis not present

## 2017-10-04 DIAGNOSIS — F251 Schizoaffective disorder, depressive type: Secondary | ICD-10-CM | POA: Diagnosis not present

## 2017-10-04 DIAGNOSIS — R630 Anorexia: Secondary | ICD-10-CM | POA: Diagnosis not present

## 2017-10-14 DIAGNOSIS — M6281 Muscle weakness (generalized): Secondary | ICD-10-CM | POA: Diagnosis not present

## 2017-10-14 DIAGNOSIS — F0390 Unspecified dementia without behavioral disturbance: Secondary | ICD-10-CM | POA: Diagnosis not present

## 2017-10-14 DIAGNOSIS — R41841 Cognitive communication deficit: Secondary | ICD-10-CM | POA: Diagnosis not present

## 2017-10-14 DIAGNOSIS — R2689 Other abnormalities of gait and mobility: Secondary | ICD-10-CM | POA: Diagnosis not present

## 2017-10-14 DIAGNOSIS — I10 Essential (primary) hypertension: Secondary | ICD-10-CM | POA: Diagnosis not present

## 2017-10-14 DIAGNOSIS — R4702 Dysphasia: Secondary | ICD-10-CM | POA: Diagnosis not present

## 2017-10-16 DIAGNOSIS — F0390 Unspecified dementia without behavioral disturbance: Secondary | ICD-10-CM | POA: Diagnosis not present

## 2017-10-16 DIAGNOSIS — R4702 Dysphasia: Secondary | ICD-10-CM | POA: Diagnosis not present

## 2017-10-16 DIAGNOSIS — M6281 Muscle weakness (generalized): Secondary | ICD-10-CM | POA: Diagnosis not present

## 2017-10-16 DIAGNOSIS — I10 Essential (primary) hypertension: Secondary | ICD-10-CM | POA: Diagnosis not present

## 2017-10-16 DIAGNOSIS — R41841 Cognitive communication deficit: Secondary | ICD-10-CM | POA: Diagnosis not present

## 2017-10-16 DIAGNOSIS — R2689 Other abnormalities of gait and mobility: Secondary | ICD-10-CM | POA: Diagnosis not present

## 2017-10-18 DIAGNOSIS — R2689 Other abnormalities of gait and mobility: Secondary | ICD-10-CM | POA: Diagnosis not present

## 2017-10-18 DIAGNOSIS — F0390 Unspecified dementia without behavioral disturbance: Secondary | ICD-10-CM | POA: Diagnosis not present

## 2017-10-18 DIAGNOSIS — M6281 Muscle weakness (generalized): Secondary | ICD-10-CM | POA: Diagnosis not present

## 2017-10-18 DIAGNOSIS — I10 Essential (primary) hypertension: Secondary | ICD-10-CM | POA: Diagnosis not present

## 2017-10-18 DIAGNOSIS — R4702 Dysphasia: Secondary | ICD-10-CM | POA: Diagnosis not present

## 2017-10-18 DIAGNOSIS — R41841 Cognitive communication deficit: Secondary | ICD-10-CM | POA: Diagnosis not present

## 2017-10-19 DIAGNOSIS — R41841 Cognitive communication deficit: Secondary | ICD-10-CM | POA: Diagnosis not present

## 2017-10-19 DIAGNOSIS — R2689 Other abnormalities of gait and mobility: Secondary | ICD-10-CM | POA: Diagnosis not present

## 2017-10-19 DIAGNOSIS — I10 Essential (primary) hypertension: Secondary | ICD-10-CM | POA: Diagnosis not present

## 2017-10-19 DIAGNOSIS — M6281 Muscle weakness (generalized): Secondary | ICD-10-CM | POA: Diagnosis not present

## 2017-10-19 DIAGNOSIS — R4702 Dysphasia: Secondary | ICD-10-CM | POA: Diagnosis not present

## 2017-10-19 DIAGNOSIS — F0390 Unspecified dementia without behavioral disturbance: Secondary | ICD-10-CM | POA: Diagnosis not present

## 2017-10-23 DIAGNOSIS — I10 Essential (primary) hypertension: Secondary | ICD-10-CM | POA: Diagnosis not present

## 2017-10-23 DIAGNOSIS — R41841 Cognitive communication deficit: Secondary | ICD-10-CM | POA: Diagnosis not present

## 2017-10-23 DIAGNOSIS — M6281 Muscle weakness (generalized): Secondary | ICD-10-CM | POA: Diagnosis not present

## 2017-10-23 DIAGNOSIS — F0390 Unspecified dementia without behavioral disturbance: Secondary | ICD-10-CM | POA: Diagnosis not present

## 2017-10-23 DIAGNOSIS — R2689 Other abnormalities of gait and mobility: Secondary | ICD-10-CM | POA: Diagnosis not present

## 2017-10-23 DIAGNOSIS — R4702 Dysphasia: Secondary | ICD-10-CM | POA: Diagnosis not present

## 2017-10-24 DIAGNOSIS — R2689 Other abnormalities of gait and mobility: Secondary | ICD-10-CM | POA: Diagnosis not present

## 2017-10-24 DIAGNOSIS — I10 Essential (primary) hypertension: Secondary | ICD-10-CM | POA: Diagnosis not present

## 2017-10-24 DIAGNOSIS — R4702 Dysphasia: Secondary | ICD-10-CM | POA: Diagnosis not present

## 2017-10-24 DIAGNOSIS — R41841 Cognitive communication deficit: Secondary | ICD-10-CM | POA: Diagnosis not present

## 2017-10-24 DIAGNOSIS — F0390 Unspecified dementia without behavioral disturbance: Secondary | ICD-10-CM | POA: Diagnosis not present

## 2017-10-24 DIAGNOSIS — M6281 Muscle weakness (generalized): Secondary | ICD-10-CM | POA: Diagnosis not present

## 2017-10-25 DIAGNOSIS — R41841 Cognitive communication deficit: Secondary | ICD-10-CM | POA: Diagnosis not present

## 2017-10-25 DIAGNOSIS — M6281 Muscle weakness (generalized): Secondary | ICD-10-CM | POA: Diagnosis not present

## 2017-10-25 DIAGNOSIS — R4702 Dysphasia: Secondary | ICD-10-CM | POA: Diagnosis not present

## 2017-10-25 DIAGNOSIS — F0390 Unspecified dementia without behavioral disturbance: Secondary | ICD-10-CM | POA: Diagnosis not present

## 2017-10-25 DIAGNOSIS — R2689 Other abnormalities of gait and mobility: Secondary | ICD-10-CM | POA: Diagnosis not present

## 2017-10-25 DIAGNOSIS — I10 Essential (primary) hypertension: Secondary | ICD-10-CM | POA: Diagnosis not present

## 2017-10-26 DIAGNOSIS — I10 Essential (primary) hypertension: Secondary | ICD-10-CM | POA: Diagnosis not present

## 2017-10-26 DIAGNOSIS — M6281 Muscle weakness (generalized): Secondary | ICD-10-CM | POA: Diagnosis not present

## 2017-10-26 DIAGNOSIS — R4702 Dysphasia: Secondary | ICD-10-CM | POA: Diagnosis not present

## 2017-10-26 DIAGNOSIS — R41841 Cognitive communication deficit: Secondary | ICD-10-CM | POA: Diagnosis not present

## 2017-10-26 DIAGNOSIS — F0390 Unspecified dementia without behavioral disturbance: Secondary | ICD-10-CM | POA: Diagnosis not present

## 2017-10-26 DIAGNOSIS — R2689 Other abnormalities of gait and mobility: Secondary | ICD-10-CM | POA: Diagnosis not present

## 2017-10-28 ENCOUNTER — Emergency Department (HOSPITAL_COMMUNITY): Payer: Medicare Other

## 2017-10-28 ENCOUNTER — Emergency Department (HOSPITAL_COMMUNITY)
Admission: EM | Admit: 2017-10-28 | Discharge: 2017-10-28 | Disposition: A | Discharge status: Home / Self Care | Payer: Medicare Other | Attending: Emergency Medicine | Admitting: Emergency Medicine

## 2017-10-28 ENCOUNTER — Encounter (HOSPITAL_COMMUNITY): Payer: Self-pay | Admitting: Pharmacy Technician

## 2017-10-28 DIAGNOSIS — S0990XA Unspecified injury of head, initial encounter: Secondary | ICD-10-CM | POA: Diagnosis not present

## 2017-10-28 DIAGNOSIS — S0190XA Unspecified open wound of unspecified part of head, initial encounter: Secondary | ICD-10-CM | POA: Diagnosis not present

## 2017-10-28 DIAGNOSIS — Z79899 Other long term (current) drug therapy: Secondary | ICD-10-CM | POA: Diagnosis not present

## 2017-10-28 DIAGNOSIS — Y9301 Activity, walking, marching and hiking: Secondary | ICD-10-CM | POA: Diagnosis not present

## 2017-10-28 DIAGNOSIS — I10 Essential (primary) hypertension: Secondary | ICD-10-CM | POA: Diagnosis not present

## 2017-10-28 DIAGNOSIS — Z91040 Latex allergy status: Secondary | ICD-10-CM | POA: Diagnosis not present

## 2017-10-28 DIAGNOSIS — Y999 Unspecified external cause status: Secondary | ICD-10-CM | POA: Diagnosis not present

## 2017-10-28 DIAGNOSIS — Z743 Need for continuous supervision: Secondary | ICD-10-CM | POA: Diagnosis not present

## 2017-10-28 DIAGNOSIS — Y92129 Unspecified place in nursing home as the place of occurrence of the external cause: Secondary | ICD-10-CM | POA: Diagnosis not present

## 2017-10-28 DIAGNOSIS — S199XXA Unspecified injury of neck, initial encounter: Secondary | ICD-10-CM | POA: Diagnosis not present

## 2017-10-28 DIAGNOSIS — W01198A Fall on same level from slipping, tripping and stumbling with subsequent striking against other object, initial encounter: Secondary | ICD-10-CM | POA: Insufficient documentation

## 2017-10-28 DIAGNOSIS — R279 Unspecified lack of coordination: Secondary | ICD-10-CM | POA: Diagnosis not present

## 2017-10-28 DIAGNOSIS — M542 Cervicalgia: Secondary | ICD-10-CM | POA: Diagnosis not present

## 2017-10-28 DIAGNOSIS — W19XXXA Unspecified fall, initial encounter: Secondary | ICD-10-CM

## 2017-10-28 LAB — CBC
HCT: 43.6 % (ref 36.0–46.0)
Hemoglobin: 14.5 g/dL (ref 12.0–15.0)
MCH: 29.2 pg (ref 26.0–34.0)
MCHC: 33.3 g/dL (ref 30.0–36.0)
MCV: 87.7 fL (ref 78.0–100.0)
Platelets: 175 10*3/uL (ref 150–400)
RBC: 4.97 MIL/uL (ref 3.87–5.11)
RDW: 15 % (ref 11.5–15.5)
WBC: 8 10*3/uL (ref 4.0–10.5)

## 2017-10-28 LAB — BASIC METABOLIC PANEL
Anion gap: 8 (ref 5–15)
BUN: 15 mg/dL (ref 6–20)
CO2: 30 mmol/L (ref 22–32)
Calcium: 9.7 mg/dL (ref 8.9–10.3)
Chloride: 102 mmol/L (ref 101–111)
Creatinine, Ser: 0.95 mg/dL (ref 0.44–1.00)
GFR calc Af Amer: >60 mL/min (ref >60)
GFR calc non Af Amer: 60 mL/min — ABNORMAL LOW (ref >60)
Glucose, Bld: 101 mg/dL — ABNORMAL HIGH (ref 65–99)
Potassium: 4.3 mmol/L (ref 3.5–5.1)
Sodium: 140 mmol/L (ref 135–145)

## 2017-10-28 MED ORDER — KETOROLAC TROMETHAMINE 15 MG/ML IJ SOLN: 15.0000 mg | Freq: Once | INTRAMUSCULAR | Status: DC

## 2017-10-28 MED ORDER — KETOROLAC TROMETHAMINE 30 MG/ML IJ SOLN
30.0000 mg | Freq: Once | INTRAMUSCULAR | Status: AC
  Administered 2017-10-28: 30 mg via INTRAMUSCULAR
  Filled 2017-10-28: qty 1

## 2017-10-28 NOTE — ED Provider Notes (Signed)
[facility] EMERGENCY DEPARTMENT Provider Note   CSN: [id number] Arrival date & time: 10/28/17  6761  History   Chief Complaint Chief Complaint  Patient presents with  . Fall    HPI Sheryl [name] is a 72 y.o. female past history of sarcoidosis on chronic prednisone, spinal stenosis with prior lumbar surgeries, CLL, hypertension who presented following a mechanical fall.  Patient was at her facility and states she was walking while not using her walker (also sometimes uses wheelchair).  She tried to turn and lost her balance falling backwards in her and hitting her head on the floor.  Denies loss conscious, nausea, vomiting.  She denies any prodromal symptoms leading up to fall including feelings of near syncope, palpitations, chest pain, shortness of breath.  Patient's niece is present and states she has had multiple falls in the past as well.  Chart review shows she was seen in October 2018 for mechanical fall while not using her walker as well.  Past Medical History:  Diagnosis Date  . Allergy   . Cataract   . 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 DEGENERATIVE JOINT DISEASE 12/14/2006  . EXOGENOUS OBESITY 12/14/2006  . FIBROMYALGIA 12/14/2006  . Gallstones   . Hearing loss of left ear   . HYPERLIPIDEMIA 12/14/2006  . HYPERTENSION 12/14/2006  . IBS (irritable bowel syndrome)   . Neurosarcoidosis   . Sinusitis nasal 06/24/2016  . Sleep apnea    no CPAP  . Vision loss of right eye     Patient Active Problem List   Diagnosis Date Noted  . Sinusitis nasal 06/24/2016  . History of IBS 10/31/2014  . 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 Back pain 03/26/2014  . Preventive measure 03/26/2014  . Spinal stenosis 04/17/2012  . 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 Chronic lymphocytic leukemia ([place]) 12/14/2006  . Dyslipidemia 12/14/2006  . EXOGENOUS OBESITY 12/14/2006  . Essential hypertension 12/14/2006  .  Osteoarthritis 12/14/2006  . FIBROMYALGIA 12/14/2006    Past Surgical History:  Procedure Laterality Date  . BRAIN BIOPSY     [facility]  . CERVICAL FUSION    . CHOLECYSTECTOMY    . COLONOSCOPY    . LUMBAR FUSION  07/25/2012  . TONSILLECTOMY       OB History   None      Home Medications    Prior to Admission medications   Medication Sig Start Date End Date Taking? Authorizing Provider  acetaminophen (TYLENOL) 325 MG tablet Take 650 mg by mouth every 6 (six) hours as needed.   Yes [provider]  aspirin 81 MG tablet Take 81 mg by mouth daily.     Yes [provider]  atorvastatin (LIPITOR) 40 MG tablet Take 1 tablet (40 mg total) by mouth daily. Patient taking differently: Take 40 mg by mouth at bedtime.  08/10/16  Yes Marletta Lor, MD  brimonidine (ALPHAGAN) 0.15 % ophthalmic solution Place 1 drop into the left eye 3 (three) times daily.  09/03/14  Yes [provider]  Cholecalciferol (VITAMIN D3) 2000 units capsule Take 2,000 Units by mouth daily.   Yes [provider]  feeding supplement (BOOST HIGH PROTEIN) LIQD Take 1 Container by mouth 2 (two) times daily.   Yes [provider]  ferrous sulfate 325 (65 FE) MG tablet Take 325 mg by mouth daily with breakfast.   Yes [provider]  FLUoxetine (PROZAC) 40 MG capsule Take 40 mg by  mouth at bedtime.    Yes [provider]  fluticasone (FLONASE) 50 MCG/ACT nasal spray Place 1 spray into both nostrils daily as needed for allergies.  10/16/12  Yes [provider]  loratadine (CLARITIN) 10 MG tablet Take 10 mg by mouth daily as needed for allergies.   Yes [provider]  magnesium oxide (MAG-OX) 400 MG tablet Take 400 mg by mouth daily.   Yes [provider]  Multiple Vitamin (MULTI-VITAMINS) TABS Take 1 tablet by mouth daily.   Yes [provider]  OLANZapine (ZYPREXA) 7.5 MG tablet Take 7.5 mg by mouth at bedtime.    Yes  [provider]  pantoprazole (PROTONIX) 40 MG tablet Take 40 mg by mouth daily.   Yes [provider]  polyethylene glycol (MIRALAX / GLYCOLAX) packet Take 17 g by mouth daily as needed.    Yes [provider]  potassium chloride (K-DUR,KLOR-CON) 10 MEQ tablet Take 10 mEq by mouth daily. 04/07/17  Yes [provider]  zinc oxide 20 % ointment Apply 1 application topically as needed for irritation (apply to buttocks).   Yes [provider]  vitamin B-12 (CYANOCOBALAMIN) 1000 MCG tablet Take 1 tablet (1,000 mcg total) by mouth daily. Patient not taking: Reported on 04/15/2017 02/06/17   Marletta Lor, MD    Family History Family History  Problem Relation Age of Onset  . Diabetes Sister   . Hypertension Mother   . Emphysema Father   . Colon cancer Neg Hx   . Esophageal cancer Neg Hx   . Rectal cancer Neg Hx   . Stomach cancer Neg Hx     Social History Social History   Tobacco Use  . Smoking status: Never Smoker  . Smokeless tobacco: Never Used  Substance Use Topics  . Alcohol use: No    Alcohol/week: 0.0 oz    Comment: One drink per year  . Drug use: No     Allergies   Adhesive [tape]; Latex; Cyclophosphamide; and Sulfa antibiotics   Review of Systems Review of Systems  Constitutional: Negative for diaphoresis and fever.  Eyes: Negative for visual disturbance.  Respiratory: Negative for shortness of breath.   Cardiovascular: Negative for chest pain and palpitations.  Gastrointestinal: Negative for nausea and vomiting.  Neurological: Negative for syncope.     Physical Exam Updated Vital Signs BP (!) 162/115   Pulse (!) 56   Temp 97.7 F (36.5 C) (Oral)   Resp 18   Ht 5\' 2"  (1.575 m)   Wt 81.6 kg (180 lb)   SpO2 97%   BMI 32.92 kg/m   General: Elderly female in C collar, no acute distress Head: Normocephalic, dried blood to posterior R scalp-not well visualized with C collar in place. On re-eval, no  laceration or significant skin injury identified  Eyes: PERRL, EOMI ENT: Moist mucus membranes, no exudate CV: RRR, s1, s2 Resp: Clear breath sounds bilaterally , normal work of breathing, no distress  Abd: Soft, +BS, non-tender to palpation  Extr: No apparent injuries or pain, full ROM of extremities, symmetric 5/5 upper extremity strength and 4+/5 LE strength  Neuro: Alert and oriented x3, answering appropriately, no facial asymmetry, full sensation to extremities, face, intact FTN, no focal deficits identified  Skin: Warm, dry     ED Treatments / Results  Labs (all labs ordered are listed, but only abnormal results are displayed) Labs Reviewed  BASIC METABOLIC PANEL - Abnormal; Notable for the following components:  Result Value   Glucose, Bld 101 (*)    GFR calc non Af Amer 60 (*)    All other components within normal limits  CBC    EKG None  Radiology Ct Head Wo Contrast  Result Date: 10/28/2017 CLINICAL DATA:  Fall. EXAM: CT HEAD WITHOUT CONTRAST CT CERVICAL SPINE WITHOUT CONTRAST TECHNIQUE: Multidetector CT imaging of the head and cervical spine was performed following the standard protocol without intravenous contrast. Multiplanar CT image reconstructions of the cervical spine were also generated. COMPARISON:  CT head and cervical spine dated April 15, 2017. FINDINGS: CT HEAD FINDINGS Brain: No evidence of acute infarction, hemorrhage, hydrocephalus, extra-axial collection or mass lesion/mass effect. Stable atrophy and mild chronic microvascular ischemic changes. Unchanged empty sella. Vascular: No hyperdense vessel or unexpected calcification. Skull: Negative for fracture or focal lesion. Prior right temporal craniotomy. Sinuses/Orbits: Air-fluid level in the left maxillary sinus. No acute orbital abnormality. Other: Small soft tissue hematoma the posterior scalp. Moderate bilateral TMJ osteoarthritis. CT CERVICAL SPINE FINDINGS Alignment: Unchanged grade 1 anterolisthesis  at C7-T1 due to severe facet arthropathy. No traumatic malalignment. Skull base and vertebrae: No acute fracture. No primary bone lesion or focal pathologic process. Soft tissues and spinal canal: No prevertebral fluid or swelling. No visible canal hematoma. Disc levels: Prior C3-C7 ACDF with solid osseous fusion. No hardware complication. Small disc bulge at C2-C3. Mild multilevel bilateral facet arthropathy. Upper chest: Negative. Other: None. IMPRESSION: 1.  No acute intracranial abnormality. 2.  No acute cervical spine fracture.  Prior C3-C7 ACDF. 3. Air-fluid level in the left maxillary sinus. Correlate for acute sinusitis. Electronically Signed   By: Titus Dubin M.D.   On: 10/28/2017 11:37   Ct Cervical Spine Wo Contrast  Result Date: 10/28/2017 CLINICAL DATA:  Fall. EXAM: CT HEAD WITHOUT CONTRAST CT CERVICAL SPINE WITHOUT CONTRAST TECHNIQUE: Multidetector CT imaging of the head and cervical spine was performed following the standard protocol without intravenous contrast. Multiplanar CT image reconstructions of the cervical spine were also generated. COMPARISON:  CT head and cervical spine dated April 15, 2017. FINDINGS: CT HEAD FINDINGS Brain: No evidence of acute infarction, hemorrhage, hydrocephalus, extra-axial collection or mass lesion/mass effect. Stable atrophy and mild chronic microvascular ischemic changes. Unchanged empty sella. Vascular: No hyperdense vessel or unexpected calcification. Skull: Negative for fracture or focal lesion. Prior right temporal craniotomy. Sinuses/Orbits: Air-fluid level in the left maxillary sinus. No acute orbital abnormality. Other: Small soft tissue hematoma the posterior scalp. Moderate bilateral TMJ osteoarthritis. CT CERVICAL SPINE FINDINGS Alignment: Unchanged grade 1 anterolisthesis at C7-T1 due to severe facet arthropathy. No traumatic malalignment. Skull base and vertebrae: No acute fracture. No primary bone lesion or focal pathologic process. Soft  tissues and spinal canal: No prevertebral fluid or swelling. No visible canal hematoma. Disc levels: Prior C3-C7 ACDF with solid osseous fusion. No hardware complication. Small disc bulge at C2-C3. Mild multilevel bilateral facet arthropathy. Upper chest: Negative. Other: None. IMPRESSION: 1.  No acute intracranial abnormality. 2.  No acute cervical spine fracture.  Prior C3-C7 ACDF. 3. Air-fluid level in the left maxillary sinus. Correlate for acute sinusitis. Electronically Signed   By: Titus Dubin M.D.   On: 10/28/2017 11:37    Procedures Procedures (including critical care time)  Medications Ordered in ED Medications  ketorolac (TORADOL) 30 MG/ML injection 30 mg (30 mg Intramuscular Given 10/28/17 1155)     Initial Impression / Assessment and Plan / ED Course  I have reviewed the triage vital signs and the nursing  notes.  Pertinent labs & imaging results that were available during my care of the patient were reviewed by me and considered in my medical decision making (see chart for details).  72 year old female presenting with a mechanical fall resulting in a posterior scalp injury and neck pain.  She has a history of similar presentations and was again not using her walker.  No prodromal symptoms to suggest an etiology other than mechanical mechanism described.  History of prior spinal surgeries due to spinal stenosis.  Patient is not on anticoagulation based on available med rec. CT imaging of head and cervical spine obtained to rule out intracranial bleed, fracture. IM toradol provided for pain relief.  CT imaging negative for acute fracture or intracranial process, mental status remains stable throughout ED stay.  On cleaning and reevaluation of posterior scalp wound, no underlying laceration identified.  May have been superficial abrasion.  Stable for discharge, recommend NSAIDs/Tylenol for pain control given several sedating meds already on medication list. Pt and niece updated with  plan and agreeable.    Final Clinical Impressions(s) / ED Diagnoses   Final diagnoses:  Fall, initial encounter    ED Discharge Orders    None       Tawny Asal, MD 10/28/17 1257    Lajean Saver, MD 10/28/17 1524

## 2017-10-28 NOTE — ED Notes (Signed)
Patient transported to CT 

## 2017-10-28 NOTE — Discharge Instructions (Signed)
Nice to meet you Sheryl [name]. The CT scans looked good with no broken bones or an injury in your head.  There was also no cut  on the back your head that needed any stitches.  It will be important that you continue to use your walker and wheelchair, as well as asking for assistance while at your facility to avoid any further falls or other injury.  He will likely be sore and for your pain you can use ibuprofen (800 mg up to 3 times a day) and Tylenol (1000 mg up to 3 times a day).   You can also use ice or padding to the back your head or neck to help with the soreness

## 2017-10-28 NOTE — ED Notes (Signed)
ED Provider at bedside. 

## 2017-10-28 NOTE — ED Notes (Signed)
Warm water used to clean pt abrasion.

## 2017-10-28 NOTE — ED Notes (Signed)
IV access attempted X2 without success. 

## 2017-10-28 NOTE — ED Triage Notes (Signed)
Pt arrives via ems from [facility] on [address] with reports of witnessed fall by nursing staff. Pt with hematoma posterior head. Pt denies LOC. Pt reports she tripped and fell backwards. Pt in ccollar upon arrival. 184/76, HR 80's, 97% RA. Pt in NAD.

## 2017-10-30 DIAGNOSIS — M6281 Muscle weakness (generalized): Secondary | ICD-10-CM | POA: Diagnosis not present

## 2017-10-30 DIAGNOSIS — R2689 Other abnormalities of gait and mobility: Secondary | ICD-10-CM | POA: Diagnosis not present

## 2017-10-30 DIAGNOSIS — I10 Essential (primary) hypertension: Secondary | ICD-10-CM | POA: Diagnosis not present

## 2017-10-30 DIAGNOSIS — R41841 Cognitive communication deficit: Secondary | ICD-10-CM | POA: Diagnosis not present

## 2017-10-30 DIAGNOSIS — R4702 Dysphasia: Secondary | ICD-10-CM | POA: Diagnosis not present

## 2017-10-30 DIAGNOSIS — F0390 Unspecified dementia without behavioral disturbance: Secondary | ICD-10-CM | POA: Diagnosis not present

## 2017-11-01 DIAGNOSIS — R2689 Other abnormalities of gait and mobility: Secondary | ICD-10-CM | POA: Diagnosis not present

## 2017-11-01 DIAGNOSIS — I10 Essential (primary) hypertension: Secondary | ICD-10-CM | POA: Diagnosis not present

## 2017-11-01 DIAGNOSIS — R41841 Cognitive communication deficit: Secondary | ICD-10-CM | POA: Diagnosis not present

## 2017-11-01 DIAGNOSIS — F0390 Unspecified dementia without behavioral disturbance: Secondary | ICD-10-CM | POA: Diagnosis not present

## 2017-11-01 DIAGNOSIS — R4702 Dysphasia: Secondary | ICD-10-CM | POA: Diagnosis not present

## 2017-11-01 DIAGNOSIS — M6281 Muscle weakness (generalized): Secondary | ICD-10-CM | POA: Diagnosis not present

## 2017-11-02 DIAGNOSIS — R41841 Cognitive communication deficit: Secondary | ICD-10-CM | POA: Diagnosis not present

## 2017-11-02 DIAGNOSIS — R4702 Dysphasia: Secondary | ICD-10-CM | POA: Diagnosis not present

## 2017-11-02 DIAGNOSIS — R2689 Other abnormalities of gait and mobility: Secondary | ICD-10-CM | POA: Diagnosis not present

## 2017-11-02 DIAGNOSIS — I10 Essential (primary) hypertension: Secondary | ICD-10-CM | POA: Diagnosis not present

## 2017-11-02 DIAGNOSIS — M6281 Muscle weakness (generalized): Secondary | ICD-10-CM | POA: Diagnosis not present

## 2017-11-02 DIAGNOSIS — F0390 Unspecified dementia without behavioral disturbance: Secondary | ICD-10-CM | POA: Diagnosis not present

## 2017-11-03 DIAGNOSIS — I10 Essential (primary) hypertension: Secondary | ICD-10-CM | POA: Diagnosis not present

## 2017-11-03 DIAGNOSIS — M6281 Muscle weakness (generalized): Secondary | ICD-10-CM | POA: Diagnosis not present

## 2017-11-03 DIAGNOSIS — R2689 Other abnormalities of gait and mobility: Secondary | ICD-10-CM | POA: Diagnosis not present

## 2017-11-03 DIAGNOSIS — R41841 Cognitive communication deficit: Secondary | ICD-10-CM | POA: Diagnosis not present

## 2017-11-03 DIAGNOSIS — R4702 Dysphasia: Secondary | ICD-10-CM | POA: Diagnosis not present

## 2017-11-03 DIAGNOSIS — F0390 Unspecified dementia without behavioral disturbance: Secondary | ICD-10-CM | POA: Diagnosis not present

## 2017-11-06 DIAGNOSIS — M6281 Muscle weakness (generalized): Secondary | ICD-10-CM | POA: Diagnosis not present

## 2017-11-06 DIAGNOSIS — R2689 Other abnormalities of gait and mobility: Secondary | ICD-10-CM | POA: Diagnosis not present

## 2017-11-06 DIAGNOSIS — F0390 Unspecified dementia without behavioral disturbance: Secondary | ICD-10-CM | POA: Diagnosis not present

## 2017-11-06 DIAGNOSIS — L603 Nail dystrophy: Secondary | ICD-10-CM | POA: Diagnosis not present

## 2017-11-06 DIAGNOSIS — R2681 Unsteadiness on feet: Secondary | ICD-10-CM | POA: Diagnosis not present

## 2017-11-06 DIAGNOSIS — R41841 Cognitive communication deficit: Secondary | ICD-10-CM | POA: Diagnosis not present

## 2017-11-06 DIAGNOSIS — M79673 Pain in unspecified foot: Secondary | ICD-10-CM | POA: Diagnosis not present

## 2017-11-06 DIAGNOSIS — I10 Essential (primary) hypertension: Secondary | ICD-10-CM | POA: Diagnosis not present

## 2017-11-06 DIAGNOSIS — L851 Acquired keratosis [keratoderma] palmaris et plantaris: Secondary | ICD-10-CM | POA: Diagnosis not present

## 2017-11-06 DIAGNOSIS — R4702 Dysphasia: Secondary | ICD-10-CM | POA: Diagnosis not present

## 2017-11-08 DIAGNOSIS — R4702 Dysphasia: Secondary | ICD-10-CM | POA: Diagnosis not present

## 2017-11-08 DIAGNOSIS — I10 Essential (primary) hypertension: Secondary | ICD-10-CM | POA: Diagnosis not present

## 2017-11-08 DIAGNOSIS — G4700 Insomnia, unspecified: Secondary | ICD-10-CM | POA: Diagnosis not present

## 2017-11-08 DIAGNOSIS — F0390 Unspecified dementia without behavioral disturbance: Secondary | ICD-10-CM | POA: Diagnosis not present

## 2017-11-08 DIAGNOSIS — F419 Anxiety disorder, unspecified: Secondary | ICD-10-CM | POA: Diagnosis not present

## 2017-11-08 DIAGNOSIS — M6281 Muscle weakness (generalized): Secondary | ICD-10-CM | POA: Diagnosis not present

## 2017-11-08 DIAGNOSIS — R41841 Cognitive communication deficit: Secondary | ICD-10-CM | POA: Diagnosis not present

## 2017-11-08 DIAGNOSIS — R2689 Other abnormalities of gait and mobility: Secondary | ICD-10-CM | POA: Diagnosis not present

## 2017-11-08 DIAGNOSIS — F251 Schizoaffective disorder, depressive type: Secondary | ICD-10-CM | POA: Diagnosis not present

## 2017-11-13 DIAGNOSIS — M6281 Muscle weakness (generalized): Secondary | ICD-10-CM | POA: Diagnosis not present

## 2017-11-13 DIAGNOSIS — F0390 Unspecified dementia without behavioral disturbance: Secondary | ICD-10-CM | POA: Diagnosis not present

## 2017-11-13 DIAGNOSIS — R41841 Cognitive communication deficit: Secondary | ICD-10-CM | POA: Diagnosis not present

## 2017-11-13 DIAGNOSIS — I10 Essential (primary) hypertension: Secondary | ICD-10-CM | POA: Diagnosis not present

## 2017-11-13 DIAGNOSIS — R2689 Other abnormalities of gait and mobility: Secondary | ICD-10-CM | POA: Diagnosis not present

## 2017-11-13 DIAGNOSIS — R4702 Dysphasia: Secondary | ICD-10-CM | POA: Diagnosis not present

## 2017-11-16 DIAGNOSIS — M6281 Muscle weakness (generalized): Secondary | ICD-10-CM | POA: Diagnosis not present

## 2017-11-16 DIAGNOSIS — R2689 Other abnormalities of gait and mobility: Secondary | ICD-10-CM | POA: Diagnosis not present

## 2017-11-16 DIAGNOSIS — R4702 Dysphasia: Secondary | ICD-10-CM | POA: Diagnosis not present

## 2017-11-16 DIAGNOSIS — R41841 Cognitive communication deficit: Secondary | ICD-10-CM | POA: Diagnosis not present

## 2017-11-16 DIAGNOSIS — F0390 Unspecified dementia without behavioral disturbance: Secondary | ICD-10-CM | POA: Diagnosis not present

## 2017-11-16 DIAGNOSIS — I10 Essential (primary) hypertension: Secondary | ICD-10-CM | POA: Diagnosis not present

## 2017-11-23 DIAGNOSIS — M4325 Fusion of spine, thoracolumbar region: Secondary | ICD-10-CM | POA: Diagnosis not present

## 2017-12-13 ENCOUNTER — Non-Acute Institutional Stay: Payer: Self-pay | Admitting: Hospice and Palliative Medicine

## 2017-12-13 DIAGNOSIS — F329 Major depressive disorder, single episode, unspecified: Secondary | ICD-10-CM | POA: Diagnosis not present

## 2017-12-13 DIAGNOSIS — Z515 Encounter for palliative care: Secondary | ICD-10-CM | POA: Diagnosis not present

## 2017-12-13 NOTE — Progress Notes (Signed)
PALLIATIVE CARE CONSULT VISIT   PATIENT NAME: Sheryl [name] DOB: 29-Jan-1946 MRN: [id number]  PRIMARY CARE PROVIDER:   Marletta Lor, MD  REFERRING PROVIDER:  Ferd Hibbs, NP  RESPONSIBLE PARTY:   Tiny Chaudhary - niece - [phone]  ASSESSMENT:     Patient has no acute complaints. She feels she has been doing well. Staff also deny concerns. Patient reportedly has been eating well. Moods are stable.   RECOMMENDATIONS and PLAN:  1. Will follow  I spent 15 minutes providing this consultation,  from 1115 to 1130. More than 50% of the time in this consultation was spent coordinating communication.   HISTORY OF PRESENT ILLNESS:  Sheryl [name] is a 72 yo woman with multiple medical problems including neurosarcoidosis, CLL, fibromyalgia, who was hostpialized 10/2016 at [facility] with lower back surgery and was rehospitalized 11/28/16 to 12/05/16 at [facility] for AMS. Workup for etiology was unrevealing. Symptoms were felt to be chronic and patient was discharged to ALF with PT following. Palliative care has been asked to assist with clarifying goals. Routine follow up visit made today. Note recent ER visit for falls. Otherwise, patient appears to be back to baseline.   CODE STATUS: DNR  PPS: 40% HOSPICE ELIGIBILITY/DIAGNOSIS: TBD  PAST MEDICAL HISTORY:  Past Medical History:  Diagnosis Date  . Allergy   . Cataract   . 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 DEGENERATIVE JOINT DISEASE 12/14/2006  . EXOGENOUS OBESITY 12/14/2006  . FIBROMYALGIA 12/14/2006  . Gallstones   . Hearing loss of left ear   . HYPERLIPIDEMIA 12/14/2006  . HYPERTENSION 12/14/2006  . IBS (irritable bowel syndrome)   . Neurosarcoidosis   . Sinusitis nasal 06/24/2016  . Sleep apnea    no CPAP  . Vision loss of right eye     SOCIAL HX:  Social History   Tobacco Use  . Smoking status: Never Smoker  . Smokeless tobacco: Never Used  Substance Use Topics  . Alcohol use: No    Alcohol/week: 0.0 oz    Comment: One drink per year     ALLERGIES:  Allergies  Allergen Reactions  . Adhesive [Tape] Rash    Redness from tape.   . Latex Anaphylaxis, Rash and Other (See Comments)    Added based on information entered during case entry, please review and add reactions, type, and severity as needed. Patient cannot confirm this allergy.  . Cyclophosphamide Other (See Comments)    Caused diverticulitis.  She would not eat or drink for days, while taking it.  . Sulfa Antibiotics     PER MAR     PERTINENT MEDICATIONS:  Outpatient Encounter Medications as of 12/13/2017  Medication Sig  . acetaminophen (TYLENOL) 325 MG tablet Take 650 mg by mouth every 6 (six) hours as needed.  Marland Kitchen aspirin 81 MG tablet Take 81 mg by mouth daily.    Marland Kitchen atorvastatin (LIPITOR) 40 MG tablet Take 1 tablet (40 mg total) by mouth daily. (Patient taking differently: Take 40 mg by mouth at bedtime. )  . brimonidine (ALPHAGAN) 0.15 % ophthalmic solution Place 1 drop into the left eye 3 (three) times daily.   . Cholecalciferol (VITAMIN D3) 2000 units capsule Take 2,000 Units by mouth daily.  . feeding supplement (BOOST HIGH PROTEIN) LIQD Take 1 Container by mouth 2 (two) times daily.  . ferrous sulfate 325 (65 FE) MG tablet Take 325 mg by mouth daily with breakfast.  . FLUoxetine (PROZAC) 40 MG capsule Take 40 mg by mouth at bedtime.   Marland Kitchen  fluticasone (FLONASE) 50 MCG/ACT nasal spray Place 1 spray into both nostrils daily as needed for allergies.   Marland Kitchen loratadine (CLARITIN) 10 MG tablet Take 10 mg by mouth daily as needed for allergies.  . magnesium oxide (MAG-OX) 400 MG tablet Take 400 mg by mouth daily.  . Multiple Vitamin (MULTI-VITAMINS) TABS Take 1 tablet by mouth daily.  Marland Kitchen OLANZapine (ZYPREXA) 7.5 MG tablet Take 7.5 mg by mouth at bedtime.   . pantoprazole (PROTONIX) 40 MG tablet Take 40 mg by mouth daily.  . polyethylene glycol (MIRALAX / GLYCOLAX) packet Take 17 g by mouth daily as needed.   . potassium chloride (K-DUR,KLOR-CON) 10 MEQ tablet Take 10  mEq by mouth daily.  . vitamin B-12 (CYANOCOBALAMIN) 1000 MCG tablet Take 1 tablet (1,000 mcg total) by mouth daily. (Patient not taking: Reported on 04/15/2017)  . zinc oxide 20 % ointment Apply 1 application topically as needed for irritation (apply to buttocks).   No facility-administered encounter medications on file as of 12/13/2017.     PHYSICAL EXAM:   General: NAD, frail appearing, thin, lying in bed Cardiovascular: regular rate and rhythm Pulmonary: clear ant fields Abdomen: soft, nontender, + bowel sounds Extremities: no edema, no joint deformities Skin: no rashes Neurological: Weakness, speech clear, alert and oriented  Irean Hong, NP

## 2017-12-20 DIAGNOSIS — G4700 Insomnia, unspecified: Secondary | ICD-10-CM | POA: Diagnosis not present

## 2017-12-20 DIAGNOSIS — F251 Schizoaffective disorder, depressive type: Secondary | ICD-10-CM | POA: Diagnosis not present

## 2017-12-20 DIAGNOSIS — F419 Anxiety disorder, unspecified: Secondary | ICD-10-CM | POA: Diagnosis not present

## 2018-01-08 DIAGNOSIS — R2681 Unsteadiness on feet: Secondary | ICD-10-CM | POA: Diagnosis not present

## 2018-01-08 DIAGNOSIS — G9340 Encephalopathy, unspecified: Secondary | ICD-10-CM | POA: Diagnosis not present

## 2018-01-08 DIAGNOSIS — L603 Nail dystrophy: Secondary | ICD-10-CM | POA: Diagnosis not present

## 2018-01-08 DIAGNOSIS — L851 Acquired keratosis [keratoderma] palmaris et plantaris: Secondary | ICD-10-CM | POA: Diagnosis not present

## 2018-01-08 DIAGNOSIS — M79673 Pain in unspecified foot: Secondary | ICD-10-CM | POA: Diagnosis not present

## 2018-01-31 DIAGNOSIS — F419 Anxiety disorder, unspecified: Secondary | ICD-10-CM | POA: Diagnosis not present

## 2018-01-31 DIAGNOSIS — R630 Anorexia: Secondary | ICD-10-CM | POA: Diagnosis not present

## 2018-01-31 DIAGNOSIS — F251 Schizoaffective disorder, depressive type: Secondary | ICD-10-CM | POA: Diagnosis not present

## 2018-01-31 DIAGNOSIS — G4700 Insomnia, unspecified: Secondary | ICD-10-CM | POA: Diagnosis not present

## 2018-02-07 ENCOUNTER — Emergency Department (HOSPITAL_COMMUNITY): Payer: Medicare Other

## 2018-02-07 ENCOUNTER — Emergency Department (HOSPITAL_COMMUNITY)
Admission: EM | Admit: 2018-02-07 | Discharge: 2018-02-08 | Disposition: A | Discharge status: Skilled Nursing Facility | Payer: Medicare Other | Attending: Emergency Medicine | Admitting: Emergency Medicine

## 2018-02-07 ENCOUNTER — Other Ambulatory Visit: Payer: Self-pay

## 2018-02-07 ENCOUNTER — Encounter (HOSPITAL_COMMUNITY): Payer: Self-pay | Admitting: Emergency Medicine

## 2018-02-07 DIAGNOSIS — Y999 Unspecified external cause status: Secondary | ICD-10-CM | POA: Diagnosis not present

## 2018-02-07 DIAGNOSIS — Z91040 Latex allergy status: Secondary | ICD-10-CM | POA: Insufficient documentation

## 2018-02-07 DIAGNOSIS — Z743 Need for continuous supervision: Secondary | ICD-10-CM | POA: Diagnosis not present

## 2018-02-07 DIAGNOSIS — Y939 Activity, unspecified: Secondary | ICD-10-CM | POA: Insufficient documentation

## 2018-02-07 DIAGNOSIS — M79642 Pain in left hand: Secondary | ICD-10-CM | POA: Diagnosis not present

## 2018-02-07 DIAGNOSIS — W19XXXA Unspecified fall, initial encounter: Secondary | ICD-10-CM

## 2018-02-07 DIAGNOSIS — R279 Unspecified lack of coordination: Secondary | ICD-10-CM | POA: Diagnosis not present

## 2018-02-07 DIAGNOSIS — I10 Essential (primary) hypertension: Secondary | ICD-10-CM | POA: Diagnosis not present

## 2018-02-07 DIAGNOSIS — R296 Repeated falls: Secondary | ICD-10-CM | POA: Diagnosis not present

## 2018-02-07 DIAGNOSIS — S0083XA Contusion of other part of head, initial encounter: Secondary | ICD-10-CM

## 2018-02-07 DIAGNOSIS — W1830XA Fall on same level, unspecified, initial encounter: Secondary | ICD-10-CM | POA: Insufficient documentation

## 2018-02-07 DIAGNOSIS — S0993XA Unspecified injury of face, initial encounter: Secondary | ICD-10-CM | POA: Diagnosis not present

## 2018-02-07 DIAGNOSIS — S0990XA Unspecified injury of head, initial encounter: Secondary | ICD-10-CM | POA: Diagnosis not present

## 2018-02-07 DIAGNOSIS — S6992XA Unspecified injury of left wrist, hand and finger(s), initial encounter: Secondary | ICD-10-CM | POA: Diagnosis not present

## 2018-02-07 DIAGNOSIS — Z79899 Other long term (current) drug therapy: Secondary | ICD-10-CM | POA: Diagnosis not present

## 2018-02-07 DIAGNOSIS — Z043 Encounter for examination and observation following other accident: Secondary | ICD-10-CM | POA: Diagnosis present

## 2018-02-07 DIAGNOSIS — M25532 Pain in left wrist: Secondary | ICD-10-CM | POA: Diagnosis not present

## 2018-02-07 DIAGNOSIS — R040 Epistaxis: Secondary | ICD-10-CM | POA: Diagnosis not present

## 2018-02-07 DIAGNOSIS — Y92129 Unspecified place in nursing home as the place of occurrence of the external cause: Secondary | ICD-10-CM | POA: Diagnosis not present

## 2018-02-07 LAB — CBC WITH DIFFERENTIAL/PLATELET
Basophils Absolute: 0 10*3/uL (ref 0.0–0.1)
Basophils Relative: 0 %
Eosinophils Absolute: 0.1 10*3/uL (ref 0.0–0.7)
Eosinophils Relative: 1 %
HCT: 43.6 % (ref 36.0–46.0)
Hemoglobin: 14.4 g/dL (ref 12.0–15.0)
Lymphocytes Relative: 25 %
Lymphs Abs: 1.8 10*3/uL (ref 0.7–4.0)
MCH: 29.4 pg (ref 26.0–34.0)
MCHC: 33 g/dL (ref 30.0–36.0)
MCV: 89 fL (ref 78.0–100.0)
Monocytes Absolute: 0.5 10*3/uL (ref 0.1–1.0)
Monocytes Relative: 7 %
Neutro Abs: 5 10*3/uL (ref 1.7–7.7)
Neutrophils Relative %: 67 %
Platelets: 170 10*3/uL (ref 150–400)
RBC: 4.9 MIL/uL (ref 3.87–5.11)
RDW: 13.7 % (ref 11.5–15.5)
WBC: 7.4 10*3/uL (ref 4.0–10.5)

## 2018-02-07 LAB — BASIC METABOLIC PANEL
Anion gap: 12 (ref 5–15)
BUN: 26 mg/dL — ABNORMAL HIGH (ref 8–23)
CO2: 27 mmol/L (ref 22–32)
Calcium: 9.5 mg/dL (ref 8.9–10.3)
Chloride: 107 mmol/L (ref 98–111)
Creatinine, Ser: 1.05 mg/dL — ABNORMAL HIGH (ref 0.44–1.00)
GFR calc Af Amer: >60 mL/min (ref >60)
GFR calc non Af Amer: 53 mL/min — ABNORMAL LOW (ref >60)
Glucose, Bld: 97 mg/dL (ref 70–99)
Potassium: 3.7 mmol/L (ref 3.5–5.1)
Sodium: 146 mmol/L — ABNORMAL HIGH (ref 135–145)

## 2018-02-07 MED ORDER — ACETAMINOPHEN 325 MG PO TABS
650.0000 mg | ORAL_TABLET | Freq: Once | ORAL | Status: DC
Start: 2018-02-07 — End: 2018-02-08
  Filled 2018-02-07: qty 2

## 2018-02-07 NOTE — Discharge Instructions (Addendum)
Your Ct scans today were reassuring. Please follow up with a dentist regarding your chipped tooth. I have provided dental resources. If you develop worsening or new concerning symptoms you can return to the emergency department for re-evaluation.

## 2018-02-07 NOTE — ED Notes (Signed)
Bed: [facility] Expected date:  Expected time:  Means of arrival:  Comments: EMS- nursing home pt- fall

## 2018-02-07 NOTE — ED Notes (Signed)
Report was called to [facility] and to [facility].

## 2018-02-07 NOTE — ED Notes (Signed)
Patient refused In and Out Cath.

## 2018-02-07 NOTE — ED Notes (Signed)
Bed: [facility] Expected date:  Expected time:  Means of arrival:  Comments: [facility] 

## 2018-02-07 NOTE — ED Triage Notes (Signed)
Pt arrives via EMS from [place], [address]- EMS reports a fall from standing position. Pt endorses head injury but denies LOC. Pt is not on blood thinners. Pt has complaints of back and neck pain. Pt reports pain a 4/10.

## 2018-02-07 NOTE — ED Notes (Signed)
Attempted to get blood but was unsuccessful 

## 2018-02-07 NOTE — ED Provider Notes (Signed)
[facility] DEPT Provider Note   CSN: [id number] Arrival date & time: 02/07/18  1352     History   Chief Complaint Chief Complaint  Patient presents with  . Fall    HPI Sheryl [name] is a 72 y.o. female who presents from [facility] with a PMHx of hypertension, hyperlipidemia, vision loss of the right eye who presents emergency department today for fall.  Niece (POT) who is at bedside helps provide history. Patient reported to stand from wheelchair to use restroom when she fell face first and landed on her face.  This is witnessed.  No loss of consciousness reported.  She is reportedly supposed to use a walker when going from a sitting to standing position but did not today.  She is complaining of facial pain of her nose, bilateral epistaxis, neck pain, mid back pain, left hand pain.  She does not take anything for symptoms.  She rates her current pain level is a 4/10.  She has had frequent falls over the last several months.  She is not on any blood thinners.  She denies any new visual changes, numbness/tingling/weakness.  She denies any preceding symptoms.  She is currently at her baseline mental status per family.  HPI  Past Medical History:  Diagnosis Date  . Allergy   . Cataract   . 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 DEGENERATIVE JOINT DISEASE 12/14/2006  . EXOGENOUS OBESITY 12/14/2006  . FIBROMYALGIA 12/14/2006  . Gallstones   . Hearing loss of left ear   . HYPERLIPIDEMIA 12/14/2006  . HYPERTENSION 12/14/2006  . IBS (irritable bowel syndrome)   . Neurosarcoidosis   . Sinusitis nasal 06/24/2016  . Sleep apnea    no CPAP  . Vision loss of right eye     Patient Active Problem List   Diagnosis Date Noted  . Sinusitis nasal 06/24/2016  . History of IBS 10/31/2014  . 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 Back pain 03/26/2014  . Preventive measure 03/26/2014  . Spinal stenosis 04/17/2012  . 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 Chronic lymphocytic leukemia ([place]) 12/14/2006  . Dyslipidemia 12/14/2006  . EXOGENOUS OBESITY 12/14/2006  . Essential hypertension 12/14/2006  . Osteoarthritis 12/14/2006  . FIBROMYALGIA 12/14/2006    Past Surgical History:  Procedure Laterality Date  . BRAIN BIOPSY     [facility]  . CERVICAL FUSION    . CHOLECYSTECTOMY    . COLONOSCOPY    . LUMBAR FUSION  07/25/2012  . TONSILLECTOMY       OB History   None      Home Medications    Prior to Admission medications   Medication Sig Start Date End Date Taking? Authorizing Provider  acetaminophen (TYLENOL) 325 MG tablet Take 650 mg by mouth every 6 (six) hours as needed.    [provider]  aspirin 81 MG tablet Take 81 mg by mouth daily.      [provider]  atorvastatin (LIPITOR) 40 MG tablet Take 1 tablet (40 mg total) by mouth daily. Patient taking differently: Take 40 mg by mouth at bedtime.  08/10/16   Marletta Lor, MD  brimonidine (ALPHAGAN) 0.15 % ophthalmic solution Place 1 drop into the left eye 3 (three) times daily.  09/03/14   [provider]  Cholecalciferol (VITAMIN D3) 2000 units capsule Take 2,000 Units by mouth daily.    [provider]  feeding supplement (BOOST HIGH PROTEIN) LIQD Take  1 Container by mouth 2 (two) times daily.    [provider]  ferrous sulfate 325 (65 FE) MG tablet Take 325 mg by mouth daily with breakfast.    [provider]  FLUoxetine (PROZAC) 40 MG capsule Take 40 mg by mouth at bedtime.     [provider]  fluticasone (FLONASE) 50 MCG/ACT nasal spray Place 1 spray into both nostrils daily as needed for allergies.  10/16/12   [provider]  loratadine (CLARITIN) 10 MG tablet Take 10 mg by mouth daily as needed for allergies.    [provider]  magnesium oxide (MAG-OX) 400 MG tablet Take 400 mg by mouth daily.    [provider]  Multiple Vitamin  (MULTI-VITAMINS) TABS Take 1 tablet by mouth daily.    [provider]  OLANZapine (ZYPREXA) 7.5 MG tablet Take 7.5 mg by mouth at bedtime.     [provider]  pantoprazole (PROTONIX) 40 MG tablet Take 40 mg by mouth daily.    [provider]  polyethylene glycol (MIRALAX / GLYCOLAX) packet Take 17 g by mouth daily as needed.     [provider]  potassium chloride (K-DUR,KLOR-CON) 10 MEQ tablet Take 10 mEq by mouth daily. 04/07/17   [provider]  vitamin B-12 (CYANOCOBALAMIN) 1000 MCG tablet Take 1 tablet (1,000 mcg total) by mouth daily. Patient not taking: Reported on 04/15/2017 02/06/17   Marletta Lor, MD  zinc oxide 20 % ointment Apply 1 application topically as needed for irritation (apply to buttocks).    [provider]    Family History Family History  Problem Relation Age of Onset  . Diabetes Sister   . Hypertension Mother   . Emphysema Father   . Colon cancer Neg Hx   . Esophageal cancer Neg Hx   . Rectal cancer Neg Hx   . Stomach cancer Neg Hx     Social History Social History   Tobacco Use  . Smoking status: Never Smoker  . Smokeless tobacco: Never Used  Substance Use Topics  . Alcohol use: No    Alcohol/week: 0.0 standard drinks    Comment: One drink per year  . Drug use: No     Allergies   Adhesive [tape]; Latex; Cyclophosphamide; and Sulfa antibiotics   Review of Systems Review of Systems  All other systems reviewed and are negative.    Physical Exam Updated Vital Signs BP 124/74   Pulse (!) 55   Resp 16   Wt 81.8 kg   SpO2 97%   BMI 32.99 kg/m   Physical Exam  Constitutional: She appears well-developed and well-nourished.  Non-toxic appearing  HENT:  Head: Normocephalic. Head is without raccoon's eyes and without Battle's sign.  Right Ear: Hearing, tympanic membrane and external ear normal. Tympanic membrane is not perforated and not erythematous. No hemotympanum.  Left Ear:  Hearing, tympanic membrane and external ear normal. Tympanic membrane is not perforated and not erythematous. No hemotympanum.  Nose: Sinus tenderness present. No rhinorrhea or nasal septal hematoma. Epistaxis (dried, b/l) is observed. Right sinus exhibits no maxillary sinus tenderness and no frontal sinus tenderness. Left sinus exhibits no maxillary sinus tenderness and no frontal sinus tenderness.    Mouth/Throat: Uvula is midline, oropharynx is clear and moist and mucous membranes are normal.    No CSF otorrhea. There is noted chip of tooth 11. No pulp exposure. No loose teeth.   Eyes: Pupils are equal, round, and reactive to light. Conjunctivae, EOM  and lids are normal. Right eye exhibits no discharge. Left eye exhibits no discharge. Right conjunctiva is not injected. Left conjunctiva is not injected. No scleral icterus. Right eye exhibits normal extraocular motion and no nystagmus. Left eye exhibits normal extraocular motion and no nystagmus.  Neck: Trachea normal and phonation normal. No tracheal deviation present.  Towel around neck. Tenderness at level of C5-6. No step offs noted.   Cardiovascular: Normal rate, regular rhythm, normal heart sounds and intact distal pulses.  Pulses:      Radial pulses are 2+ on the right side, and 2+ on the left side.       Dorsalis pedis pulses are 2+ on the right side, and 2+ on the left side.       Posterior tibial pulses are 2+ on the right side, and 2+ on the left side.  Pulmonary/Chest: Effort normal and breath sounds normal. No respiratory distress.  Abdominal: Soft. Bowel sounds are normal. She exhibits no distension. There is no rigidity, no rebound and no guarding.  Musculoskeletal:       Thoracic back: She exhibits bony tenderness (T1-5).       Hands: No lumbar spinous tenderness palpation or step-offs.  No clavicular deformity.  Passive range of motion of bilateral shoulders, elbows, wrists, fingers, hips, knees, ankles without pain or limited  range of motion.  No deformities noted.  Negative logroll test bilaterally.  No sacral crepitus.  No leg shortening or external rotation.  Compartments soft for upper and lower extremities.  She is neurovascular intact upper and lower extremities.  Neurological: She is alert. She has normal strength. No sensory deficit.  Speech clear. Follows commands. No facial droop. Baseline blindness of right eye. EOM intact and without entrapment. Grossly moves all extremities 4 without ataxia. Able and appropriate strength for age to upper and lower extremities bilaterally  Skin: Ecchymosis noted. No abrasion noted. She is not diaphoretic. No pallor.  Psychiatric: She has a normal mood and affect.  Nursing note and vitals reviewed.    ED Treatments / Results  Labs (all labs ordered are listed, but only abnormal results are displayed) Labs Reviewed  BASIC METABOLIC PANEL - Abnormal; Notable for the following components:      Result Value   Sodium 146 (*)    BUN 26 (*)    Creatinine, Ser 1.05 (*)    GFR calc non Af Amer 53 (*)    All other components within normal limits  URINE CULTURE  CBC WITH DIFFERENTIAL/PLATELET  URINALYSIS, ROUTINE W REFLEX MICROSCOPIC    EKG None  Radiology Dg Wrist Complete Left  Result Date: 02/07/2018 CLINICAL DATA:  Fall with wrist pain EXAM: LEFT WRIST - COMPLETE 3+ VIEW COMPARISON:  None. FINDINGS: There is no evidence of fracture or dislocation. There is no evidence of arthropathy or other focal bone abnormality. Soft tissues are unremarkable. IMPRESSION: Negative. Electronically Signed   By: Donavan Foil M.D.   On: 02/07/2018 16:15   Ct Head Wo Contrast  Result Date: 02/07/2018 CLINICAL DATA:  Head trauma with multiple falls EXAM: CT HEAD WITHOUT CONTRAST CT MAXILLOFACIAL WITHOUT CONTRAST TECHNIQUE: Multidetector CT imaging of the head and maxillofacial structures were performed using the standard protocol without intravenous contrast. Multiplanar CT image  reconstructions of the maxillofacial structures were also generated. COMPARISON:  Head CT 10/28/2017 FINDINGS: CT HEAD FINDINGS Brain: There is no mass, hemorrhage or extra-axial collection. The size and configuration of the ventricles and extra-axial CSF spaces are normal. There is no  acute or chronic infarction. There is mild hypoattenuation of the periventricular white matter, most commonly indicating chronic ischemic microangiopathy. Vascular: No hyperdense vessel or unexpected vascular calcification. Skull: Old inferior right craniotomy site.  No acute fracture. CT MAXILLOFACIAL FINDINGS Osseous: --Complex facial fracture types: No LeFort, zygomaticomaxillary complex or nasoorbitoethmoidal fracture. --Simple fracture types: None. --Mandible, hard palate and teeth: No acute abnormality.Degenerative change of the right temporomandibular joint. Orbits: The globes are intact. Normal appearance of the intra- and extraconal fat. Symmetric extraocular muscles. Sinuses: No fluid levels or advanced mucosal thickening. Soft tissues: Normal visualized extracranial soft tissues. IMPRESSION: 1. No acute intracranial abnormality. 2. No acute fracture of the skull or face. 3. Mild chronic small vessel disease. Electronically Signed   By: Ulyses Jarred M.D.   On: 02/07/2018 16:43   Ct Cervical Spine Wo Contrast  Result Date: 02/07/2018 CLINICAL DATA:  Multiple falls. EXAM: CT CERVICAL SPINE WITHOUT CONTRAST CT THORACIC SPINE WITHOUT CONTRAST TECHNIQUE: Multidetector CT imaging of the cervical and thoracic spine was performed without contrast. Multiplanar CT image reconstructions were also generated. COMPARISON:  CT cervical spine 10/28/2017 FINDINGS: CT CERVICAL SPINE FINDINGS Alignment: Normal Skull base and vertebrae: There is ACDF hardware extending from C3-C7. Discontinuous osteophyte at C2-3 is unchanged. There is a solid anterior fusion mass from C3-C7. no acute fracture. Soft tissues and spinal canal: No  prevertebral fluid or swelling. No visible canal hematoma. Disc levels: No spinal canal stenosis. Facet hypertrophy is worst at left C7-T1 and right C4-C5. Upper chest: Clear Other: None CT THORACIC SPINE FINDINGS Alignment: Normal Vertebrae: No acute fracture. There are large right lateral osteophytes extending from T5-T10. There is posterior spinal fusion hardware beginning at T10 and extending beyond the field of view inferiorly. Paraspinal and other soft tissues: Nodular opacity in the left upper lobe measures 5 mm. Disc levels: No spinal canal stenosis. IMPRESSION: 1. No acute fracture or static subluxation of the cervical or thoracic spine. 2. C3-7 ACDF without adverse features. 3. Incompletely visualized posterior spinal fusion beginning at T10, with no visible abnormality. 4. 5 mm left upper lobe pulmonary nodule. No follow-up recommended. This recommendation follows the consensus statement: Guidelines for Management of Incidental Pulmonary Nodules Detected on CT Images: From the Fleischner Society 2017; Radiology 2017; 284:228-243. Electronically Signed   By: Ulyses Jarred M.D.   On: 02/07/2018 16:36   Ct Thoracic Spine Wo Contrast  Result Date: 02/07/2018 CLINICAL DATA:  Multiple falls. EXAM: CT CERVICAL SPINE WITHOUT CONTRAST CT THORACIC SPINE WITHOUT CONTRAST TECHNIQUE: Multidetector CT imaging of the cervical and thoracic spine was performed without contrast. Multiplanar CT image reconstructions were also generated. COMPARISON:  CT cervical spine 10/28/2017 FINDINGS: CT CERVICAL SPINE FINDINGS Alignment: Normal Skull base and vertebrae: There is ACDF hardware extending from C3-C7. Discontinuous osteophyte at C2-3 is unchanged. There is a solid anterior fusion mass from C3-C7. no acute fracture. Soft tissues and spinal canal: No prevertebral fluid or swelling. No visible canal hematoma. Disc levels: No spinal canal stenosis. Facet hypertrophy is worst at left C7-T1 and right C4-C5. Upper chest: Clear  Other: None CT THORACIC SPINE FINDINGS Alignment: Normal Vertebrae: No acute fracture. There are large right lateral osteophytes extending from T5-T10. There is posterior spinal fusion hardware beginning at T10 and extending beyond the field of view inferiorly. Paraspinal and other soft tissues: Nodular opacity in the left upper lobe measures 5 mm. Disc levels: No spinal canal stenosis. IMPRESSION: 1. No acute fracture or static subluxation of the cervical or thoracic spine. 2.  C3-7 ACDF without adverse features. 3. Incompletely visualized posterior spinal fusion beginning at T10, with no visible abnormality. 4. 5 mm left upper lobe pulmonary nodule. No follow-up recommended. This recommendation follows the consensus statement: Guidelines for Management of Incidental Pulmonary Nodules Detected on CT Images: From the Fleischner Society 2017; Radiology 2017; 284:228-243. Electronically Signed   By: Ulyses Jarred M.D.   On: 02/07/2018 16:36   Dg Hand Complete Left  Result Date: 02/07/2018 CLINICAL DATA:  Fall with bruising to the hand EXAM: LEFT HAND - COMPLETE 3+ VIEW COMPARISON:  None. FINDINGS: No acute displaced fracture or malalignment. Soft tissue protuberance at the head of the first metacarpal. No radiopaque foreign body. IMPRESSION: No acute osseous abnormality Electronically Signed   By: Donavan Foil M.D.   On: 02/07/2018 16:14   Ct Maxillofacial Wo Cm  Result Date: 02/07/2018 CLINICAL DATA:  Head trauma with multiple falls EXAM: CT HEAD WITHOUT CONTRAST CT MAXILLOFACIAL WITHOUT CONTRAST TECHNIQUE: Multidetector CT imaging of the head and maxillofacial structures were performed using the standard protocol without intravenous contrast. Multiplanar CT image reconstructions of the maxillofacial structures were also generated. COMPARISON:  Head CT 10/28/2017 FINDINGS: CT HEAD FINDINGS Brain: There is no mass, hemorrhage or extra-axial collection. The size and configuration of the ventricles and  extra-axial CSF spaces are normal. There is no acute or chronic infarction. There is mild hypoattenuation of the periventricular white matter, most commonly indicating chronic ischemic microangiopathy. Vascular: No hyperdense vessel or unexpected vascular calcification. Skull: Old inferior right craniotomy site.  No acute fracture. CT MAXILLOFACIAL FINDINGS Osseous: --Complex facial fracture types: No LeFort, zygomaticomaxillary complex or nasoorbitoethmoidal fracture. --Simple fracture types: None. --Mandible, hard palate and teeth: No acute abnormality.Degenerative change of the right temporomandibular joint. Orbits: The globes are intact. Normal appearance of the intra- and extraconal fat. Symmetric extraocular muscles. Sinuses: No fluid levels or advanced mucosal thickening. Soft tissues: Normal visualized extracranial soft tissues. IMPRESSION: 1. No acute intracranial abnormality. 2. No acute fracture of the skull or face. 3. Mild chronic small vessel disease. Electronically Signed   By: Ulyses Jarred M.D.   On: 02/07/2018 16:43    Procedures Procedures (including critical care time)  Medications Ordered in ED Medications  acetaminophen (TYLENOL) tablet 650 mg (650 mg Oral Not Given 02/07/18 2009)     Initial Impression / Assessment and Plan / ED Course  I have reviewed the triage vital signs and the nursing notes.  Pertinent labs & imaging results that were available during my care of the patient were reviewed by me and considered in my medical decision making (see chart for details).     72 y.o. female living at nursing home with fall today. No preceding symptoms. Pain as described above. Labs and imaging are reassuring. Will discharge back to facility. Return precautions discussed.  Patient appears safe for discharge.  Patient case discussed and seen with Dr. Roderic Palau who is in agreement with plan.  Final Clinical Impressions(s) / ED Diagnoses   Final diagnoses:  Fall, initial  encounter  Contusion of face, initial encounter  Left hand pain    ED Discharge Orders    None       Lorelle Gibbs 02/07/18 2344    Milton Ferguson, MD 02/08/18 1218

## 2018-02-07 NOTE — ED Notes (Signed)
Pt called twice for triage and vitals. No response.

## 2018-02-14 DIAGNOSIS — F251 Schizoaffective disorder, depressive type: Secondary | ICD-10-CM | POA: Diagnosis not present

## 2018-02-14 DIAGNOSIS — G20 Parkinson's disease: Secondary | ICD-10-CM | POA: Diagnosis not present

## 2018-02-14 DIAGNOSIS — G4700 Insomnia, unspecified: Secondary | ICD-10-CM | POA: Diagnosis not present

## 2018-02-14 DIAGNOSIS — R630 Anorexia: Secondary | ICD-10-CM | POA: Diagnosis not present

## 2018-02-14 DIAGNOSIS — F419 Anxiety disorder, unspecified: Secondary | ICD-10-CM | POA: Diagnosis not present

## 2018-02-15 DIAGNOSIS — K589 Irritable bowel syndrome without diarrhea: Secondary | ICD-10-CM | POA: Diagnosis not present

## 2018-02-15 DIAGNOSIS — G20 Parkinson's disease: Secondary | ICD-10-CM | POA: Diagnosis not present

## 2018-02-15 DIAGNOSIS — I10 Essential (primary) hypertension: Secondary | ICD-10-CM | POA: Diagnosis not present

## 2018-02-15 DIAGNOSIS — M4807 Spinal stenosis, lumbosacral region: Secondary | ICD-10-CM | POA: Diagnosis not present

## 2018-02-15 DIAGNOSIS — D869 Sarcoidosis, unspecified: Secondary | ICD-10-CM | POA: Diagnosis not present

## 2018-02-15 DIAGNOSIS — F251 Schizoaffective disorder, depressive type: Secondary | ICD-10-CM | POA: Diagnosis not present

## 2018-02-16 DIAGNOSIS — G20 Parkinson's disease: Secondary | ICD-10-CM | POA: Diagnosis not present

## 2018-02-16 DIAGNOSIS — I10 Essential (primary) hypertension: Secondary | ICD-10-CM | POA: Diagnosis not present

## 2018-02-16 DIAGNOSIS — F251 Schizoaffective disorder, depressive type: Secondary | ICD-10-CM | POA: Diagnosis not present

## 2018-02-16 DIAGNOSIS — K589 Irritable bowel syndrome without diarrhea: Secondary | ICD-10-CM | POA: Diagnosis not present

## 2018-02-16 DIAGNOSIS — D869 Sarcoidosis, unspecified: Secondary | ICD-10-CM | POA: Diagnosis not present

## 2018-02-16 DIAGNOSIS — M4807 Spinal stenosis, lumbosacral region: Secondary | ICD-10-CM | POA: Diagnosis not present

## 2018-02-19 DIAGNOSIS — G20 Parkinson's disease: Secondary | ICD-10-CM | POA: Diagnosis not present

## 2018-02-19 DIAGNOSIS — F251 Schizoaffective disorder, depressive type: Secondary | ICD-10-CM | POA: Diagnosis not present

## 2018-02-19 DIAGNOSIS — M4807 Spinal stenosis, lumbosacral region: Secondary | ICD-10-CM | POA: Diagnosis not present

## 2018-02-19 DIAGNOSIS — D869 Sarcoidosis, unspecified: Secondary | ICD-10-CM | POA: Diagnosis not present

## 2018-02-19 DIAGNOSIS — I10 Essential (primary) hypertension: Secondary | ICD-10-CM | POA: Diagnosis not present

## 2018-02-19 DIAGNOSIS — K589 Irritable bowel syndrome without diarrhea: Secondary | ICD-10-CM | POA: Diagnosis not present

## 2018-02-21 DIAGNOSIS — K589 Irritable bowel syndrome without diarrhea: Secondary | ICD-10-CM | POA: Diagnosis not present

## 2018-02-21 DIAGNOSIS — I10 Essential (primary) hypertension: Secondary | ICD-10-CM | POA: Diagnosis not present

## 2018-02-21 DIAGNOSIS — F251 Schizoaffective disorder, depressive type: Secondary | ICD-10-CM | POA: Diagnosis not present

## 2018-02-21 DIAGNOSIS — G20 Parkinson's disease: Secondary | ICD-10-CM | POA: Diagnosis not present

## 2018-02-21 DIAGNOSIS — M4807 Spinal stenosis, lumbosacral region: Secondary | ICD-10-CM | POA: Diagnosis not present

## 2018-02-21 DIAGNOSIS — D869 Sarcoidosis, unspecified: Secondary | ICD-10-CM | POA: Diagnosis not present

## 2018-02-22 DIAGNOSIS — I10 Essential (primary) hypertension: Secondary | ICD-10-CM | POA: Diagnosis not present

## 2018-02-22 DIAGNOSIS — F251 Schizoaffective disorder, depressive type: Secondary | ICD-10-CM | POA: Diagnosis not present

## 2018-02-22 DIAGNOSIS — K589 Irritable bowel syndrome without diarrhea: Secondary | ICD-10-CM | POA: Diagnosis not present

## 2018-02-22 DIAGNOSIS — M4807 Spinal stenosis, lumbosacral region: Secondary | ICD-10-CM | POA: Diagnosis not present

## 2018-02-22 DIAGNOSIS — G20 Parkinson's disease: Secondary | ICD-10-CM | POA: Diagnosis not present

## 2018-02-22 DIAGNOSIS — D869 Sarcoidosis, unspecified: Secondary | ICD-10-CM | POA: Diagnosis not present

## 2018-02-24 DIAGNOSIS — M4807 Spinal stenosis, lumbosacral region: Secondary | ICD-10-CM | POA: Diagnosis not present

## 2018-02-24 DIAGNOSIS — F251 Schizoaffective disorder, depressive type: Secondary | ICD-10-CM | POA: Diagnosis not present

## 2018-02-24 DIAGNOSIS — K589 Irritable bowel syndrome without diarrhea: Secondary | ICD-10-CM | POA: Diagnosis not present

## 2018-02-24 DIAGNOSIS — D869 Sarcoidosis, unspecified: Secondary | ICD-10-CM | POA: Diagnosis not present

## 2018-02-24 DIAGNOSIS — I10 Essential (primary) hypertension: Secondary | ICD-10-CM | POA: Diagnosis not present

## 2018-02-24 DIAGNOSIS — G20 Parkinson's disease: Secondary | ICD-10-CM | POA: Diagnosis not present

## 2018-03-01 DIAGNOSIS — F251 Schizoaffective disorder, depressive type: Secondary | ICD-10-CM | POA: Diagnosis not present

## 2018-03-01 DIAGNOSIS — M4807 Spinal stenosis, lumbosacral region: Secondary | ICD-10-CM | POA: Diagnosis not present

## 2018-03-01 DIAGNOSIS — K589 Irritable bowel syndrome without diarrhea: Secondary | ICD-10-CM | POA: Diagnosis not present

## 2018-03-01 DIAGNOSIS — G20 Parkinson's disease: Secondary | ICD-10-CM | POA: Diagnosis not present

## 2018-03-01 DIAGNOSIS — I10 Essential (primary) hypertension: Secondary | ICD-10-CM | POA: Diagnosis not present

## 2018-03-01 DIAGNOSIS — D869 Sarcoidosis, unspecified: Secondary | ICD-10-CM | POA: Diagnosis not present

## 2018-03-02 DIAGNOSIS — G20 Parkinson's disease: Secondary | ICD-10-CM | POA: Diagnosis not present

## 2018-03-02 DIAGNOSIS — K589 Irritable bowel syndrome without diarrhea: Secondary | ICD-10-CM | POA: Diagnosis not present

## 2018-03-02 DIAGNOSIS — F251 Schizoaffective disorder, depressive type: Secondary | ICD-10-CM | POA: Diagnosis not present

## 2018-03-02 DIAGNOSIS — I10 Essential (primary) hypertension: Secondary | ICD-10-CM | POA: Diagnosis not present

## 2018-03-02 DIAGNOSIS — M4807 Spinal stenosis, lumbosacral region: Secondary | ICD-10-CM | POA: Diagnosis not present

## 2018-03-02 DIAGNOSIS — D869 Sarcoidosis, unspecified: Secondary | ICD-10-CM | POA: Diagnosis not present

## 2018-03-05 DIAGNOSIS — D869 Sarcoidosis, unspecified: Secondary | ICD-10-CM | POA: Diagnosis not present

## 2018-03-05 DIAGNOSIS — G20 Parkinson's disease: Secondary | ICD-10-CM | POA: Diagnosis not present

## 2018-03-05 DIAGNOSIS — F251 Schizoaffective disorder, depressive type: Secondary | ICD-10-CM | POA: Diagnosis not present

## 2018-03-05 DIAGNOSIS — K589 Irritable bowel syndrome without diarrhea: Secondary | ICD-10-CM | POA: Diagnosis not present

## 2018-03-05 DIAGNOSIS — M4807 Spinal stenosis, lumbosacral region: Secondary | ICD-10-CM | POA: Diagnosis not present

## 2018-03-05 DIAGNOSIS — I10 Essential (primary) hypertension: Secondary | ICD-10-CM | POA: Diagnosis not present

## 2018-03-07 DIAGNOSIS — M4807 Spinal stenosis, lumbosacral region: Secondary | ICD-10-CM | POA: Diagnosis not present

## 2018-03-07 DIAGNOSIS — G20 Parkinson's disease: Secondary | ICD-10-CM | POA: Diagnosis not present

## 2018-03-07 DIAGNOSIS — K589 Irritable bowel syndrome without diarrhea: Secondary | ICD-10-CM | POA: Diagnosis not present

## 2018-03-07 DIAGNOSIS — F251 Schizoaffective disorder, depressive type: Secondary | ICD-10-CM | POA: Diagnosis not present

## 2018-03-07 DIAGNOSIS — D869 Sarcoidosis, unspecified: Secondary | ICD-10-CM | POA: Diagnosis not present

## 2018-03-07 DIAGNOSIS — I10 Essential (primary) hypertension: Secondary | ICD-10-CM | POA: Diagnosis not present

## 2018-03-08 DIAGNOSIS — G20 Parkinson's disease: Secondary | ICD-10-CM | POA: Diagnosis not present

## 2018-03-08 DIAGNOSIS — I10 Essential (primary) hypertension: Secondary | ICD-10-CM | POA: Diagnosis not present

## 2018-03-08 DIAGNOSIS — F251 Schizoaffective disorder, depressive type: Secondary | ICD-10-CM | POA: Diagnosis not present

## 2018-03-08 DIAGNOSIS — D869 Sarcoidosis, unspecified: Secondary | ICD-10-CM | POA: Diagnosis not present

## 2018-03-08 DIAGNOSIS — M4807 Spinal stenosis, lumbosacral region: Secondary | ICD-10-CM | POA: Diagnosis not present

## 2018-03-08 DIAGNOSIS — K589 Irritable bowel syndrome without diarrhea: Secondary | ICD-10-CM | POA: Diagnosis not present

## 2018-03-12 DIAGNOSIS — G9340 Encephalopathy, unspecified: Secondary | ICD-10-CM | POA: Diagnosis not present

## 2018-03-12 DIAGNOSIS — M79673 Pain in unspecified foot: Secondary | ICD-10-CM | POA: Diagnosis not present

## 2018-03-12 DIAGNOSIS — E785 Hyperlipidemia, unspecified: Secondary | ICD-10-CM | POA: Diagnosis not present

## 2018-03-12 DIAGNOSIS — E119 Type 2 diabetes mellitus without complications: Secondary | ICD-10-CM | POA: Diagnosis not present

## 2018-03-12 DIAGNOSIS — E876 Hypokalemia: Secondary | ICD-10-CM | POA: Diagnosis not present

## 2018-03-12 DIAGNOSIS — M549 Dorsalgia, unspecified: Secondary | ICD-10-CM | POA: Diagnosis not present

## 2018-03-12 DIAGNOSIS — R2681 Unsteadiness on feet: Secondary | ICD-10-CM | POA: Diagnosis not present

## 2018-03-12 DIAGNOSIS — D649 Anemia, unspecified: Secondary | ICD-10-CM | POA: Diagnosis not present

## 2018-03-12 DIAGNOSIS — E039 Hypothyroidism, unspecified: Secondary | ICD-10-CM | POA: Diagnosis not present

## 2018-03-12 DIAGNOSIS — L603 Nail dystrophy: Secondary | ICD-10-CM | POA: Diagnosis not present

## 2018-03-12 DIAGNOSIS — Z79899 Other long term (current) drug therapy: Secondary | ICD-10-CM | POA: Diagnosis not present

## 2018-03-12 DIAGNOSIS — E559 Vitamin D deficiency, unspecified: Secondary | ICD-10-CM | POA: Diagnosis not present

## 2018-03-12 DIAGNOSIS — I10 Essential (primary) hypertension: Secondary | ICD-10-CM | POA: Diagnosis not present

## 2018-03-14 DIAGNOSIS — D869 Sarcoidosis, unspecified: Secondary | ICD-10-CM | POA: Diagnosis not present

## 2018-03-14 DIAGNOSIS — F251 Schizoaffective disorder, depressive type: Secondary | ICD-10-CM | POA: Diagnosis not present

## 2018-03-14 DIAGNOSIS — G20 Parkinson's disease: Secondary | ICD-10-CM | POA: Diagnosis not present

## 2018-03-14 DIAGNOSIS — I10 Essential (primary) hypertension: Secondary | ICD-10-CM | POA: Diagnosis not present

## 2018-03-14 DIAGNOSIS — M4807 Spinal stenosis, lumbosacral region: Secondary | ICD-10-CM | POA: Diagnosis not present

## 2018-03-14 DIAGNOSIS — K589 Irritable bowel syndrome without diarrhea: Secondary | ICD-10-CM | POA: Diagnosis not present

## 2018-03-16 DIAGNOSIS — K589 Irritable bowel syndrome without diarrhea: Secondary | ICD-10-CM | POA: Diagnosis not present

## 2018-03-16 DIAGNOSIS — F251 Schizoaffective disorder, depressive type: Secondary | ICD-10-CM | POA: Diagnosis not present

## 2018-03-16 DIAGNOSIS — D869 Sarcoidosis, unspecified: Secondary | ICD-10-CM | POA: Diagnosis not present

## 2018-03-16 DIAGNOSIS — G20 Parkinson's disease: Secondary | ICD-10-CM | POA: Diagnosis not present

## 2018-03-16 DIAGNOSIS — I10 Essential (primary) hypertension: Secondary | ICD-10-CM | POA: Diagnosis not present

## 2018-03-16 DIAGNOSIS — M4807 Spinal stenosis, lumbosacral region: Secondary | ICD-10-CM | POA: Diagnosis not present

## 2018-03-17 DIAGNOSIS — G20 Parkinson's disease: Secondary | ICD-10-CM | POA: Diagnosis not present

## 2018-03-17 DIAGNOSIS — D869 Sarcoidosis, unspecified: Secondary | ICD-10-CM | POA: Diagnosis not present

## 2018-03-17 DIAGNOSIS — M4807 Spinal stenosis, lumbosacral region: Secondary | ICD-10-CM | POA: Diagnosis not present

## 2018-03-17 DIAGNOSIS — F251 Schizoaffective disorder, depressive type: Secondary | ICD-10-CM | POA: Diagnosis not present

## 2018-03-17 DIAGNOSIS — K589 Irritable bowel syndrome without diarrhea: Secondary | ICD-10-CM | POA: Diagnosis not present

## 2018-03-17 DIAGNOSIS — I10 Essential (primary) hypertension: Secondary | ICD-10-CM | POA: Diagnosis not present

## 2018-03-20 DIAGNOSIS — F251 Schizoaffective disorder, depressive type: Secondary | ICD-10-CM | POA: Diagnosis not present

## 2018-03-20 DIAGNOSIS — M4807 Spinal stenosis, lumbosacral region: Secondary | ICD-10-CM | POA: Diagnosis not present

## 2018-03-20 DIAGNOSIS — G20 Parkinson's disease: Secondary | ICD-10-CM | POA: Diagnosis not present

## 2018-03-20 DIAGNOSIS — I10 Essential (primary) hypertension: Secondary | ICD-10-CM | POA: Diagnosis not present

## 2018-03-20 DIAGNOSIS — K589 Irritable bowel syndrome without diarrhea: Secondary | ICD-10-CM | POA: Diagnosis not present

## 2018-03-20 DIAGNOSIS — D869 Sarcoidosis, unspecified: Secondary | ICD-10-CM | POA: Diagnosis not present

## 2018-03-21 DIAGNOSIS — K589 Irritable bowel syndrome without diarrhea: Secondary | ICD-10-CM | POA: Diagnosis not present

## 2018-03-21 DIAGNOSIS — D869 Sarcoidosis, unspecified: Secondary | ICD-10-CM | POA: Diagnosis not present

## 2018-03-21 DIAGNOSIS — G20 Parkinson's disease: Secondary | ICD-10-CM | POA: Diagnosis not present

## 2018-03-21 DIAGNOSIS — I10 Essential (primary) hypertension: Secondary | ICD-10-CM | POA: Diagnosis not present

## 2018-03-21 DIAGNOSIS — M4807 Spinal stenosis, lumbosacral region: Secondary | ICD-10-CM | POA: Diagnosis not present

## 2018-03-21 DIAGNOSIS — F251 Schizoaffective disorder, depressive type: Secondary | ICD-10-CM | POA: Diagnosis not present

## 2018-03-22 DIAGNOSIS — K589 Irritable bowel syndrome without diarrhea: Secondary | ICD-10-CM | POA: Diagnosis not present

## 2018-03-22 DIAGNOSIS — G20 Parkinson's disease: Secondary | ICD-10-CM | POA: Diagnosis not present

## 2018-03-22 DIAGNOSIS — M4807 Spinal stenosis, lumbosacral region: Secondary | ICD-10-CM | POA: Diagnosis not present

## 2018-03-22 DIAGNOSIS — D869 Sarcoidosis, unspecified: Secondary | ICD-10-CM | POA: Diagnosis not present

## 2018-03-22 DIAGNOSIS — I10 Essential (primary) hypertension: Secondary | ICD-10-CM | POA: Diagnosis not present

## 2018-03-22 DIAGNOSIS — F251 Schizoaffective disorder, depressive type: Secondary | ICD-10-CM | POA: Diagnosis not present

## 2018-04-11 DIAGNOSIS — F419 Anxiety disorder, unspecified: Secondary | ICD-10-CM | POA: Diagnosis not present

## 2018-04-11 DIAGNOSIS — F251 Schizoaffective disorder, depressive type: Secondary | ICD-10-CM | POA: Diagnosis not present

## 2018-05-09 DIAGNOSIS — F251 Schizoaffective disorder, depressive type: Secondary | ICD-10-CM | POA: Diagnosis not present

## 2018-05-09 DIAGNOSIS — F419 Anxiety disorder, unspecified: Secondary | ICD-10-CM | POA: Diagnosis not present

## 2018-05-14 DIAGNOSIS — R269 Unspecified abnormalities of gait and mobility: Secondary | ICD-10-CM | POA: Diagnosis not present

## 2018-05-14 DIAGNOSIS — G9340 Encephalopathy, unspecified: Secondary | ICD-10-CM | POA: Diagnosis not present

## 2018-05-14 DIAGNOSIS — M79673 Pain in unspecified foot: Secondary | ICD-10-CM | POA: Diagnosis not present

## 2018-05-14 DIAGNOSIS — L609 Nail disorder, unspecified: Secondary | ICD-10-CM | POA: Diagnosis not present

## 2018-05-16 IMAGING — MR MR HEAD WO/W CM
10 of 12 series · 34 of 48 positions shown · IV contrast (multihance)
Comparison: Head CT 11/28/2016.

CLINICAL DATA: History of neuro sarcoid. Chronic lymphocytic
leukemia. Hypertension. Confusion and altered mental status
recently. Increasing somnolence. Poorly responsive.

EXAM:
MRI HEAD WITHOUT AND WITH CONTRAST
TECHNIQUE: Multiplanar, multiecho pulse sequences of the brain and surrounding
structures were obtained without and with intravenous contrast.
CONTRAST:  15mL MULTIHANCE GADOBENATE DIMEGLUMINE 529 MG/ML IV SOLN

[Series 3: T1 · sagittal · 5.0mm · 0.47mm/px · 3 of 23 slices shown]
[im 1/23]
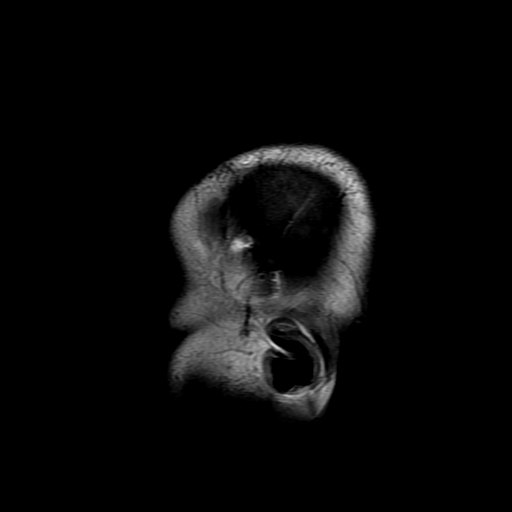
[im 12/23]
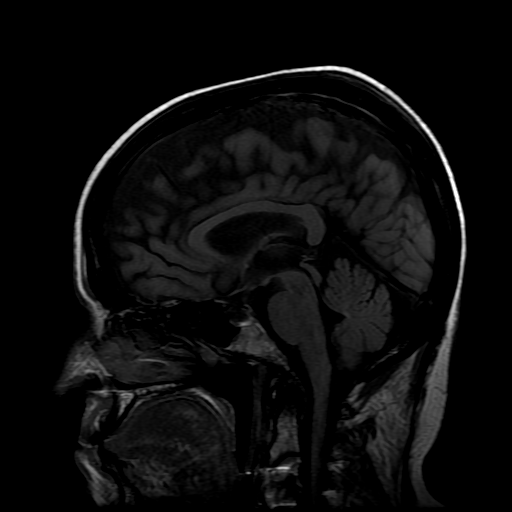
[im 23/23]
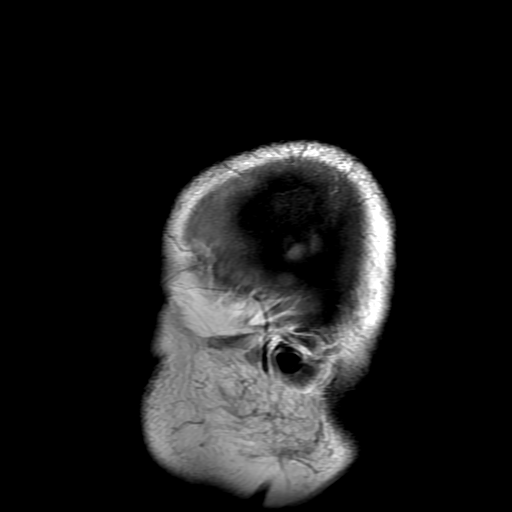

[Series 4: DWI · axial · 3.0mm · 1.09mm/px · z∈[-94,+46]mm · 9 of 96 slices shown (1 of 4)]
[im 1/96]
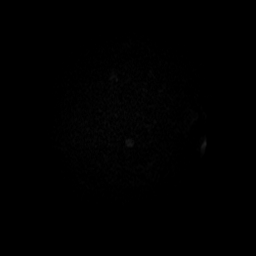
[im 12/96]
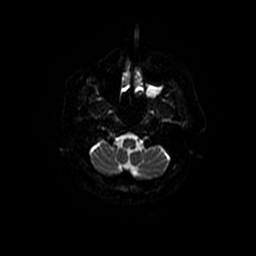
[im 24/96]
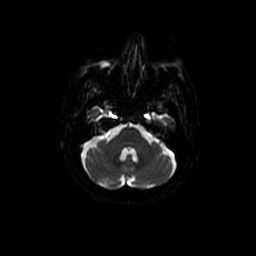
[im 36/96]
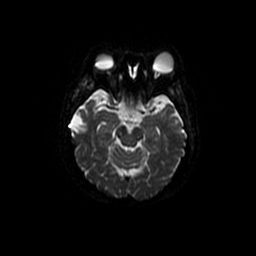
[im 48/96]
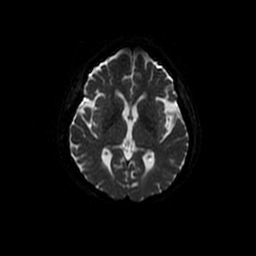
[im 60/96]
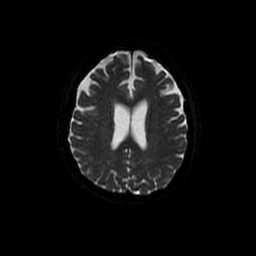
[im 72/96]
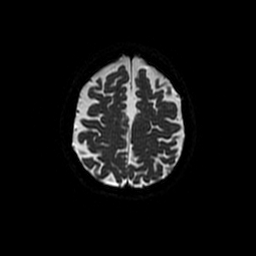
[im 84/96]
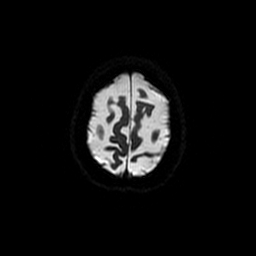
[im 96/96]
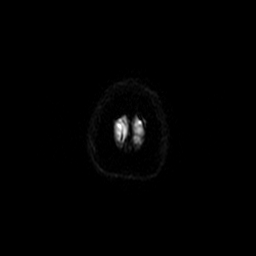

[Series 5: T2 · axial · 5.0mm · 0.43mm/px · z∈[-96,+47]mm · 2 of 25 slices shown]
[im 1/25]
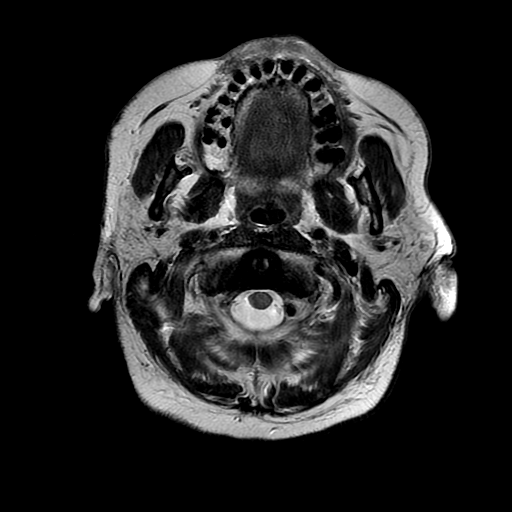
[im 25/25]
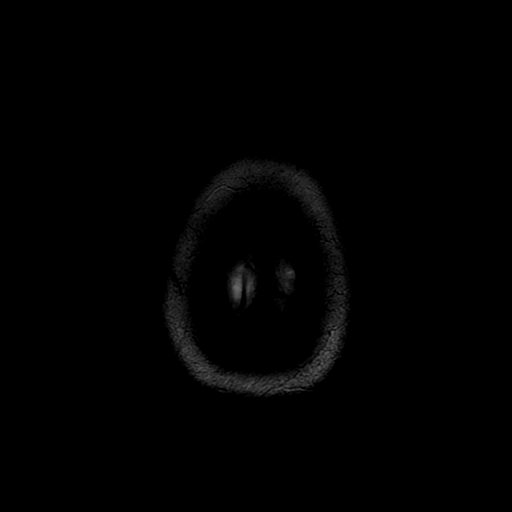

[Series 7: ax mpgr · axial · 5.0mm · 0.43mm/px · 1 of 25 slices shown]
[im 1/25]
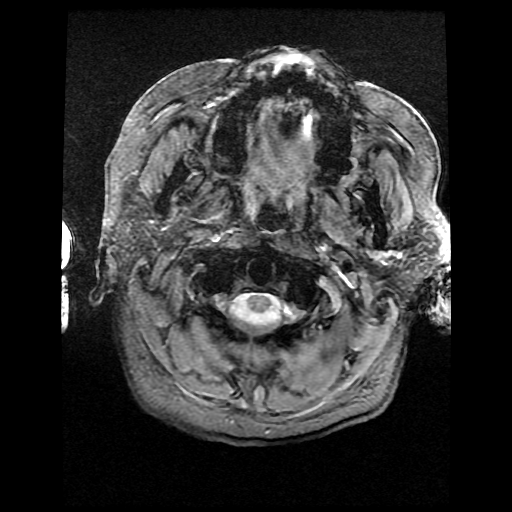

[Series 8: FLAIR · axial · 5.0mm · 0.43mm/px · z∈[-96,+47]mm · 2 of 25 slices shown]
[im 1/25]
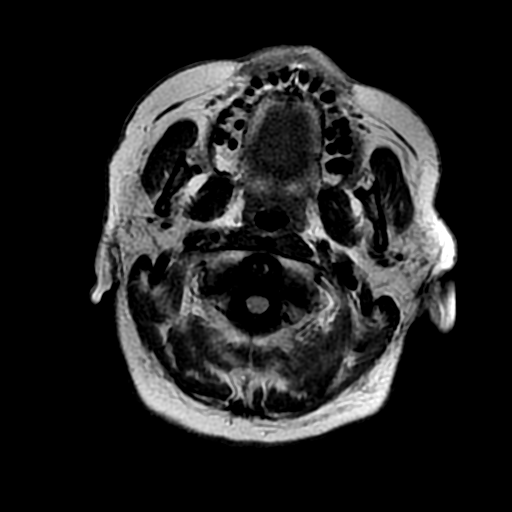
[im 25/25]
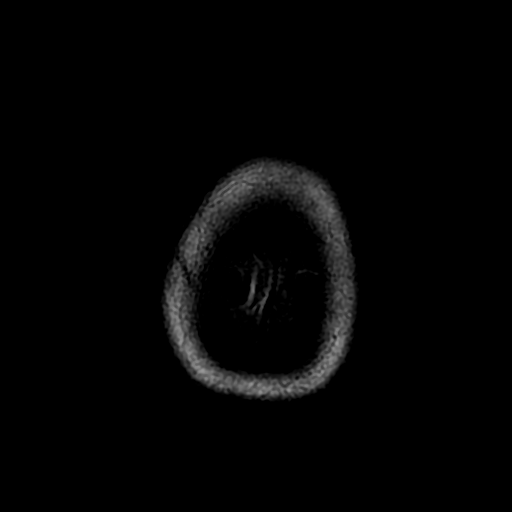

[Series 10: DWI · coronal · 5.0mm · 1.09mm/px · 6 of 64 slices shown (2 of 4)]
[im 1/64]
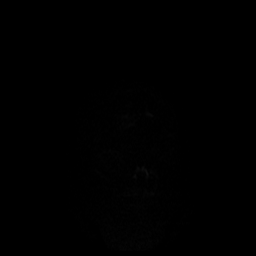
[im 13/64]
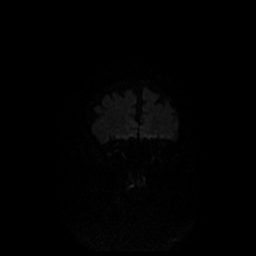
[im 26/64]
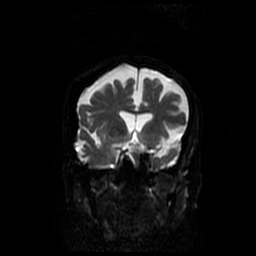
[im 38/64]
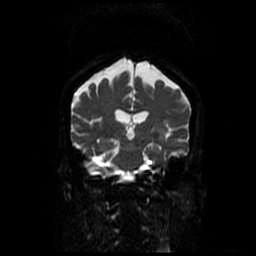
[im 51/64]
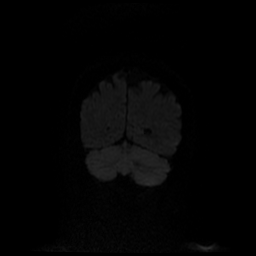
[im 64/64]
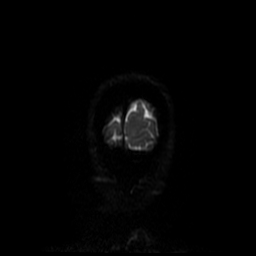

[Series 12: T1 post-contrast · coronal · 5.0mm · 0.43mm/px · 2 of 25 slices shown]
[im 1/25]
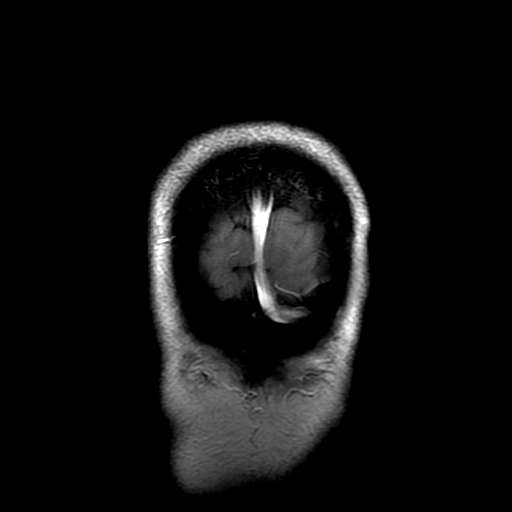
[im 25/25]
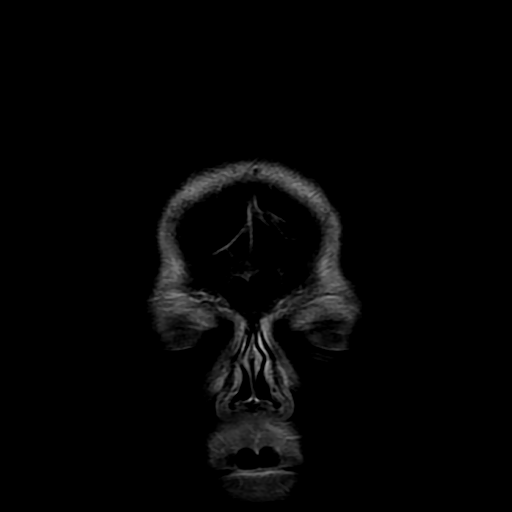

[Series 13: T2 post-contrast · coronal · 5.0mm · 0.43mm/px · 2 of 25 slices shown]
[im 1/25]
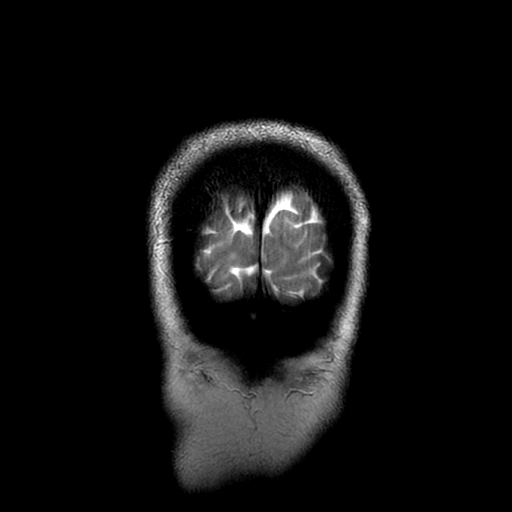
[im 25/25]
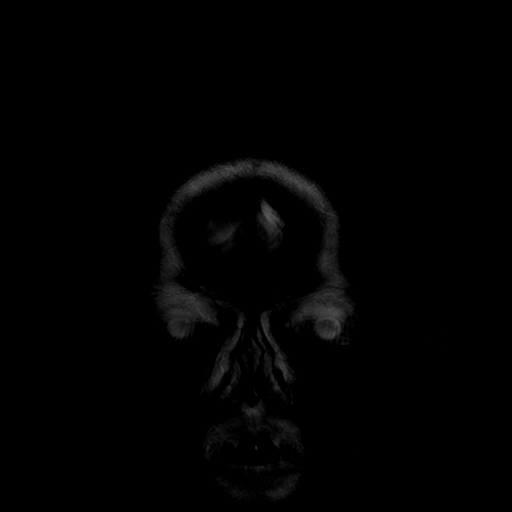

[Series 400: DWI · axial · 3.0mm · 1.09mm/px · z∈[-94,+46]mm · 4 of 48 slices shown (3 of 4)]
[im 1/48]
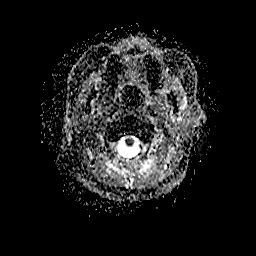
[im 16/48]
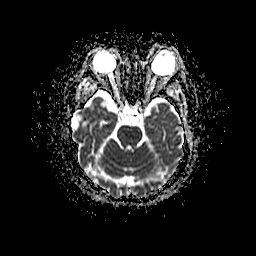
[im 32/48]
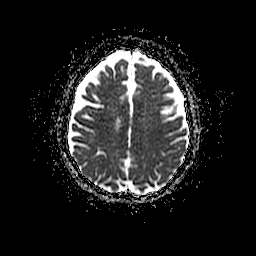
[im 48/48]
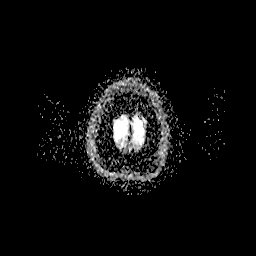

[Series 1000: DWI · coronal · 5.0mm · 1.09mm/px · 3 of 32 slices shown (4 of 4)]
[im 1/32]
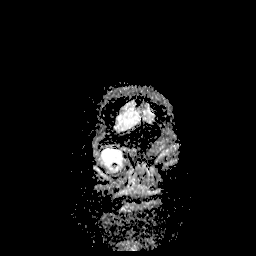
[im 16/32]
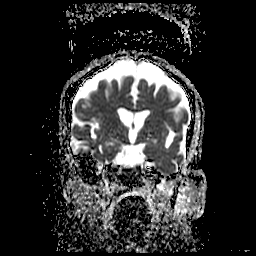
[im 32/32]
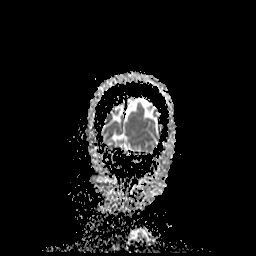

[34 of 48 positions shown; findings below may reference images not displayed]

FINDINGS: Brain: Diffusion imaging does not show any acute or subacute
infarction. The brainstem is normal. The cerebellum is normal.
Cerebral hemispheres show atrophy with mild chronic small-vessel
ischemic changes of the deep and subcortical white matter an old
lacunar infarction in the right caudate. No cortical or large vessel
territory infarction. There is been previous right temporal
craniotomy. There is a focus of encephalomalacia with adjacent
gliosis of the lateral temporal lobe on the right. This could relate
to previous brain biopsy or could possibly relate to previous head
trauma. No mass lesion, hemorrhage, hydrocephalus or extra-axial
collection. No abnormal leptomeningeal enhancement. No finding to
allow diagnosis of neurosarcoidosis on this examination. Small
developmental venous anomaly at the base of the brain on the left.

Vascular: Major vessels at the base of the brain show flow.

Skull and upper cervical spine: Right temporal craniotomy. Otherwise
negative

Sinuses/Orbits: Retention cyst left maxillary sinus. Sinuses
otherwise clear. Orbits negative.

Other: None significant
IMPRESSION: No acute finding by brain MRI. Atrophy and chronic small-vessel
ischemic change as outlined above. Old right temporal craniotomy
with lateral right temporal defect which could be postsurgical or
related to old head trauma.

No imaging finding to allow diagnosis of neuro sarcoid at this time.

## 2018-07-16 DIAGNOSIS — M79673 Pain in unspecified foot: Secondary | ICD-10-CM | POA: Diagnosis not present

## 2018-07-16 DIAGNOSIS — L603 Nail dystrophy: Secondary | ICD-10-CM | POA: Diagnosis not present

## 2018-07-16 DIAGNOSIS — G9340 Encephalopathy, unspecified: Secondary | ICD-10-CM | POA: Diagnosis not present

## 2018-07-16 DIAGNOSIS — R2689 Other abnormalities of gait and mobility: Secondary | ICD-10-CM | POA: Diagnosis not present

## 2018-07-25 DIAGNOSIS — F0390 Unspecified dementia without behavioral disturbance: Secondary | ICD-10-CM | POA: Diagnosis not present

## 2018-07-26 DIAGNOSIS — D8689 Sarcoidosis of other sites: Secondary | ICD-10-CM | POA: Diagnosis not present

## 2018-07-26 DIAGNOSIS — M6281 Muscle weakness (generalized): Secondary | ICD-10-CM | POA: Diagnosis not present

## 2018-07-26 DIAGNOSIS — R2689 Other abnormalities of gait and mobility: Secondary | ICD-10-CM | POA: Diagnosis not present

## 2018-07-26 DIAGNOSIS — N390 Urinary tract infection, site not specified: Secondary | ICD-10-CM | POA: Diagnosis not present

## 2018-07-26 DIAGNOSIS — E876 Hypokalemia: Secondary | ICD-10-CM | POA: Diagnosis not present

## 2018-07-26 DIAGNOSIS — D509 Iron deficiency anemia, unspecified: Secondary | ICD-10-CM | POA: Diagnosis not present

## 2018-07-26 DIAGNOSIS — E039 Hypothyroidism, unspecified: Secondary | ICD-10-CM | POA: Diagnosis not present

## 2018-07-26 DIAGNOSIS — Z79899 Other long term (current) drug therapy: Secondary | ICD-10-CM | POA: Diagnosis not present

## 2018-07-26 DIAGNOSIS — R41841 Cognitive communication deficit: Secondary | ICD-10-CM | POA: Diagnosis not present

## 2018-07-26 DIAGNOSIS — E119 Type 2 diabetes mellitus without complications: Secondary | ICD-10-CM | POA: Diagnosis not present

## 2018-07-26 DIAGNOSIS — G219 Secondary parkinsonism, unspecified: Secondary | ICD-10-CM | POA: Diagnosis not present

## 2018-07-26 DIAGNOSIS — F251 Schizoaffective disorder, depressive type: Secondary | ICD-10-CM | POA: Diagnosis not present

## 2018-07-26 DIAGNOSIS — M549 Dorsalgia, unspecified: Secondary | ICD-10-CM | POA: Diagnosis not present

## 2018-07-26 DIAGNOSIS — Z856 Personal history of leukemia: Secondary | ICD-10-CM | POA: Diagnosis not present

## 2018-07-26 DIAGNOSIS — D649 Anemia, unspecified: Secondary | ICD-10-CM | POA: Diagnosis not present

## 2018-07-26 DIAGNOSIS — I10 Essential (primary) hypertension: Secondary | ICD-10-CM | POA: Diagnosis not present

## 2018-07-26 DIAGNOSIS — F0390 Unspecified dementia without behavioral disturbance: Secondary | ICD-10-CM | POA: Diagnosis not present

## 2018-07-26 DIAGNOSIS — R319 Hematuria, unspecified: Secondary | ICD-10-CM | POA: Diagnosis not present

## 2018-07-26 DIAGNOSIS — K589 Irritable bowel syndrome without diarrhea: Secondary | ICD-10-CM | POA: Diagnosis not present

## 2018-07-31 DIAGNOSIS — F0390 Unspecified dementia without behavioral disturbance: Secondary | ICD-10-CM | POA: Diagnosis not present

## 2018-07-31 DIAGNOSIS — D509 Iron deficiency anemia, unspecified: Secondary | ICD-10-CM | POA: Diagnosis not present

## 2018-07-31 DIAGNOSIS — R41841 Cognitive communication deficit: Secondary | ICD-10-CM | POA: Diagnosis not present

## 2018-07-31 DIAGNOSIS — F251 Schizoaffective disorder, depressive type: Secondary | ICD-10-CM | POA: Diagnosis not present

## 2018-07-31 DIAGNOSIS — D8689 Sarcoidosis of other sites: Secondary | ICD-10-CM | POA: Diagnosis not present

## 2018-07-31 DIAGNOSIS — G219 Secondary parkinsonism, unspecified: Secondary | ICD-10-CM | POA: Diagnosis not present

## 2018-08-01 DIAGNOSIS — F251 Schizoaffective disorder, depressive type: Secondary | ICD-10-CM | POA: Diagnosis not present

## 2018-08-01 DIAGNOSIS — R41841 Cognitive communication deficit: Secondary | ICD-10-CM | POA: Diagnosis not present

## 2018-08-01 DIAGNOSIS — D8689 Sarcoidosis of other sites: Secondary | ICD-10-CM | POA: Diagnosis not present

## 2018-08-01 DIAGNOSIS — F0390 Unspecified dementia without behavioral disturbance: Secondary | ICD-10-CM | POA: Diagnosis not present

## 2018-08-01 DIAGNOSIS — G219 Secondary parkinsonism, unspecified: Secondary | ICD-10-CM | POA: Diagnosis not present

## 2018-08-01 DIAGNOSIS — D509 Iron deficiency anemia, unspecified: Secondary | ICD-10-CM | POA: Diagnosis not present

## 2018-08-03 DIAGNOSIS — F251 Schizoaffective disorder, depressive type: Secondary | ICD-10-CM | POA: Diagnosis not present

## 2018-08-03 DIAGNOSIS — F419 Anxiety disorder, unspecified: Secondary | ICD-10-CM | POA: Diagnosis not present

## 2018-08-04 DIAGNOSIS — G219 Secondary parkinsonism, unspecified: Secondary | ICD-10-CM | POA: Diagnosis not present

## 2018-08-04 DIAGNOSIS — R41841 Cognitive communication deficit: Secondary | ICD-10-CM | POA: Diagnosis not present

## 2018-08-04 DIAGNOSIS — D8689 Sarcoidosis of other sites: Secondary | ICD-10-CM | POA: Diagnosis not present

## 2018-08-04 DIAGNOSIS — F251 Schizoaffective disorder, depressive type: Secondary | ICD-10-CM | POA: Diagnosis not present

## 2018-08-04 DIAGNOSIS — D509 Iron deficiency anemia, unspecified: Secondary | ICD-10-CM | POA: Diagnosis not present

## 2018-08-04 DIAGNOSIS — F0390 Unspecified dementia without behavioral disturbance: Secondary | ICD-10-CM | POA: Diagnosis not present

## 2018-08-06 DIAGNOSIS — G219 Secondary parkinsonism, unspecified: Secondary | ICD-10-CM | POA: Diagnosis not present

## 2018-08-06 DIAGNOSIS — D8689 Sarcoidosis of other sites: Secondary | ICD-10-CM | POA: Diagnosis not present

## 2018-08-06 DIAGNOSIS — F0390 Unspecified dementia without behavioral disturbance: Secondary | ICD-10-CM | POA: Diagnosis not present

## 2018-08-06 DIAGNOSIS — F251 Schizoaffective disorder, depressive type: Secondary | ICD-10-CM | POA: Diagnosis not present

## 2018-08-06 DIAGNOSIS — D509 Iron deficiency anemia, unspecified: Secondary | ICD-10-CM | POA: Diagnosis not present

## 2018-08-06 DIAGNOSIS — R41841 Cognitive communication deficit: Secondary | ICD-10-CM | POA: Diagnosis not present

## 2018-08-08 DIAGNOSIS — D8689 Sarcoidosis of other sites: Secondary | ICD-10-CM | POA: Diagnosis not present

## 2018-08-08 DIAGNOSIS — D509 Iron deficiency anemia, unspecified: Secondary | ICD-10-CM | POA: Diagnosis not present

## 2018-08-08 DIAGNOSIS — F0390 Unspecified dementia without behavioral disturbance: Secondary | ICD-10-CM | POA: Diagnosis not present

## 2018-08-08 DIAGNOSIS — F251 Schizoaffective disorder, depressive type: Secondary | ICD-10-CM | POA: Diagnosis not present

## 2018-08-08 DIAGNOSIS — R41841 Cognitive communication deficit: Secondary | ICD-10-CM | POA: Diagnosis not present

## 2018-08-08 DIAGNOSIS — G219 Secondary parkinsonism, unspecified: Secondary | ICD-10-CM | POA: Diagnosis not present

## 2018-08-10 DIAGNOSIS — D649 Anemia, unspecified: Secondary | ICD-10-CM | POA: Diagnosis not present

## 2018-08-10 DIAGNOSIS — K921 Melena: Secondary | ICD-10-CM | POA: Diagnosis not present

## 2018-08-10 DIAGNOSIS — R5383 Other fatigue: Secondary | ICD-10-CM | POA: Diagnosis not present

## 2018-08-11 DIAGNOSIS — D509 Iron deficiency anemia, unspecified: Secondary | ICD-10-CM | POA: Diagnosis not present

## 2018-08-11 DIAGNOSIS — G219 Secondary parkinsonism, unspecified: Secondary | ICD-10-CM | POA: Diagnosis not present

## 2018-08-11 DIAGNOSIS — F0390 Unspecified dementia without behavioral disturbance: Secondary | ICD-10-CM | POA: Diagnosis not present

## 2018-08-11 DIAGNOSIS — D8689 Sarcoidosis of other sites: Secondary | ICD-10-CM | POA: Diagnosis not present

## 2018-08-11 DIAGNOSIS — R41841 Cognitive communication deficit: Secondary | ICD-10-CM | POA: Diagnosis not present

## 2018-08-11 DIAGNOSIS — F251 Schizoaffective disorder, depressive type: Secondary | ICD-10-CM | POA: Diagnosis not present

## 2018-08-13 DIAGNOSIS — F251 Schizoaffective disorder, depressive type: Secondary | ICD-10-CM | POA: Diagnosis not present

## 2018-08-13 DIAGNOSIS — F0390 Unspecified dementia without behavioral disturbance: Secondary | ICD-10-CM | POA: Diagnosis not present

## 2018-08-13 DIAGNOSIS — G219 Secondary parkinsonism, unspecified: Secondary | ICD-10-CM | POA: Diagnosis not present

## 2018-08-13 DIAGNOSIS — D8689 Sarcoidosis of other sites: Secondary | ICD-10-CM | POA: Diagnosis not present

## 2018-08-13 DIAGNOSIS — R41841 Cognitive communication deficit: Secondary | ICD-10-CM | POA: Diagnosis not present

## 2018-08-13 DIAGNOSIS — D509 Iron deficiency anemia, unspecified: Secondary | ICD-10-CM | POA: Diagnosis not present

## 2018-08-15 DIAGNOSIS — F0390 Unspecified dementia without behavioral disturbance: Secondary | ICD-10-CM | POA: Diagnosis not present

## 2018-08-15 DIAGNOSIS — F251 Schizoaffective disorder, depressive type: Secondary | ICD-10-CM | POA: Diagnosis not present

## 2018-08-15 DIAGNOSIS — D509 Iron deficiency anemia, unspecified: Secondary | ICD-10-CM | POA: Diagnosis not present

## 2018-08-15 DIAGNOSIS — D8689 Sarcoidosis of other sites: Secondary | ICD-10-CM | POA: Diagnosis not present

## 2018-08-15 DIAGNOSIS — R41841 Cognitive communication deficit: Secondary | ICD-10-CM | POA: Diagnosis not present

## 2018-08-15 DIAGNOSIS — G219 Secondary parkinsonism, unspecified: Secondary | ICD-10-CM | POA: Diagnosis not present

## 2018-08-16 DIAGNOSIS — I10 Essential (primary) hypertension: Secondary | ICD-10-CM | POA: Diagnosis not present

## 2018-08-16 DIAGNOSIS — E119 Type 2 diabetes mellitus without complications: Secondary | ICD-10-CM | POA: Diagnosis not present

## 2018-08-16 DIAGNOSIS — E876 Hypokalemia: Secondary | ICD-10-CM | POA: Diagnosis not present

## 2018-08-21 DIAGNOSIS — G219 Secondary parkinsonism, unspecified: Secondary | ICD-10-CM | POA: Diagnosis not present

## 2018-08-21 DIAGNOSIS — R41841 Cognitive communication deficit: Secondary | ICD-10-CM | POA: Diagnosis not present

## 2018-08-21 DIAGNOSIS — D8689 Sarcoidosis of other sites: Secondary | ICD-10-CM | POA: Diagnosis not present

## 2018-08-21 DIAGNOSIS — F251 Schizoaffective disorder, depressive type: Secondary | ICD-10-CM | POA: Diagnosis not present

## 2018-08-21 DIAGNOSIS — D509 Iron deficiency anemia, unspecified: Secondary | ICD-10-CM | POA: Diagnosis not present

## 2018-08-21 DIAGNOSIS — F0390 Unspecified dementia without behavioral disturbance: Secondary | ICD-10-CM | POA: Diagnosis not present

## 2018-08-22 DIAGNOSIS — G219 Secondary parkinsonism, unspecified: Secondary | ICD-10-CM | POA: Diagnosis not present

## 2018-08-22 DIAGNOSIS — R41841 Cognitive communication deficit: Secondary | ICD-10-CM | POA: Diagnosis not present

## 2018-08-22 DIAGNOSIS — D509 Iron deficiency anemia, unspecified: Secondary | ICD-10-CM | POA: Diagnosis not present

## 2018-08-22 DIAGNOSIS — F0390 Unspecified dementia without behavioral disturbance: Secondary | ICD-10-CM | POA: Diagnosis not present

## 2018-08-22 DIAGNOSIS — D8689 Sarcoidosis of other sites: Secondary | ICD-10-CM | POA: Diagnosis not present

## 2018-08-22 DIAGNOSIS — F251 Schizoaffective disorder, depressive type: Secondary | ICD-10-CM | POA: Diagnosis not present

## 2018-08-23 DIAGNOSIS — D509 Iron deficiency anemia, unspecified: Secondary | ICD-10-CM | POA: Diagnosis not present

## 2018-08-23 DIAGNOSIS — D8689 Sarcoidosis of other sites: Secondary | ICD-10-CM | POA: Diagnosis not present

## 2018-08-23 DIAGNOSIS — F251 Schizoaffective disorder, depressive type: Secondary | ICD-10-CM | POA: Diagnosis not present

## 2018-08-23 DIAGNOSIS — F0390 Unspecified dementia without behavioral disturbance: Secondary | ICD-10-CM | POA: Diagnosis not present

## 2018-08-23 DIAGNOSIS — R41841 Cognitive communication deficit: Secondary | ICD-10-CM | POA: Diagnosis not present

## 2018-08-23 DIAGNOSIS — G219 Secondary parkinsonism, unspecified: Secondary | ICD-10-CM | POA: Diagnosis not present

## 2018-08-24 DIAGNOSIS — F0390 Unspecified dementia without behavioral disturbance: Secondary | ICD-10-CM | POA: Diagnosis not present

## 2018-08-24 DIAGNOSIS — D509 Iron deficiency anemia, unspecified: Secondary | ICD-10-CM | POA: Diagnosis not present

## 2018-08-24 DIAGNOSIS — G219 Secondary parkinsonism, unspecified: Secondary | ICD-10-CM | POA: Diagnosis not present

## 2018-08-24 DIAGNOSIS — D8689 Sarcoidosis of other sites: Secondary | ICD-10-CM | POA: Diagnosis not present

## 2018-08-24 DIAGNOSIS — R41841 Cognitive communication deficit: Secondary | ICD-10-CM | POA: Diagnosis not present

## 2018-08-24 DIAGNOSIS — F251 Schizoaffective disorder, depressive type: Secondary | ICD-10-CM | POA: Diagnosis not present

## 2018-08-25 DIAGNOSIS — Z856 Personal history of leukemia: Secondary | ICD-10-CM | POA: Diagnosis not present

## 2018-08-25 DIAGNOSIS — I10 Essential (primary) hypertension: Secondary | ICD-10-CM | POA: Diagnosis not present

## 2018-08-25 DIAGNOSIS — G219 Secondary parkinsonism, unspecified: Secondary | ICD-10-CM | POA: Diagnosis not present

## 2018-08-25 DIAGNOSIS — F251 Schizoaffective disorder, depressive type: Secondary | ICD-10-CM | POA: Diagnosis not present

## 2018-08-25 DIAGNOSIS — F0390 Unspecified dementia without behavioral disturbance: Secondary | ICD-10-CM | POA: Diagnosis not present

## 2018-08-25 DIAGNOSIS — D8689 Sarcoidosis of other sites: Secondary | ICD-10-CM | POA: Diagnosis not present

## 2018-08-25 DIAGNOSIS — D509 Iron deficiency anemia, unspecified: Secondary | ICD-10-CM | POA: Diagnosis not present

## 2018-08-25 DIAGNOSIS — K589 Irritable bowel syndrome without diarrhea: Secondary | ICD-10-CM | POA: Diagnosis not present

## 2018-08-25 DIAGNOSIS — R2689 Other abnormalities of gait and mobility: Secondary | ICD-10-CM | POA: Diagnosis not present

## 2018-08-25 DIAGNOSIS — M549 Dorsalgia, unspecified: Secondary | ICD-10-CM | POA: Diagnosis not present

## 2018-08-25 DIAGNOSIS — R41841 Cognitive communication deficit: Secondary | ICD-10-CM | POA: Diagnosis not present

## 2018-08-25 DIAGNOSIS — M6281 Muscle weakness (generalized): Secondary | ICD-10-CM | POA: Diagnosis not present

## 2018-08-28 DIAGNOSIS — G219 Secondary parkinsonism, unspecified: Secondary | ICD-10-CM | POA: Diagnosis not present

## 2018-08-28 DIAGNOSIS — F251 Schizoaffective disorder, depressive type: Secondary | ICD-10-CM | POA: Diagnosis not present

## 2018-08-28 DIAGNOSIS — F0390 Unspecified dementia without behavioral disturbance: Secondary | ICD-10-CM | POA: Diagnosis not present

## 2018-08-28 DIAGNOSIS — D8689 Sarcoidosis of other sites: Secondary | ICD-10-CM | POA: Diagnosis not present

## 2018-08-28 DIAGNOSIS — R41841 Cognitive communication deficit: Secondary | ICD-10-CM | POA: Diagnosis not present

## 2018-08-28 DIAGNOSIS — D509 Iron deficiency anemia, unspecified: Secondary | ICD-10-CM | POA: Diagnosis not present

## 2018-08-30 DIAGNOSIS — D8689 Sarcoidosis of other sites: Secondary | ICD-10-CM | POA: Diagnosis not present

## 2018-08-30 DIAGNOSIS — R41841 Cognitive communication deficit: Secondary | ICD-10-CM | POA: Diagnosis not present

## 2018-08-30 DIAGNOSIS — F251 Schizoaffective disorder, depressive type: Secondary | ICD-10-CM | POA: Diagnosis not present

## 2018-08-30 DIAGNOSIS — F0390 Unspecified dementia without behavioral disturbance: Secondary | ICD-10-CM | POA: Diagnosis not present

## 2018-08-30 DIAGNOSIS — G219 Secondary parkinsonism, unspecified: Secondary | ICD-10-CM | POA: Diagnosis not present

## 2018-08-30 DIAGNOSIS — D509 Iron deficiency anemia, unspecified: Secondary | ICD-10-CM | POA: Diagnosis not present

## 2018-09-03 DIAGNOSIS — R41841 Cognitive communication deficit: Secondary | ICD-10-CM | POA: Diagnosis not present

## 2018-09-03 DIAGNOSIS — F0390 Unspecified dementia without behavioral disturbance: Secondary | ICD-10-CM | POA: Diagnosis not present

## 2018-09-03 DIAGNOSIS — D509 Iron deficiency anemia, unspecified: Secondary | ICD-10-CM | POA: Diagnosis not present

## 2018-09-03 DIAGNOSIS — D8689 Sarcoidosis of other sites: Secondary | ICD-10-CM | POA: Diagnosis not present

## 2018-09-03 DIAGNOSIS — G219 Secondary parkinsonism, unspecified: Secondary | ICD-10-CM | POA: Diagnosis not present

## 2018-09-03 DIAGNOSIS — F251 Schizoaffective disorder, depressive type: Secondary | ICD-10-CM | POA: Diagnosis not present

## 2018-09-05 DIAGNOSIS — R41841 Cognitive communication deficit: Secondary | ICD-10-CM | POA: Diagnosis not present

## 2018-09-05 DIAGNOSIS — D509 Iron deficiency anemia, unspecified: Secondary | ICD-10-CM | POA: Diagnosis not present

## 2018-09-05 DIAGNOSIS — G219 Secondary parkinsonism, unspecified: Secondary | ICD-10-CM | POA: Diagnosis not present

## 2018-09-05 DIAGNOSIS — F251 Schizoaffective disorder, depressive type: Secondary | ICD-10-CM | POA: Diagnosis not present

## 2018-09-05 DIAGNOSIS — F0390 Unspecified dementia without behavioral disturbance: Secondary | ICD-10-CM | POA: Diagnosis not present

## 2018-09-05 DIAGNOSIS — D8689 Sarcoidosis of other sites: Secondary | ICD-10-CM | POA: Diagnosis not present

## 2018-09-10 DIAGNOSIS — D509 Iron deficiency anemia, unspecified: Secondary | ICD-10-CM | POA: Diagnosis not present

## 2018-09-10 DIAGNOSIS — R41841 Cognitive communication deficit: Secondary | ICD-10-CM | POA: Diagnosis not present

## 2018-09-10 DIAGNOSIS — G219 Secondary parkinsonism, unspecified: Secondary | ICD-10-CM | POA: Diagnosis not present

## 2018-09-10 DIAGNOSIS — D8689 Sarcoidosis of other sites: Secondary | ICD-10-CM | POA: Diagnosis not present

## 2018-09-10 DIAGNOSIS — F0390 Unspecified dementia without behavioral disturbance: Secondary | ICD-10-CM | POA: Diagnosis not present

## 2018-09-10 DIAGNOSIS — F251 Schizoaffective disorder, depressive type: Secondary | ICD-10-CM | POA: Diagnosis not present

## 2018-09-12 DIAGNOSIS — F251 Schizoaffective disorder, depressive type: Secondary | ICD-10-CM | POA: Diagnosis not present

## 2018-09-12 DIAGNOSIS — R41841 Cognitive communication deficit: Secondary | ICD-10-CM | POA: Diagnosis not present

## 2018-09-12 DIAGNOSIS — D8689 Sarcoidosis of other sites: Secondary | ICD-10-CM | POA: Diagnosis not present

## 2018-09-12 DIAGNOSIS — G219 Secondary parkinsonism, unspecified: Secondary | ICD-10-CM | POA: Diagnosis not present

## 2018-09-12 DIAGNOSIS — F0390 Unspecified dementia without behavioral disturbance: Secondary | ICD-10-CM | POA: Diagnosis not present

## 2018-09-12 DIAGNOSIS — D509 Iron deficiency anemia, unspecified: Secondary | ICD-10-CM | POA: Diagnosis not present

## 2019-01-28 DIAGNOSIS — M79673 Pain in unspecified foot: Secondary | ICD-10-CM | POA: Diagnosis not present

## 2019-01-28 DIAGNOSIS — G9340 Encephalopathy, unspecified: Secondary | ICD-10-CM | POA: Diagnosis not present

## 2019-01-28 DIAGNOSIS — L851 Acquired keratosis [keratoderma] palmaris et plantaris: Secondary | ICD-10-CM | POA: Diagnosis not present

## 2019-01-28 DIAGNOSIS — L603 Nail dystrophy: Secondary | ICD-10-CM | POA: Diagnosis not present

## 2019-01-28 DIAGNOSIS — B351 Tinea unguium: Secondary | ICD-10-CM | POA: Diagnosis not present

## 2019-01-28 DIAGNOSIS — R2689 Other abnormalities of gait and mobility: Secondary | ICD-10-CM | POA: Diagnosis not present

## 2019-02-13 DIAGNOSIS — F419 Anxiety disorder, unspecified: Secondary | ICD-10-CM | POA: Diagnosis not present

## 2019-02-13 DIAGNOSIS — F251 Schizoaffective disorder, depressive type: Secondary | ICD-10-CM | POA: Diagnosis not present

## 2019-03-27 DIAGNOSIS — F0390 Unspecified dementia without behavioral disturbance: Secondary | ICD-10-CM | POA: Diagnosis not present

## 2019-03-28 DIAGNOSIS — I10 Essential (primary) hypertension: Secondary | ICD-10-CM | POA: Diagnosis not present

## 2019-03-28 DIAGNOSIS — D649 Anemia, unspecified: Secondary | ICD-10-CM | POA: Diagnosis not present

## 2019-03-28 DIAGNOSIS — E876 Hypokalemia: Secondary | ICD-10-CM | POA: Diagnosis not present

## 2019-03-28 DIAGNOSIS — Z20828 Contact with and (suspected) exposure to other viral communicable diseases: Secondary | ICD-10-CM | POA: Diagnosis not present

## 2019-04-10 DIAGNOSIS — Z20828 Contact with and (suspected) exposure to other viral communicable diseases: Secondary | ICD-10-CM | POA: Diagnosis not present

## 2019-05-08 DIAGNOSIS — F419 Anxiety disorder, unspecified: Secondary | ICD-10-CM | POA: Diagnosis not present

## 2019-05-08 DIAGNOSIS — F251 Schizoaffective disorder, depressive type: Secondary | ICD-10-CM | POA: Diagnosis not present

## 2019-05-20 DIAGNOSIS — G9340 Encephalopathy, unspecified: Secondary | ICD-10-CM | POA: Diagnosis not present

## 2019-05-20 DIAGNOSIS — B351 Tinea unguium: Secondary | ICD-10-CM | POA: Diagnosis not present

## 2019-05-20 DIAGNOSIS — L608 Other nail disorders: Secondary | ICD-10-CM | POA: Diagnosis not present

## 2019-05-20 DIAGNOSIS — L84 Corns and callosities: Secondary | ICD-10-CM | POA: Diagnosis not present

## 2019-05-20 DIAGNOSIS — M79673 Pain in unspecified foot: Secondary | ICD-10-CM | POA: Diagnosis not present

## 2019-05-20 DIAGNOSIS — R2689 Other abnormalities of gait and mobility: Secondary | ICD-10-CM | POA: Diagnosis not present

## 2019-05-30 DIAGNOSIS — I10 Essential (primary) hypertension: Secondary | ICD-10-CM | POA: Diagnosis not present

## 2019-05-30 DIAGNOSIS — E876 Hypokalemia: Secondary | ICD-10-CM | POA: Diagnosis not present

## 2019-05-30 DIAGNOSIS — Z79899 Other long term (current) drug therapy: Secondary | ICD-10-CM | POA: Diagnosis not present

## 2019-05-30 DIAGNOSIS — D649 Anemia, unspecified: Secondary | ICD-10-CM | POA: Diagnosis not present

## 2019-06-06 ENCOUNTER — Emergency Department (HOSPITAL_COMMUNITY): Payer: Medicare Other

## 2019-06-06 ENCOUNTER — Emergency Department (HOSPITAL_COMMUNITY)
Admission: EM | Admit: 2019-06-06 | Discharge: 2019-06-07 | Disposition: A | Discharge status: Home / Self Care | Payer: Medicare Other | Attending: Emergency Medicine | Admitting: Emergency Medicine

## 2019-06-06 DIAGNOSIS — T7840XA Allergy, unspecified, initial encounter: Secondary | ICD-10-CM | POA: Diagnosis not present

## 2019-06-06 DIAGNOSIS — F259 Schizoaffective disorder, unspecified: Secondary | ICD-10-CM | POA: Diagnosis not present

## 2019-06-06 DIAGNOSIS — I10 Essential (primary) hypertension: Secondary | ICD-10-CM | POA: Insufficient documentation

## 2019-06-06 DIAGNOSIS — R21 Rash and other nonspecific skin eruption: Secondary | ICD-10-CM | POA: Diagnosis not present

## 2019-06-06 DIAGNOSIS — Z20828 Contact with and (suspected) exposure to other viral communicable diseases: Secondary | ICD-10-CM | POA: Diagnosis not present

## 2019-06-06 DIAGNOSIS — J019 Acute sinusitis, unspecified: Secondary | ICD-10-CM | POA: Diagnosis not present

## 2019-06-06 DIAGNOSIS — R509 Fever, unspecified: Secondary | ICD-10-CM

## 2019-06-06 DIAGNOSIS — Z79899 Other long term (current) drug therapy: Secondary | ICD-10-CM | POA: Insufficient documentation

## 2019-06-06 LAB — COMPREHENSIVE METABOLIC PANEL
ALT: 8 U/L (ref 0–44)
AST: 30 U/L (ref 15–41)
Albumin: 3.5 g/dL (ref 3.5–5.0)
Alkaline Phosphatase: 126 U/L (ref 38–126)
Anion gap: 11 (ref 5–15)
BUN: 19 mg/dL (ref 8–23)
CO2: 23 mmol/L (ref 22–32)
Calcium: 8.6 mg/dL — ABNORMAL LOW (ref 8.9–10.3)
Chloride: 102 mmol/L (ref 98–111)
Creatinine, Ser: 1.19 mg/dL — ABNORMAL HIGH (ref 0.44–1.00)
GFR calc Af Amer: 53 mL/min — ABNORMAL LOW (ref >60)
GFR calc non Af Amer: 46 mL/min — ABNORMAL LOW (ref >60)
Glucose, Bld: 105 mg/dL — ABNORMAL HIGH (ref 70–99)
Potassium: 3.3 mmol/L — ABNORMAL LOW (ref 3.5–5.1)
Sodium: 136 mmol/L (ref 135–145)
Total Bilirubin: 0.9 mg/dL (ref 0.3–1.2)
Total Protein: 5.7 g/dL — ABNORMAL LOW (ref 6.5–8.1)

## 2019-06-06 LAB — POC SARS CORONAVIRUS 2 AG -  ED: SARS Coronavirus 2 Ag: NEGATIVE

## 2019-06-06 MED ORDER — ACETAMINOPHEN 325 MG PO TABS
650.0000 mg | ORAL_TABLET | Freq: Once | ORAL | Status: AC
  Administered 2019-06-06: 22:00:00 650 mg via ORAL
  Filled 2019-06-06: qty 2

## 2019-06-06 NOTE — ED Notes (Signed)
Pt reports that she "feels fine." Denies any trouble swallowing at present. Denying any shortness of breath or difficulty breathing. Skin appears WDL, no hives noted upon assessment.

## 2019-06-06 NOTE — ED Provider Notes (Signed)
[facility] EMERGENCY DEPARTMENT Provider Note   CSN: [id number] Arrival date & time: 06/06/19  1845     History Chief Complaint  Patient presents with  . Allergic Reaction    Sheryl [name] is a 73 y.o. female.  HPI Patient is a 73 year old female with past medical history of hypertension, hyperlipidemia, schizoaffective disorder, sarcoidosis, IBS who presents to the ED with concerns for a rash.  Per patient, she states the staff at her living facility was concerned that her face was red.  They gave her 50 mg of p.o. Benadryl EMS was called.  EMS reports patient noted some difficulty swallowing and chest tightness so they gave IM epinephrine.  Patient states she has been feeling fine.  She denies any fever or recent illness.  Patient denies currently feeling like she is having any difficulty swallowing or pain in her chest.  She denies any difficulty breathing.  Patient denies any itching rash.  She denies any abdominal pain, nausea, vomiting.  Patient denies any dental pain.  She denies any voice change.  Past Medical History:  Diagnosis Date  . Allergy   . Cataract   . 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 DEGENERATIVE JOINT DISEASE 12/14/2006  . EXOGENOUS OBESITY 12/14/2006  . FIBROMYALGIA 12/14/2006  . Gallstones   . Hearing loss of left ear   . HYPERLIPIDEMIA 12/14/2006  . HYPERTENSION 12/14/2006  . IBS (irritable bowel syndrome)   . Neurosarcoidosis   . Sinusitis nasal 06/24/2016  . Sleep apnea    no CPAP  . Vision loss of right eye     Patient Active Problem List   Diagnosis Date Noted  . Sinusitis nasal 06/24/2016  . History of IBS 10/31/2014  . 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 Back pain 03/26/2014  . Preventive measure 03/26/2014  . Spinal stenosis 04/17/2012  . 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 Chronic lymphocytic leukemia ([place]) 12/14/2006  . Dyslipidemia 12/14/2006  . EXOGENOUS  OBESITY 12/14/2006  . Essential hypertension 12/14/2006  . Osteoarthritis 12/14/2006  . FIBROMYALGIA 12/14/2006    Past Surgical History:  Procedure Laterality Date  . BRAIN BIOPSY     [facility]  . CERVICAL FUSION    . CHOLECYSTECTOMY    . COLONOSCOPY    . LUMBAR FUSION  07/25/2012  . TONSILLECTOMY       OB History   No obstetric history on file.     Family History  Problem Relation Age of Onset  . Diabetes Sister   . Hypertension Mother   . Emphysema Father   . Colon cancer Neg Hx   . Esophageal cancer Neg Hx   . Rectal cancer Neg Hx   . Stomach cancer Neg Hx     Social History   Tobacco Use  . Smoking status: Never Smoker  . Smokeless tobacco: Never Used  Substance Use Topics  . Alcohol use: No    Alcohol/week: 0.0 standard drinks    Comment: One drink per year  . Drug use: No    Home Medications Prior to Admission medications   Medication Sig Start Date End Date Taking? Authorizing Provider  acetaminophen (TYLENOL) 325 MG tablet Take 650 mg by mouth every 6 (six) hours as needed for fever.    Yes [provider]  atorvastatin (LIPITOR) 40 MG tablet Take 1 tablet (40 mg total) by mouth daily. Patient taking differently: Take 40 mg by mouth at bedtime.  08/10/16  Yes Burnice Logan,  Doretha Sou, MD  brimonidine (ALPHAGAN) 0.15 % ophthalmic solution Place 1 drop into the left eye 3 (three) times daily.  09/03/14  Yes [provider]  carbidopa-levodopa (SINEMET IR) 25-100 MG tablet Take 1 tablet by mouth 3 (three) times daily.   Yes [provider]  donepezil (ARICEPT) 10 MG tablet Take 10 mg by mouth at bedtime.   Yes [provider]  FLUoxetine (PROZAC) 10 MG capsule Take 10 mg by mouth daily.   Yes [provider]  fluticasone (FLONASE) 50 MCG/ACT nasal spray Place 1 spray into both nostrils daily as needed for allergies.  10/16/12  Yes [provider]  loratadine (CLARITIN) 10 MG tablet Take 10 mg by mouth daily as  needed for allergies.   Yes [provider]  nystatin cream (MYCOSTATIN) Apply 1 application topically as needed for dry skin.   Yes [provider]  OLANZapine (ZYPREXA) 7.5 MG tablet Take 7.5 mg by mouth at bedtime.    Yes [provider]  pantoprazole (PROTONIX) 40 MG tablet Take 40 mg by mouth daily.   Yes [provider]  polyethylene glycol (MIRALAX / GLYCOLAX) packet Take 17 g by mouth daily as needed for mild constipation.    Yes [provider]  UNABLE TO FIND Take 120 mLs by mouth 2 (two) times daily. Med Name: [employer]   Yes [provider]  doxycycline (VIBRAMYCIN) 100 MG capsule Take 1 capsule (100 mg total) by mouth 2 (two) times daily. One po bid x 7 days 06/07/19   Veryl Speak, MD  EPINEPHrine 0.3 mg/0.3 mL IJ SOAJ injection Inject 0.3 mLs (0.3 mg total) into the muscle as needed for up to 1 dose for anaphylaxis. 06/07/19   Burns Spain, MD  vitamin B-12 (CYANOCOBALAMIN) 1000 MCG tablet Take 1 tablet (1,000 mcg total) by mouth daily. Patient not taking: Reported on 04/15/2017 02/06/17   Marletta Lor, MD    Allergies    Adhesive [tape], Latex, Cyclophosphamide, and Sulfa antibiotics  Review of Systems   Review of Systems  Constitutional: Positive for fever. Negative for chills.  HENT: Positive for congestion and sinus pressure. Negative for sore throat, trouble swallowing and voice change.   Respiratory: Negative for chest tightness and shortness of breath.   Cardiovascular: Negative for chest pain.  Gastrointestinal: Negative for abdominal pain, diarrhea, nausea and vomiting.  Genitourinary: Negative for dysuria.  Skin: Positive for rash.  Neurological: Negative for headaches.  Psychiatric/Behavioral: Negative for agitation and behavioral problems.    Physical Exam Updated Vital Signs BP (!) 142/54   Pulse 61   Temp (!) 101.6 F (38.7 C) (Rectal)   Resp (!) 22   Ht 5\' 2"  (1.575 m)   Wt 86.2 kg   SpO2  96%   BMI 34.75 kg/m   Physical Exam Vitals and nursing note reviewed.  Constitutional:      General: She is not in acute distress.    Appearance: She is well-developed.  HENT:     Head: Normocephalic and atraumatic.     Comments: Flushed face, specifically bilateral cheeks     Mouth/Throat:     Pharynx: Oropharynx is clear. No oropharyngeal exudate or posterior oropharyngeal erythema.  Eyes:     Extraocular Movements: Extraocular movements intact.     Conjunctiva/sclera: Conjunctivae normal.     Pupils: Pupils are equal, round, and reactive to light.  Cardiovascular:     Rate and Rhythm: Normal rate and regular rhythm.     Heart  sounds: No murmur.  Pulmonary:     Effort: Pulmonary effort is normal. No respiratory distress.     Breath sounds: Normal breath sounds.  Abdominal:     Palpations: Abdomen is soft.     Tenderness: There is no abdominal tenderness.  Musculoskeletal:     Cervical back: Normal range of motion and neck supple. No rigidity.  Skin:    General: Skin is warm and dry.  Neurological:     General: No focal deficit present.     Mental Status: She is alert and oriented to person, place, and time. Mental status is at baseline.     ED Results / Procedures / Treatments   Labs (all labs ordered are listed, but only abnormal results are displayed) Labs Reviewed  COMPREHENSIVE METABOLIC PANEL - Abnormal; Notable for the following components:      Result Value   Potassium 3.3 (*)    Glucose, Bld 105 (*)    Creatinine, Ser 1.19 (*)    Calcium 8.6 (*)    Total Protein 5.7 (*)    GFR calc non Af Amer 46 (*)    GFR calc Af Amer 53 (*)    All other components within normal limits  URINALYSIS, ROUTINE W REFLEX MICROSCOPIC - Abnormal; Notable for the following components:   APPearance TURBID (*)    Hgb urine dipstick SMALL (*)    Protein, ur 30 (*)    Leukocytes,Ua SMALL (*)    Bacteria, UA MANY (*)    Squamous Epithelial / LPF >50 (*)    All other components  within normal limits  CBC WITH DIFFERENTIAL/PLATELET - Abnormal; Notable for the following components:   Platelets 120 (*)    All other components within normal limits  SARS CORONAVIRUS 2 (TAT 6-24 HRS)  URINE CULTURE  CBC WITH DIFFERENTIAL/PLATELET  POC SARS CORONAVIRUS 2 AG -  ED    EKG EKG Interpretation  Date/Time:  Thursday June 06 2019 19:00:15 EST Ventricular Rate:  81 PR Interval:    QRS Duration: 87 QT Interval:  424 QTC Calculation: 493 R Axis:   -9 Text Interpretation: Sinus rhythm Low voltage, precordial leads Probable anteroseptal infarct, old Confirmed by Lennice Sites [phone]) on 06/06/2019 7:40:41 PM   Radiology DG Chest Portable 1 View  Result Date: 06/06/2019 CLINICAL DATA:  Fever EXAM: PORTABLE CHEST 1 VIEW COMPARISON:  Radiograph 11/28/2016 FINDINGS: No consolidation, features of edema, pneumothorax, or effusion. The cardiomediastinal contours are unremarkable. No acute osseous or soft tissue abnormality. Prior cervical and thoracolumbar fusions are noted. Hardware incompletely assessed on this exam. Degenerative changes are present in the imaged spine and shoulders. Telemetry leads overlie the chest. Cholecystectomy clips in the right upper quadrant. IMPRESSION: No acute cardiopulmonary abnormality. Electronically Signed   By: Lovena Le M.D.   On: 06/06/2019 21:42    Procedures Procedures (including critical care time)  Medications Ordered in ED Medications  acetaminophen (TYLENOL) tablet 650 mg (650 mg Oral Given 06/06/19 2218)    ED Course  I have reviewed the triage vital signs and the nursing notes.  Pertinent labs & imaging results that were available during my care of the patient were reviewed by me and considered in my medical decision making (see chart for details).    MDM Rules/Calculators/A&P  On arrival, patient is afebrile, hemodynamically stable. Given flushed face and warmth to palpation of skin, rectal temp obtained with T of  100.6  Patient speaking in full sentences.  She denies any difficulty swallowing or  speech difficulty.  Patient has chronic redundant tissue submandibularly without any fluctuance, induration or tenderness to palpation to suggest deep space infection/[place] angina.  Patient denies any dental pain.  Floor of mouth is soft with no tongue elevation.  Patient has full range of motion of her neck.  Denies headache.  Patient reports she has had some nasal congestion over the last week.  She did not know she had a fever until today.  She again denies any cough, difficulty breathing or other infectious symptoms.  Patient has not had any wheezing, itching, GI symptoms or obvious facial/oral swelling to suggest allergic reaction/flaxseeds.  However, given EMS is concerned that patient received epinephrine, will prescribe epinephrine pen.  Of note, patient denies any contact with known allergens.  Currently, patient has a fever without known origin although she does endorse nasal congestion and having a "sinus infection" over the last week.  This may be the source/sinusitis.  Chest x-ray without evidence of pneumonia.  Covid screen negative  Awaiting repeat CBC as first has not been resulted as well as urinalysis at time of handoff.  If screening labs are reassuring, feel patient will be stable for discharge plus/minus antibiotics pending UA.  Overall, patient has been observed with no allergic-like reactions symptoms.  However, given EMSs report, patient to be discharged with EpiPen.  Again, feel that redness of face was consistent with skin flushing in setting of fever.  No urticaria or rash noted.  Handoff given to Dr. Stark Jock during signout.  Anticipate discharge.   Final Clinical Impression(s) / ED Diagnoses Final diagnoses:  Allergic reaction, initial encounter  Fever in adult  Acute sinusitis, recurrence not specified, unspecified location    Rx / DC Orders ED Discharge Orders         Ordered     EPINEPHrine 0.3 mg/0.3 mL IJ SOAJ injection  As needed     06/07/19 0033    doxycycline (VIBRAMYCIN) 100 MG capsule  2 times daily     06/07/19 0318           Burns Spain, MD 06/07/19 Eek, Thompson, DO 06/09/19 [phone]

## 2019-06-06 NOTE — ED Triage Notes (Signed)
Pt BIB [facility] from [facility]. Staff at the facility reported that they noticed the patient had some difficulty swallowing,chest tightness, reddness to the face/neck. Pt given 0.3mg  of epinephrine 1:1000 and 50mg  of PO Benadryl prior to ED arrival. Pt 97% on room air for EMS. Pt reports feeling fine yesterday denies any recent illnesses.

## 2019-06-07 DIAGNOSIS — T7840XA Allergy, unspecified, initial encounter: Secondary | ICD-10-CM | POA: Diagnosis not present

## 2019-06-07 LAB — URINE CULTURE

## 2019-06-07 LAB — URINALYSIS, ROUTINE W REFLEX MICROSCOPIC
Bilirubin Urine: NEGATIVE
Glucose, UA: NEGATIVE mg/dL
Ketones, ur: NEGATIVE mg/dL
Nitrite: NEGATIVE
Protein, ur: 30 mg/dL — AB
Specific Gravity, Urine: 1.01 (ref 1.005–1.030)
Squamous Epithelial / HPF: >50 — ABNORMAL HIGH (ref 0–5)
pH: 6 (ref 5.0–8.0)

## 2019-06-07 LAB — CBC WITH DIFFERENTIAL/PLATELET
Abs Immature Granulocytes: 0.02 10*3/uL (ref 0.00–0.07)
Basophils Absolute: 0 10*3/uL (ref 0.0–0.1)
Basophils Relative: 0 %
Eosinophils Absolute: 0 10*3/uL (ref 0.0–0.5)
Eosinophils Relative: 0 %
HCT: 42.2 % (ref 36.0–46.0)
Hemoglobin: 13.8 g/dL (ref 12.0–15.0)
Immature Granulocytes: 0 %
Lymphocytes Relative: 16 %
Lymphs Abs: 1 10*3/uL (ref 0.7–4.0)
MCH: 28.8 pg (ref 26.0–34.0)
MCHC: 32.7 g/dL (ref 30.0–36.0)
MCV: 87.9 fL (ref 80.0–100.0)
Monocytes Absolute: 0.3 10*3/uL (ref 0.1–1.0)
Monocytes Relative: 6 %
Neutro Abs: 4.6 10*3/uL (ref 1.7–7.7)
Neutrophils Relative %: 78 %
Platelets: 120 10*3/uL — ABNORMAL LOW (ref 150–400)
RBC: 4.8 MIL/uL (ref 3.87–5.11)
RDW: 13.1 % (ref 11.5–15.5)
WBC: 5.9 10*3/uL (ref 4.0–10.5)
nRBC: 0 % (ref 0.0–0.2)

## 2019-06-07 LAB — SARS CORONAVIRUS 2 (TAT 6-24 HRS): SARS Coronavirus 2: NEGATIVE

## 2019-06-07 MED ORDER — DOXYCYCLINE HYCLATE 100 MG PO CAPS: 100.0000 mg | ORAL_CAPSULE | Freq: Two times a day (BID) | ORAL | 0 refills | Status: DC

## 2019-06-07 MED ORDER — EPINEPHRINE 0.3 MG/0.3ML IJ SOAJ: 0.3000 mg | INTRAMUSCULAR | 1 refills | Status: DC | PRN

## 2019-06-07 NOTE — Discharge Instructions (Addendum)
Begin taking doxycycline as prescribed.  Return to the emergency department if symptoms significantly worsen or change.

## 2019-06-07 NOTE — ED Provider Notes (Signed)
I have personally seen and examined the patient. I have reviewed the documentation on PMH/FH/Soc Hx. I have discussed the plan of care with the resident and patient.  I have reviewed and agree with the resident's documentation. Please see associated encounter note.  Briefly, the patient is a 73 y.o. female here with concern for allergic reaction.  Patient possibly has allergic type reaction where she states she felt some difficulty swallowing, chest tightness, redness to her face.  She did not have any GI symptoms.  She is given epinephrine, Benadryl in the field.  Overall upon my evaluation she has no symptoms.  She denies any shortness of breath.  No difficulty breathing.  She overall feels very well.  She does have history of schizoaffective disorder.  She denies any new medications.  Incidentally she is found to have a fever but otherwise normal vitals.  Has not had a fever, cough, sputum production.  Denies any urinary symptoms.  Overall she may have had an allergic reaction.  No signs of throat infection on exam.  Lab work thus far shows no significant electrolyte abnormality.  Covid test is negative.  Chest x-ray negative for infection.  Awaiting CBC and urinalysis.  Overall she may have viral process.  Will likely test for coronavirus with outpatient test given that she does not appear to have any respiratory symptoms.  May have some other type of virus/sinusitis. We will discharge her home with an EpiPen in case this was anaphylaxis.  We will have her follow-up with primary care doctor.  Patient overall hemodynamically stable throughout my care.  This chart was dictated using voice recognition software.  Despite best efforts to proofread,  errors can occur which can change the documentation meaning.     EKG Interpretation  Date/Time:  Thursday June 06 2019 19:00:15 EST Ventricular Rate:  81 PR Interval:    QRS Duration: 87 QT Interval:  424 QTC Calculation: 493 R Axis:   -9 Text  Interpretation: Sinus rhythm Low voltage, precordial leads Probable anteroseptal infarct, old Confirmed by Lennice Sites [phone]) on 06/06/2019 7:40:41 PM         Lennice Sites, DO 06/07/19 0007

## 2019-06-07 NOTE — ED Provider Notes (Signed)
Care assumed from Drs. Curatolo and Kerby at shift change.  Patient sent here from her extended care facility for evaluation of possible allergic reaction.  Patient apparently developed some facial redness earlier this evening.  She was given Benadryl and sent here by EMS.  Patient given Benadryl and epinephrine prior to arrival.  Care signed out to me awaiting results of laboratory studies and urinalysis.  Both of these have returned and are essentially unremarkable.  Patient reassessed and appears to be resting comfortably.  She is having no difficulty breathing and vitals are stable.  At this point, patient appears appropriate for discharge.  Patient did have a low-grade fever here in the ER, the etiology of which I am uncertain.  Her urinalysis is not suggestive of a UTI and initial Covid test is negative.  She has no other complaints that would explain this fever.     Veryl Speak, MD 06/07/19 [phone]

## 2019-06-07 NOTE — ED Notes (Signed)
Called [facility] for transport to [facility]

## 2020-06-02 ENCOUNTER — Emergency Department (HOSPITAL_COMMUNITY): Payer: Medicare Other

## 2020-06-02 ENCOUNTER — Emergency Department (HOSPITAL_COMMUNITY)
Admission: EM | Admit: 2020-06-02 | Discharge: 2020-06-03 | Disposition: A | Discharge status: Home / Self Care | Payer: Medicare Other | Attending: Emergency Medicine | Admitting: Emergency Medicine

## 2020-06-02 ENCOUNTER — Encounter (HOSPITAL_COMMUNITY): Payer: Self-pay | Admitting: Emergency Medicine

## 2020-06-02 ENCOUNTER — Other Ambulatory Visit: Payer: Self-pay

## 2020-06-02 DIAGNOSIS — T148XXA Other injury of unspecified body region, initial encounter: Secondary | ICD-10-CM | POA: Diagnosis not present

## 2020-06-02 DIAGNOSIS — Z91040 Latex allergy status: Secondary | ICD-10-CM | POA: Insufficient documentation

## 2020-06-02 DIAGNOSIS — R109 Unspecified abdominal pain: Secondary | ICD-10-CM | POA: Diagnosis present

## 2020-06-02 DIAGNOSIS — W19XXXA Unspecified fall, initial encounter: Secondary | ICD-10-CM | POA: Diagnosis not present

## 2020-06-02 DIAGNOSIS — Z79899 Other long term (current) drug therapy: Secondary | ICD-10-CM | POA: Insufficient documentation

## 2020-06-02 DIAGNOSIS — M7918 Myalgia, other site: Secondary | ICD-10-CM | POA: Insufficient documentation

## 2020-06-02 DIAGNOSIS — I10 Essential (primary) hypertension: Secondary | ICD-10-CM | POA: Insufficient documentation

## 2020-06-02 DIAGNOSIS — R251 Tremor, unspecified: Secondary | ICD-10-CM | POA: Diagnosis not present

## 2020-06-02 LAB — LIPASE, BLOOD: Lipase: 28 U/L (ref 11–51)

## 2020-06-02 LAB — COMPREHENSIVE METABOLIC PANEL
ALT: 7 U/L (ref 0–44)
AST: 24 U/L (ref 15–41)
Albumin: 3.6 g/dL (ref 3.5–5.0)
Alkaline Phosphatase: 122 U/L (ref 38–126)
Anion gap: 13 (ref 5–15)
BUN: 20 mg/dL (ref 8–23)
CO2: 20 mmol/L — ABNORMAL LOW (ref 22–32)
Calcium: 9.3 mg/dL (ref 8.9–10.3)
Chloride: 106 mmol/L (ref 98–111)
Creatinine, Ser: 1.23 mg/dL — ABNORMAL HIGH (ref 0.44–1.00)
GFR, Estimated: 47 mL/min — ABNORMAL LOW (ref >60)
Glucose, Bld: 100 mg/dL — ABNORMAL HIGH (ref 70–99)
Potassium: 3.1 mmol/L — ABNORMAL LOW (ref 3.5–5.1)
Sodium: 139 mmol/L (ref 135–145)
Total Bilirubin: 0.8 mg/dL (ref 0.3–1.2)
Total Protein: 5.9 g/dL — ABNORMAL LOW (ref 6.5–8.1)

## 2020-06-02 LAB — CBC WITH DIFFERENTIAL/PLATELET
Abs Immature Granulocytes: 0.03 10*3/uL (ref 0.00–0.07)
Basophils Absolute: 0.1 10*3/uL (ref 0.0–0.1)
Basophils Relative: 1 %
Eosinophils Absolute: 0.1 10*3/uL (ref 0.0–0.5)
Eosinophils Relative: 1 %
HCT: 42.1 % (ref 36.0–46.0)
Hemoglobin: 14.4 g/dL (ref 12.0–15.0)
Immature Granulocytes: 0 %
Lymphocytes Relative: 27 %
Lymphs Abs: 2.3 10*3/uL (ref 0.7–4.0)
MCH: 29 pg (ref 26.0–34.0)
MCHC: 34.2 g/dL (ref 30.0–36.0)
MCV: 84.9 fL (ref 80.0–100.0)
Monocytes Absolute: 0.6 10*3/uL (ref 0.1–1.0)
Monocytes Relative: 7 %
Neutro Abs: 5.6 10*3/uL (ref 1.7–7.7)
Neutrophils Relative %: 64 %
Platelets: 197 10*3/uL (ref 150–400)
RBC: 4.96 MIL/uL (ref 3.87–5.11)
RDW: 13.9 % (ref 11.5–15.5)
WBC: 8.6 10*3/uL (ref 4.0–10.5)
nRBC: 0 % (ref 0.0–0.2)

## 2020-06-02 LAB — PROTIME-INR
INR: 0.9 (ref 0.8–1.2)
Prothrombin Time: 12 seconds (ref 11.4–15.2)

## 2020-06-02 MED ORDER — POTASSIUM CHLORIDE CRYS ER 20 MEQ PO TBCR
40.0000 meq | EXTENDED_RELEASE_TABLET | Freq: Once | ORAL | Status: AC
  Administered 2020-06-02: 40 meq via ORAL
  Filled 2020-06-02: qty 2

## 2020-06-02 MED ORDER — IOHEXOL 300 MG/ML  SOLN
100.0000 mL | Freq: Once | INTRAMUSCULAR | Status: AC | PRN
  Administered 2020-06-02: 80 mL via INTRAVENOUS

## 2020-06-02 NOTE — ED Provider Notes (Signed)
[facility] EMERGENCY DEPARTMENT Provider Note   CSN: [id number] Arrival date & time: 06/02/20  1952     History Chief Complaint  Patient presents with  . Fall    Sheryl [name] is a 74 y.o. female.  The history is provided by the patient and medical records. No language interpreter was used.  Fall This is a new problem. The current episode started 1 to 2 hours ago. The problem occurs constantly. The problem has not changed since onset.Associated symptoms include abdominal pain. Pertinent negatives include no chest pain, no headaches and no shortness of breath. Nothing aggravates the symptoms. Nothing relieves the symptoms. She has tried nothing for the symptoms. The treatment provided no relief.       Past Medical History:  Diagnosis Date  . Allergy   . Cataract   . 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 DEGENERATIVE JOINT DISEASE 12/14/2006  . EXOGENOUS OBESITY 12/14/2006  . FIBROMYALGIA 12/14/2006  . Gallstones   . Hearing loss of left ear   . HYPERLIPIDEMIA 12/14/2006  . HYPERTENSION 12/14/2006  . IBS (irritable bowel syndrome)   . Neurosarcoidosis   . Sinusitis nasal 06/24/2016  . Sleep apnea    no CPAP  . Vision loss of right eye     Patient Active Problem List   Diagnosis Date Noted  . Sinusitis nasal 06/24/2016  . History of IBS 10/31/2014  . 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 Back pain 03/26/2014  . Preventive measure 03/26/2014  . Spinal stenosis 04/17/2012  . 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 Chronic lymphocytic leukemia ([place]) 12/14/2006  . Dyslipidemia 12/14/2006  . EXOGENOUS OBESITY 12/14/2006  . Essential hypertension 12/14/2006  . Osteoarthritis 12/14/2006  . FIBROMYALGIA 12/14/2006    Past Surgical History:  Procedure Laterality Date  . BRAIN BIOPSY     [facility]  . CERVICAL FUSION    . CHOLECYSTECTOMY    . COLONOSCOPY    . LUMBAR FUSION  07/25/2012  .  TONSILLECTOMY       OB History   No obstetric history on file.     Family History  Problem Relation Age of Onset  . Diabetes Sister   . Hypertension Mother   . Emphysema Father   . Colon cancer Neg Hx   . Esophageal cancer Neg Hx   . Rectal cancer Neg Hx   . Stomach cancer Neg Hx     Social History   Tobacco Use  . Smoking status: Never Smoker  . Smokeless tobacco: Never Used  Substance Use Topics  . Alcohol use: No    Alcohol/week: 0.0 standard drinks    Comment: One drink per year  . Drug use: No    Home Medications Prior to Admission medications   Medication Sig Start Date End Date Taking? Authorizing Provider  acetaminophen (TYLENOL) 325 MG tablet Take 650 mg by mouth every 6 (six) hours as needed for fever.     [provider]  atorvastatin (LIPITOR) 40 MG tablet Take 1 tablet (40 mg total) by mouth daily. Patient taking differently: Take 40 mg by mouth at bedtime.  08/10/16   Marletta Lor, MD  brimonidine (ALPHAGAN) 0.15 % ophthalmic solution Place 1 drop into the left eye 3 (three) times daily.  09/03/14   [provider]  carbidopa-levodopa (SINEMET IR) 25-100 MG tablet Take 1 tablet by mouth 3 (three) times daily.    [provider]  donepezil (ARICEPT) 10 MG  tablet Take 10 mg by mouth at bedtime.    [provider]  doxycycline (VIBRAMYCIN) 100 MG capsule Take 1 capsule (100 mg total) by mouth 2 (two) times daily. One po bid x 7 days 06/07/19   Veryl Speak, MD  EPINEPHrine 0.3 mg/0.3 mL IJ SOAJ injection Inject 0.3 mLs (0.3 mg total) into the muscle as needed for up to 1 dose for anaphylaxis. 06/07/19   Burns Spain, MD  FLUoxetine (PROZAC) 10 MG capsule Take 10 mg by mouth daily.    [provider]  fluticasone (FLONASE) 50 MCG/ACT nasal spray Place 1 spray into both nostrils daily as needed for allergies.  10/16/12   [provider]  loratadine (CLARITIN) 10 MG tablet Take 10 mg by mouth daily as  needed for allergies.    [provider]  nystatin cream (MYCOSTATIN) Apply 1 application topically as needed for dry skin.    [provider]  OLANZapine (ZYPREXA) 7.5 MG tablet Take 7.5 mg by mouth at bedtime.     [provider]  pantoprazole (PROTONIX) 40 MG tablet Take 40 mg by mouth daily.    [provider]  polyethylene glycol (MIRALAX / GLYCOLAX) packet Take 17 g by mouth daily as needed for mild constipation.     [provider]  UNABLE TO FIND Take 120 mLs by mouth 2 (two) times daily. Med Name: [facility]    [provider]  vitamin B-12 (CYANOCOBALAMIN) 1000 MCG tablet Take 1 tablet (1,000 mcg total) by mouth daily. Patient not taking: Reported on 04/15/2017 02/06/17   Marletta Lor, MD    Allergies    Adhesive [tape], Latex, Cyclophosphamide, and Sulfa antibiotics  Review of Systems   Review of Systems  Constitutional: Negative for chills, diaphoresis, fatigue and fever.  HENT: Negative for congestion.   Eyes: Negative for visual disturbance.  Respiratory: Negative for cough, chest tightness, shortness of breath and wheezing.   Cardiovascular: Negative for chest pain and palpitations.  Gastrointestinal: Positive for abdominal pain. Negative for diarrhea, nausea and vomiting.  Genitourinary: Negative for dysuria, flank pain and frequency.  Musculoskeletal: Positive for back pain. Negative for neck pain and neck stiffness.  Skin: Negative for rash and wound.  Neurological: Negative for weakness, light-headedness, numbness and headaches.  Psychiatric/Behavioral: Negative for agitation and confusion.  All other systems reviewed and are negative.   Physical Exam Updated Vital Signs BP (!) 152/66 (BP Location: Right Arm)   Pulse (!) 49   Temp 98.4 F (36.9 C) (Oral)   Resp 18   Ht 5\' 2"  (1.575 m)   Wt 86.2 kg   SpO2 100%   BMI 34.76 kg/m   Physical Exam Vitals and nursing note reviewed.  Constitutional:       General: She is not in acute distress.    Appearance: She is well-developed. She is obese. She is not ill-appearing, toxic-appearing or diaphoretic.  HENT:     Head: Normocephalic and atraumatic.     Right Ear: External ear normal.     Left Ear: External ear normal.     Nose: Nose normal. No congestion or rhinorrhea.     Mouth/Throat:     Mouth: Mucous membranes are moist.     Pharynx: No oropharyngeal exudate or posterior oropharyngeal erythema.  Eyes:     Extraocular Movements: Extraocular movements intact.     Conjunctiva/sclera: Conjunctivae normal.     Pupils: Pupils are equal, round, and reactive to light.  Cardiovascular:  Rate and Rhythm: Normal rate.     Pulses: Normal pulses.     Heart sounds: No murmur heard.   Pulmonary:     Effort: Pulmonary effort is normal. No respiratory distress.     Breath sounds: No stridor. No wheezing, rhonchi or rales.  Chest:     Chest wall: No tenderness.  Abdominal:     General: Abdomen is flat. There is no distension.     Tenderness: There is abdominal tenderness. There is guarding. There is no right CVA tenderness, left CVA tenderness or rebound.  Musculoskeletal:        General: Tenderness and signs of injury present. No swelling or deformity.     Cervical back: Normal range of motion and neck supple. No tenderness.     Right lower leg: No edema.     Left lower leg: No edema.  Skin:    General: Skin is warm.     Capillary Refill: Capillary refill takes less than 2 seconds.     Coloration: Skin is not pale.     Findings: Bruising present. No erythema or rash.  Neurological:     Mental Status: She is alert and oriented to person, place, and time.     Cranial Nerves: No cranial nerve deficit.     Sensory: No sensory deficit.     Motor: No weakness or abnormal muscle tone.     Coordination: Coordination normal.     Deep Tendon Reflexes: Reflexes are normal and symmetric.  Psychiatric:        Mood and Affect: Mood normal.      ED Results / Procedures / Treatments   Labs (all labs ordered are listed, but only abnormal results are displayed) Labs Reviewed  COMPREHENSIVE METABOLIC PANEL - Abnormal; Notable for the following components:      Result Value   Potassium 3.1 (*)    CO2 20 (*)    Glucose, Bld 100 (*)    Creatinine, Ser 1.23 (*)    Total Protein 5.9 (*)    GFR, Estimated 47 (*)    All other components within normal limits  CBC WITH DIFFERENTIAL/PLATELET  LIPASE, BLOOD  PROTIME-INR    EKG None  Radiology DG Pelvis 1-2 Views  Result Date: 06/02/2020 CLINICAL DATA:  Status post fall. EXAM: LEFT FEMUR 2 VIEWS; RIGHT FEMUR 2 VIEWS; PELVIS - 1-2 VIEW COMPARISON:  None. FINDINGS: There is no evidence of acute displaced fracture or dislocation of bilateral hips. No acute displaced fracture of the bones of the pelvis. No diastasis of the pelvis. Otherwise no aggressive appearing focal bone lesions. Visualized bilateral knees are grossly unremarkable. Partially visualized lumbar spine demonstrates surgical hardware. Soft tissues are unremarkable. IMPRESSION: 1. No acute displaced fracture or dislocation of bilateral hips. 2. No acute displaced fracture of bilateral femurs. 3. No acute displaced fracture or diastasis of the pelvis. Electronically Signed   By: Iven Finn M.D.   On: 06/02/2020 21:16   CT Head Wo Contrast  Result Date: 06/02/2020 CLINICAL DATA:  Fall EXAM: CT HEAD WITHOUT CONTRAST TECHNIQUE: Contiguous axial images were obtained from the base of the skull through the vertex without intravenous contrast. COMPARISON:  None. FINDINGS: Brain: No evidence of acute territorial infarction, hemorrhage, hydrocephalus,extra-axial collection or mass lesion/mass effect. There is dilatation the ventricles and sulci consistent with age-related atrophy. Low-attenuation changes in the deep white matter consistent with small vessel ischemia. Vascular: No hyperdense vessel or unexpected calcification.  Skull: The skull is intact. No fracture or  focal lesion identified. Sinuses/Orbits: Left maxillary mucous retention cyst is seen. The orbits and globes intact. Other: None IMPRESSION: No acute intracranial abnormality. Findings consistent with age related atrophy and chronic small vessel ischemia Left maxillary mucous retention cyst. Electronically Signed   By: Prudencio Pair M.D.   On: 06/02/2020 21:43   CT Chest W Contrast  Result Date: 06/02/2020 CLINICAL DATA:  74 year old female status post fall onto left side with pain. EXAM: CT CHEST, ABDOMEN, AND PELVIS WITH CONTRAST TECHNIQUE: Multidetector CT imaging of the chest, abdomen and pelvis was performed following the standard protocol during bolus administration of intravenous contrast. CONTRAST:  80mL OMNIPAQUE IOHEXOL 300 MG/ML  SOLN COMPARISON:  Lumbar spine CT today reported separately. Abdomen CT 10/08/2015. Lumbar spine CT 11/29/2016. Thoracic spine CT 02/07/2018. FINDINGS: CT CHEST FINDINGS Cardiovascular: Aberrant origin right subclavian artery. Calcified aorta and proximal great vessel atherosclerosis. Intact thoracic aorta. No cardiomegaly or pericardial effusion. Some calcified coronary artery atherosclerosis again evident. Mediastinum/Nodes: Negative. No mediastinal hematoma or lymphadenopathy. Lungs/Pleura: Mild respiratory motion. Major airways are patent. Lung volumes appear normal. Both lungs appear clear without pneumothorax, pleural effusion or pulmonary contusion. Musculoskeletal: Mild motion artifact. Partially visible lower cervical ACDF to the C7 level. Chronic lower thoracic posterior spinal fusion hardware beginning at T10. Stable thoracic vertebral height and alignment. Lower thoracic hardware appears stable and intact with solid-appearing posterior element arthrodesis which continues into the lumbar spine (reported separately today). Motion artifact limits evaluation of the sternum and ribs, with no obvious fracture identified. No  superficial soft tissue injury identified. CT ABDOMEN PELVIS FINDINGS Hepatobiliary: Streak artifact from spinal hardware, and mild motion artifact. Chronically absent gallbladder. No liver injury or perihepatic free fluid identified. Pancreas: Stable, negative. Spleen: Mild streak and motion artifact. No splenic injury identified. Adrenals/Urinary Tract: Allowing for streak artifact and motion the adrenal glands and kidneys appear stable since 2017, negative. Symmetric renal enhancement and contrast excretion. Normal proximal ureters. Unremarkable urinary bladder. Stomach/Bowel: Diverticulosis of the descending and sigmoid colon with no active inflammation. Mild retained stool throughout the large bowel. Normal appendix. No large bowel inflammation. Negative terminal ileum. No dilated small bowel. Possible small gastric hiatal hernia, otherwise decompressed, negative stomach. No free air, free fluid, mesenteric stranding. Vascular/Lymphatic: Aortoiliac calcified atherosclerosis. Major arterial structures appear patent and intact. There is dystrophic calcification of the unopacified right common femoral vein (series 3, image 119). Grossly patent portal venous system. No lymphadenopathy. Reproductive: Negative. Other: No pelvic free fluid. Musculoskeletal: Lumbar spine reported separately today. Visible sacrum and SI joints appear stable and intact. No pelvis or proximal femur fracture identified. No superficial soft tissue injury identified. IMPRESSION: 1. Study mildly degraded by motion and hardware streak artifact. No acute traumatic injury identified in the chest, abdomen, or pelvis. 2. Extensive prior spinal fusion. Lumbar Spine CT today reported separately. 3. Aortic Atherosclerosis (ICD10-I70.0). Electronically Signed   By: Genevie Ann M.D.   On: 06/02/2020 23:03   CT ABDOMEN PELVIS W CONTRAST  Result Date: 06/02/2020 CLINICAL DATA:  74 year old female status post fall onto left side with pain. EXAM: CT CHEST,  ABDOMEN, AND PELVIS WITH CONTRAST TECHNIQUE: Multidetector CT imaging of the chest, abdomen and pelvis was performed following the standard protocol during bolus administration of intravenous contrast. CONTRAST:  39mL OMNIPAQUE IOHEXOL 300 MG/ML  SOLN COMPARISON:  Lumbar spine CT today reported separately. Abdomen CT 10/08/2015. Lumbar spine CT 11/29/2016. Thoracic spine CT 02/07/2018. FINDINGS: CT CHEST FINDINGS Cardiovascular: Aberrant origin right subclavian artery. Calcified aorta and proximal great  vessel atherosclerosis. Intact thoracic aorta. No cardiomegaly or pericardial effusion. Some calcified coronary artery atherosclerosis again evident. Mediastinum/Nodes: Negative. No mediastinal hematoma or lymphadenopathy. Lungs/Pleura: Mild respiratory motion. Major airways are patent. Lung volumes appear normal. Both lungs appear clear without pneumothorax, pleural effusion or pulmonary contusion. Musculoskeletal: Mild motion artifact. Partially visible lower cervical ACDF to the C7 level. Chronic lower thoracic posterior spinal fusion hardware beginning at T10. Stable thoracic vertebral height and alignment. Lower thoracic hardware appears stable and intact with solid-appearing posterior element arthrodesis which continues into the lumbar spine (reported separately today). Motion artifact limits evaluation of the sternum and ribs, with no obvious fracture identified. No superficial soft tissue injury identified. CT ABDOMEN PELVIS FINDINGS Hepatobiliary: Streak artifact from spinal hardware, and mild motion artifact. Chronically absent gallbladder. No liver injury or perihepatic free fluid identified. Pancreas: Stable, negative. Spleen: Mild streak and motion artifact. No splenic injury identified. Adrenals/Urinary Tract: Allowing for streak artifact and motion the adrenal glands and kidneys appear stable since 2017, negative. Symmetric renal enhancement and contrast excretion. Normal proximal ureters. Unremarkable  urinary bladder. Stomach/Bowel: Diverticulosis of the descending and sigmoid colon with no active inflammation. Mild retained stool throughout the large bowel. Normal appendix. No large bowel inflammation. Negative terminal ileum. No dilated small bowel. Possible small gastric hiatal hernia, otherwise decompressed, negative stomach. No free air, free fluid, mesenteric stranding. Vascular/Lymphatic: Aortoiliac calcified atherosclerosis. Major arterial structures appear patent and intact. There is dystrophic calcification of the unopacified right common femoral vein (series 3, image 119). Grossly patent portal venous system. No lymphadenopathy. Reproductive: Negative. Other: No pelvic free fluid. Musculoskeletal: Lumbar spine reported separately today. Visible sacrum and SI joints appear stable and intact. No pelvis or proximal femur fracture identified. No superficial soft tissue injury identified. IMPRESSION: 1. Study mildly degraded by motion and hardware streak artifact. No acute traumatic injury identified in the chest, abdomen, or pelvis. 2. Extensive prior spinal fusion. Lumbar Spine CT today reported separately. 3. Aortic Atherosclerosis (ICD10-I70.0). Electronically Signed   By: Genevie Ann M.D.   On: 06/02/2020 23:03   CT T-SPINE NO CHARGE  Result Date: 06/02/2020 CLINICAL DATA:  74 year old female status post fall onto left side with pain. EXAM: CT THORACIC SPINE WITH CONTRAST TECHNIQUE: Multiplanar CT images of the thoracic spine were reconstructed from contemporary CT of the Chest. CONTRAST:  No additional COMPARISON:  CT Chest, Abdomen, and Pelvis today reported separately. Thoracic spine CT 02/07/2018. FINDINGS: Limited cervical spine imaging: Chronic lower cervical ACDF through C7 with evidence of C6-C7 solid arthrodesis. Cervicothoracic junction alignment is stable since 2019. Thoracic spine segmentation:  Normal. Alignment: Stable thoracic kyphosis. No thoracic spondylolisthesis. Vertebrae: Chronic  lower thoracic posterior fusion hardware beginning at T10 and continuing into the upper lumbar spine appears stable and intact. Solid-appearing posterior element arthrodesis at those levels. Stable thoracic vertebral height and alignment elsewhere. Flowing anterior endplate osteophytes in the midthoracic spine. No acute osseous abnormality identified. Paraspinal and other soft tissues: Stable thoracic paraspinal soft tissues, negative aside from chronic postoperative changes posteriorly. Chest and abdominal viscera reported separately today. Disc levels: Stable since 2019.  No CT evidence of thoracic spinal stenosis. IMPRESSION: 1. Stable CT appearance of the thoracic spine since 2019, no acute traumatic injury identified. 2.  CT Chest, Abdomen, and Pelvis today are reported separately. Electronically Signed   By: Genevie Ann M.D.   On: 06/02/2020 23:12   CT L-SPINE NO CHARGE  Result Date: 06/02/2020 CLINICAL DATA:  74 year old female status post fall onto left side  with pain. EXAM: CT LUMBAR SPINE WITH CONTRAST TECHNIQUE: Technique: Multiplanar CT images of the lumbar spine were reconstructed from contemporary CT of the Abdomen and Pelvis. CONTRAST:  No additional COMPARISON:  CT Chest, Abdomen, and Pelvis, CT Thoracic Spine today reported separately. Prior CT lumbar spine 11/29/2016. FINDINGS: Segmentation: Normal, concordant with the thoracic spine numbering today. Alignment: Stable lumbar lordosis since 2018. Vertebrae: Chronic posterior spinal fusion hardware from the lower thoracic spine through L5. Superimposed chronic interbody fusion implants at L3-L4 and L4-L5. At each level the lumbar hardware appears stable since 2018, and intact. No acute osseous abnormality identified. Visible sacrum and SI joints appear stable. Paraspinal and other soft tissues: Stable lumbar paraspinal soft tissues since 2018, including postoperative changes. Abdominal and pelvic viscera reported separately. Disc levels: Evidence of  solid posterior element arthrodesis from the lower thoracic spine through L5. Solid interbody arthrodesis also suspected at L3-L4 and L4-L5. Severe chronic facet arthropathy at L5-S1 bilaterally. Lumbar disc bulging and mostly far lateral endplate spurring at that level. No significant change since 2018. IMPRESSION: 1. No acute traumatic injury identified in the lumbar spine. 2. Stable CT appearance of the lumbar spine since 2018. 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 3. CT Abdomen and Pelvis today reported separately. Electronically Signed   By: Genevie Ann M.D.   On: 06/02/2020 23:17   DG Humerus Left  Result Date: 06/02/2020 CLINICAL DATA:  Pain status post fall EXAM: LEFT HUMERUS - 2+ VIEW COMPARISON:  None. FINDINGS: There is no evidence of fracture or other focal bone lesions. Soft tissues are unremarkable. IMPRESSION: Negative. Electronically Signed   By: Constance Holster M.D.   On: 06/02/2020 21:14   DG Humerus Right  Result Date: 06/02/2020 CLINICAL DATA:  Pain status post fall EXAM: RIGHT HUMERUS - 2+ VIEW COMPARISON:  None. FINDINGS: There is no evidence of fracture or other focal bone lesions. Soft tissues are unremarkable. IMPRESSION: Negative. Electronically Signed   By: Constance Holster M.D.   On: 06/02/2020 21:14   DG Hand Complete Left  Result Date: 06/02/2020 CLINICAL DATA:  Pain status post fall EXAM: LEFT HAND - COMPLETE 3+ VIEW COMPARISON:  None. FINDINGS: There is no evidence of fracture or dislocation. There is no evidence of arthropathy or other focal bone abnormality. Soft tissues are unremarkable. IMPRESSION: Negative. Electronically Signed   By: Constance Holster M.D.   On: 06/02/2020 22:54   DG Hand Complete Right  Result Date: 06/02/2020 CLINICAL DATA:  Pain status post fall EXAM: RIGHT HAND - COMPLETE 3+ VIEW COMPARISON:  None. FINDINGS: There is no evidence of fracture or dislocation. There is no evidence of  arthropathy or other focal bone abnormality. Soft tissues are unremarkable. IMPRESSION: Negative. Electronically Signed   By: Constance Holster M.D.   On: 06/02/2020 22:55   DG FEMUR MIN 2 VIEWS LEFT  Result Date: 06/02/2020 CLINICAL DATA:  Status post fall. EXAM: LEFT FEMUR 2 VIEWS; RIGHT FEMUR 2 VIEWS; PELVIS - 1-2 VIEW COMPARISON:  None. FINDINGS: There is no evidence of acute displaced fracture or dislocation of bilateral hips. No acute displaced fracture of the bones of the pelvis. No diastasis of the pelvis. Otherwise no aggressive appearing focal bone lesions. Visualized bilateral knees are grossly unremarkable. Partially visualized lumbar spine demonstrates surgical hardware. Soft tissues are unremarkable. IMPRESSION: 1. No acute displaced fracture or dislocation of bilateral hips. 2. No acute displaced fracture of bilateral femurs. 3. No acute displaced fracture or diastasis of the  pelvis. Electronically Signed   By: Iven Finn M.D.   On: 06/02/2020 21:16   DG FEMUR, MIN 2 VIEWS RIGHT  Result Date: 06/02/2020 CLINICAL DATA:  Status post fall. EXAM: LEFT FEMUR 2 VIEWS; RIGHT FEMUR 2 VIEWS; PELVIS - 1-2 VIEW COMPARISON:  None. FINDINGS: There is no evidence of acute displaced fracture or dislocation of bilateral hips. No acute displaced fracture of the bones of the pelvis. No diastasis of the pelvis. Otherwise no aggressive appearing focal bone lesions. Visualized bilateral knees are grossly unremarkable. Partially visualized lumbar spine demonstrates surgical hardware. Soft tissues are unremarkable. IMPRESSION: 1. No acute displaced fracture or dislocation of bilateral hips. 2. No acute displaced fracture of bilateral femurs. 3. No acute displaced fracture or diastasis of the pelvis. Electronically Signed   By: Iven Finn M.D.   On: 06/02/2020 21:16    Procedures Procedures (including critical care time)  Medications Ordered in ED Medications  potassium chloride SA (KLOR-CON) CR  tablet 40 mEq (has no administration in time range)  iohexol (OMNIPAQUE) 300 MG/ML solution 100 mL (80 mLs Intravenous Contrast Given 06/02/20 2214)    ED Course  I have reviewed the triage vital signs and the nursing notes.  Pertinent labs & imaging results that were available during my care of the patient were reviewed by me and considered in my medical decision making (see chart for details).    MDM Rules/Calculators/A&P                          Amali Uhls is a 74 y.o. female with a past medical history significant for hypertension, dyslipidemia, CLL, diabetes, degenerative joint disease, reports of multiple back surgeries, obesity, fibromyalgia, and neurosarcoidosis who presents after a fall. She reports that she was at [facility] facility when she was pushing a cart when they got out from under her and she fell onto the ground primarily hitting her left side and front of her body. She reports she is having pain in her bilateral knees, bilateral hips, abdomen, low back, mid back, bilateral shoulders, elbows, and hands. She reports she is right-handed. She did not hit her head and is not having any headache or neck pain. She denies any vision changes, nausea, or vomiting. She denies significant chest pain or shortness of breath but is having pain all across her upper mid back and her extremities.  On my exam, she is tender across her abdomen. I did hear bowel sounds. She had tenderness in both hips and pain with hip manipulation. She has tenderness in both knees but had intact sensation and strength in the feet. Good pulses in all extremities. She had intact strength in her upper extremities and normal sensation. Symmetric smile. Pupils are symmetric reactive normal extraocular movements. We rolled the patient and she did have tenderness across her mid back and low back. Lungs were clear.  Given the patient's past medical history with arthritis, multiple back surgeries, cancer,  and this significant fall with the severe pain, we will get imaging to look for acute injuries. She wants to wait on pain medicine till she gets the work-up back. We will get CTs of the torso minus the head and neck and then x-rays of the extremities where she is hurting. I am hopeful that she does not have any acute significant injuries other than soft tissue injuries.  Anticipate reassessment after work-up is completed.  9:03 PM Just now, the x-ray team reports that the  patient had an episode of shaking which may have appeared to be a seizure lasting around 5 to 10 seconds shaking all extremities.  Patient felt groggy while waking back up but did not appear to have a prolonged postictal.  She did not lose control her bowel or bladder or bite her tongue.  She denies any history of seizures.  Given the possibility that she may have had a shaking episode or seizure-like episode, we will get a CT of her head to make sure there are no acute abnormalities or bleeding.  9:17 PM Patient started having sudden onset severe 10 out of 10 headache all over her head.  She still denies any neck pain.  She is feeling very nervous and is rolling around the bed and discomfort.  We are going to try to get her to an exam room, get hooked up to constant monitoring, and I ordered a stat noncontrast head CT and asked to see if we could expedite as quickly as possible.  Patient was quickly taken to CT scanner where she had a head CT showing no evidence of intracranial hemorrhage.  Patient then had imaging of her chest/abdomen/pelvis which did not show any acute traumatic injuries.  She had mild hypokalemia and was given oral potassium.  All the rest of her x-ray imaging did not show any onset of acute traumatic injuries.  I suspect all soft tissue injuries.  She reports her pain and headache has resolved.  She is feeling much better and would like to go home  We went over all the results with the patient and patient  agrees that she is feeling well enough to go home.  She would like to go.  Patient will be discharged with plans to follow-up with a PCP and understands return precautions and being safe when trying to ambulate.  Due to the shaking episode, we also gave her instructions to follow-up with outpatient neurology although I do not think she needs to be started on any antiseizure medication as it is not confirmed that she had a seizure tonight.  I suspect she may have had a concussion with headache after trauma.  She had no other questions or concerns and was discharged.     Final Clinical Impression(s) / ED Diagnoses Final diagnoses:  Fall  Musculoskeletal pain  Episode of shaking    Rx / DC Orders ED Discharge Orders    None     Clinical Impression: 1. Musculoskeletal pain   2. Fall   3. Episode of shaking     Disposition: Discharge  Condition: Good  I have discussed the results, Dx and Tx plan with the pt(& family if present). He/she/they expressed understanding and agree(s) with the plan. Discharge instructions discussed at great length. Strict return precautions discussed and pt &/or family have verbalized understanding of the instructions. No further questions at time of discharge.    New Prescriptions   No medications on file    Follow Up: [facility] [address] [place] [phone]    Marletta Lor, [place] [address] [id number] mc [place]       Shelah Heatley, Gwenyth Allegra, MD 06/02/20 [phone]

## 2020-06-02 NOTE — ED Triage Notes (Signed)
Pt brought to ED by [facility] from [facility] after falling on her left side, pt c/o left shoulder, elbow, and bilateral knee pain. No LOC, no hitting her head. BP 150/70, HR 70, R 18, SPO2 100% RA.

## 2020-06-02 NOTE — ED Notes (Signed)
Assumed pt care at this time

## 2020-06-02 NOTE — Discharge Instructions (Signed)
SodaYour work-up today after your fall did not show any evidence of acute traumatic injuries.  With your headache you developed we suspect he may have had a concussion.  Shaking that you experienced while getting her x-rays it is unclear if it was a seizure however with this episode, we feel it is reasonable to have you follow-up with neurology.  The CT head did not showed evidence of acute intracranial injury or bleeding.  Your CTs of the chest/abdomen/pelvis also did not show any acute traumatic abnormalities.  If any symptoms change or worsen, please return to the nearest emergency department.

## 2020-06-02 NOTE — ED Notes (Signed)
Patient transported to X-ray 

## 2020-06-03 NOTE — ED Notes (Signed)
Pt to be discharged; report called to [place] at [facility].

## 2020-06-03 NOTE — ED Notes (Signed)
Transport here for pt; pt clean and dry on departure.  [facility] advised of pt's departure.

## 2020-06-06 ENCOUNTER — Emergency Department (HOSPITAL_COMMUNITY)
Admission: EM | Admit: 2020-06-06 | Discharge: 2020-06-06 | Disposition: A | Discharge status: Home / Self Care | Payer: Medicare Other | Attending: Emergency Medicine | Admitting: Emergency Medicine

## 2020-06-06 ENCOUNTER — Emergency Department (HOSPITAL_COMMUNITY): Payer: Medicare Other

## 2020-06-06 ENCOUNTER — Other Ambulatory Visit: Payer: Self-pay

## 2020-06-06 DIAGNOSIS — R030 Elevated blood-pressure reading, without diagnosis of hypertension: Secondary | ICD-10-CM | POA: Insufficient documentation

## 2020-06-06 DIAGNOSIS — Z91040 Latex allergy status: Secondary | ICD-10-CM | POA: Diagnosis not present

## 2020-06-06 DIAGNOSIS — M25521 Pain in right elbow: Secondary | ICD-10-CM | POA: Insufficient documentation

## 2020-06-06 DIAGNOSIS — M79601 Pain in right arm: Secondary | ICD-10-CM | POA: Insufficient documentation

## 2020-06-06 DIAGNOSIS — R519 Headache, unspecified: Secondary | ICD-10-CM | POA: Insufficient documentation

## 2020-06-06 DIAGNOSIS — I10 Essential (primary) hypertension: Secondary | ICD-10-CM | POA: Insufficient documentation

## 2020-06-06 DIAGNOSIS — M542 Cervicalgia: Secondary | ICD-10-CM | POA: Diagnosis not present

## 2020-06-06 DIAGNOSIS — M25561 Pain in right knee: Secondary | ICD-10-CM | POA: Diagnosis not present

## 2020-06-06 DIAGNOSIS — W1830XA Fall on same level, unspecified, initial encounter: Secondary | ICD-10-CM | POA: Insufficient documentation

## 2020-06-06 DIAGNOSIS — W19XXXA Unspecified fall, initial encounter: Secondary | ICD-10-CM

## 2020-06-06 DIAGNOSIS — M79604 Pain in right leg: Secondary | ICD-10-CM | POA: Insufficient documentation

## 2020-06-06 NOTE — ED Triage Notes (Signed)
Pt here from [facility], had mechanical fall from standing 30 mins PTA. No obvious injuries but has R elbow, R leg/hip, and R sided back pain. Baseline mentation per facility.

## 2020-06-06 NOTE — Discharge Instructions (Signed)
You were seen in the ED today after a fall. Your CT scans and x-rays are negative for any fracture or bleeding. Please call your PCP for a follow up appointment in the coming week. Return to the ED with any new or suddenly worsening symptoms.

## 2020-06-06 NOTE — ED Notes (Signed)
Spoke with niece, [name], number in chart, and she is unable to come sit with her but has stated that when she is out of her routine she gets agitated and anxious.

## 2020-06-06 NOTE — ED Notes (Signed)
Pt transported to xray 

## 2020-06-06 NOTE — ED Provider Notes (Signed)
Emergency Department Provider Note   I have reviewed the triage vital signs and the nursing notes.   HISTORY  Chief Complaint Fall   HPI Sheryl [name] is a 74 y.o. female with past medical history reviewed below presents to the emergency department from [facility] after a mechanical fall there.  Patient complains chronically of pain in the right arm and right leg which is unchanged today.  Staff at the facility reported to EMS that she is at her baseline mental status but that they would like evaluation after the fall.  She is not anticoagulated.  Patient recalls falling but cannot recall symptoms surrounding the fall.  She tells me "I got hurt" and points to her right arm and right leg as the source of her pain.  Level 5 caveat: Dementia   Past Medical History:  Diagnosis Date  . Allergy   . Cataract   . 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 DEGENERATIVE JOINT DISEASE 12/14/2006  . EXOGENOUS OBESITY 12/14/2006  . FIBROMYALGIA 12/14/2006  . Gallstones   . Hearing loss of left ear   . HYPERLIPIDEMIA 12/14/2006  . HYPERTENSION 12/14/2006  . IBS (irritable bowel syndrome)   . Neurosarcoidosis   . Sinusitis nasal 06/24/2016  . Sleep apnea    no CPAP  . Vision loss of right eye     Patient Active Problem List   Diagnosis Date Noted  . Sinusitis nasal 06/24/2016  . History of IBS 10/31/2014  . 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 Back pain 03/26/2014  . Preventive measure 03/26/2014  . Spinal stenosis 04/17/2012  . 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 Chronic lymphocytic leukemia ([place]) 12/14/2006  . Dyslipidemia 12/14/2006  . EXOGENOUS OBESITY 12/14/2006  . Essential hypertension 12/14/2006  . Osteoarthritis 12/14/2006  . FIBROMYALGIA 12/14/2006    Past Surgical History:  Procedure Laterality Date  . BRAIN BIOPSY     [facility]  . CERVICAL FUSION    . CHOLECYSTECTOMY    . COLONOSCOPY    . LUMBAR FUSION   07/25/2012  . TONSILLECTOMY      Allergies Adhesive [tape], Latex, Cyclophosphamide, and Sulfa antibiotics  Family History  Problem Relation Age of Onset  . Diabetes Sister   . Hypertension Mother   . Emphysema Father   . Colon cancer Neg Hx   . Esophageal cancer Neg Hx   . Rectal cancer Neg Hx   . Stomach cancer Neg Hx     Social History Social History   Tobacco Use  . Smoking status: Never Smoker  . Smokeless tobacco: Never Used  Substance Use Topics  . Alcohol use: No    Alcohol/week: 0.0 standard drinks    Comment: One drink per year  . Drug use: No    Review of Systems  Constitutional: No fever/chills Eyes: No visual changes. ENT: No sore throat. Cardiovascular: Denies chest pain. Respiratory: Denies shortness of breath. Gastrointestinal: No abdominal pain.  No nausea, no vomiting.  No diarrhea.  No constipation. Genitourinary: Negative for dysuria. Musculoskeletal: Negative for back pain. Positive right arm and leg pain.  Skin: Negative for rash. Neurological: Negative for headaches, focal weakness or numbness.  10-point ROS otherwise negative.  ____________________________________________   PHYSICAL EXAM:  VITAL SIGNS: ED Triage Vitals  Enc Vitals Group     BP 06/06/20 1205 (!) 131/120     Pulse Rate 06/06/20 1205 (!) 58     Resp 06/06/20 1205 16  Temp 06/06/20 1205 98.1 F (36.7 C)     Temp Source 06/06/20 1205 Oral     SpO2 06/06/20 1205 97 %   Constitutional: Alert and oriented. Well appearing and in no acute distress. Eyes: Conjunctivae are normal. PERRL. Head: Atraumatic. Nose: No congestion/rhinnorhea. Mouth/Throat: Mucous membranes are moist.   Neck: No stridor.  No cervical spine tenderness to palpation. Cardiovascular: Normal rate, regular rhythm. Good peripheral circulation. Grossly normal heart sounds.   Respiratory: Normal respiratory effort.  No retractions. Lungs CTAB. Gastrointestinal: Soft and nontender. No distention.   Musculoskeletal: RLE is externally rotated but I am able to perform passive ROM of the right hip and knee without apparent discomfort or limitation. Mild tenderness over the right elbow. No hematoma. No tenderness over the bilateral wrist or shoulders.  Neurologic:  Normal speech and language. No gross focal neurologic deficits are appreciated.  Skin:  Skin is warm, dry and intact. No rash noted.  ____________________________________________  RADIOLOGY  DG Elbow Complete Right  Result Date: 06/06/2020 CLINICAL DATA:  Right elbow pain after fall. EXAM: RIGHT ELBOW - COMPLETE 3+ VIEW COMPARISON:  None. FINDINGS: There is no evidence of fracture, dislocation, or joint effusion. There is no evidence of arthropathy or other focal bone abnormality. Soft tissues are unremarkable. IMPRESSION: Negative. Electronically Signed   By: Marijo Conception M.D.   On: 06/06/2020 14:11   CT Head Wo Contrast  Result Date: 06/06/2020 CLINICAL DATA:  Fall, head and neck trauma, pain EXAM: CT HEAD WITHOUT CONTRAST CT CERVICAL SPINE WITHOUT CONTRAST TECHNIQUE: Multidetector CT imaging of the head and cervical spine was performed following the standard protocol without intravenous contrast. Multiplanar CT image reconstructions of the cervical spine were also generated. COMPARISON:  06/02/2020 FINDINGS: CT HEAD FINDINGS Brain: No evidence of acute infarction, hemorrhage, hydrocephalus, extra-axial collection or mass lesion/mass effect. Prominent bilateral subdural spaces, unchanged compared to prior examination without evidence of hemorrhage. Periventricular and deep white matter hypodensity. Vascular: No hyperdense vessel or unexpected calcification. Skull: Normal. Negative for fracture or focal lesion. Sinuses/Orbits: No acute finding. Mucoid retention cyst of the left maxillary sinus. Other: None. CT CERVICAL SPINE FINDINGS Alignment: Normal. Skull base and vertebrae: No acute fracture. No primary bone lesion or focal  pathologic process. Soft tissues and spinal canal: No prevertebral fluid or swelling. No visible canal hematoma. Disc levels:  Redemonstrated ACDF of C3 through C7. Upper chest: Negative. Other: None. IMPRESSION: 1. No acute intracranial pathology. 2. Small-vessel white matter disease. 3. Prominent bilateral subdural spaces, unchanged compared to prior examination without evidence of hemorrhage; this may reflect global volume loss and/or hydration status. 4. No fracture or static subluxation of the cervical spine. 5. Redemonstrated ACDF of C3 through C7. Electronically Signed   By: Eddie Candle M.D.   On: 06/06/2020 14:21   CT Cervical Spine Wo Contrast  Result Date: 06/06/2020 CLINICAL DATA:  Fall, head and neck trauma, pain EXAM: CT HEAD WITHOUT CONTRAST CT CERVICAL SPINE WITHOUT CONTRAST TECHNIQUE: Multidetector CT imaging of the head and cervical spine was performed following the standard protocol without intravenous contrast. Multiplanar CT image reconstructions of the cervical spine were also generated. COMPARISON:  06/02/2020 FINDINGS: CT HEAD FINDINGS Brain: No evidence of acute infarction, hemorrhage, hydrocephalus, extra-axial collection or mass lesion/mass effect. Prominent bilateral subdural spaces, unchanged compared to prior examination without evidence of hemorrhage. Periventricular and deep white matter hypodensity. Vascular: No hyperdense vessel or unexpected calcification. Skull: Normal. Negative for fracture or focal lesion. Sinuses/Orbits: No acute finding. Mucoid retention  cyst of the left maxillary sinus. Other: None. CT CERVICAL SPINE FINDINGS Alignment: Normal. Skull base and vertebrae: No acute fracture. No primary bone lesion or focal pathologic process. Soft tissues and spinal canal: No prevertebral fluid or swelling. No visible canal hematoma. Disc levels:  Redemonstrated ACDF of C3 through C7. Upper chest: Negative. Other: None. IMPRESSION: 1. No acute intracranial pathology. 2.  Small-vessel white matter disease. 3. Prominent bilateral subdural spaces, unchanged compared to prior examination without evidence of hemorrhage; this may reflect global volume loss and/or hydration status. 4. No fracture or static subluxation of the cervical spine. 5. Redemonstrated ACDF of C3 through C7. Electronically Signed   By: Eddie Candle M.D.   On: 06/06/2020 14:21   DG Knee Complete 4 Views Right  Result Date: 06/06/2020 CLINICAL DATA:  Right knee pain after fall. EXAM: RIGHT KNEE - COMPLETE 4+ VIEW COMPARISON:  None. FINDINGS: No evidence of fracture, dislocation, or joint effusion. No evidence of arthropathy or other focal bone abnormality. Soft tissues are unremarkable. IMPRESSION: Negative. Electronically Signed   By: Marijo Conception M.D.   On: 06/06/2020 14:10   DG Hip Unilat W or Wo Pelvis 2-3 Views Right  Result Date: 06/06/2020 CLINICAL DATA:  Right hip pain after fall. EXAM: DG HIP (WITH OR WITHOUT PELVIS) 2-3V RIGHT COMPARISON:  None. FINDINGS: There is no evidence of hip fracture or dislocation. Mild osteophyte formation is noted around the right hip. IMPRESSION: Mild degenerative changes of the right hip. No acute abnormality is noted. Electronically Signed   By: Marijo Conception M.D.   On: 06/06/2020 14:09    ____________________________________________   PROCEDURES  Procedure(s) performed:   Procedures  None  ____________________________________________   INITIAL IMPRESSION / ASSESSMENT AND PLAN / ED COURSE  Pertinent labs & imaging results that were available during my care of the patient were reviewed by me and considered in my medical decision making (see chart for details).   Patient presents to the emergency department for evaluation of pain after fall.  Right arm and leg pain appear to be chronic by report but do plan on imaging here with patient being confused and unsure if his history is completely accurate.  Also obtain CT imaging of the head and  cervical spine.  Patient has no focal neurologic deficits.  Vital signs are largely within normal limits other than elevated blood pressure on arrival.  I do not see an indication for additional blood work at this time. Reviewed most recent ED visit and labs from 06/02/20. Labs performed at that time.   CT and plain film imaging reviewed. No acute findings. Plan for discharge back to SNF with PCP follow up plan in the coming week.  ____________________________________________  FINAL CLINICAL IMPRESSION(S) / ED DIAGNOSES  Final diagnoses:  Fall, initial encounter  Right arm pain  Right leg pain    Note:  This document was prepared using Dragon voice recognition software and may include unintentional dictation errors.  Nanda Quinton, MD, [facility]    Taji Sather, [facility], MD 06/07/20 [phone]

## 2020-06-06 NOTE — ED Notes (Signed)
Pt sleeping in hallway.

## 2020-06-06 NOTE — ED Notes (Signed)
Patient resting , no distress, respirations unlabored , denies pain , waiting for [facility] for transport.

## 2020-08-24 ENCOUNTER — Emergency Department (HOSPITAL_BASED_OUTPATIENT_CLINIC_OR_DEPARTMENT_OTHER): Payer: Medicare Other

## 2020-08-24 ENCOUNTER — Emergency Department (HOSPITAL_BASED_OUTPATIENT_CLINIC_OR_DEPARTMENT_OTHER)
Admission: EM | Admit: 2020-08-24 | Discharge: 2020-08-24 | Disposition: A | Discharge status: Home / Self Care | Payer: Medicare Other | Attending: Emergency Medicine | Admitting: Emergency Medicine

## 2020-08-24 ENCOUNTER — Other Ambulatory Visit: Payer: Self-pay

## 2020-08-24 ENCOUNTER — Encounter (HOSPITAL_BASED_OUTPATIENT_CLINIC_OR_DEPARTMENT_OTHER): Payer: Self-pay

## 2020-08-24 DIAGNOSIS — F606 Avoidant personality disorder: Secondary | ICD-10-CM | POA: Diagnosis not present

## 2020-08-24 DIAGNOSIS — R519 Headache, unspecified: Secondary | ICD-10-CM | POA: Insufficient documentation

## 2020-08-24 DIAGNOSIS — S161XXA Strain of muscle, fascia and tendon at neck level, initial encounter: Secondary | ICD-10-CM | POA: Diagnosis not present

## 2020-08-24 DIAGNOSIS — W050XXA Fall from non-moving wheelchair, initial encounter: Secondary | ICD-10-CM | POA: Diagnosis not present

## 2020-08-24 DIAGNOSIS — R251 Tremor, unspecified: Secondary | ICD-10-CM | POA: Diagnosis not present

## 2020-08-24 DIAGNOSIS — G3183 Dementia with Lewy bodies: Secondary | ICD-10-CM | POA: Insufficient documentation

## 2020-08-24 DIAGNOSIS — W19XXXA Unspecified fall, initial encounter: Secondary | ICD-10-CM

## 2020-08-24 DIAGNOSIS — S199XXA Unspecified injury of neck, initial encounter: Secondary | ICD-10-CM | POA: Diagnosis present

## 2020-08-24 DIAGNOSIS — Z91040 Latex allergy status: Secondary | ICD-10-CM | POA: Insufficient documentation

## 2020-08-24 MED ORDER — ACETAMINOPHEN 500 MG PO TABS
1000.0000 mg | ORAL_TABLET | Freq: Once | ORAL | Status: AC
  Administered 2020-08-24: 1000 mg via ORAL
  Filled 2020-08-24: qty 2

## 2020-08-24 NOTE — ED Notes (Signed)
[facility] called to arrange transport back to facility 

## 2020-08-24 NOTE — ED Notes (Signed)
Patient transported to X-ray 

## 2020-08-24 NOTE — ED Notes (Signed)
[facility] at bedside to return pt back to [place]

## 2020-08-24 NOTE — ED Provider Notes (Signed)
[facility] EMERGENCY DEPARTMENT Provider Note   CSN: [id number] Arrival date & time: 08/24/20  1649     History Chief Complaint  Patient presents with  . Fall    Sheryl [name] is a 75 y.o. female.  Pt presents to the ED today with a fall.  The pt has dementia and parkinson's disease.  She is in a wheelchair.  She fell out of her wheelchair today.  She complains of head and neck pain.  The pt did not have a loc and she is not on blood thinners.  Pt is unable to give any hx.        Past Medical History:  Diagnosis Date  . Allergy   . Cataract   . 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 DEGENERATIVE JOINT DISEASE 12/14/2006  . EXOGENOUS OBESITY 12/14/2006  . FIBROMYALGIA 12/14/2006  . Gallstones   . Hearing loss of left ear   . HYPERLIPIDEMIA 12/14/2006  . HYPERTENSION 12/14/2006  . IBS (irritable bowel syndrome)   . Neurosarcoidosis   . Sinusitis nasal 06/24/2016  . Sleep apnea    no CPAP  . Vision loss of right eye     Patient Active Problem List   Diagnosis Date Noted  . Sinusitis nasal 06/24/2016  . History of IBS 10/31/2014  . 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 Back pain 03/26/2014  . Preventive measure 03/26/2014  . Spinal stenosis 04/17/2012  . 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 Chronic lymphocytic leukemia ([place]) 12/14/2006  . Dyslipidemia 12/14/2006  . EXOGENOUS OBESITY 12/14/2006  . Essential hypertension 12/14/2006  . Osteoarthritis 12/14/2006  . FIBROMYALGIA 12/14/2006    Past Surgical History:  Procedure Laterality Date  . BRAIN BIOPSY     [facility]  . CERVICAL FUSION    . CHOLECYSTECTOMY    . COLONOSCOPY    . LUMBAR FUSION  07/25/2012  . TONSILLECTOMY       OB History   No obstetric history on file.     Family History  Problem Relation Age of Onset  . Diabetes Sister   . Hypertension Mother   . Emphysema Father   . Colon cancer Neg Hx   . Esophageal cancer Neg  Hx   . Rectal cancer Neg Hx   . Stomach cancer Neg Hx     Social History   Tobacco Use  . Smoking status: Never Smoker  . Smokeless tobacco: Never Used  Substance Use Topics  . Alcohol use: No    Alcohol/week: 0.0 standard drinks    Comment: One drink per year  . Drug use: No    Home Medications Prior to Admission medications   Medication Sig Start Date End Date Taking? Authorizing Provider  acetaminophen (TYLENOL) 325 MG tablet Take 650 mg by mouth every 6 (six) hours as needed for fever.     [provider]  atorvastatin (LIPITOR) 40 MG tablet Take 1 tablet (40 mg total) by mouth daily. Patient taking differently: Take 40 mg by mouth at bedtime.  08/10/16   Marletta Lor, MD  brimonidine (ALPHAGAN) 0.15 % ophthalmic solution Place 1 drop into the left eye 3 (three) times daily.  09/03/14   [provider]  carbidopa-levodopa (SINEMET IR) 25-100 MG tablet Take 1 tablet by mouth 3 (three) times daily.    [provider]  donepezil (ARICEPT) 10 MG tablet Take 10 mg by mouth at bedtime.    [provider]  doxycycline (VIBRAMYCIN)  100 MG capsule Take 1 capsule (100 mg total) by mouth 2 (two) times daily. One po bid x 7 days 06/07/19   Veryl Speak, MD  EPINEPHrine 0.3 mg/0.3 mL IJ SOAJ injection Inject 0.3 mLs (0.3 mg total) into the muscle as needed for up to 1 dose for anaphylaxis. 06/07/19   Burns Spain, MD  FLUoxetine (PROZAC) 10 MG capsule Take 10 mg by mouth daily.    [provider]  fluticasone (FLONASE) 50 MCG/ACT nasal spray Place 1 spray into both nostrils daily as needed for allergies.  10/16/12   [provider]  loratadine (CLARITIN) 10 MG tablet Take 10 mg by mouth daily as needed for allergies.    [provider]  nystatin cream (MYCOSTATIN) Apply 1 application topically as needed for dry skin.    [provider]  OLANZapine (ZYPREXA) 7.5 MG tablet Take 7.5 mg by mouth at bedtime.     [provider]  pantoprazole (PROTONIX) 40 MG tablet Take 40 mg by mouth daily.    [provider]  polyethylene glycol (MIRALAX / GLYCOLAX) packet Take 17 g by mouth daily as needed for mild constipation.     [provider]  UNABLE TO FIND Take 120 mLs by mouth 2 (two) times daily. Med Name: MedPass    [provider]  vitamin B-12 (CYANOCOBALAMIN) 1000 MCG tablet Take 1 tablet (1,000 mcg total) by mouth daily. Patient not taking: Reported on 04/15/2017 02/06/17   Marletta Lor, MD    Allergies    Adhesive [tape], Latex, Bactrim [sulfamethoxazole-trimethoprim], Cyclophosphamide, and Sulfa antibiotics  Review of Systems   Review of Systems  Unable to perform ROS: Dementia  Musculoskeletal: Positive for neck pain.  Neurological: Positive for headaches.    Physical Exam Updated Vital Signs BP (!) 118/96   Pulse 63   Temp 98.2 F (36.8 C) (Tympanic)   Resp 20   Ht 5\' 2"  (1.575 m)   Wt 72.6 kg   SpO2 96%   BMI 29.26 kg/m   Physical Exam Vitals and nursing note reviewed.  Constitutional:      Appearance: Normal appearance.  HENT:     Head: Normocephalic and atraumatic.     Right Ear: External ear normal.     Left Ear: External ear normal.     Nose: Nose normal.     Mouth/Throat:     Mouth: Mucous membranes are moist.     Pharynx: Oropharynx is clear.  Eyes:     Extraocular Movements: Extraocular movements intact.     Conjunctiva/sclera: Conjunctivae normal.     Pupils: Pupils are equal, round, and reactive to light.  Cardiovascular:     Rate and Rhythm: Normal rate and regular rhythm.     Pulses: Normal pulses.     Heart sounds: Normal heart sounds.  Pulmonary:     Effort: Pulmonary effort is normal.     Breath sounds: Normal breath sounds.  Abdominal:     General: Abdomen is flat. Bowel sounds are normal.     Palpations: Abdomen is soft.  Musculoskeletal:     Cervical back: Normal range of motion and neck supple.     Comments:  Pt is very stiff, but no deformities noted.  Skin:    General: Skin is warm.     Capillary Refill: Capillary refill takes less than 2 seconds.  Neurological:     General: No focal deficit present.     Mental Status: She is alert. Mental status  is at baseline.     Motor: Tremor present.  Psychiatric:        Mood and Affect: Mood is anxious.     ED Results / Procedures / Treatments   Labs (all labs ordered are listed, but only abnormal results are displayed) Labs Reviewed - No data to display  EKG None  Radiology DG Chest 2 View  Result Date: 08/24/2020 CLINICAL DATA:  Recent fall with chest pain, initial encounter EXAM: CHEST - 2 VIEW COMPARISON:  06/06/2019 FINDINGS: Cardiac shadow is within normal limits. Lungs are clear bilaterally. No acute bony abnormality is seen. Prior surgical changes in the cervical and thoracolumbar spine are again seen. IMPRESSION: No acute abnormality noted. Electronically Signed   By: Inez Catalina M.D.   On: 08/24/2020 17:46   DG Pelvis 1-2 Views  Result Date: 08/24/2020 CLINICAL DATA:  Recent fall with pelvic pain, initial encounter EXAM: PELVIS - 1-2 VIEW COMPARISON:  None. FINDINGS: Pelvic ring is intact. Mild degenerative changes of the hip joints are noted bilaterally. No acute fracture or dislocation is seen. Postsurgical changes in the lower lumbar spine are noted. IMPRESSION: No acute abnormality noted. Electronically Signed   By: Inez Catalina M.D.   On: 08/24/2020 17:53   CT Head Wo Contrast  Result Date: 08/24/2020 CLINICAL DATA:  Pain in the head and neck after falling while trying to get out of her wheelchair. No loss of consciousness. EXAM: CT HEAD WITHOUT CONTRAST CT CERVICAL SPINE WITHOUT CONTRAST TECHNIQUE: Multidetector CT imaging of the head and cervical spine was performed following the standard protocol without intravenous contrast. Multiplanar CT image reconstructions of the cervical spine were also generated. COMPARISON:  06/06/2020  FINDINGS: CT HEAD FINDINGS Brain: There is moderate central cortical atrophy. There is no intra or extra-axial fluid collection or mass lesion. The basilar cisterns and ventricles have a normal appearance. There is no CT evidence for acute infarction or hemorrhage. 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 There is no intra or extra-axial fluid collection or mass lesion. The basilar cisterns and ventricles have a normal appearance. There is no CT evidence for acute infarction or hemorrhage. Vascular: No hyperdense vessel or unexpected calcification. Skull: Normal. Negative for fracture or focal lesion. Sinuses/Orbits: Soft tissue density within the LEFT maxillary sinus is consistent with followup, mucoperiosteal thickening, or mucocele and is stable. Other: Study quality is degraded by patient motion artifact. CT CERVICAL SPINE FINDINGS Alignment: Remote anterior fusion of C3-C7.  Normal alignment. Skull base and vertebrae: No acute fracture. No primary bone lesion or focal pathologic process. Soft tissues and spinal canal: No prevertebral fluid or swelling. No visible canal hematoma. Disc levels:  Large osteophyte at C2-3 is stable. Upper chest: Negative. Other: None IMPRESSION: 1. No evidence for acute intracranial abnormality. 2. Atrophy. 3. 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 4. Remote anterior fusion of C3-C7. 5. No evidence for acute cervical spine abnormality. 6. Stable large osteophyte at C2-3. Electronically Signed   By: Nolon Nations M.D.   On: 08/24/2020 17:44   CT Cervical Spine Wo Contrast  Result Date: 08/24/2020 CLINICAL DATA:  Pain in the head and neck after falling while trying to get out of her wheelchair. No loss of consciousness. EXAM: CT HEAD WITHOUT CONTRAST CT CERVICAL SPINE WITHOUT CONTRAST TECHNIQUE: Multidetector CT imaging of the head and cervical spine was performed following the standard protocol without intravenous contrast. Multiplanar CT  image reconstructions of the cervical spine were also generated. COMPARISON:  06/06/2020 FINDINGS: CT HEAD FINDINGS Brain: There is moderate central cortical atrophy. There is no intra or extra-axial fluid collection or mass lesion. The basilar cisterns and ventricles have a normal appearance. There is no CT evidence for acute infarction or hemorrhage. 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 There is no intra or extra-axial fluid collection or mass lesion. The basilar cisterns and ventricles have a normal appearance. There is no CT evidence for acute infarction or hemorrhage. Vascular: No hyperdense vessel or unexpected calcification. Skull: Normal. Negative for fracture or focal lesion. Sinuses/Orbits: Soft tissue density within the LEFT maxillary sinus is consistent with followup, mucoperiosteal thickening, or mucocele and is stable. Other: Study quality is degraded by patient motion artifact. CT CERVICAL SPINE FINDINGS Alignment: Remote anterior fusion of C3-C7.  Normal alignment. Skull base and vertebrae: No acute fracture. No primary bone lesion or focal pathologic process. Soft tissues and spinal canal: No prevertebral fluid or swelling. No visible canal hematoma. Disc levels:  Large osteophyte at C2-3 is stable. Upper chest: Negative. Other: None IMPRESSION: 1. No evidence for acute intracranial abnormality. 2. Atrophy. 3. 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 4. Remote anterior fusion of C3-C7. 5. No evidence for acute cervical spine abnormality. 6. Stable large osteophyte at C2-3. Electronically Signed   By: Nolon Nations M.D.   On: 08/24/2020 17:44    Procedures Procedures   Medications Ordered in ED Medications  acetaminophen (TYLENOL) tablet 1,000 mg (1,000 mg Oral Given 08/24/20 1746)    ED Course  I have reviewed the triage vital signs and the nursing notes.  Pertinent labs & imaging results that were available during my care of the patient were  reviewed by me and considered in my medical decision making (see chart for details).    MDM Rules/Calculators/A&P                          No evidence of fx or injury.  Pt stable for d/c.  Return if worse.  Final Clinical Impression(s) / ED Diagnoses Final diagnoses:  Fall, initial encounter  Cervical strain, acute, initial encounter    Rx / DC Orders ED Discharge Orders    None       Isla Pence, MD 08/24/20 [phone]

## 2020-08-24 NOTE — ED Triage Notes (Signed)
Pt presents with pain to head and neck s/p fall while trying to get out of her wheelchair. Facility reports no LOC and pt is not on blood thinners.

## 2020-08-27 ENCOUNTER — Other Ambulatory Visit: Payer: Self-pay

## 2020-08-27 ENCOUNTER — Inpatient Hospital Stay (HOSPITAL_COMMUNITY)
Admission: EM | Admit: 2020-08-27 | Discharge: 2020-09-03 | DRG: 309 | Disposition: A | Discharge status: Nursing Home (Includes ICF) | Payer: Medicare Other | Source: Skilled Nursing Facility | Attending: Family Medicine | Admitting: Family Medicine

## 2020-08-27 ENCOUNTER — Emergency Department (HOSPITAL_COMMUNITY): Payer: Medicare Other

## 2020-08-27 DIAGNOSIS — F0280 Dementia in other diseases classified elsewhere without behavioral disturbance: Secondary | ICD-10-CM | POA: Diagnosis present

## 2020-08-27 DIAGNOSIS — Z79899 Other long term (current) drug therapy: Secondary | ICD-10-CM

## 2020-08-27 DIAGNOSIS — Z8249 Family history of ischemic heart disease and other diseases of the circulatory system: Secondary | ICD-10-CM

## 2020-08-27 DIAGNOSIS — I472 Ventricular tachycardia, unspecified: Secondary | ICD-10-CM

## 2020-08-27 DIAGNOSIS — D8689 Sarcoidosis of other sites: Secondary | ICD-10-CM | POA: Diagnosis present

## 2020-08-27 DIAGNOSIS — Z981 Arthrodesis status: Secondary | ICD-10-CM

## 2020-08-27 DIAGNOSIS — E6609 Other obesity due to excess calories: Secondary | ICD-10-CM | POA: Diagnosis present

## 2020-08-27 DIAGNOSIS — T43595A Adverse effect of other antipsychotics and neuroleptics, initial encounter: Secondary | ICD-10-CM | POA: Diagnosis present

## 2020-08-27 DIAGNOSIS — M797 Fibromyalgia: Secondary | ICD-10-CM | POA: Diagnosis present

## 2020-08-27 DIAGNOSIS — E099 Drug or chemical induced diabetes mellitus without complications: Secondary | ICD-10-CM | POA: Diagnosis present

## 2020-08-27 DIAGNOSIS — C9110 Chronic lymphocytic leukemia of B-cell type not having achieved remission: Secondary | ICD-10-CM | POA: Diagnosis present

## 2020-08-27 DIAGNOSIS — T43215A Adverse effect of selective serotonin and norepinephrine reuptake inhibitors, initial encounter: Secondary | ICD-10-CM | POA: Diagnosis present

## 2020-08-27 DIAGNOSIS — F259 Schizoaffective disorder, unspecified: Secondary | ICD-10-CM | POA: Diagnosis present

## 2020-08-27 DIAGNOSIS — T43225A Adverse effect of selective serotonin reuptake inhibitors, initial encounter: Secondary | ICD-10-CM | POA: Diagnosis present

## 2020-08-27 DIAGNOSIS — F419 Anxiety disorder, unspecified: Secondary | ICD-10-CM | POA: Diagnosis present

## 2020-08-27 DIAGNOSIS — E876 Hypokalemia: Secondary | ICD-10-CM | POA: Diagnosis present

## 2020-08-27 DIAGNOSIS — T424X5A Adverse effect of benzodiazepines, initial encounter: Secondary | ICD-10-CM | POA: Diagnosis present

## 2020-08-27 DIAGNOSIS — H468 Other optic neuritis: Secondary | ICD-10-CM | POA: Diagnosis present

## 2020-08-27 DIAGNOSIS — E785 Hyperlipidemia, unspecified: Secondary | ICD-10-CM | POA: Diagnosis present

## 2020-08-27 DIAGNOSIS — I462 Cardiac arrest due to underlying cardiac condition: Secondary | ICD-10-CM | POA: Diagnosis present

## 2020-08-27 DIAGNOSIS — I4721 Torsades de pointes: Secondary | ICD-10-CM

## 2020-08-27 DIAGNOSIS — I10 Essential (primary) hypertension: Secondary | ICD-10-CM | POA: Diagnosis present

## 2020-08-27 DIAGNOSIS — G4730 Sleep apnea, unspecified: Secondary | ICD-10-CM | POA: Diagnosis present

## 2020-08-27 DIAGNOSIS — Z6829 Body mass index (BMI) 29.0-29.9, adult: Secondary | ICD-10-CM

## 2020-08-27 DIAGNOSIS — H5461 Unqualified visual loss, right eye, normal vision left eye: Secondary | ICD-10-CM | POA: Diagnosis present

## 2020-08-27 DIAGNOSIS — Z66 Do not resuscitate: Secondary | ICD-10-CM | POA: Diagnosis present

## 2020-08-27 DIAGNOSIS — T380X5A Adverse effect of glucocorticoids and synthetic analogues, initial encounter: Secondary | ICD-10-CM | POA: Diagnosis present

## 2020-08-27 DIAGNOSIS — Z20822 Contact with and (suspected) exposure to covid-19: Secondary | ICD-10-CM | POA: Diagnosis present

## 2020-08-27 DIAGNOSIS — G20 Parkinson's disease: Secondary | ICD-10-CM | POA: Diagnosis present

## 2020-08-27 LAB — CBC WITH DIFFERENTIAL/PLATELET
Abs Immature Granulocytes: 0.24 10*3/uL — ABNORMAL HIGH (ref 0.00–0.07)
Basophils Absolute: 0.1 10*3/uL (ref 0.0–0.1)
Basophils Relative: 1 %
Eosinophils Absolute: 0.3 10*3/uL (ref 0.0–0.5)
Eosinophils Relative: 3 %
HCT: 40.5 % (ref 36.0–46.0)
Hemoglobin: 13.6 g/dL (ref 12.0–15.0)
Immature Granulocytes: 2 %
Lymphocytes Relative: 21 %
Lymphs Abs: 2.6 10*3/uL (ref 0.7–4.0)
MCH: 28.3 pg (ref 26.0–34.0)
MCHC: 33.6 g/dL (ref 30.0–36.0)
MCV: 84.2 fL (ref 80.0–100.0)
Monocytes Absolute: 0.3 10*3/uL (ref 0.1–1.0)
Monocytes Relative: 3 %
Neutro Abs: 8.9 10*3/uL — ABNORMAL HIGH (ref 1.7–7.7)
Neutrophils Relative %: 70 %
Platelets: 198 10*3/uL (ref 150–400)
RBC: 4.81 MIL/uL (ref 3.87–5.11)
RDW: 13.9 % (ref 11.5–15.5)
WBC: 12.5 10*3/uL — ABNORMAL HIGH (ref 4.0–10.5)
nRBC: 0 % (ref 0.0–0.2)

## 2020-08-27 LAB — COMPREHENSIVE METABOLIC PANEL
ALT: 14 U/L (ref 0–44)
AST: 16 U/L (ref 15–41)
Albumin: 3.5 g/dL (ref 3.5–5.0)
Alkaline Phosphatase: 119 U/L (ref 38–126)
Anion gap: 12 (ref 5–15)
BUN: 13 mg/dL (ref 8–23)
CO2: 23 mmol/L (ref 22–32)
Calcium: 9 mg/dL (ref 8.9–10.3)
Chloride: 104 mmol/L (ref 98–111)
Creatinine, Ser: 1.05 mg/dL — ABNORMAL HIGH (ref 0.44–1.00)
GFR, Estimated: 56 mL/min — ABNORMAL LOW (ref >60)
Glucose, Bld: 90 mg/dL (ref 70–99)
Potassium: 3.2 mmol/L — ABNORMAL LOW (ref 3.5–5.1)
Sodium: 139 mmol/L (ref 135–145)
Total Bilirubin: 0.8 mg/dL (ref 0.3–1.2)
Total Protein: 6.1 g/dL — ABNORMAL LOW (ref 6.5–8.1)

## 2020-08-27 LAB — PROTIME-INR
INR: 1 (ref 0.8–1.2)
Prothrombin Time: 12.8 seconds (ref 11.4–15.2)

## 2020-08-27 LAB — TROPONIN I (HIGH SENSITIVITY): Troponin I (High Sensitivity): 11 ng/L (ref <18)

## 2020-08-27 LAB — LACTIC ACID, PLASMA: Lactic Acid, Venous: 2 mmol/L (ref 0.5–1.9)

## 2020-08-27 LAB — I-STAT CHEM 8, ED
BUN: 12 mg/dL (ref 8–23)
Calcium, Ion: 1.11 mmol/L — ABNORMAL LOW (ref 1.15–1.40)
Chloride: 104 mmol/L (ref 98–111)
Creatinine, Ser: 1.1 mg/dL — ABNORMAL HIGH (ref 0.44–1.00)
Glucose, Bld: 87 mg/dL (ref 70–99)
HCT: 42 % (ref 36.0–46.0)
Hemoglobin: 14.3 g/dL (ref 12.0–15.0)
Potassium: 3.1 mmol/L — ABNORMAL LOW (ref 3.5–5.1)
Sodium: 143 mmol/L (ref 135–145)
TCO2: 24 mmol/L (ref 22–32)

## 2020-08-27 LAB — MAGNESIUM: Magnesium: 1.3 mg/dL — ABNORMAL LOW (ref 1.7–2.4)

## 2020-08-27 LAB — BRAIN NATRIURETIC PEPTIDE: B Natriuretic Peptide: 120 pg/mL — ABNORMAL HIGH (ref 0.0–100.0)

## 2020-08-27 LAB — TSH: TSH: 2.675 u[IU]/mL (ref 0.350–4.500)

## 2020-08-27 LAB — PHOSPHORUS: Phosphorus: 2.2 mg/dL — ABNORMAL LOW (ref 2.5–4.6)

## 2020-08-27 MED ORDER — AMIODARONE HCL IN DEXTROSE 360-4.14 MG/200ML-% IV SOLN
30.0000 mg/h | INTRAVENOUS | Status: DC
  Administered 2020-08-28: 30 mg/h via INTRAVENOUS
  Filled 2020-08-27: qty 200

## 2020-08-27 MED ORDER — AMIODARONE HCL IN DEXTROSE 360-4.14 MG/200ML-% IV SOLN
60.0000 mg/h | INTRAVENOUS | Status: AC
  Administered 2020-08-27: 60 mg/h via INTRAVENOUS
  Filled 2020-08-27 (×3): qty 200

## 2020-08-27 MED ORDER — POTASSIUM CHLORIDE 10 MEQ/100ML IV SOLN
10.0000 meq | INTRAVENOUS | Status: AC
  Administered 2020-08-27 – 2020-08-28 (×4): 10 meq via INTRAVENOUS
  Filled 2020-08-27 (×4): qty 100

## 2020-08-27 MED ORDER — MAGNESIUM SULFATE 2 GM/50ML IV SOLN
2.0000 g | Freq: Once | INTRAVENOUS | Status: AC
  Administered 2020-08-27: 2 g via INTRAVENOUS

## 2020-08-27 MED ORDER — MAGNESIUM SULFATE 2 GM/50ML IV SOLN
INTRAVENOUS | Status: AC
  Filled 2020-08-27: qty 50

## 2020-08-27 NOTE — ED Notes (Signed)
Attempted to apply mittens to pt. Pt started screaming and pulling on mittens. Pt told many time she has to keep IV and leads attached to her. Pt is no redirectable.

## 2020-08-27 NOTE — ED Triage Notes (Signed)
BIB [facility] from [facility]. Initial call out was for behavior problems. When EMS arrived pt was pale and heart rate in the 30s. Pt went into vtach ans started on amio. bolous  given. Pt started going in and out of Vtach. Pt has a sense of impending doom.

## 2020-08-27 NOTE — Progress Notes (Signed)
RT unable to get ABG pt is screaming and moving in the bed.

## 2020-08-27 NOTE — ED Provider Notes (Signed)
[facility] EMERGENCY DEPARTMENT Provider Note   CSN: [id number] Arrival date & time: 08/27/20  2106     History Chief Complaint  Patient presents with  . Sheryl [name]     Sheryl [name] is a 75 y.o. female.  HPI Patient is brought from [facility]  Reportedly, EMS was called for behavior disorder.  On arrival patient was found to be pale with heart rate in the 30s.  She was then noted to develop V. tach and was given a 150 mg IV dose of amiodarone with continued transport to the emergency department.  Patient has schizoaffective disorder.  She is on multiple psychotropic medications.  This includes Zyprexa, Ativan and Xanax.  She is also on Prozac.  Patient cannot give any meaningful history.  She is agitated, and repeating "please help me".    Past Medical History:  Diagnosis Date  . Allergy   . Cataract   . 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 DEGENERATIVE JOINT DISEASE 12/14/2006  . EXOGENOUS OBESITY 12/14/2006  . FIBROMYALGIA 12/14/2006  . Gallstones   . Hearing loss of left ear   . HYPERLIPIDEMIA 12/14/2006  . HYPERTENSION 12/14/2006  . IBS (irritable bowel syndrome)   . Neurosarcoidosis   . Sinusitis nasal 06/24/2016  . Sleep apnea    no CPAP  . Vision loss of right eye     Patient Active Problem List   Diagnosis Date Noted  . Sinusitis nasal 06/24/2016  . History of IBS 10/31/2014  . 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 Back pain 03/26/2014  . Preventive measure 03/26/2014  . Spinal stenosis 04/17/2012  . 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 Chronic lymphocytic leukemia ([place]) 12/14/2006  . Dyslipidemia 12/14/2006  . EXOGENOUS OBESITY 12/14/2006  . Essential hypertension 12/14/2006  . Osteoarthritis 12/14/2006  . FIBROMYALGIA 12/14/2006    Past Surgical History:  Procedure Laterality Date  . BRAIN BIOPSY     [facility]  . CERVICAL FUSION    . CHOLECYSTECTOMY    . COLONOSCOPY    .  LUMBAR FUSION  07/25/2012  . TONSILLECTOMY       OB History   No obstetric history on file.     Family History  Problem Relation Age of Onset  . Diabetes Sister   . Hypertension Mother   . Emphysema Father   . Colon cancer Neg Hx   . Esophageal cancer Neg Hx   . Rectal cancer Neg Hx   . Stomach cancer Neg Hx     Social History   Tobacco Use  . Smoking status: Never Smoker  . Smokeless tobacco: Never Used  Substance Use Topics  . Alcohol use: No    Alcohol/week: 0.0 standard drinks    Comment: One drink per year  . Drug use: No    Home Medications Prior to Admission medications   Medication Sig Start Date End Date Taking? Authorizing Provider  acetaminophen (TYLENOL) 325 MG tablet Take 650 mg by mouth every 6 (six) hours as needed for fever.     [provider]  atorvastatin (LIPITOR) 40 MG tablet Take 1 tablet (40 mg total) by mouth daily. Patient taking differently: Take 40 mg by mouth at bedtime.  08/10/16   Marletta Lor, MD  brimonidine (ALPHAGAN) 0.15 % ophthalmic solution Place 1 drop into the left eye 3 (three) times daily.  09/03/14   [provider]  carbidopa-levodopa (SINEMET IR) 25-100 MG tablet Take 1 tablet by  mouth 3 (three) times daily.    [provider]  donepezil (ARICEPT) 10 MG tablet Take 10 mg by mouth at bedtime.    [provider]  doxycycline (VIBRAMYCIN) 100 MG capsule Take 1 capsule (100 mg total) by mouth 2 (two) times daily. One po bid x 7 days 06/07/19   Veryl Speak, MD  EPINEPHrine 0.3 mg/0.3 mL IJ SOAJ injection Inject 0.3 mLs (0.3 mg total) into the muscle as needed for up to 1 dose for anaphylaxis. 06/07/19   Burns Spain, MD  FLUoxetine (PROZAC) 10 MG capsule Take 10 mg by mouth daily.    [provider]  fluticasone (FLONASE) 50 MCG/ACT nasal spray Place 1 spray into both nostrils daily as needed for allergies.  10/16/12   [provider]  loratadine (CLARITIN) 10 MG tablet Take  10 mg by mouth daily as needed for allergies.    [provider]  nystatin cream (MYCOSTATIN) Apply 1 application topically as needed for dry skin.    [provider]  OLANZapine (ZYPREXA) 7.5 MG tablet Take 7.5 mg by mouth at bedtime.     [provider]  pantoprazole (PROTONIX) 40 MG tablet Take 40 mg by mouth daily.    [provider]  polyethylene glycol (MIRALAX / GLYCOLAX) packet Take 17 g by mouth daily as needed for mild constipation.     [provider]  UNABLE TO FIND Take 120 mLs by mouth 2 (two) times daily. Med Name: MedPass    [provider]  vitamin B-12 (CYANOCOBALAMIN) 1000 MCG tablet Take 1 tablet (1,000 mcg total) by mouth daily. Patient not taking: Reported on 04/15/2017 02/06/17   Marletta Lor, MD    Allergies    Adhesive [tape], Latex, Bactrim [sulfamethoxazole-trimethoprim], Cyclophosphamide, and Sulfa antibiotics  Review of Systems   Review of Systems Level 5 caveat cannot obtain review of systems due to patient agitation and confusion Physical Exam Updated Vital Signs BP 110/64 (BP Location: Right Arm)   Pulse 88   Temp 97.8 F (36.6 C) (Oral)   Resp 19   SpO2 100%   Physical Exam Constitutional:      Comments: Patient is alert.  She does not have respiratory distress.  She is anxious and agitated.  Central obesity  HENT:     Head: Normocephalic and atraumatic.     Mouth/Throat:     Pharynx: Oropharynx is clear.  Eyes:     Extraocular Movements: Extraocular movements intact.  Cardiovascular:     Comments: Irregular, bradycardic with ectopy no gross rub murmur gallop Pulmonary:     Effort: Pulmonary effort is normal.     Breath sounds: Normal breath sounds.  Abdominal:     General: There is no distension.     Palpations: Abdomen is soft.     Tenderness: There is no abdominal tenderness. There is no guarding.  Musculoskeletal:        General: No swelling or tenderness. Normal range of  motion.     Right lower leg: No edema.     Left lower leg: No edema.  Skin:    General: Skin is warm and dry.  Neurological:     Comments: Patient is moving all extremities.  No focal flaccid paralysis.  Psychiatric:     Comments: Patient is agitated and intermittently tearful.     ED Results / Procedures / Treatments   Labs (all labs ordered are listed, but only abnormal results are displayed) Labs Reviewed  COMPREHENSIVE METABOLIC  PANEL - Abnormal; Notable for the following components:      Result Value   Potassium 3.2 (*)    Creatinine, Ser 1.05 (*)    Total Protein 6.1 (*)    GFR, Estimated 56 (*)    All other components within normal limits  BRAIN NATRIURETIC PEPTIDE - Abnormal; Notable for the following components:   B Natriuretic Peptide 120.0 (*)    All other components within normal limits  LACTIC ACID, PLASMA - Abnormal; Notable for the following components:   Lactic Acid, Venous 2.0 (*)    All other components within normal limits  CBC WITH DIFFERENTIAL/PLATELET - Abnormal; Notable for the following components:   WBC 12.5 (*)    Neutro Abs 8.9 (*)    Abs Immature Granulocytes 0.24 (*)    All other components within normal limits  MAGNESIUM - Abnormal; Notable for the following components:   Magnesium 1.3 (*)    All other components within normal limits  PHOSPHORUS - Abnormal; Notable for the following components:   Phosphorus 2.2 (*)    All other components within normal limits  I-STAT CHEM 8, ED - Abnormal; Notable for the following components:   Potassium 3.1 (*)    Creatinine, Ser 1.10 (*)    Calcium, Ion 1.11 (*)    All other components within normal limits  PROTIME-INR  TSH  LACTIC ACID, PLASMA  URINALYSIS, ROUTINE W REFLEX MICROSCOPIC  TROPONIN I (HIGH SENSITIVITY)  TROPONIN I (HIGH SENSITIVITY)    EKG EKG Interpretation  Date/Time:  Thursday August 27 2020 21:34:19 EST Ventricular Rate:  72 PR Interval:    QRS Duration: 94 QT  Interval:  522 QTC Calculation: 486 R Axis:   -4 Text Interpretation: Sinus rhythm Ventricular bigeminy Low voltage, precordial leads Abnormal R-wave progression, early transition Borderline prolonged QT interval agree Confirmed by Charlesetta Shanks [phone]) on 08/27/2020 11:41:21 PM   Radiology DG Chest Port 1 View  Result Date: 08/27/2020 CLINICAL DATA:  Dysrhythmia EXAM: PORTABLE CHEST 1 VIEW COMPARISON:  None. FINDINGS: The heart size and mediastinal contours are within normal limits. Aortic knob calcifications are seen. Both lungs are clear. The visualized skeletal structures are unremarkable. Spinal fixation hardware is noted. IMPRESSION: No active disease. Electronically Signed   By: Prudencio Pair M.D.   On: 08/27/2020 21:39    Procedures Procedures  CRITICAL CARE Performed by: Charlesetta Shanks   Total critical care time: 40 minutes  Critical care time was exclusive of separately billable procedures and treating other patients.  Critical care was necessary to treat or prevent imminent or life-threatening deterioration.  Critical care was time spent personally by me on the following activities: development of treatment plan with patient and/or surrogate as well as nursing, discussions with consultants, evaluation of patient's response to treatment, examination of patient, obtaining history from patient or surrogate, ordering and performing treatments and interventions, ordering and review of laboratory studies, ordering and review of radiographic studies, pulse oximetry and re-evaluation of patient's condition. Medications Ordered in ED Medications  amiodarone (NEXTERONE PREMIX) 360-4.14 MG/200ML-% (1.8 mg/mL) IV infusion (60 mg/hr Intravenous New Bag/Given 08/27/20 2132)  amiodarone (NEXTERONE PREMIX) 360-4.14 MG/200ML-% (1.8 mg/mL) IV infusion (has no administration in time range)  magnesium sulfate 2 GM/50ML IVPB (has no administration in time range)  potassium chloride 10 mEq in 100 mL  IVPB (10 mEq Intravenous New Bag/Given 08/28/20 0037)  magnesium sulfate IVPB 2 g 50 mL (0 g Intravenous Stopped 08/27/20 2150)  magnesium sulfate IVPB 2 g 50 mL (  0 g Intravenous Stopped 08/28/20 0033)    ED Course  I have reviewed the triage vital signs and the nursing notes.  Pertinent labs & imaging results that were available during my care of the patient were reviewed by me and considered in my medical decision making (see chart for details).    MDM Rules/Calculators/A&P                           Consult: Reviewed with cardiology Dr. Paticia Stack.  Recommends continuing magnesium replacement for greater than 2 and potassium replacement high normal.  Continue amiodarone drip.  Consult:PCM consulted.  Patient presents with paroxysmal V. tach.  This is intermittently had appearance of torsades.  Patient is on multiple QT prolongation medications for psychiatric management.  Patient was given amiodarone bolus by EMS.  On arrival, patient had bigeminy with short bursts of VT. amiodarone drip initiated.  Patient has had brief period of sinus rhythm in the 60s and 70s but returned back to bigeminy with occasional torsades.  We will continue aggressive electrolyte replacement and amiodarone.  Patient has DNR form with do not shock and no chest compressions, medical management and comfort care only. 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

## 2020-08-28 ENCOUNTER — Encounter (HOSPITAL_COMMUNITY): Payer: Self-pay | Admitting: Internal Medicine

## 2020-08-28 ENCOUNTER — Observation Stay (HOSPITAL_COMMUNITY): Payer: Medicare Other

## 2020-08-28 DIAGNOSIS — I4721 Torsades de pointes: Secondary | ICD-10-CM

## 2020-08-28 DIAGNOSIS — E876 Hypokalemia: Secondary | ICD-10-CM | POA: Diagnosis present

## 2020-08-28 DIAGNOSIS — Z515 Encounter for palliative care: Secondary | ICD-10-CM | POA: Diagnosis not present

## 2020-08-28 DIAGNOSIS — I472 Ventricular tachycardia: Principal | ICD-10-CM

## 2020-08-28 DIAGNOSIS — G20 Parkinson's disease: Secondary | ICD-10-CM | POA: Diagnosis not present

## 2020-08-28 DIAGNOSIS — I4581 Long QT syndrome: Secondary | ICD-10-CM | POA: Diagnosis not present

## 2020-08-28 DIAGNOSIS — F0280 Dementia in other diseases classified elsewhere without behavioral disturbance: Secondary | ICD-10-CM | POA: Diagnosis not present

## 2020-08-28 LAB — GLUCOSE, CAPILLARY: Glucose-Capillary: 76 mg/dL (ref 70–99)

## 2020-08-28 LAB — CBG MONITORING, ED
Glucose-Capillary: 83 mg/dL (ref 70–99)
Glucose-Capillary: 87 mg/dL (ref 70–99)

## 2020-08-28 LAB — URINALYSIS, ROUTINE W REFLEX MICROSCOPIC
Bilirubin Urine: NEGATIVE
Glucose, UA: NEGATIVE mg/dL
Hgb urine dipstick: NEGATIVE
Ketones, ur: NEGATIVE mg/dL
Nitrite: POSITIVE — AB
Protein, ur: NEGATIVE mg/dL
Specific Gravity, Urine: 1.006 (ref 1.005–1.030)
pH: 6 (ref 5.0–8.0)

## 2020-08-28 LAB — BASIC METABOLIC PANEL
Anion gap: 11 (ref 5–15)
BUN: 10 mg/dL (ref 8–23)
CO2: 24 mmol/L (ref 22–32)
Calcium: 8.7 mg/dL — ABNORMAL LOW (ref 8.9–10.3)
Chloride: 108 mmol/L (ref 98–111)
Creatinine, Ser: 1.01 mg/dL — ABNORMAL HIGH (ref 0.44–1.00)
GFR, Estimated: 59 mL/min — ABNORMAL LOW (ref >60)
Glucose, Bld: 96 mg/dL (ref 70–99)
Potassium: 4.2 mmol/L (ref 3.5–5.1)
Sodium: 143 mmol/L (ref 135–145)

## 2020-08-28 LAB — ECHOCARDIOGRAM COMPLETE
AR max vel: 1.62 cm2
AV Area VTI: 1.61 cm2
AV Area mean vel: 1.62 cm2
AV Mean grad: 4 mmHg
AV Peak grad: 7.7 mmHg
Ao pk vel: 1.39 m/s
Area-P 1/2: 3.03 cm2
MV M vel: 5.83 m/s
MV Peak grad: 136 mmHg
MV VTI: 1.26 cm2
S' Lateral: 2.3 cm

## 2020-08-28 LAB — TROPONIN I (HIGH SENSITIVITY): Troponin I (High Sensitivity): 8 ng/L (ref <18)

## 2020-08-28 LAB — RESP PANEL BY RT-PCR (FLU A&B, COVID) ARPGX2
Influenza A by PCR: NEGATIVE
Influenza B by PCR: NEGATIVE
SARS Coronavirus 2 by RT PCR: NEGATIVE

## 2020-08-28 LAB — MAGNESIUM: Magnesium: 2.1 mg/dL (ref 1.7–2.4)

## 2020-08-28 LAB — LACTIC ACID, PLASMA: Lactic Acid, Venous: 1.2 mmol/L (ref 0.5–1.9)

## 2020-08-28 MED ORDER — MAGNESIUM SULFATE 2 GM/50ML IV SOLN
2.0000 g | Freq: Once | INTRAVENOUS | Status: AC
  Administered 2020-08-28: 2 g via INTRAVENOUS
  Filled 2020-08-28: qty 50

## 2020-08-28 MED ORDER — POTASSIUM CHLORIDE IN NACL 40-0.9 MEQ/L-% IV SOLN
INTRAVENOUS | Status: AC
  Filled 2020-08-28: qty 1000

## 2020-08-28 MED ORDER — ISOPROTERENOL HCL 0.2 MG/ML IJ SOLN
2.0000 ug/min | INTRAVENOUS | Status: DC
  Administered 2020-08-28: 3 ug/min via INTRAVENOUS
  Administered 2020-08-28: 2 ug/min via INTRAVENOUS
  Administered 2020-08-29: 3 ug/min via INTRAVENOUS
  Administered 2020-08-29 (×2): 4 ug/min via INTRAVENOUS
  Administered 2020-08-29: 3 ug/min via INTRAVENOUS
  Administered 2020-08-29: 4 ug/min via INTRAVENOUS
  Administered 2020-08-30: 3 ug/min via INTRAVENOUS
  Filled 2020-08-28 (×11): qty 5

## 2020-08-28 MED ORDER — CHLORHEXIDINE GLUCONATE CLOTH 2 % EX PADS
6.0000 | MEDICATED_PAD | Freq: Every day | CUTANEOUS | Status: DC
  Administered 2020-08-29 – 2020-09-02 (×5): 6 via TOPICAL

## 2020-08-28 MED ORDER — K PHOS MONO-SOD PHOS DI & MONO 155-852-130 MG PO TABS
500.0000 mg | ORAL_TABLET | Freq: Two times a day (BID) | ORAL | Status: AC
  Administered 2020-08-28 (×2): 500 mg via ORAL
  Filled 2020-08-28 (×3): qty 2

## 2020-08-28 MED ORDER — LORAZEPAM 2 MG/ML IJ SOLN: 0.5000 mg | Freq: Three times a day (TID) | INTRAMUSCULAR | Status: DC | PRN

## 2020-08-28 MED ORDER — PROCHLORPERAZINE EDISYLATE 10 MG/2ML IJ SOLN
5.0000 mg | INTRAMUSCULAR | Status: DC | PRN
  Filled 2020-08-28: qty 1

## 2020-08-28 MED ORDER — ACETAMINOPHEN 325 MG PO TABS: 650.0000 mg | ORAL_TABLET | Freq: Four times a day (QID) | ORAL | Status: DC | PRN

## 2020-08-28 MED ORDER — ENOXAPARIN SODIUM 40 MG/0.4ML ~~LOC~~ SOLN
40.0000 mg | SUBCUTANEOUS | Status: DC
  Administered 2020-08-28 – 2020-09-02 (×6): 40 mg via SUBCUTANEOUS
  Filled 2020-08-28 (×6): qty 0.4

## 2020-08-28 MED ORDER — ACETAMINOPHEN 650 MG RE SUPP: 650.0000 mg | Freq: Four times a day (QID) | RECTAL | Status: DC | PRN

## 2020-08-28 MED ORDER — HYDRALAZINE HCL 20 MG/ML IJ SOLN: 5.0000 mg | Freq: Four times a day (QID) | INTRAMUSCULAR | Status: DC | PRN

## 2020-08-28 MED ORDER — LORAZEPAM 2 MG/ML IJ SOLN
0.5000 mg | INTRAMUSCULAR | Status: DC | PRN
  Administered 2020-08-29: 0.5 mg via INTRAVENOUS
  Filled 2020-08-28: qty 1

## 2020-08-28 NOTE — Consult Note (Signed)
NAME:  Sheryl [name], MRN:  [id number], DOB:  [birth year], LOS: 0 ADMISSION DATE:  08/27/2020, CONSULTATION DATE:  08/28/2020 REFERRING MD:  Antonieta Pert CHIEF COMPLAINT:  Arrhythmia    Brief History:  75 year old female brought to the emergency department from local skilled nursing facility, [facility], for reported behavioral disturbances however on arrival patient was found pale with bradycardia and later developed V. tach.  PCCM consulted for persistent arrhythmias requiring transfer ICU for continuous antiarrhythmic infusion  History of Present Illness:  Sheryl [name] is a 75 y.o. with a past medical history of schizoaffective disorder, sleep apnea no on CPAP, chronic lymphocytic leukemia, DJD, fibromyalgia, HTN. 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 EMS was initially called for complaints of behavorial disturbances but on EMS arrival she was seen pale and significantly bradycardic. Per report patent then developed Vtach and was given 150 of amiodarone and transported to the ED. Patient is on multiple psychotropic medications including; Zyprexa, Ativan, Xanax, and Prozac.   On arrival patient was seen bradycardic and mildly hypertensive. Labwork significant for K 3.2, Creatinine 1.05, phosp 2.2, Mg 1.3, BNP 120, WBC 12.5, and Lactic 2.0. CXR negative Patient was seen by cariology and it was felt she arrhythmia was secondary to polymorphic ventricular tachycardia in the setting of significantly prolonged QTc. Given need for Isoproterenol drip PCCM was consulted for assistance in transfer to ICU and management of medical conditions.   Past Medical History:  Schizoaffective disorder, sleep apnea no on CPAP, chronic lymphocytic leukemia, DJD, fibromyalgia, HTN. 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 rate 18, SpO2 97 %.       No intake or output data in the 24 hours ending 08/28/20 1610 There were no vitals filed for this visit.  Examination: General: Chronically ill appearing elderly female lying in bed in NAD HEENT: [place]/AT, MM pink/moist, PERRL,  Neuro: Alert x3 with some underlying confusion  CV: s1s2 regular rate and rhythm, no murmur, rubs, or gallops,  PULM:  Clear to ascultation bilaterally, no added breath sounds, no increased work of breathing  GI: soft, bowel sounds active in all 4 quadrants, non-tender, non-distended Extremities: warm/dry, no edema  Skin: no rashes or lesions  Resolved Hospital Problem list     Assessment & Plan:  Polymorphic ventricular tachycardia/Torsade de pointes  QTc prolongation -Patent is on multiple multiple psychotropic medications including; Zyprexa, Ativan, Xanax, and Prozac -QTc on admission was nearly 560  P: Primary management per cardiology Transfer to ICU for Isoproterenol infusion to increase HR in attempt to lower QTC Continuous telemetry  Avoid all QTc prolonging medications Repeat EKG in AM Repeat 2gm magnesium now  Optimize electrolytes   Hypomagnesemia Hypokalemia  Hypophosphatemia P: Trend Bmet Supplement as needed   Schizoaffective  -Patient is on multiple psychotropic medications including; Zyprexa, Ativan, Xanax, and Prozac.  P: Hold all QTc prolonging meds  Supportive care     Chronic lymphocytic leukemia P: Follow up as  outpatient Supportive care  Essential hypertension  -Awaiting medication reconciliation  P: Monitor  hemodynamics in the ICU setting    Best practice (evaluated daily)  Diet: Cardiac  Pain/Anxiety/Delirium protocol (if indicated): As needed  VAP protocol (if indicated): N/A DVT prophylaxis: Lovenox GI prophylaxis: PPI Glucose control: Monitor  Mobility: Bedrest  Disposition: Transfer to ICU  Goals of Care:  Last date of multidisciplinary goals of care discussion:3/4, please see Palliative medicine team note written 3/4 Code Status: DNR/DNI  Labs   CBC: Recent Labs  Lab 08/27/20 2114 08/27/20 2142  WBC 12.5*  --   NEUTROABS 8.9*  --   HGB 13.6 14.3  HCT 40.5 42.0  MCV 84.2  --   PLT 198  --     Basic Metabolic Panel: Recent Labs  Lab 08/27/20 2114 08/27/20 2142  NA 139 143  K 3.2* 3.1*  CL 104 104  CO2 23  --   GLUCOSE 90 87  BUN 13 12  CREATININE 1.05* 1.10*  CALCIUM 9.0  --   MG 1.3*  --   PHOS 2.2*  --    GFR: Estimated Creatinine Clearance: 43.1 mL/min (A) (by C-G formula based on SCr of 1.1 mg/dL (H)). Recent Labs  Lab 08/27/20 2114 08/27/20 2255 08/28/20 0328  WBC 12.5*  --   --   LATICACIDVEN  --  2.0* 1.2    Liver Function Tests: Recent Labs  Lab 08/27/20 2114  AST 16  ALT 14  ALKPHOS 119  BILITOT 0.8  PROT 6.1*  ALBUMIN 3.5   No results for input(s): LIPASE, AMYLASE in the last 168 hours. No results for input(s): AMMONIA in the last 168 hours.  ABG    Component Value Date/Time   TCO2 24 08/27/2020 2142     Coagulation Profile: Recent Labs  Lab 08/27/20 2114  INR 1.0    Cardiac Enzymes: No results for input(s): CKTOTAL, CKMB, CKMBINDEX, TROPONINI in the last 168 hours.  HbA1C: Hgb A1c MFr Bld  Date/Time Value Ref Range Status  04/17/2012 10:36 AM 5.7 4.6 - 6.5 % Final    Comment:    Glycemic Control Guidelines for People with Diabetes:Non Diabetic:  <6%Goal of Therapy: <7%Additional Action Suggested:  >8%   01/04/2012 10:49 AM 6.4 4.6 - 6.5 % Final    Comment:    Glycemic Control Guidelines for People with  Diabetes:Non Diabetic:  <6%Goal of Therapy: <7%Additional Action Suggested:  >8%     CBG: Recent Labs  Lab 08/28/20 0846 08/28/20 1135  GLUCAP 83 87    Review of Systems:   Unable to provide ROS  Past Medical History:  She,  has a past medical history of Allergy, Cataract, CLL (12/14/2006), DEGENERATIVE JOINT DISEASE (12/14/2006), EXOGENOUS OBESITY (12/14/2006), FIBROMYALGIA (12/14/2006), Gallstones, Hearing loss of left ear, History of IBS (10/31/2014), HYPERLIPIDEMIA (12/14/2006), HYPERTENSION (12/14/2006), IBS (irritable bowel syndrome), Neurosarcoidosis, Optic neuropathy, right (01/04/2012), Sinusitis nasal (06/24/2016), Sleep apnea, Spinal stenosis (04/17/2012), 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   Surgical History:   Past Surgical History:  Procedure Laterality Date  . BRAIN BIOPSY     [facility]  . CERVICAL FUSION    . CHOLECYSTECTOMY    . COLONOSCOPY    . LUMBAR FUSION  07/25/2012  . TONSILLECTOMY       Social History:   reports that she has never smoked. She has never used smokeless tobacco. She reports that she does not drink alcohol and does not use drugs.   Family History:  Her family history includes Diabetes in her sister; Emphysema in her father; Hypertension in her mother. There is no history of Colon cancer, Esophageal cancer, Rectal cancer, or Stomach cancer.   Allergies Allergies  Allergen Reactions  . Adhesive [Tape] Rash    Redness from tape.   . Latex Anaphylaxis, Rash and Other (See Comments)    Added based on information entered during case entry, please review and add reactions, type, and severity as needed. Patient cannot confirm this allergy.  . Bactrim [Sulfamethoxazole-Trimethoprim]   . Cyclophosphamide Other (See Comments)    Caused diverticulitis.  She would not eat or drink for days, while taking it.  . Sulfa Antibiotics     PER MAR     Home Medications  Prior to Admission medications   Medication Sig Start Date End  Date Taking? Authorizing Provider  ALPRAZolam Duanne Moron) 0.5 MG tablet Take 0.5 mg by mouth 3 (three) times daily. 08/26/20  Yes [provider]  atorvastatin (LIPITOR) 40 MG tablet Take 1 tablet (40 mg total) by mouth daily. Patient taking differently: Take 40 mg by mouth at bedtime. 08/10/16  Yes Marletta Lor, MD  carbidopa-levodopa (SINEMET IR) 25-100 MG tablet Take 1 tablet by mouth 3 (three) times daily.   Yes [provider]  Cholecalciferol (VITAMIN D) 50 MCG (2000 UT) tablet Take 2,000 Units by mouth daily.   Yes [provider]  EPINEPHrine 0.3 mg/0.3 mL IJ SOAJ injection Inject 0.3 mLs (0.3 mg total) into the muscle as needed for up to 1 dose for anaphylaxis. 06/07/19  Yes Burns Spain, MD  FLUoxetine (PROZAC) 40 MG capsule Take 40 mg by mouth daily.   Yes [provider]  fluticasone (FLONASE) 50 MCG/ACT nasal spray Place 1 spray into both nostrils daily. 10/16/12  Yes [provider]  loratadine (CLARITIN) 10 MG tablet Take 10 mg by mouth daily as needed for allergies.   Yes [provider]  traZODone (DESYREL) 50 MG tablet Take 50 mg by mouth at bedtime. 08/03/20  Yes [provider]  vitamin B-12 (CYANOCOBALAMIN) 1000 MCG tablet Take 1 tablet (1,000 mcg total) by mouth daily. Patient not taking: No sig reported 02/06/17   Marletta Lor, MD     Critical care time:    Performed by: Johnsie Cancel  Total critical care time: 40 minutes  Critical care time was exclusive of separately billable procedures and treating other patients.  Critical care was necessary to treat or prevent imminent or life-threatening deterioration.  Critical care was time spent personally by me on the following activities: development of treatment plan with patient and/or surrogate as well as nursing, discussions with consultants, evaluation of patient's response to treatment, examination of patient, obtaining history from patient or  surrogate, ordering and performing treatments and interventions, ordering and review of laboratory studies, ordering and review of radiographic studies, pulse oximetry and re-evaluation of patient's condition.  Johnsie Cancel, NP-C [facility] Personal contact information can be found on Amion  If no response please page: Adult pulmonary and critical care medicine pager on Amion unitl 7pm After 7pm please call [phone] 08/28/2020, 4:57 PM

## 2020-08-28 NOTE — Consult Note (Addendum)
Consultation Note Date: 08/28/2020   Patient Name: Sheryl [name]  DOB: 1945-12-02  MRN: [id number]  Age / Sex: 75 y.o., female  PCP: Marletta Lor, MD (Inactive) Referring Physician: Antonieta Pert, MD  Reason for Consultation: Establishing goals of care  HPI/Patient Profile: 75 y.o. female "Sheryl [name]"  with past medical history of dementia, neurosarcoidosis, schizoaffective disorder, CLL, OSA, DJD, fibromyalgia, and right eye vision loss who was admitted on 08/27/2020 after a behavorial disturbance.  In the ambulance she went into [place] several times.  Later she was reported to have developed Torsades de pointe.  She was treated with magnesium and amiodarone.   PMT was consulted for [place] as she is a DNR.  Clinical Assessment and Goals of Care:  I have reviewed medical records including EPIC notes, labs and imaging, received report from Dr. Maren Beach, examined the patient and talked on the phone with Elliot Gurney and Reyne Dumas to discuss diagnosis prognosis, [place], EOL wishes, disposition and options. Fujie Dickison is both her durable and health care POA.  I introduced Palliative Medicine as specialized medical care for people living with serious illness. It focuses on providing relief from the symptoms and stress of a serious illness.   We discussed a brief life review of the patient.  Ms. Bamburg is normally called "Sheryl [name]" rather than Sheryl [name].  Ms. Kotecki husband died in Sep 20, 2016.  The rest of her family is unfortunately estranged from her with the exception of Sheryl [name] and Mikki Santee.  After her husband died she lived long term at [facility] until late December 2021 when she had 9 falls in two months and [facility] recommended memory care.  She moved to [facility] and has done well until an ER visit on 2/28.  During the ER visit her glasses were lost.  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  Since 2/28 she has  suffered from increased agitation.  Her family believes this is due to the loss of her glasses.  One of her psych medications was increased.  Then the events of last night and this morning took place.  As far as functional and nutritional status she was able stand and pivot.  She was able to take a few steps with a rolling walker if some one was assisting her but she could not walk.  Recently she has forgotten what a spoon is for and has begun to require feeding assistance.  We discussed her current illness and what it means in the larger context of her on-going co-morbidities.  Natural disease trajectory and expectations at EOL were discussed.  I attempted to elicit values and goals of care important to the patient.  The difference between aggressive medical intervention and comfort care was considered in light of the patient's goals of care.   Advanced directives, concepts specific to code status, artifical feeding and hydration, and rehospitalization were considered and discussed.  Sheryl [name] [facility]) was very thoughtful in her answers to my questions.  Her Aunt Sheryl [name] decided to be DNR prior to her mental  status being compromised.  Sheryl [name] and Mikki Santee describe a woman who can not hear out of one hear, she can partially see out of one eye, and she is unable to walk.  Sheryl [name] states her quality of life is not good.  Sheryl [name] describes Sheryl [name]'s anxiety and emotional anguish as severe.   I explained that we may be at a point where her heart is too fragile to tolerate her psych meds that cause QT prolongation.  Sheryl [name] and Mikki Santee understood but felt strongly that her emotional health and quality of life must take priority.  I explained that Cardiology had been consulted and would likely see her soon.  I mentioned it was a possibility that a pacemaker / ICD may be recommended - and asked if they would consider having one placed.  We discussed this for awhile and Sheryl [name] and Mikki Santee agreed again that they would want nothing invasive done.  They do  want non-invasive measures taken to improve her condition.  Mikki Santee and Sheryl [name] described Sheryl [name] as being VERY symptomatic any time she has a UTI.  They also requested that we attempt to find or replace Sheryl [name]'s specialized glasses.  Questions and concerns were addressed.  The family was encouraged to call with questions or concerns.     Primary Decision Maker:  HCPOA  Sheryl [name] [place]    SUMMARY OF RECOMMENDATIONS    No invasive treatments.  DNR/DNI.   No pacemaker or ICD.  Comfort focused treatment Family wants non-invasive measures and treatments used to improve her condition as appropriate. Family asks that we prioritize her comfort over her heart rhythm if we have to choose between psych meds vs heart rhythm. If primary team feels there is any chance she has a UTI please treat. I have left a message with security about the glasses.   May need to refer family to Patient Experience.  Code Status/Advance Care Planning:  DNR/DNI   Symptom Management:   Restarted ativan TID prn anxiety.  There is no documentation that I can find that indicates ativan prolongs QT interval.  Recommend restarting prozac as early as possible even if it is at a lower dose (this medication should not be stopped abruptly).   Additional Recommendations (Limitations, Scope, Preferences):  No Surgical Procedures  Palliative Prophylaxis:   Delirium Protocol  Psycho-social/Spiritual:   Desire for further Chaplaincy support: not discussed.  Prognosis:  Patient is at high risk for decline and death given [place] and Tosades de Pointe rhythms.     Discharge Planning: to be determined.      Primary Diagnoses: Present on Admission: . Hypomagnesemia . Hypokalemia . Hypophosphatemia . Chronic lymphocytic leukemia ([place]) . Dyslipidemia . Essential hypertension . Steroid-induced diabetes ([place])   I have reviewed the medical record, interviewed the patient and family, and examined the patient. The following  aspects are pertinent.  Past Medical History:  Diagnosis Date  . Allergy   . Cataract   . 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 DEGENERATIVE JOINT DISEASE 12/14/2006  . EXOGENOUS OBESITY 12/14/2006  . FIBROMYALGIA 12/14/2006  . Gallstones   . Hearing loss of left ear   . History of IBS 10/31/2014  . HYPERLIPIDEMIA 12/14/2006  . HYPERTENSION 12/14/2006  . IBS (irritable bowel syndrome)   . Neurosarcoidosis   . Optic neuropathy, right 01/04/2012   June 2013   . Sinusitis nasal 06/24/2016  . Sleep apnea    no CPAP  . Spinal stenosis 04/17/2012  . 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 Vision loss of right eye  Social History   Socioeconomic History  . Marital status: Married    Spouse name: Not on file  . Number of children: 0  . Years of education: hs  . Highest education level: Not on file  Occupational History  . Occupation: disabled  . Occupation: clerical  Tobacco Use  . Smoking status: Never Smoker  . Smokeless tobacco: Never Used  Substance and Sexual Activity  . Alcohol use: No    Alcohol/week: 0.0 standard drinks    Comment: One drink per year  . Drug use: No  . Sexual activity: Not on file  Other Topics Concern  . Not on file  Social History Narrative   Married no children 2 caffeinated beverages a day   Social Determinants of Health   Financial Resource Strain: Not on file  Food Insecurity: Not on file  Transportation Needs: Not on file  Physical Activity: Not on file  Stress: Not on file  Social Connections: Not on file   Family History  Problem Relation Age of Onset  . Diabetes Sister   . Hypertension Mother   . Emphysema Father   . Colon cancer Neg Hx   . Esophageal cancer Neg Hx   . Rectal cancer Neg Hx   . Stomach cancer Neg Hx     Allergies  Allergen Reactions  . Adhesive [Tape] Rash    Redness from tape.   . Latex Anaphylaxis, Rash and Other (See Comments)    Added based on information entered during case entry, please review and add reactions, type, and  severity as needed. Patient cannot confirm this allergy.  . Bactrim [Sulfamethoxazole-Trimethoprim]   . Cyclophosphamide Other (See Comments)    Caused diverticulitis.  She would not eat or drink for days, while taking it.  . Sulfa Antibiotics     PER MAR    Vital Signs: BP (!) 187/85   Pulse (!) 56   Temp 97.8 F (36.6 C) (Oral)   Resp (!) 26   SpO2 100%  Pain Scale: 0-10   Pain Score: Asleep   SpO2: SpO2: 100 % O2 Device:SpO2: 100 % O2 Flow Rate: .O2 Flow Rate (L/min): 2 L/min    Palliative Assessment/Data: 20%     Time In: 10:00 Time Out: 11:05 Time Total: 65 min. Visit consisted of counseling and education dealing with the complex and emotionally intense issues surrounding the need for palliative care and symptom management in the setting of serious and potentially life-threatening illness. Greater than 50%  of this time was spent counseling and coordinating care related to the above assessment and plan.  Signed by: Florentina Jenny, PA-C Palliative Medicine  Please contact Palliative Medicine Team phone at [phone] for questions and concerns.  For individual provider: See Shea Evans

## 2020-08-28 NOTE — ED Notes (Addendum)
[facility] was called to transport pt upstairs.

## 2020-08-28 NOTE — Consult Note (Signed)
Consult Note  Patient Name: Sheryl [name] Date of Encounter: 08/28/2020  Primary Cardiologist: None  Subjective   Sheryl [name] is a 75 year old female with PMHx of neurosarcoidosis, schizoaffective disorder, Parkinson's disease, degenerative joint disease, fibromyalgia, hypertension, hyperlipidemia, R optic neuropathy with vision loss admitted with acute encephalopathy. EMS called for altered mental status and noted patient to be bradycardic with HR in 30s. She developed Vtach for which she was given amiodarone en route. Continued to have paroxysmal polymorphic V.tach with intermittent torsades de pointes. She was started on amiodarone gtt and admitted to internal medicine. Cardiology consulted for her torsades de pointes. She is currently in sinus bradycardia. Prolonged QTc on EKG, although slightly improved from presentation. She remains altered although responding appropriately to questions and denies any chest pain, shortness of breath, lightheadedness/dizziness, or palpitations at this time.   Of note, patient is on multiple antipsychotic medications including Xyprexa, Trazodone, Xanax, and Fluoxetine. Following her recent ED admission on 2/28 when patient broke her glasses, she had increasing agitation for which one of her antipsychotic medications was increased.    Inpatient Medications    Scheduled Meds: . enoxaparin (LOVENOX) injection  40 mg Subcutaneous Q24H  . phosphorus  500 mg Oral BID   Continuous Infusions: . amiodarone 30 mg/hr (08/28/20 0729)   PRN Meds: acetaminophen **OR** acetaminophen, prochlorperazine   Vital Signs    Vitals:   08/28/20 1200 08/28/20 1215 08/28/20 1230 08/28/20 1245  BP: (!) 187/85 (!) 191/77 (!) 186/70 (!) 169/73  Pulse: (!) 56 (!) 52 (!) 52 (!) 49  Resp: (!) 26 (!) 24 (!) 21 (!) 21  Temp:      TempSrc:      SpO2: 100% 99% 99% 100%   No intake or output data in the 24 hours ending 08/28/20 1309 There were no vitals filed  for this visit.  Telemetry    Initially noted to have polymorphic ventricular tachycardia with intermittent torsades de pointes, currently sinus brady with HR in 50s - Personally Reviewed  ECG    Sinus bradycardia with prolonged QTc 533; HR 53 - Personally Reviewed  Physical Exam  Physical Exam  Constitutional: Obese chronically ill appearing elderly female. No distress.  HENT: Normocephalic and atraumatic, EOMI, conjunctiva normal, moist mucous membranes Cardiovascular: Bradycardia, regular rhythm, S1 and S2 present, no murmurs, rubs, gallops.  Distal pulses intact Respiratory: No respiratory distress, no accessory muscle use.  Effort is normal.  Lungs are clear to auscultation bilaterally. GI: Nondistended, soft, nontender to palpation, active bowel sounds Musculoskeletal: Normal bulk and tone.  No peripheral edema noted. Neurological: Somnolent but arousable to voice; AOx1 (self), high frequency low amplitude tremor in bilateral upper extremities;  no apparent focal deficits noted. Skin: Warm and dry.  No rash, erythema, lesions noted. Psychiatric: Normal mood and affect. Behavior is normal.   Labs    Chemistry Recent Labs  Lab 08/27/20 2114 08/27/20 2142  NA 139 143  K 3.2* 3.1*  CL 104 104  CO2 23  --   GLUCOSE 90 87  BUN 13 12  CREATININE 1.05* 1.10*  CALCIUM 9.0  --   PROT 6.1*  --   ALBUMIN 3.5  --   AST 16  --   ALT 14  --   ALKPHOS 119  --   BILITOT 0.8  --   GFRNONAA 56*  --   ANIONGAP 12  --      Hematology Recent Labs  Lab 08/27/20 2114 08/27/20 2142  WBC  12.5*  --   RBC 4.81  --   HGB 13.6 14.3  HCT 40.5 42.0  MCV 84.2  --   MCH 28.3  --   MCHC 33.6  --   RDW 13.9  --   PLT 198  --     Cardiac EnzymesNo results for input(s): TROPONINI in the last 168 hours. No results for input(s): TROPIPOC in the last 168 hours.   BNP Recent Labs  Lab 08/27/20 2114  BNP 120.0*     DDimer No results for input(s): DDIMER in the last 168 hours.    Radiology    DG Chest Port 1 View  Result Date: 08/27/2020 CLINICAL DATA:  Dysrhythmia EXAM: PORTABLE CHEST 1 VIEW COMPARISON:  None. FINDINGS: The heart size and mediastinal contours are within normal limits. Aortic knob calcifications are seen. Both lungs are clear. The visualized skeletal structures are unremarkable. Spinal fixation hardware is noted. IMPRESSION: No active disease. Electronically Signed   By: Prudencio Pair M.D.   On: 08/27/2020 21:39    Cardiac Studies   Echo 3/4 > pending   Patient Profile     75 y.o. female with PMHx of schizoaffective disorder, Parkinsons disease, neurosarcoidosis, R optic neuropathy and blindness, hypertension, hyperlipidemia, DJD and fibromyalgia admitted for acute encephalopathy and ventricular arrhythmia.   Assessment & Plan    Ventricular arrhythmia  Prolonged QTc  Patient presented with PMVT with intermittent episode of torsade de pointes for which she was given magnesium and started on amiodarone gtt. Initial EKG with QTc 580ms. She is continued on amiodarone gtt at time of evaluation. Noted to have HR 40-50s. She is otherwise asymptomatic and her troponins are unremarkable. Doubt ischemia driving her PMVT. Likely secondary to prolonged QTc in setting of her antipsychotic medications. Ideally would recommend for pacing and isoproterenol to help with the QTc. Repeat EKG with QTc slightly improved from admission to 588ms.  She did have some hypokalemia, hypophosphatemia and hypomagnesemia when she presented. - Electrolyte repletion - Repeat BMP in afternoon - Discontinuing amiodarone as this can further prolong her QTc - Echo to evaluate for any structural causes of her arrhythmia - Would avoid QTc prolonging medications - Per palliative care discussion with patient's daughter, would not want pacemaker or ICD or any invasive treatments   Acute encephalopathy Hx of schizoaffective disorder Hx of neurosarcoidosis  Unclear regarding initial  concerns as EMS was called for "behavior disorder" per chart review. This could be worsening agitation vs delirium. Of note, one of her antipsychotics was recently increased in setting of agitation. She does have a mild leukocytosis with leukocytes and nitrites on UA. Although no bacteruria and she denies any symptoms at this time. CXR negative. She is currently somnolent but arousable. She is oriented to self and states age as "40", unable to tell the year. She is easily redirectable. No focal deficits on exam.  - Would recommend holding off on QTc prolonging antipsychotics - Delirium precautions - Consider infectious work up if encephalopathy continues   Signed, Harvie Heck, MD Internal Medicine, PGY-2 08/28/2020, 1:09 PM   Pager: [phone]

## 2020-08-28 NOTE — H&P (Signed)
History and Physical    Sheryl [name] ZCH:[id number] DOB: 1946-06-20 DOA: 08/27/2020  PCP: Marletta Lor, MD (Inactive)   Patient coming from: [facility].  I have personally briefly reviewed patient's old medical records in [place]  Chief Complaint: AMS.  HPI: Sheryl [name] is a 75 y.o. female with medical history significant of seasonal allergies, cataract, chronic lymphocytic leukemia, DJD, exogenous obesity, fibromyalgia, gallstone, left ear hearing loss, IBS, hyperlipidemia, hypertension, neurosarcoidosis, right optic neuropathy with right eye vision loss, history of nasal sinusitis, sleep apnea not on CPAP, spinal stenosis, steroid myopathy who is brought to the emergency department after EMS was called initially for a behavioral disturbance, but they found the patient Sheryl [name] bradycardic with a heart rate in the 30s.  EMS then noticed that the patient went into ventricular tachycardia and begun on an amiodarone bolus.  The patient was in and out of V. tach while being transported to the emergency department.  She is unable to provide further history at this time.  ED Course: Initial vital signs were temperature 97.8 F, pulse 67, respirations 21, BP 150/128 mmHg and O2 sat 99% on room nasal cannula oxygen.  The patient magnesium supplementation and was started on an amiodarone infusion.  Labwork: CBC showed a white count of 12.5, hemoglobin 13.6 g/dL and platelets 198.  PT 12.8 INR 1.0.  Troponin was 11 and then 8 ng/L.  BNP was 120.0 pg/mL.  CMP showed a potassium of 3.2 mmol/L.  Creatinine was 1.05 mg/dL with a GFR of 56 mL/min and total protein was 6.1 g/dL.  All other CMP values were normal.  TSH was 2.675 microunits/mL.  Phosphorus was 2.2 and magnesium 1.3 mg/dL.  Lactic acid was 2.0 and then 1.2 mmol/L.  Imaging: A one-view portable chest radiograph did not show any active disease.  Review of Systems: Unable to obtain.  Past Medical History:  Diagnosis  Date  . Allergy   . Cataract   . 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 DEGENERATIVE JOINT DISEASE 12/14/2006  . EXOGENOUS OBESITY 12/14/2006  . FIBROMYALGIA 12/14/2006  . Gallstones   . Hearing loss of left ear   . History of IBS 10/31/2014  . HYPERLIPIDEMIA 12/14/2006  . HYPERTENSION 12/14/2006  . IBS (irritable bowel syndrome)   . Neurosarcoidosis   . Optic neuropathy, right 01/04/2012   June 2013   . Sinusitis nasal 06/24/2016  . Sleep apnea    no CPAP  . Spinal stenosis 04/17/2012  . 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 Vision loss of right eye    Past Surgical History:  Procedure Laterality Date  . BRAIN BIOPSY     [facility]  . CERVICAL FUSION    . CHOLECYSTECTOMY    . COLONOSCOPY    . LUMBAR FUSION  07/25/2012  . TONSILLECTOMY     Social History  reports that she has never smoked. She has never used smokeless tobacco. She reports that she does not drink alcohol and does not use drugs.  Allergies  Allergen Reactions  . Adhesive [Tape] Rash    Redness from tape.   . Latex Anaphylaxis, Rash and Other (See Comments)    Added based on information entered during case entry, please review and add reactions, type, and severity as needed. Patient cannot confirm this allergy.  . Bactrim [Sulfamethoxazole-Trimethoprim]   . Cyclophosphamide Other (See Comments)    Caused diverticulitis.  She would not eat or drink for days, while taking it.  . Sulfa Antibiotics     PER MAR  Family History  Problem Relation Age of Onset  . Diabetes Sister   . Hypertension Mother   . Emphysema Father   . Colon cancer Neg Hx   . Esophageal cancer Neg Hx   . Rectal cancer Neg Hx   . Stomach cancer Neg Hx    Prior to Admission medications   Medication Sig Start Date End Date Taking? Authorizing Provider  acetaminophen (TYLENOL) 325 MG tablet Take 650 mg by mouth every 6 (six) hours as needed for fever.     [provider]  atorvastatin (LIPITOR) 40 MG tablet Take 1 tablet (40 mg total) by mouth  daily. Patient taking differently: Take 40 mg by mouth at bedtime.  08/10/16   Marletta Lor, MD  brimonidine (ALPHAGAN) 0.15 % ophthalmic solution Place 1 drop into the left eye 3 (three) times daily.  09/03/14   [provider]  carbidopa-levodopa (SINEMET IR) 25-100 MG tablet Take 1 tablet by mouth 3 (three) times daily.    [provider]  donepezil (ARICEPT) 10 MG tablet Take 10 mg by mouth at bedtime.    [provider]  doxycycline (VIBRAMYCIN) 100 MG capsule Take 1 capsule (100 mg total) by mouth 2 (two) times daily. One po bid x 7 days 06/07/19   Veryl Speak, MD  EPINEPHrine 0.3 mg/0.3 mL IJ SOAJ injection Inject 0.3 mLs (0.3 mg total) into the muscle as needed for up to 1 dose for anaphylaxis. 06/07/19   Burns Spain, MD  FLUoxetine (PROZAC) 10 MG capsule Take 10 mg by mouth daily.    [provider]  fluticasone (FLONASE) 50 MCG/ACT nasal spray Place 1 spray into both nostrils daily as needed for allergies.  10/16/12   [provider]  loratadine (CLARITIN) 10 MG tablet Take 10 mg by mouth daily as needed for allergies.    [provider]  nystatin cream (MYCOSTATIN) Apply 1 application topically as needed for dry skin.    [provider]  OLANZapine (ZYPREXA) 7.5 MG tablet Take 7.5 mg by mouth at bedtime.     [provider]  pantoprazole (PROTONIX) 40 MG tablet Take 40 mg by mouth daily.    [provider]  polyethylene glycol (MIRALAX / GLYCOLAX) packet Take 17 g by mouth daily as needed for mild constipation.     [provider]  UNABLE TO FIND Take 120 mLs by mouth 2 (two) times daily. Med Name: MedPass    [provider]  vitamin B-12 (CYANOCOBALAMIN) 1000 MCG tablet Take 1 tablet (1,000 mcg total) by mouth daily. Patient not taking: Reported on 04/15/2017 02/06/17   Marletta Lor, MD   Physical Exam: Vitals:   08/27/20 2350 08/28/20 0030 08/28/20 0112 08/28/20 0145  BP:  110/64 (!) 150/62 136/80 131/67  Pulse: 88 72 (!) 57 62  Resp: 19 18 13  (!) 22  Temp:      TempSrc:      SpO2: 100% 100% 99% 99%   Constitutional: Looks chronically ill. Eyes: PERRL, lids and conjunctivae mildly injected. ENMT: Mucous membranes are dry.  Posterior pharynx clear of any exudate or lesions. Neck: normal, supple, no masses, no thyromegaly Respiratory: Poor inspiratory effort. Decreased breath sounds on bases, otherwise clear to auscultation bilaterally, no wheezing, no crackles.  No accessory muscle use.  Cardiovascular: Regular rate and rhythm 66 bpm, no murmurs / rubs / gallops. No extremity edema. 2+ pedal pulses. No carotid bruits.  Abdomen: Obese, no distention.  Bowel sounds positive.  Soft,  no tenderness, no masses palpated. No hepatosplenomegaly. Musculoskeletal: Generalized weakness.  No clubbing / cyanosis.  Good ROM, no contractures. Normal muscle tone.  Skin: no rashes, lesions, ulcers on very limited dermatological examination. Neurologic: Grossly nonfocal. Psychiatric: Somnolent, in NAD.Marland Kitchen   Labs on Admission: I have personally reviewed following labs and imaging studies  CBC: Recent Labs  Lab 08/27/20 2114 08/27/20 2142  WBC 12.5*  --   NEUTROABS 8.9*  --   HGB 13.6 14.3  HCT 40.5 42.0  MCV 84.2  --   PLT 198  --     Basic Metabolic Panel: Recent Labs  Lab 08/27/20 2114 08/27/20 2142  NA 139 143  K 3.2* 3.1*  CL 104 104  CO2 23  --   GLUCOSE 90 87  BUN 13 12  CREATININE 1.05* 1.10*  CALCIUM 9.0  --   MG 1.3*  --   PHOS 2.2*  --     GFR: Estimated Creatinine Clearance: 43.1 mL/min (A) (by C-G formula based on SCr of 1.1 mg/dL (H)).  Liver Function Tests: Recent Labs  Lab 08/27/20 2114  AST 16  ALT 14  ALKPHOS 119  BILITOT 0.8  PROT 6.1*  ALBUMIN 3.5   Radiological Exams on Admission: DG Chest Port 1 View  Result Date: 08/27/2020 CLINICAL DATA:  Dysrhythmia EXAM: PORTABLE CHEST 1 VIEW COMPARISON:  None. FINDINGS: The heart  size and mediastinal contours are within normal limits. Aortic knob calcifications are seen. Both lungs are clear. The visualized skeletal structures are unremarkable. Spinal fixation hardware is noted. IMPRESSION: No active disease. Electronically Signed   By: Prudencio Pair M.D.   On: 08/27/2020 21:39    EKG: Independently reviewed.  Vent. rate 76 BPM PR interval * ms QRS duration 104 ms QT/QTc 570/546 ms P-R-T axes 67 -10 28 Sinus rhythm Ventricular bigeminy Low voltage, precordial leads Abnormal R-wave progression, early transition Prolonged QT interval  Assessment/Plan Principal Problem:   Torsades de pointes (HCC) With episodes of bradycardia earlier. Observation/progressive unit. Continue magnesium supplementation. Continue potassium replacement. Consult cardiology this morning. Hold QT prolonging meds.  Active Problems:   Hypomagnesemia Replacing. Follow-up magnesium level.    Hypokalemia Replenishing. Follow-up potassium level.    Hypophosphatemia K-Phos 500 mg p.o. twice daily x2 doses.    Chronic lymphocytic leukemia (HCC) Mild leukocytosis. Normal differential. Monitor CBC. Follow-up with hematology as an outpatient.    Dyslipidemia Continue statin pending med rec.    Essential hypertension Needs med rec. Monitor blood pressure.    Steroid-induced diabetes (HCC) Carbohydrate modified diet. CBG monitoring with RI SS.    DVT prophylaxis: Lovenox SQ. Code Status:   DNR. Family Communication: Disposition Plan:   Patient is from:  SNF.  Anticipated DC to:  SNF  Anticipated DC date:  08/29/2020.  Anticipated DC barriers: Clinical status and consultant sign off.  Consults called:        Cardiology on-call. Admission status:  Progressive/observation.   Severity of Illness: Very high due to presenting with polymorphic V. tach after having an episode of bradycardia.  The patient will need to remain for amiodarone infusion and electrolyte  replacement.  Reubin Milan MD [facility]  How to contact the [facility] Attending or Consulting provider [place] or covering provider during after hours [place], for this patient?   1. Check the care team in [facility] and look for a) attending/consulting [facility] provider listed and b) the [facility] team listed 2. Log into [email or web address] and use [place]'s universal password  to access. If you do not have the password, please contact the hospital operator. 3. Locate the [facility] provider you are looking for under [facility] and page to a number that you can be directly reached. 4. If you still have difficulty reaching the provider, please page the [facility] (Director on Call) for the Hospitalists listed on amion for assistance.  08/28/2020, 2:35 AM   This document was prepared using Dragon voice recognition software and may contain some unintended transcription errors.

## 2020-08-28 NOTE — Progress Notes (Signed)
Patient seen and examined personally, I reviewed the chart, history and physical and admission note, done by admitting physician this morning and agree with the same with following addendum.  Please refer to the morning admission note for more detailed plan of care.  Briefly,  75 year old female with seasonal allergies cataract, chronic lymphocytic leukemia, DJD, exogenous obesity, fibromyalgia, gallstones left ear hearing loss, IBS, hyperlipidemia, hypertension, neurosarcoidosis, right optic neuropathy with right eye vision loss, history of sleep apnea not on CPAP, spinal stenosis, steroid myopathy long-term resident of [facility] brought to the ED with V Tach.  As per report:EMS was called initially for a behavioral disturbance, but they found the patient Pale and bradycardic with a heart rate in the 30s.  EMS then noticed that the patient went into ventricular tachycardia and begun on an amiodarone bolus. The patient was in and out of V. tach while being transported to the ED. She is unable to provide further history at this time. ED Course: Initial vital signs were temperature 97.8 F, pulse 67, respirations 21, BP 150/128 mmHg and O2 sat 99% on room nasal cannula oxygen. The patient magnesium supplementation and was started on an amiodarone infusion. Labwork: CBC showed a white count of 12.5, hemoglobin 13.6 g/dL and platelets 198.  PT 12.8 INR 1.0.  Troponin was 11 and then 8 ng/L.  BNP was 120.0 pg/mL.  CMP showed a potassium of 3.2 mmol/L.  Creatinine was 1.05 mg/dL with a GFR of 56 mL/min and total protein was 6.1 g/dL.  All other CMP values were normal.  TSH was 2.675 microunits/mL. Phosphorus was 2.2 and magnesium 1.3 mg/dL.  Lactic acid was 2.0 and then 1.2 mmol/L. Chest x-ray no acute finding  Patient was admitted on amiodarone infusion. In ED On exam she is sedated difficult to arouse but when she woke up able to tell me her name Tryniti goes back to sleep not in distress irregular  breathing at times and heart rate in 40s She is DNR proBNP 120, troponin 11>8 Lactic acid 2>1.2  Torsades de pointes V. Tach Episodes of bradycardia Prolonged QTC Cardiology consulted continue on telemetry continue amiodarone.  Patient is DNR palliative care involved continue medical management at risk of decompensation.  Unfortunately she is on multiple medication which can prolong QTC family would like her to have the essential medication understanding the fact thet she may die from VT. At the facility she is on Xanax, Sinemet,Prozac, trazodone. Await cards input.  Hypokalemia/hypomagnesemia/hypophosphatemia: Was replaced will repeat in a.m. Uncontrolled hypertension-add prn hydraalzine CLL Dyslipidemia Leukocytosis Steroid-induced diabetes  Goals of care: Patient is DNR.  Palliative care has been consulted appreciate input patient has risk of decompensation and death of his family understands continue to treat and manage medically, cardiology is on consult

## 2020-08-28 NOTE — Progress Notes (Signed)
Notified by [facility] nursing that the patient continues to have runs of PMVT. I have recommended isoproterenol drip to maintain HR >100 bpm. QTC has come down to 530 ms. We have repeat electrolytes pending. Amiodarone, as mentioned in my consult note, is not the treatment of choice as this will prolong her QTc. Nursing has informed me that this drip cannot be done on the floor. She will need to be transferred to the ICU. This will be temporary.   [place] T. Audie Box, MD, [place]  [address], [place], [place] [address] [phone]  3:47 PM

## 2020-08-28 NOTE — ED Provider Notes (Signed)
Discussed with Dr. Earlie Server of critical care.  He states patient can be placed on the progressive unit on amiodarone drip.  Replace electrolytes including potassium and magnesium.  Patient is DNR and not a candidate for intubation or vasopressors.  Discussed with Dr. Olevia Bowens who accepts patient.   Ezequiel Essex, MD 08/28/20 [phone]

## 2020-08-28 NOTE — ED Notes (Signed)
Cardiology at bedside.

## 2020-08-28 NOTE — ED Notes (Signed)
Lunch Tray Ordered @ 1034. 

## 2020-08-28 NOTE — Plan of Care (Signed)
Initiate Care Plan Problem: Safety: Goal: Non-violent Restraint(s) Outcome: Progressing   Problem: Education: Goal: Knowledge of General Education information will improve Description: Including pain rating scale, medication(s)/side effects and non-pharmacologic comfort measures Outcome: Progressing   Problem: Health Behavior/Discharge Planning: Goal: Ability to manage health-related needs will improve Outcome: Progressing   Problem: Clinical Measurements: Goal: Ability to maintain clinical measurements within normal limits will improve Outcome: Progressing Goal: Will remain free from infection Outcome: Progressing Goal: Diagnostic test results will improve Outcome: Progressing Goal: Respiratory complications will improve Outcome: Progressing Goal: Cardiovascular complication will be avoided Outcome: Progressing   Problem: Activity: Goal: Risk for activity intolerance will decrease Outcome: Progressing   Problem: Nutrition: Goal: Adequate nutrition will be maintained Outcome: Progressing   Problem: Coping: Goal: Level of anxiety will decrease Outcome: Progressing   Problem: Elimination: Goal: Will not experience complications related to bowel motility Outcome: Progressing Goal: Will not experience complications related to urinary retention Outcome: Progressing   Problem: Pain Managment: Goal: General experience of comfort will improve Outcome: Progressing   Problem: Safety: Goal: Ability to remain free from injury will improve Outcome: Progressing   Problem: Safety: Goal: Ability to remain free from injury will improve Outcome: Progressing   Problem: Skin Integrity: Goal: Risk for impaired skin integrity will decrease Outcome: Progressing   Problem: Education: Goal: Ability to manage disease process will improve Outcome: Progressing Goal: Individualized Educational Video(s) Outcome: Progressing   Problem: Cardiac: Goal: Ability to achieve and  maintain adequate cardiopulmonary perfusion will improve Outcome: Progressing

## 2020-08-29 DIAGNOSIS — I472 Ventricular tachycardia, unspecified: Secondary | ICD-10-CM

## 2020-08-29 DIAGNOSIS — Z8249 Family history of ischemic heart disease and other diseases of the circulatory system: Secondary | ICD-10-CM | POA: Diagnosis not present

## 2020-08-29 DIAGNOSIS — Z66 Do not resuscitate: Secondary | ICD-10-CM | POA: Diagnosis present

## 2020-08-29 DIAGNOSIS — C9110 Chronic lymphocytic leukemia of B-cell type not having achieved remission: Secondary | ICD-10-CM | POA: Diagnosis present

## 2020-08-29 DIAGNOSIS — G20 Parkinson's disease: Secondary | ICD-10-CM | POA: Diagnosis present

## 2020-08-29 DIAGNOSIS — I10 Essential (primary) hypertension: Secondary | ICD-10-CM | POA: Diagnosis present

## 2020-08-29 DIAGNOSIS — E785 Hyperlipidemia, unspecified: Secondary | ICD-10-CM | POA: Diagnosis present

## 2020-08-29 DIAGNOSIS — E6609 Other obesity due to excess calories: Secondary | ICD-10-CM | POA: Diagnosis present

## 2020-08-29 DIAGNOSIS — Z515 Encounter for palliative care: Secondary | ICD-10-CM | POA: Diagnosis not present

## 2020-08-29 DIAGNOSIS — Z20822 Contact with and (suspected) exposure to covid-19: Secondary | ICD-10-CM | POA: Diagnosis present

## 2020-08-29 DIAGNOSIS — G4730 Sleep apnea, unspecified: Secondary | ICD-10-CM | POA: Diagnosis present

## 2020-08-29 DIAGNOSIS — F0390 Unspecified dementia without behavioral disturbance: Secondary | ICD-10-CM | POA: Diagnosis not present

## 2020-08-29 DIAGNOSIS — T380X5A Adverse effect of glucocorticoids and synthetic analogues, initial encounter: Secondary | ICD-10-CM | POA: Diagnosis present

## 2020-08-29 DIAGNOSIS — Z981 Arthrodesis status: Secondary | ICD-10-CM | POA: Diagnosis not present

## 2020-08-29 DIAGNOSIS — Z79899 Other long term (current) drug therapy: Secondary | ICD-10-CM | POA: Diagnosis not present

## 2020-08-29 DIAGNOSIS — F259 Schizoaffective disorder, unspecified: Secondary | ICD-10-CM | POA: Diagnosis present

## 2020-08-29 DIAGNOSIS — I4581 Long QT syndrome: Secondary | ICD-10-CM | POA: Diagnosis not present

## 2020-08-29 DIAGNOSIS — H5461 Unqualified visual loss, right eye, normal vision left eye: Secondary | ICD-10-CM | POA: Diagnosis present

## 2020-08-29 DIAGNOSIS — M797 Fibromyalgia: Secondary | ICD-10-CM | POA: Diagnosis present

## 2020-08-29 DIAGNOSIS — F0280 Dementia in other diseases classified elsewhere without behavioral disturbance: Secondary | ICD-10-CM | POA: Diagnosis present

## 2020-08-29 DIAGNOSIS — E876 Hypokalemia: Secondary | ICD-10-CM | POA: Diagnosis present

## 2020-08-29 DIAGNOSIS — E099 Drug or chemical induced diabetes mellitus without complications: Secondary | ICD-10-CM | POA: Diagnosis present

## 2020-08-29 DIAGNOSIS — Z6829 Body mass index (BMI) 29.0-29.9, adult: Secondary | ICD-10-CM | POA: Diagnosis not present

## 2020-08-29 DIAGNOSIS — I462 Cardiac arrest due to underlying cardiac condition: Secondary | ICD-10-CM | POA: Diagnosis present

## 2020-08-29 DIAGNOSIS — H468 Other optic neuritis: Secondary | ICD-10-CM | POA: Diagnosis present

## 2020-08-29 LAB — PHOSPHORUS: Phosphorus: 4.6 mg/dL (ref 2.5–4.6)

## 2020-08-29 LAB — CBC
HCT: 35.9 % — ABNORMAL LOW (ref 36.0–46.0)
Hemoglobin: 12 g/dL (ref 12.0–15.0)
MCH: 28.6 pg (ref 26.0–34.0)
MCHC: 33.4 g/dL (ref 30.0–36.0)
MCV: 85.5 fL (ref 80.0–100.0)
Platelets: 112 10*3/uL — ABNORMAL LOW (ref 150–400)
RBC: 4.2 MIL/uL (ref 3.87–5.11)
RDW: 14.7 % (ref 11.5–15.5)
WBC: 8.2 10*3/uL (ref 4.0–10.5)
nRBC: 0 % (ref 0.0–0.2)

## 2020-08-29 LAB — GLUCOSE, CAPILLARY
Glucose-Capillary: 118 mg/dL — ABNORMAL HIGH (ref 70–99)
Glucose-Capillary: 123 mg/dL — ABNORMAL HIGH (ref 70–99)
Glucose-Capillary: 125 mg/dL — ABNORMAL HIGH (ref 70–99)
Glucose-Capillary: 126 mg/dL — ABNORMAL HIGH (ref 70–99)
Glucose-Capillary: 110 mg/dL — ABNORMAL HIGH (ref 70–99)
Glucose-Capillary: 103 mg/dL — ABNORMAL HIGH (ref 70–99)
Glucose-Capillary: 94 mg/dL (ref 70–99)
Glucose-Capillary: 113 mg/dL — ABNORMAL HIGH (ref 70–99)

## 2020-08-29 LAB — BASIC METABOLIC PANEL
Anion gap: 12 (ref 5–15)
BUN: 9 mg/dL (ref 8–23)
CO2: 20 mmol/L — ABNORMAL LOW (ref 22–32)
Calcium: 8.3 mg/dL — ABNORMAL LOW (ref 8.9–10.3)
Chloride: 107 mmol/L (ref 98–111)
Creatinine, Ser: 0.96 mg/dL (ref 0.44–1.00)
GFR, Estimated: >60 mL/min (ref >60)
Glucose, Bld: 128 mg/dL — ABNORMAL HIGH (ref 70–99)
Potassium: 3.7 mmol/L (ref 3.5–5.1)
Sodium: 139 mmol/L (ref 135–145)

## 2020-08-29 LAB — MRSA PCR SCREENING: MRSA by PCR: NEGATIVE

## 2020-08-29 LAB — MAGNESIUM: Magnesium: 2.3 mg/dL (ref 1.7–2.4)

## 2020-08-29 LAB — HEMOGLOBIN A1C
Hgb A1c MFr Bld: 5.5 % (ref 4.8–5.6)
Mean Plasma Glucose: 111.15 mg/dL

## 2020-08-29 MED ORDER — MAGNESIUM OXIDE 400 (241.3 MG) MG PO TABS
400.0000 mg | ORAL_TABLET | Freq: Every day | ORAL | Status: DC
Start: 2020-08-29 — End: 2020-08-31
  Administered 2020-08-29: 400 mg via ORAL
  Filled 2020-08-29: qty 1

## 2020-08-29 MED ORDER — CARBIDOPA-LEVODOPA 25-100 MG PO TABS
1.0000 | ORAL_TABLET | Freq: Three times a day (TID) | ORAL | Status: DC
  Administered 2020-08-29 – 2020-09-01 (×6): 1 via ORAL
  Filled 2020-08-29 (×14): qty 1

## 2020-08-29 MED ORDER — ALPRAZOLAM 0.5 MG PO TABS
0.5000 mg | ORAL_TABLET | Freq: Three times a day (TID) | ORAL | Status: DC
  Administered 2020-08-29 (×2): 0.5 mg via ORAL
  Filled 2020-08-29 (×3): qty 1

## 2020-08-29 MED ORDER — LORAZEPAM 2 MG/ML IJ SOLN: 1.0000 mg | INTRAMUSCULAR | Status: DC | PRN

## 2020-08-29 MED ORDER — MAGNESIUM SULFATE 2 GM/50ML IV SOLN
2.0000 g | Freq: Once | INTRAVENOUS | Status: AC
  Administered 2020-08-29: 2 g via INTRAVENOUS
  Filled 2020-08-29: qty 50

## 2020-08-29 MED ORDER — FLUTICASONE PROPIONATE 50 MCG/ACT NA SUSP
1.0000 | Freq: Every day | NASAL | Status: DC
  Administered 2020-09-01: 1 via NASAL
  Filled 2020-08-29: qty 16

## 2020-08-29 MED ORDER — FLUOXETINE HCL 20 MG PO CAPS
40.0000 mg | ORAL_CAPSULE | Freq: Every day | ORAL | Status: DC
  Administered 2020-08-29: 40 mg via ORAL
  Filled 2020-08-29: qty 2

## 2020-08-29 MED ORDER — MAGNESIUM OXIDE 400 (241.3 MG) MG PO TABS: 400.0000 mg | ORAL_TABLET | Freq: Every day | ORAL | Status: DC

## 2020-08-29 MED ORDER — LORAZEPAM 0.5 MG PO TABS
0.5000 mg | ORAL_TABLET | Freq: Three times a day (TID) | ORAL | Status: DC
  Administered 2020-08-29: 0.5 mg via ORAL
  Filled 2020-08-29: qty 1

## 2020-08-29 MED ORDER — TRAZODONE HCL 50 MG PO TABS
50.0000 mg | ORAL_TABLET | Freq: Every day | ORAL | Status: DC
  Administered 2020-08-29: 50 mg via ORAL
  Filled 2020-08-29: qty 1

## 2020-08-29 MED ORDER — LORAZEPAM 2 MG/ML IJ SOLN
1.0000 mg | INTRAMUSCULAR | Status: DC | PRN
  Administered 2020-08-29: 1 mg via INTRAVENOUS
  Administered 2020-08-29 – 2020-08-30 (×4): 2 mg via INTRAVENOUS
  Administered 2020-08-30: 1 mg via INTRAVENOUS
  Filled 2020-08-29 (×6): qty 1

## 2020-08-29 MED ORDER — QUETIAPINE FUMARATE 25 MG PO TABS: 25.0000 mg | ORAL_TABLET | Freq: Every day | ORAL | Status: DC

## 2020-08-29 MED ORDER — VITAMIN D 25 MCG (1000 UNIT) PO TABS
2000.0000 [IU] | ORAL_TABLET | Freq: Every day | ORAL | Status: DC
  Administered 2020-08-29: 2000 [IU] via ORAL
  Filled 2020-08-29: qty 2

## 2020-08-29 NOTE — Progress Notes (Signed)
Daily Progress Note   Patient Name: Sheryl [name]       Date: 08/29/2020 DOB: May 31, 1946  Age: 75 y.o. MRN#: [id number] Attending Physician: Kipp Brood, MD Primary Care Physician: Marletta Lor, MD (Inactive) Admit Date: 08/27/2020  Reason for Consultation/Follow-up: To discuss complex medical decision making related to patient's goals of care  Subjective: Visited patient at bedside.  She responds to my speaking her name "Sheryl [name]".  She smiles when I mention Sheryl [name]'s name.  She denies pain or discomfort.  I ask if she needs anything "not right now" she responds slowly.  She is pleasant but somewhat agitated with the lines attached to her and the constant beeping of the ICU.    I wonder if this is her baseline as she suffers with dementia.     She has an attentive RN who tells me at times Sheryl [name] has had episodes of significant tremor and at times she has tried to get out of bed.  I left a voice mail for her niece (HCPOA) Sheryl [name] and asked her to return my call.   Assessment: Patient awake and alert.  Confused.  Responds slowly.  Mildly agitated.  Multiple episodes of torsades de pointe per Cards   Patient Profile/HPI:  75 y.o.female "Sheryl [name]"with past medical history of dementia, neurosarcoidosis, schizoaffective disorder, CLL, OSA, DJD, fibromyalgia, and right eye vision losswho was admitted on 3/3/2022after a behavorial disturbance.  In the ambulance she went into [place] several times.  Later she was reported to have developed Torsades de pointe.  She was treated with magnesium and amiodarone.   PMT was consulted for [place] as she is a DNR.    Length of Stay: 0   Vital Signs: BP (!) 113/37   Pulse 86   Temp 99.2 F (37.3 C)   Resp (!) 27   SpO2 95%  SpO2: SpO2: 95 % O2  Device: O2 Device: Nasal Cannula O2 Flow Rate: O2 Flow Rate (L/min): 3 L/min       Palliative Assessment/Data: 30%     Palliative Care Plan    Recommendations/Plan:  Increased PRN ativan 1 mg for moderate anxiety, 2 mg for severe anxiety.  Requested that pharmacy verify PTA meds and dosages - I don't see Zyprexa on her med list.  Was Prozac the medication that was recently increased?  PMT will continue to follow with you.  FAMILY FAVORS COMFORT OVER LIFE PROLONGING MEASURES.   Hopefully we will be able to adjust her psych meds in order to eliminate arrhythmias, but if we can not family favors comfort care and continuance of psych meds.  Code Status:  DNR  Prognosis:  Patient is currently at high risk for death from a fatal heart arrhythmia.  Discharge Planning:  To Be Determined  Care plan was discussed with RN.  Attempted to call Sheryl [name] and requested a call back.  Will communicate with Dr. Lynetta Mare.  Thank you for allowing the Palliative Medicine Team to assist in the care of this patient.  Total time spent:  35 min.     Greater than 50%  of this time was spent counseling and coordinating care related to the above assessment and plan.  Florentina Jenny, PA-C Palliative Medicine  Please contact Palliative MedicineTeam phone at [phone] for questions and concerns between 7 am - 7 pm.   Please see AMION for individual provider pager numbers.

## 2020-08-29 NOTE — Progress Notes (Signed)
Progress Note  Patient Name: Sheryl [name] Date of Encounter: 08/29/2020  [facility] Cardiologist: No primary care provider on file.   Subjective   Alert, but not very communicative. Continues to have brief nonsustained polymorphic VT fairly frequently, including one 15 second event this morning. All have long-short sequence initiation.  No ECG this morning. QT remains >500 ms on tele strip. K 3.7.  Inpatient Medications    Scheduled Meds: . Chlorhexidine Gluconate Cloth  6 each Topical Daily  . enoxaparin (LOVENOX) injection  40 mg Subcutaneous Q24H   Continuous Infusions: . isoproterenol (ISUPREL) infusion 3 mcg/min (08/29/20 0600)   PRN Meds: acetaminophen **OR** acetaminophen, hydrALAZINE, LORazepam, prochlorperazine   Vital Signs    Vitals:   08/29/20 0414 08/29/20 0500 08/29/20 0600 08/29/20 0700  BP:  (!) 122/47 (!) 113/37   Pulse:  80 86   Resp:  20 (!) 27   Temp: 98.2 F (36.8 C)   99.2 F (37.3 C)  TempSrc: Axillary     SpO2:  100% 95%     Intake/Output Summary (Last 24 hours) at 08/29/2020 1007 Last data filed at 08/29/2020 0600 Gross per 24 hour  Intake 787.14 ml  Output 300 ml  Net 487.14 ml   Last 3 Weights 08/24/2020 06/02/2020 06/06/2019  Weight (lbs) 160 lb 190 lb 0.6 oz 190 lb  Weight (kg) 72.576 kg 86.2 kg 86.183 kg      Telemetry    SR with multiple episodes of brief torsades de pointes, longest 15" - Personally Reviewed  ECG    No new tracing - Personally Reviewed  Physical Exam  Calm, comfortable GEN: No acute distress.   Neck: No JVD Cardiac: RRR, no murmurs, rubs, or gallops.  Respiratory: Clear to auscultation bilaterally. GI: Soft, nontender, non-distended  MS: No edema; No deformity. Neuro:  Nonfocal  Psych: withdrawn   Labs    High Sensitivity Troponin:   Recent Labs  Lab 08/27/20 2114 08/28/20 0328  TROPONINIHS 11 8      Chemistry Recent Labs  Lab 08/27/20 2114 08/27/20 2142 08/28/20 1551  08/29/20 0123  NA 139 143 143 139  K 3.2* 3.1* 4.2 3.7  CL 104 104 108 107  CO2 23  --  24 20*  GLUCOSE 90 87 96 128*  BUN 13 12 10 9   CREATININE 1.05* 1.10* 1.01* 0.96  CALCIUM 9.0  --  8.7* 8.3*  PROT 6.1*  --   --   --   ALBUMIN 3.5  --   --   --   AST 16  --   --   --   ALT 14  --   --   --   ALKPHOS 119  --   --   --   BILITOT 0.8  --   --   --   GFRNONAA 56*  --  59* >60  ANIONGAP 12  --  11 12     Hematology Recent Labs  Lab 08/27/20 2114 08/27/20 2142 08/29/20 0123  WBC 12.5*  --  8.2  RBC 4.81  --  4.20  HGB 13.6 14.3 12.0  HCT 40.5 42.0 35.9*  MCV 84.2  --  85.5  MCH 28.3  --  28.6  MCHC 33.6  --  33.4  RDW 13.9  --  14.7  PLT 198  --  112*    BNP Recent Labs  Lab 08/27/20 2114  BNP 120.0*     DDimer No results for input(s): DDIMER in the last  168 hours.   Radiology    DG Chest Port 1 View  Result Date: 08/27/2020 CLINICAL DATA:  Dysrhythmia EXAM: PORTABLE CHEST 1 VIEW COMPARISON:  None. FINDINGS: The heart size and mediastinal contours are within normal limits. Aortic knob calcifications are seen. Both lungs are clear. The visualized skeletal structures are unremarkable. Spinal fixation hardware is noted. IMPRESSION: No active disease. Electronically Signed   By: Prudencio Pair M.D.   On: 08/27/2020 21:39   ECHOCARDIOGRAM COMPLETE  Result Date: 08/28/2020    ECHOCARDIOGRAM REPORT   Patient Name:   Sheryl [name] Date of Exam: 08/28/2020 Medical Rec #:  [id number]          Height:       62.0 in Accession #:    [id number]         Weight:       160.0 lb Date of Birth:  [birth year]           BSA:          1.739 m Patient Age:    75 years           BP:           191/78 mmHg Patient Gender: F                  HR:           50 bpm. Exam Location:  Inpatient Procedure: 2D Echo, 3D Echo, Cardiac Doppler, Color Doppler and Strain Analysis Indications:    VT  History:        Patient has no prior history of Echocardiogram examinations.                 Risk  Factors:Hypertension and Dyslipidemia.  Sonographer:    Luisa Hart RDCS Referring Phys: [id number] [place] IMPRESSIONS  1. Left ventricular ejection fraction, by estimation, is 60 to 65%. The left ventricle has normal function. The left ventricle has no regional wall motion abnormalities. Left ventricular diastolic parameters are consistent with Grade I diastolic dysfunction (impaired relaxation). Elevated left ventricular end-diastolic pressure. The E/e' is 7.  2. Right ventricular systolic function is hyperdynamic. The right ventricular size is normal.  3. The mitral valve is abnormal. Trivial mitral valve regurgitation. Moderate to severe mitral annular calcification.  4. The aortic valve is tricuspid. Aortic valve regurgitation is not visualized. Mild to moderate aortic valve sclerosis/calcification is present, without any evidence of aortic stenosis. Aortic valve mean gradient measures 4.0 mmHg. Aortic valve Vmax measures 1.39 m/s.  5. The inferior vena cava is normal in size with <50% respiratory variability, suggesting right atrial pressure of 8 mmHg. FINDINGS  Left Ventricle: Left ventricular ejection fraction, by estimation, is 60 to 65%. The left ventricle has normal function. The left ventricle has no regional wall motion abnormalities. The left ventricular internal cavity size was normal in size. There is  no left ventricular hypertrophy. Left ventricular diastolic parameters are consistent with Grade I diastolic dysfunction (impaired relaxation). Elevated left ventricular end-diastolic pressure. The E/e' is 7. Right Ventricle: The right ventricular size is normal. No increase in right ventricular wall thickness. Right ventricular systolic function is hyperdynamic. Left Atrium: Left atrial size was normal in size. Right Atrium: Right atrial size was normal in size. Pericardium: There is no evidence of pericardial effusion. Mitral Valve: The mitral valve is abnormal. Moderate to severe mitral annular  calcification. Trivial mitral valve regurgitation. MV peak gradient, 8.9 mmHg. The mean mitral valve gradient is 2.0 mmHg. Tricuspid  Valve: The tricuspid valve is grossly normal. Tricuspid valve regurgitation is trivial. Aortic Valve: The aortic valve is tricuspid. Aortic valve regurgitation is not visualized. Mild to moderate aortic valve sclerosis/calcification is present, without any evidence of aortic stenosis. Aortic valve mean gradient measures 4.0 mmHg. Aortic valve peak gradient measures 7.7 mmHg. Aortic valve area, by VTI measures 1.61 cm. Pulmonic Valve: The pulmonic valve was grossly normal. Pulmonic valve regurgitation is trivial. Aorta: The aortic root and ascending aorta are structurally normal, with no evidence of dilitation. Venous: The inferior vena cava is normal in size with less than 50% respiratory variability, suggesting right atrial pressure of 8 mmHg. IAS/Shunts: No atrial level shunt detected by color flow Doppler.  LEFT VENTRICLE PLAX 2D LVIDd:         4.60 cm  Diastology LVIDs:         2.30 cm  LV e' medial:    4.68 cm/s LV PW:         0.90 cm  LV E/e' medial:  24.4 LV IVS:        1.10 cm  LV e' lateral:   5.77 cm/s LVOT diam:     1.60 cm  LV E/e' lateral: 19.8 LV SV:         59 LV SV Index:   34 LVOT Area:     2.01 cm  RIGHT VENTRICLE RV S prime:     13.90 cm/s TAPSE (M-mode): 4.0 cm LEFT ATRIUM           Index       RIGHT ATRIUM          Index LA Vol (A4C): 32.4 ml 18.63 ml/m RA Area:     8.88 cm                                   RA Volume:   18.40 ml 10.58 ml/m  AORTIC VALVE                   PULMONIC VALVE AV Area (Vmax):    1.62 cm    PV Vmax:       0.98 m/s AV Area (Vmean):   1.62 cm    PV Vmean:      63.400 cm/s AV Area (VTI):     1.61 cm    PV VTI:        0.229 m AV Vmax:           139.00 cm/s PV Peak grad:  3.8 mmHg AV Vmean:          90.200 cm/s PV Mean grad:  2.0 mmHg AV VTI:            0.367 m AV Peak Grad:      7.7 mmHg AV Mean Grad:      4.0 mmHg LVOT Vmax:          112.00 cm/s LVOT Vmean:        72.600 cm/s LVOT VTI:          0.293 m LVOT/AV VTI ratio: 0.80  AORTA Ao Root diam: 2.70 cm Ao Asc diam:  3.00 cm MITRAL VALVE MV Area (PHT): 3.03 cm     SHUNTS MV Area VTI:   1.26 cm     Systemic VTI:  0.29 m MV Peak grad:  8.9 mmHg     Systemic Diam: 1.60 cm MV Mean grad:  2.0 mmHg  MV Vmax:       1.49 m/s MV Vmean:      65.8 cm/s MV Decel Time: 250 msec MR Peak grad: 136.0 mmHg MR Mean grad: 94.0 mmHg MR Vmax:      583.00 cm/s MR Vmean:     475.0 cm/s MV E velocity: 114.00 cm/s MV A velocity: 150.00 cm/s MV E/A ratio:  0.76 Lyman Bishop MD Electronically signed by Lyman Bishop MD Signature Date/Time: 08/28/2020/3:37:42 PM    Final     Cardiac Studies   Echo reviewed, see above  Patient Profile     75 y.o. female with medical history of neurosarcoidosis, schizoaffective disorder, Parkinson's disease, hypertension, hyperlipidemia who was admitted on 08/27/2020 with altered mental status and found to have polymorphic ventricular tachycardia.   Assessment & Plan    Continue aggressive electrolyte replenishment (K>4, MG>2). Continue isoproterenol to keep HR>80. Avoid all QT prolonging medications. Recheck 12-lead ECG in AM.     For questions or updates, please contact [place] Please consult [email or web address] for contact info under        Signed, Sanda Klein, MD  08/29/2020, 10:07 AM

## 2020-08-29 NOTE — Progress Notes (Signed)
Pharmacy Note  Pt is a 75yo female that presented to ED with Vtach >> brief periods of NSR in 60s-70s but returned to bigeminy with occasional torsades.  Required multiple doses of magnesium as well as isoproterenol infusion to shorten QTc.  Pharmacy has been asked to review PTA med list for QTc-prolonging medications.  The following medications on the pt's SNF MAR that are strongly associated with prolonging QTc include fluoxetine and trazodone, both taken daily, as well as epinephrine, which is PRN and unclear whether the pt has received any doses recently.  EDP notes mention olanzapine, which is associated with QTc prolongation, but this medication has not been verified since 2020; it is unclear when this medication was discontinued.  The 2020 med list also includes donepezil, which can cause QTc prolongation, but this too seems to have been discontinued at some point.  Pt rec'd amiodarone in ED for Vtach, which could have precipitated torsades if pt had not been disrhythmic prior to arrival.  At admission offending meds were appropriately discontinued.  Compazine was ordered for N/V; this can cause QTc prolongation but without a strong association; would recommend aprepitant 40mg  PO x1 prn for N/V as this is not associated with QTc prolongation, though this would be off label, and IV is not available at [facility].  Will continue to monitor peripherally.   Wynona Neat, PharmD, BCPS 08/29/2020 7:19 AM

## 2020-08-29 NOTE — Progress Notes (Signed)
Pharmacy Note  Pt is a 75yo female that presented to ED with Vtach >> brief periods of NSR in 60s-70s but returned to bigeminy with occasional torsades.  Required multiple doses of magnesium as well as isoproterenol infusion to shorten QTc.  Pharmacy has been asked to review PTA med list for QTc-prolonging medications.  The following medications on the pt's SNF MAR that are strongly associated with prolonging QTc include fluoxetine and trazodone, both taken daily, as well as epinephrine, which is PRN and unclear whether the pt has received any doses recently. Of note new literature indicates fluoxetine to have a very low effect on QTc prolongation if any so I do not have any better agents to recommend than her current therapy. Could consider melatonin as needed for sleep and discontinue trazodone.   Unfortunately besides also recommending to keep electrolytes supplemented as being done and reviewing meds carefully I have little more to add at this time.   Pharmacy to continue to monitor peripherally.  Erin Hearing PharmD., BCPS Clinical Pharmacist 08/29/2020 2:26 PM

## 2020-08-29 NOTE — Progress Notes (Addendum)
NAME:  Sheryl [name], MRN:  [id number], DOB:  1946/06/05, LOS: 0 ADMISSION DATE:  08/27/2020, CONSULTATION DATE:  08/28/2020 REFERRING MD:  Antonieta Pert CHIEF COMPLAINT:  Arrhythmia    Brief History:  75 year old female brought to the emergency department from local skilled nursing facility, [facility], for reported behavioral disturbances however on arrival patient was found pale with bradycardia and later developed V. tach.  PCCM consulted for persistent arrhythmias requiring transfer ICU for continuous antiarrhythmic infusion  History of Present Illness:  Sheryl [name] is a 75 y.o. with a past medical history of schizoaffective disorder, sleep apnea no on CPAP, chronic lymphocytic leukemia, DJD, fibromyalgia, HTN. 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 EMS was initially called for complaints of behavorial disturbances but on EMS arrival she was seen pale and significantly bradycardic. Per report patent then developed Vtach and was given 150 of amiodarone and transported to the ED. Patient is on multiple psychotropic medications including; Zyprexa, Ativan, Xanax, and Prozac.   On arrival patient was seen bradycardic and mildly hypertensive. Labwork significant for K 3.2, Creatinine 1.05, phosp 2.2, Mg 1.3, BNP 120, WBC 12.5, and Lactic 2.0. CXR negative Patient was seen by cariology and it was felt she arrhythmia was secondary to polymorphic ventricular tachycardia in the setting of significantly prolonged QTc. Given need for Isoproterenol drip PCCM was consulted for assistance in transfer to ICU and management of medical conditions.   Past Medical History:  Schizoaffective disorder, sleep apnea no on CPAP, chronic lymphocytic leukemia, DJD, fibromyalgia, HTN. 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  Continues to have runs of polymorphic VT consistent with torsade de pointes.  Objective   Blood pressure (!) 113/37, pulse 86, temperature 98.4 F (36.9 C), resp. rate (!) 27, SpO2 95 %.        Intake/Output Summary (Last 24 hours) at 08/29/2020 1315 Last data filed at 08/29/2020 0600 Gross per 24 hour  Intake 787.14 ml  Output 300 ml  Net 487.14 ml   There were no vitals filed for this visit.  Examination: General: Chronically ill appearing elderly female lying in bed in intermittent distress improved with lorazepam HEENT: [place]/AT, MM dry PERRL,  Neuro: Seen to move all limbs and is redirectable.  Currently sleeping. CV: s1s2 regular rate and rhythm, no murmur, rubs, or gallops,  PULM:  Clear to ascultation bilaterally, no added breath sounds, no increased work of breathing  GI: soft, bowel sounds active in all 4 quadrants, non-tender, non-distended Extremities: warm/dry, no edema  Skin: no rashes or lesions  Resolved Hospital Problem list     Assessment & Plan:  Polymorphic ventricular tachycardia/Torsade de pointes  QTc prolongation Hypomagnesemia, Hypokalemia, Hypophosphatemia Schizoaffective disorder Neurosarcoidosis Chronic lymphocytic Essential hypertension  Plan: -Electrolyte abnormalities have been corrected. -Redosed with magnesium.  Oral magnesium supplementation -Resumed home psychiatric medication psychiatric medication.  Consider substituting quetiapine for olanzapine as the former has less QT prolongation once arrhythmias subside.  Also mindful of family's request to prioritize comfort over arrhythmia management. -Continue isoproterenol although concerning the patient is continued to have  arrhythmias in spite of increased heart rate.  Best practice (evaluated daily)  Diet: Cardiac  Pain/Anxiety/Delirium protocol (if indicated): Home medications reordered VAP protocol (if indicated): N/A DVT prophylaxis: Lovenox GI prophylaxis: PPI Glucose control: Monitor  Mobility: Bedrest  Disposition: Transfer to ICU  Goals of Care:  Last date of multidisciplinary goals of care discussion:3/4, please see Palliative medicine team note written 3/4 Code Status: DNR/DNI  Labs   CBC: Recent Labs  Lab 08/27/20 2114 08/27/20 2142 08/29/20 0123  WBC 12.5*  --  8.2  NEUTROABS 8.9*  --   --   HGB 13.6 14.3 12.0  HCT 40.5 42.0 35.9*  MCV 84.2  --  85.5  PLT 198  --  112*    Basic Metabolic Panel: Recent Labs  Lab 08/27/20 2114 08/27/20 2142 08/28/20 1551 08/29/20 0123  NA 139 143 143 139  K 3.2* 3.1* 4.2 3.7  CL 104 104 108 107  CO2 23  --  24 20*  GLUCOSE 90 87 96 128*  BUN 13 12 10 9   CREATININE 1.05* 1.10* 1.01* 0.96  CALCIUM 9.0  --  8.7* 8.3*  MG 1.3*  --  2.1 2.3  PHOS 2.2*  --   --  4.6   GFR: Estimated Creatinine Clearance: 49.4 mL/min (by C-G formula based on SCr of 0.96 mg/dL). Recent Labs  Lab 08/27/20 2114 08/27/20 2255 08/28/20 0328 08/29/20 0123  WBC 12.5*  --   --  8.2  LATICACIDVEN  --  2.0* 1.2  --     Liver Function Tests: Recent Labs  Lab 08/27/20 2114  AST 16  ALT 14  ALKPHOS 119  BILITOT 0.8  PROT 6.1*  ALBUMIN 3.5   No results for input(s): LIPASE, AMYLASE in the last 168 hours. No results for input(s): AMMONIA in the last 168 hours.  ABG    Component Value Date/Time   TCO2 24 08/27/2020 2142     Coagulation Profile: Recent Labs  Lab 08/27/20 2114  INR 1.0    Cardiac Enzymes: No results for input(s): CKTOTAL, CKMB, CKMBINDEX, TROPONINI in the last 168 hours.  HbA1C: Hgb A1c MFr Bld  Date/Time Value Ref Range Status  08/29/2020 01:23 AM 5.5 4.8 - 5.6 % Final    Comment:    (NOTE) Pre diabetes:           5.7%-6.4%  Diabetes:              >6.4%  Glycemic control for   <7.0% adults with diabetes   04/17/2012 10:36 AM 5.7 4.6 - 6.5 % Final    Comment:    Glycemic Control Guidelines for People with Diabetes:Non Diabetic:  <6%Goal of Therapy: <7%Additional Action Suggested:  >8%     CBG: Recent Labs  Lab 08/28/20 2256 08/28/20 2347 08/29/20 0344 08/29/20 0753 08/29/20 1143  GLUCAP 125* 118* 123* 110* 103*   08/29/2020, 1:15 PM  CRITICAL CARE Performed by: Kipp Brood   Total critical care time: 40 minutes  Critical care time was exclusive of separately billable procedures and treating other patients.  Critical care was necessary to treat or prevent imminent or life-threatening deterioration.  Critical care was time spent personally by me on the following activities: development of treatment plan with patient and/or surrogate as well as nursing, discussions with consultants, evaluation of patient's response to treatment, examination of patient, obtaining history from patient or surrogate, ordering and performing treatments and interventions, ordering and review of laboratory studies, ordering and review of radiographic studies, pulse  oximetry, re-evaluation of patient's condition and participation in multidisciplinary rounds.  Kipp Brood, MD [facility] ICU Physician [place]  Pager: [phone] Mobile: [phone] After hours: [phone].

## 2020-08-30 DIAGNOSIS — C9110 Chronic lymphocytic leukemia of B-cell type not having achieved remission: Secondary | ICD-10-CM

## 2020-08-30 DIAGNOSIS — I4581 Long QT syndrome: Secondary | ICD-10-CM | POA: Diagnosis not present

## 2020-08-30 DIAGNOSIS — I472 Ventricular tachycardia: Secondary | ICD-10-CM | POA: Diagnosis not present

## 2020-08-30 LAB — GLUCOSE, CAPILLARY
Glucose-Capillary: 113 mg/dL — ABNORMAL HIGH (ref 70–99)
Glucose-Capillary: 116 mg/dL — ABNORMAL HIGH (ref 70–99)
Glucose-Capillary: 101 mg/dL — ABNORMAL HIGH (ref 70–99)
Glucose-Capillary: 94 mg/dL (ref 70–99)

## 2020-08-30 MED ORDER — FUROSEMIDE 10 MG/ML IJ SOLN: 80.0000 mg | Freq: Once | INTRAMUSCULAR | Status: DC

## 2020-08-30 MED ORDER — POTASSIUM CHLORIDE CRYS ER 20 MEQ PO TBCR: 40.0000 meq | EXTENDED_RELEASE_TABLET | Freq: Once | ORAL | Status: DC

## 2020-08-30 MED ORDER — OXYCODONE HCL 5 MG PO TABS
5.0000 mg | ORAL_TABLET | Freq: Every day | ORAL | Status: DC
  Filled 2020-08-30: qty 1

## 2020-08-30 MED ORDER — FUROSEMIDE 10 MG/ML IJ SOLN: 40.0000 mg | Freq: Once | INTRAMUSCULAR | Status: DC

## 2020-08-30 NOTE — Progress Notes (Addendum)
NAME:  Sheryl [name], MRN:  [id number], DOB:  12-08-1945, LOS: 1 ADMISSION DATE:  08/27/2020, CONSULTATION DATE:  08/28/2020 REFERRING MD:  Antonieta Pert CHIEF COMPLAINT:  Arrhythmia    Brief History:  75 year old female brought to the emergency department from local skilled nursing facility, [facility], for reported behavioral disturbances however on arrival patient was found pale with bradycardia and later developed V. tach.  PCCM consulted for persistent arrhythmias requiring transfer ICU for continuous antiarrhythmic infusion  History of Present Illness:  Sheryl [name] is a 75 y.o. with a past medical history of schizoaffective disorder, sleep apnea no on CPAP, chronic lymphocytic leukemia, DJD, fibromyalgia, HTN. 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 EMS was initially called for complaints of behavorial disturbances but on EMS arrival she was seen pale and significantly bradycardic. Per report patent then developed Vtach and was given 150 of amiodarone and transported to the ED. Patient is on multiple psychotropic medications including; Zyprexa, Ativan, Xanax, and Prozac.   On arrival patient was seen bradycardic and mildly hypertensive. Labwork significant for K 3.2, Creatinine 1.05, phosp 2.2, Mg 1.3, BNP 120, WBC 12.5, and Lactic 2.0. CXR negative Patient was seen by cariology and it was felt she arrhythmia was secondary to polymorphic ventricular tachycardia in the setting of significantly prolonged QTc. Given need for Isoproterenol drip PCCM was consulted for assistance in transfer to ICU and management of medical conditions.   Past Medical History:  Schizoaffective disorder, sleep apnea no on CPAP, chronic lymphocytic leukemia, DJD, fibromyalgia, HTN. 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 Patient is somnolent. Spoke to patient's niece who states that patient does spend many hours of the day sleeping. Isoproterenol stopped.  Objective   Blood pressure (!) 167/66, pulse 81, temperature 99.8 F (37.7 C), resp. rate (!) 26, SpO2 100 %.        Intake/Output Summary (Last 24 hours) at 08/30/2020 1416 Last data filed at 08/30/2020 0700 Gross per 24 hour  Intake 929.04 ml  Output 2850 ml  Net -1920.96 ml   There were no vitals filed for this visit.  Examination: General: Chronically ill appearing elderly female lying in bed in no distress. Sleeping HEENT: [place]/AT, MM dry PERRL,  Neuro: Seen to move all limbs and is redirectable.  Currently sleeping. CV: s1s2 regular rate and rhythm, no murmur, rubs, or gallops,  PULM:  Clear to ascultation bilaterally, no added breath sounds, no increased work of breathing. Snores while sleeping GI: soft, bowel sounds active in all 4 quadrants, non-tender, non-distended Extremities: warm/dry, no edema  Skin: no rashes or lesions  Resolved Hospital Problem list     Assessment & Plan:  Polymorphic ventricular tachycardia/Torsade de pointes  QTc prolongation Hypomagnesemia, Hypokalemia, Hypophosphatemia Schizoaffective disorder Neurosarcoidosis Chronic lymphocytic Essential hypertension  Plan: -Ready for transfer back to floor. -Have restarted all medications except for Zyprexa. -Keep on oral magnesium supplementation. This may help normalize QTC to allow reintroduction of Zyprexa if necessary for behavior.  Best practice (evaluated daily)  Diet: Cardiac  Pain/Anxiety/Delirium protocol (if indicated): Home medications reordered VAP protocol (  if indicated): N/A DVT  prophylaxis: Lovenox GI prophylaxis: PPI Glucose control: Monitor  Mobility: Bedrest  Disposition: Transfer to MedSurg, order reconciliation performed, TRH advised, spoke to niece who is informed and agrees with plan of care.  Goals of Care:  Last date of multidisciplinary goals of care discussion:3/4, please see Palliative medicine team note written 3/4 Code Status: DNR/DNI  Labs   CBC: Recent Labs  Lab 08/27/20 2114 08/27/20 2142 08/29/20 0123  WBC 12.5*  --  8.2  NEUTROABS 8.9*  --   --   HGB 13.6 14.3 12.0  HCT 40.5 42.0 35.9*  MCV 84.2  --  85.5  PLT 198  --  112*    Basic Metabolic Panel: Recent Labs  Lab 08/27/20 2114 08/27/20 2142 08/28/20 1551 08/29/20 0123  NA 139 143 143 139  K 3.2* 3.1* 4.2 3.7  CL 104 104 108 107  CO2 23  --  24 20*  GLUCOSE 90 87 96 128*  BUN 13 12 10 9   CREATININE 1.05* 1.10* 1.01* 0.96  CALCIUM 9.0  --  8.7* 8.3*  MG 1.3*  --  2.1 2.3  PHOS 2.2*  --   --  4.6   GFR: Estimated Creatinine Clearance: 49.4 mL/min (by C-G formula based on SCr of 0.96 mg/dL). Recent Labs  Lab 08/27/20 2114 08/27/20 2255 08/28/20 0328 08/29/20 0123  WBC 12.5*  --   --  8.2  LATICACIDVEN  --  2.0* 1.2  --     Liver Function Tests: Recent Labs  Lab 08/27/20 2114  AST 16  ALT 14  ALKPHOS 119  BILITOT 0.8  PROT 6.1*  ALBUMIN 3.5   No results for input(s): LIPASE, AMYLASE in the last 168 hours. No results for input(s): AMMONIA in the last 168 hours.  ABG    Component Value Date/Time   TCO2 24 08/27/2020 2142     Coagulation Profile: Recent Labs  Lab 08/27/20 2114  INR 1.0    Cardiac Enzymes: No results for input(s): CKTOTAL, CKMB, CKMBINDEX, TROPONINI in the last 168 hours.  HbA1C: Hgb A1c MFr Bld  Date/Time Value Ref Range Status  08/29/2020 01:23 AM 5.5 4.8 - 5.6 % Final    Comment:    (NOTE) Pre diabetes:          5.7%-6.4%  Diabetes:              >6.4%  Glycemic control for   <7.0% adults with diabetes    04/17/2012 10:36 AM 5.7 4.6 - 6.5 % Final    Comment:    Glycemic Control Guidelines for People with Diabetes:Non Diabetic:  <6%Goal of Therapy: <7%Additional Action Suggested:  >8%     CBG: Recent Labs  Lab 08/29/20 1143 08/29/20 1528 08/29/20 1932 08/30/20 0733 08/30/20 1108  GLUCAP 103* 94 113* 113* 116*   08/30/2020, 2:16 PM  35 minutes spent with greater than 50% of time in counseling and coordination of care.  Kipp Brood, MD [facility] ICU Physician [place]  Pager: [phone] Mobile: [phone] After hours: [phone].

## 2020-08-30 NOTE — Progress Notes (Signed)
No additional complex ventricular arrhythmia in the last 24 hours. Occasional PVCs. Isoproterenol stopped this morning. QT interval markedly improved, now QTC 420 ms. Avoid concomitant use of multiple QT prolonging agents.  Otherwise no new recommendations.  [facility] will sign off.   Medication Recommendations:  Per primary team Other recommendations (labs, testing, etc):  Repeat ECG after adding any psychotropic medications with QT prolonging potential Follow up as an outpatient:  As needed

## 2020-08-30 NOTE — Progress Notes (Signed)
Daily Progress Note   Patient Name: Sheryl [name]       Date: 08/30/2020 DOB: Apr 14, 1946  Age: 75 y.o. MRN#: [id number] Attending Physician: Kipp Brood, MD Primary Care Physician: Marletta Lor, MD (Inactive) Admit Date: 08/27/2020  Reason for Consultation/Follow-up:    To discuss complex medical decision making related to patient's goals of care  No further arrhythmia.  QTC has normalized.  Cardiology signed off. Patient not waking.  Discussed with RN.  Medical team feels her deep slumber is likely caused by the Ativan she received yesterday. Patient was able to take her previous psychotropic medications yesterday without causing prolonged QTC.  Subjective: Spoke with [name] on the phone.  Gave her the patient's current status.  She commented that this may be Sheryl [name]'s body leveling out.  She is hopeful that Sheryl [name] will return to [facility] when she normalizes.      Assessment: VSS.  Patient unable to wake to raise voice or sternal rub.  Appears  obtunded.   Patient Profile/HPI:  75 y.o.female"Sheryl [name]"with past medical history ofdementia, neurosarcoidosis, schizoaffective disorder, CLL, OSA, DJD, fibromyalgia, and right eye vision losswho was admitted on 3/3/2022after a behavorial disturbance.In the ambulance she went into [facility] several times. Later she was reported to have developed Torsades de pointe. She was treated with magnesium and amiodarone. PMT was consulted for [name] as she is a DNR.   Length of Stay: 1   Vital Signs: BP 140/66   Pulse 80   Temp 99.8 F (37.7 C)   Resp (!) 23   SpO2 100%  SpO2: SpO2: 100 % O2 Device: O2 Device: Nasal Cannula O2 Flow Rate: O2 Flow Rate (L/min): 3 L/min       Palliative Assessment/Data: 10%     Palliative Care Plan     Recommendations/Plan:  PMT will check back in on Tuesday.  If she wakes/stabilizes please discharge back to [facility] with Palliative Care outpatient to follow.  Code Status:  DNR  Prognosis:   Unable to determine   Discharge Planning:  To Be Determined.   Likely [facility] memory care with Palliative care to follow.  Care plan was discussed with RN and Niece HCPOA  Thank you for allowing the Palliative Medicine Team to assist in the care of this patient.  Total time spent:  25 min.  Greater than 50%  of this time was spent counseling and coordinating care related to the above assessment and plan.  Florentina Jenny, PA-C Palliative Medicine  Please contact Palliative MedicineTeam phone at [phone] for questions and concerns between 7 am - 7 pm.   Please see AMION for individual provider pager numbers.

## 2020-08-31 LAB — CBC WITH DIFFERENTIAL/PLATELET
Abs Immature Granulocytes: 0.02 10*3/uL (ref 0.00–0.07)
Basophils Absolute: 0.1 10*3/uL (ref 0.0–0.1)
Basophils Relative: 1 %
Eosinophils Absolute: 0.8 10*3/uL — ABNORMAL HIGH (ref 0.0–0.5)
Eosinophils Relative: 8 %
HCT: 36.7 % (ref 36.0–46.0)
Hemoglobin: 11.8 g/dL — ABNORMAL LOW (ref 12.0–15.0)
Immature Granulocytes: 0 %
Lymphocytes Relative: 13 %
Lymphs Abs: 1.3 10*3/uL (ref 0.7–4.0)
MCH: 28.2 pg (ref 26.0–34.0)
MCHC: 32.2 g/dL (ref 30.0–36.0)
MCV: 87.8 fL (ref 80.0–100.0)
Monocytes Absolute: 0.8 10*3/uL (ref 0.1–1.0)
Monocytes Relative: 8 %
Neutro Abs: 6.9 10*3/uL (ref 1.7–7.7)
Neutrophils Relative %: 70 %
Platelets: 137 10*3/uL — ABNORMAL LOW (ref 150–400)
RBC: 4.18 MIL/uL (ref 3.87–5.11)
RDW: 14.5 % (ref 11.5–15.5)
WBC: 9.8 10*3/uL (ref 4.0–10.5)
nRBC: 0 % (ref 0.0–0.2)

## 2020-08-31 LAB — GLUCOSE, CAPILLARY
Glucose-Capillary: 93 mg/dL (ref 70–99)
Glucose-Capillary: 85 mg/dL (ref 70–99)
Glucose-Capillary: 81 mg/dL (ref 70–99)
Glucose-Capillary: 98 mg/dL (ref 70–99)

## 2020-08-31 LAB — BASIC METABOLIC PANEL
Anion gap: 9 (ref 5–15)
BUN: 7 mg/dL — ABNORMAL LOW (ref 8–23)
CO2: 25 mmol/L (ref 22–32)
Calcium: 8.7 mg/dL — ABNORMAL LOW (ref 8.9–10.3)
Chloride: 107 mmol/L (ref 98–111)
Creatinine, Ser: 0.74 mg/dL (ref 0.44–1.00)
GFR, Estimated: >60 mL/min (ref >60)
Glucose, Bld: 96 mg/dL (ref 70–99)
Potassium: 4 mmol/L (ref 3.5–5.1)
Sodium: 141 mmol/L (ref 135–145)

## 2020-08-31 LAB — MAGNESIUM: Magnesium: 1.8 mg/dL (ref 1.7–2.4)

## 2020-08-31 MED ORDER — NALOXONE NEWBORN-WH INJECTION 0.4 MG/ML: 0.4000 mg | Freq: Once | INTRAMUSCULAR | Status: DC

## 2020-08-31 MED ORDER — NALOXONE HCL 0.4 MG/ML IJ SOLN
INTRAMUSCULAR | Status: AC
  Administered 2020-08-31: 0.4 mg via INTRAVENOUS
  Filled 2020-08-31: qty 1

## 2020-08-31 MED ORDER — NALOXONE HCL 0.4 MG/ML IJ SOLN: 0.4000 mg | INTRAMUSCULAR | Status: DC | PRN

## 2020-08-31 MED ORDER — MAGNESIUM SULFATE 2 GM/50ML IV SOLN
2.0000 g | Freq: Once | INTRAVENOUS | Status: AC
  Administered 2020-08-31: 2 g via INTRAVENOUS
  Filled 2020-08-31: qty 50

## 2020-08-31 MED ORDER — NALOXONE NEWBORN-WH INJECTION 0.4 MG/ML: 0.4000 mg | Freq: Once | INTRAMUSCULAR | Status: AC

## 2020-08-31 NOTE — Progress Notes (Signed)
PROGRESS NOTE   Sheryl [name]  DVV:[id number] DOB: [birth year] DOA: 08/27/2020 PCP: Marletta Lor, MD (Inactive)  Brief Narrative:   75 year old white female from [facility] Neurosarcoid, schizoaffective, Parkinson's plus, fibromyalgia Right optic neuropathy + visual loss Allegedly recent increase in antipsychotics at nursing facility Admit 3/3 toxic metabolic encephalopathy, bradycardic = 30-->V. tach-->amiodarone and EMS Sustained polymorphic V. tach + intermittent torsades-->amiodarone gtt. Metabolic work-up = potassium 3.2 magnesium 1.3 WBC 12.5 lactic acid 2 CXR = negative  Critical care consulted-started isoproterenol gtt. Respiratory viral panel by them = negative-they discontinued Zyprexa and aggressively repleted magnesium   Hospital-Problem based course Bradycardic + VT arrest Hypomagnesemia  Keep electrolytes magnesium >2.4, potassium >4 Only hydralazine at this time for blood pressure >626 systolic Cardiology signed off because of reduction in PVCs and resolution of problems Somnolence secondary to polypharmacy not metabolic encephalopathy Fibromyalgia Discontinued Xanax,Seroquel, trazodone, Prozac as she is extremely sleepy--may resume only Seroquel if she awakens in the next 24 hours Narcan 0.4x1 made no effect to mentation on 3/7  Her baseline is she sleeps for15-18 hrs a day Parkinson's plus, neurosarcoid, optic neuropathy Has not received carbidopa for over 48 hours Hopeful that patient will recover and get back on diurnal cycle Chronic lymphocytic leukemia Clinically not significant Sleep apnea not on CPAP If not more awake by tomorrow we will consider a blood gas Expect that this should improve with all of the meds in her system washing out Prior steroid myopathy   DVT prophylaxis: Lovenox Code Status: DNR Family Communication: None present at the bedside-I discussed with family member Macala Baldonado [phone] Disposition:  Status is:  Inpatient  Remains inpatient appropriate because:Hemodynamically unstable and Ongoing active pain requiring inpatient pain management   Dispo: The patient is from: Home              Anticipated d/c is to: Home              Patient currently is not medically stable to d/c.   Difficult to place patient Yes  consultants:   Cardiology  Pallaitve  Procedures:   Antimicrobials: none    Subjective: Very sleepy cannot rouse Didn't awaken with narcan No cp   Objective: Vitals:   08/31/20 0722 08/31/20 0800 08/31/20 0900 08/31/20 1000  BP:  (!) 150/46 138/68 (!) 147/45  Pulse:  80 79 79  Resp:  (!) 25 (!) 26 (!) 26  Temp: 98.9 F (37.2 C)     TempSrc: Axillary     SpO2:  96% 93% 97%    Intake/Output Summary (Last 24 hours) at 08/31/2020 1050 Last data filed at 08/31/2020 0400 Gross per 24 hour  Intake 53.08 ml  Output 900 ml  Net -846.92 ml   There were no vitals filed for this visit.  Examination:  Awakens minimally ctab no added sound  s1 s2 no m/r/g abd obese nt nd  Slight le swelling Cannot assess fully her completely neurologically-GCS ~12  Data Reviewed: personally reviewed   CBC    Component Value Date/Time   WBC 9.8 08/31/2020 0811   RBC 4.18 08/31/2020 0811   HGB 11.8 (L) 08/31/2020 0811   HGB 14.0 09/24/2015 0917   HCT 36.7 08/31/2020 0811   HCT TEST REQUEST RECEIVED WITHOUT APPROPRIATE SPECIMEN 11/29/2016 0530   HCT 43.4 09/24/2015 0917   PLT 137 (L) 08/31/2020 0811   PLT 198 09/24/2015 0917   MCV 87.8 08/31/2020 0811   MCV 85.1 09/24/2015 0917   MCH 28.2 08/31/2020 5009  MCHC 32.2 08/31/2020 0811   RDW 14.5 08/31/2020 0811   RDW 16.2 (H) 09/24/2015 0917   LYMPHSABS 1.3 08/31/2020 0811   LYMPHSABS 1.4 09/24/2015 0917   MONOABS 0.8 08/31/2020 0811   MONOABS 0.3 09/24/2015 0917   EOSABS 0.8 (H) 08/31/2020 0811   EOSABS 0.1 09/24/2015 0917   BASOSABS 0.1 08/31/2020 0811   BASOSABS 0.0 09/24/2015 0917   CMP Latest Ref Rng & Units 08/31/2020  08/29/2020 08/28/2020  Glucose 70 - 99 mg/dL 96 128(H) 96  BUN 8 - 23 mg/dL 7(L) 9 10  Creatinine 0.44 - 1.00 mg/dL 0.74 0.96 1.01(H)  Sodium 135 - 145 mmol/L 141 139 143  Potassium 3.5 - 5.1 mmol/L 4.0 3.7 4.2  Chloride 98 - 111 mmol/L 107 107 108  CO2 22 - 32 mmol/L 25 20(L) 24  Calcium 8.9 - 10.3 mg/dL 8.7(L) 8.3(L) 8.7(L)  Total Protein 6.5 - 8.1 g/dL - - -  Total Bilirubin 0.3 - 1.2 mg/dL - - -  Alkaline Phos 38 - 126 U/L - - -  AST 15 - 41 U/L - - -  ALT 0 - 44 U/L - - -   Radiology Studies: No results found.  Scheduled Meds: . carbidopa-levodopa  1 tablet Oral TID  . Chlorhexidine Gluconate Cloth  6 each Topical Daily  . enoxaparin (LOVENOX) injection  40 mg Subcutaneous Q24H  . fluticasone  1 spray Each Nare Daily  . naloxone  0.4 mg Intramuscular Once   Continuous Infusions:   LOS: 2 days   Time spent: Lake Cavanaugh, MD [facility] To contact the attending provider between 7A-7P or the covering provider during after hours 7P-7A, please log into the web site [email or web address] and access using universal [place] password for that web site. If you do not have the password, please call the hospital operator.  08/31/2020, 10:50 AM

## 2020-08-31 NOTE — Progress Notes (Signed)
Marked reduction in PVC frequency. No VT. QT normal. No new recommendations.

## 2020-08-31 NOTE — Hospital Course (Signed)
75 year old white female from [facility] SNF Neurosarcoid, schizoaffective, Parkinson's plus, fibromyalgia Right optic neuropathy + visual loss Allegedly recent increase in antipsychotics at nursing facility Admit 3/3 toxic metabolic encephalopathy, bradycardic = 30-->V. tach-->amiodarone and EMS Sustained polymorphic V. tach + intermittent torsades-->amiodarone gtt. Metabolic work-up = potassium 3.2 magnesium 1.3 WBC 12.5 lactic acid 2 CXR = negative  Critical care consulted-started isoproterenol gtt. Respiratory viral panel by them = negative-day discontinued Zyprexa and aggressively repleted magnesium  Bradycardic + VT arrest Hypomagnesemia Parkinson's plus, neurosarcoid, optic neuropathy Fibromyalgia Chronic lymphocytic leukemia Sleep apnea not on CPAP Prior steroid myopathy   Data reviewed Sodium 141, potassium 4.0 BUN/creatinine 7/0.7 No CBC today ordering

## 2020-09-01 LAB — COMPREHENSIVE METABOLIC PANEL
ALT: <5 U/L (ref 0–44)
AST: 20 U/L (ref 15–41)
Albumin: 2.6 g/dL — ABNORMAL LOW (ref 3.5–5.0)
Alkaline Phosphatase: 101 U/L (ref 38–126)
Anion gap: 10 (ref 5–15)
BUN: 11 mg/dL (ref 8–23)
CO2: 27 mmol/L (ref 22–32)
Calcium: 8.9 mg/dL (ref 8.9–10.3)
Chloride: 104 mmol/L (ref 98–111)
Creatinine, Ser: 0.74 mg/dL (ref 0.44–1.00)
GFR, Estimated: >60 mL/min (ref >60)
Glucose, Bld: 86 mg/dL (ref 70–99)
Potassium: 3.9 mmol/L (ref 3.5–5.1)
Sodium: 141 mmol/L (ref 135–145)
Total Bilirubin: 0.8 mg/dL (ref 0.3–1.2)
Total Protein: 5.4 g/dL — ABNORMAL LOW (ref 6.5–8.1)

## 2020-09-01 LAB — GLUCOSE, CAPILLARY
Glucose-Capillary: 90 mg/dL (ref 70–99)
Glucose-Capillary: 83 mg/dL (ref 70–99)
Glucose-Capillary: 92 mg/dL (ref 70–99)
Glucose-Capillary: 93 mg/dL (ref 70–99)
Glucose-Capillary: 101 mg/dL — ABNORMAL HIGH (ref 70–99)

## 2020-09-01 LAB — SARS CORONAVIRUS 2 (TAT 6-24 HRS): SARS Coronavirus 2: NEGATIVE

## 2020-09-01 MED ORDER — LORAZEPAM 0.5 MG PO TABS
0.5000 mg | ORAL_TABLET | Freq: Once | ORAL | Status: AC
  Administered 2020-09-01: 0.5 mg via ORAL
  Filled 2020-09-01: qty 1

## 2020-09-01 MED ORDER — LORAZEPAM 2 MG/ML IJ SOLN
0.5000 mg | Freq: Four times a day (QID) | INTRAMUSCULAR | Status: DC | PRN
  Administered 2020-09-01: 0.5 mg via INTRAVENOUS
  Filled 2020-09-01: qty 1

## 2020-09-01 MED ORDER — OLANZAPINE 5 MG PO TABS
5.0000 mg | ORAL_TABLET | Freq: Every day | ORAL | Status: DC
  Administered 2020-09-01: 5 mg via ORAL
  Filled 2020-09-01 (×2): qty 1

## 2020-09-01 NOTE — NC FL2 (Signed)
[place] LEVEL OF CARE SCREENING TOOL     IDENTIFICATION  Patient Name: Sheryl [name] Birthdate: [birth year] Sex: female Admission Date (Current Location): 08/27/2020  [facility] and [place] Number:  Herbalist and Address:  [facility] [place], [place] [address] [place], [place] [address]      Provider Number: [id number]  Attending Physician Name and Address:  Nita Sells, MD  Relative Name and Phone Number:  Maretta Bees [phone]    Current Level of Care: Hospital Recommended Level of Care: [place] ([place]) Prior Approval Number:    Date Approved/Denied:   PASRR Number:    Discharge Plan: Other (Comment) ([facility] memory care)    Current Diagnoses: Patient Active Problem List   Diagnosis Date Noted  . VT (ventricular tachycardia) ([place]) 08/29/2020  . Torsades de pointes ([place]) 08/28/2020  . Hypomagnesemia 08/28/2020  . Hypokalemia 08/28/2020  . Hypophosphatemia 08/28/2020  . Dementia due to Parkinson's disease without behavioral disturbance ([place])   . Palliative care encounter   . Sinusitis nasal 06/24/2016  . History of IBS 10/31/2014  . 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 Back pain 03/26/2014  . Preventive measure 03/26/2014  . Spinal stenosis 04/17/2012  . 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 Chronic lymphocytic leukemia ([place]) 12/14/2006  . Dyslipidemia 12/14/2006  . EXOGENOUS OBESITY 12/14/2006  . Essential hypertension 12/14/2006  . Osteoarthritis 12/14/2006  . FIBROMYALGIA 12/14/2006    Orientation RESPIRATION BLADDER Height & Weight     Self  O2 (Nasal Cannula 3 liters) Incontinent,External catheter (External Urinary Catheter) Weight:   Height:     BEHAVIORAL SYMPTOMS/MOOD NEUROLOGICAL BOWEL NUTRITION STATUS      Incontinent Diet (See Discharge Summary)   AMBULATORY STATUS COMMUNICATION OF NEEDS Skin   Limited Assist Verbally Other (Comment) (ecchymosis,arm,hand,bil.,buttocks,midfoam,non tenting,wound inc.sacrum midsmall fissure-like tear,center gluteal cleft,foamliftdressing to assesssiteeveryshift clean,dry,intact,twiceaday,wound inc.knee anterior,L,scabbed oversmall adhesion,wound inc.LDAs)                       Personal Care Assistance Level of Assistance  Bathing,Feeding,Dressing Bathing Assistance: Limited assistance Feeding assistance: Maximum assistance (total assist) Dressing Assistance: Limited assistance     Functional Limitations Info  Sight,Hearing,Speech Sight Info: Impaired Hearing Info: Adequate Speech Info: Adequate    SPECIAL CARE FACTORS FREQUENCY  PT (By licensed PT),OT (By licensed OT)     PT Frequency: 3x min weekly OT Frequency: 3x min weekly            Contractures Contractures Info: Not present    Additional Factors Info  Code Status,Allergies,Psychotropic Code Status Info: DNR Allergies Info: Adhesive (tape),Latex, Bactrim (sulfamethoxazole-trimethoprim),Cyclophosphamide,Sulfa Antibiotics,Sulfa Antibiotics Psychotropic Info: OLANZapine (ZYPREXA) tablet 5 mg daily at bedtime         Current Medications (09/01/2020):  This is the current hospital active medication list Current Facility-Administered Medications  Medication Dose Route Frequency Provider Last Rate Last Admin  . acetaminophen (TYLENOL) tablet 650 mg  650 mg Oral Q6H PRN Kipp Brood, MD       Or  . acetaminophen (TYLENOL) suppository 650 mg  650 mg Rectal Q6H PRN Agarwala, Einar Grad, MD      . carbidopa-levodopa (SINEMET IR) 25-100 MG per tablet immediate release 1 tablet  1 tablet Oral TID Kipp Brood, MD   1 tablet at 09/01/20 0909  . Chlorhexidine Gluconate Cloth 2 % PADS 6 each  6 each Topical Daily  Kipp Brood, MD   6 each at 09/01/20 1249  . enoxaparin (LOVENOX) injection 40 mg  40 mg Subcutaneous Q24H Kipp Brood, MD    40 mg at 09/01/20 0909  . fluticasone (FLONASE) 50 MCG/ACT nasal spray 1 spray  1 spray Each Nare Daily Agarwala, Einar Grad, MD   1 spray at 09/01/20 6269  . hydrALAZINE (APRESOLINE) injection 5 mg  5 mg Intravenous Q6H PRN Agarwala, Einar Grad, MD      . LORazepam (ATIVAN) injection 0.5 mg  0.5 mg Intravenous Q6H PRN Nita Sells, MD   0.5 mg at 09/01/20 1517  . naloxone (NARCAN) injection 0.4 mg  0.4 mg Intravenous PRN Nita Sells, MD      . OLANZapine (ZYPREXA) tablet 5 mg  5 mg Oral QHS Nita Sells, MD         Discharge Medications: Please see discharge summary for a list of discharge medications.  Relevant Imaging Results:  Relevant Lab Results:   Additional Information [phone]  Trula Ore, LCSWA

## 2020-09-01 NOTE — Progress Notes (Signed)
PROGRESS NOTE   Sheryl [name]  YKD:[id number] DOB: 02-21-1946 DOA: 08/27/2020 PCP: Marletta Lor, MD (Inactive)  Brief Narrative:   75 year old white female from [facility] Neurosarcoid, schizoaffective, Parkinson's plus, fibromyalgia Right optic neuropathy + visual loss Allegedly recent increase in antipsychotics at nursing facility Admit 3/3 toxic metabolic encephalopathy, bradycardic = 30-->V. tach-->amiodarone and EMS Sustained polymorphic V. tach + intermittent torsades-->amiodarone gtt. Metabolic work-up = potassium 3.2 magnesium 1.3 WBC 12.5 lactic acid 2 CXR = negative  Critical care consulted-started isoproterenol gtt. Respiratory viral panel by them = negative-they discontinued Zyprexa and aggressively repleted magnesium   Hospital-Problem based course Bradycardic + VT arrest Hypomagnesemia  Keep electrolytes magnesium >2.4, potassium >4-rechecking lab daily Only hydralazine at this time for blood pressure >397 systolic Cardiology signed off because of reduction in PVCs and resolution of problems Somnolence secondary to polypharmacy not metabolic encephalopathy Fibromyalgia Discontinued Xanax,Seroquel, trazodone, Prozac as she is extremely sleepy--started Zydis (not Seroquel) 5 mg at bedtime Trialed Narcan but no benefit Her baseline is she sleeps for 15-18 hrs a day Parkinson's plus, neurosarcoid, optic neuropathy Has not received carbidopa for over 48 hours Hopeful that patient will recover and get back on diurnal cycle Chronic lymphocytic leukemia Clinically not significant Sleep apnea not on CPAP goals are comfort Prior steroid myopathy   DVT prophylaxis: Lovenox Code Status: DNR Family Communication: I discussed with family member Sheryl [name] [phone] on 3/7 Disposition:  Status is: Inpatient--likely d/c am back to [facility] inpatient appropriate because:Hemodynamically unstable and Ongoing active pain requiring inpatient pain  management   Dispo: The patient is from: Home              Anticipated d/c is to: SNF              Patient currently is medically stable to d/c.   Difficult to place patient Yes  consultants:   Cardiology  Pallaitve  Procedures:   Antimicrobials: none    Subjective:  Incoherent pulling at IVs more awake however Given Ativan p.o. earlier today Nursing has paged me about her increasing agitation  Objective: Vitals:   09/01/20 1000 09/01/20 1100 09/01/20 1135 09/01/20 1200  BP: (!) 172/74 (!) 163/65  (!) 140/113  Pulse: 69 69  (!) 101  Resp: (!) 21 20  (!) 24  Temp:   98.1 F (36.7 C)   TempSrc:   Oral   SpO2: 100% 100%  100%    Intake/Output Summary (Last 24 hours) at 09/01/2020 1536 Last data filed at 09/01/2020 1200 Gross per 24 hour  Intake 390 ml  Output 250 ml  Net 140 ml   There were no vitals filed for this visit.  Examination:  More awake but incoherent ctab no added sound  s1 s2 no m/r/g abd obese nt nd  Slight le swelling She not coherent  Data Reviewed: personally reviewed   CBC    Component Value Date/Time   WBC 9.8 08/31/2020 0811   RBC 4.18 08/31/2020 0811   HGB 11.8 (L) 08/31/2020 0811   HGB 14.0 09/24/2015 0917   HCT 36.7 08/31/2020 0811   HCT TEST REQUEST RECEIVED WITHOUT APPROPRIATE SPECIMEN 11/29/2016 0530   HCT 43.4 09/24/2015 0917   PLT 137 (L) 08/31/2020 0811   PLT 198 09/24/2015 0917   MCV 87.8 08/31/2020 0811   MCV 85.1 09/24/2015 0917   MCH 28.2 08/31/2020 0811   MCHC 32.2 08/31/2020 0811   RDW 14.5 08/31/2020 0811   RDW 16.2 (H) 09/24/2015 0240  LYMPHSABS 1.3 08/31/2020 0811   LYMPHSABS 1.4 09/24/2015 0917   MONOABS 0.8 08/31/2020 0811   MONOABS 0.3 09/24/2015 0917   EOSABS 0.8 (H) 08/31/2020 0811   EOSABS 0.1 09/24/2015 0917   BASOSABS 0.1 08/31/2020 0811   BASOSABS 0.0 09/24/2015 0917   CMP Latest Ref Rng & Units 09/01/2020 08/31/2020 08/29/2020  Glucose 70 - 99 mg/dL 86 96 128(H)  BUN 8 - 23 mg/dL 11 7(L) 9   Creatinine 0.44 - 1.00 mg/dL 0.74 0.74 0.96  Sodium 135 - 145 mmol/L 141 141 139  Potassium 3.5 - 5.1 mmol/L 3.9 4.0 3.7  Chloride 98 - 111 mmol/L 104 107 107  CO2 22 - 32 mmol/L 27 25 20(L)  Calcium 8.9 - 10.3 mg/dL 8.9 8.7(L) 8.3(L)  Total Protein 6.5 - 8.1 g/dL 5.4(L) - -  Total Bilirubin 0.3 - 1.2 mg/dL 0.8 - -  Alkaline Phos 38 - 126 U/L 101 - -  AST 15 - 41 U/L 20 - -  ALT 0 - 44 U/L <5 - -   Radiology Studies: No results found.  Scheduled Meds: . carbidopa-levodopa  1 tablet Oral TID  . Chlorhexidine Gluconate Cloth  6 each Topical Daily  . enoxaparin (LOVENOX) injection  40 mg Subcutaneous Q24H  . fluticasone  1 spray Each Nare Daily  . OLANZapine  5 mg Oral QHS   Continuous Infusions:   LOS: 3 days   Time spent: 27  Sheryl Sells, MD [facility] To contact the attending provider between 7A-7P or the covering provider during after hours 7P-7A, please log into the web site [email or web address] and access using universal [place] password for that web site. If you do not have the password, please call the hospital operator.  09/01/2020, 3:36 PM

## 2020-09-01 NOTE — Plan of Care (Signed)
  Problem: Safety: Goal: Non-violent Restraint(s) Outcome: Progressing   Problem: Education: Goal: Knowledge of General Education information will improve Description: Including pain rating scale, medication(s)/side effects and non-pharmacologic comfort measures Outcome: Progressing   Problem: Health Behavior/Discharge Planning: Goal: Ability to manage health-related needs will improve Outcome: Progressing   Problem: Clinical Measurements: Goal: Ability to maintain clinical measurements within normal limits will improve Outcome: Progressing Goal: Will remain free from infection Outcome: Progressing Goal: Diagnostic test results will improve Outcome: Progressing Goal: Respiratory complications will improve Outcome: Progressing Goal: Cardiovascular complication will be avoided Outcome: Progressing   Problem: Activity: Goal: Risk for activity intolerance will decrease Outcome: Progressing   Problem: Nutrition: Goal: Adequate nutrition will be maintained Outcome: Progressing   Problem: Coping: Goal: Level of anxiety will decrease Outcome: Progressing   Problem: Elimination: Goal: Will not experience complications related to bowel motility Outcome: Progressing Goal: Will not experience complications related to urinary retention Outcome: Progressing   Problem: Pain Managment: Goal: General experience of comfort will improve Outcome: Progressing   Problem: Safety: Goal: Ability to remain free from injury will improve Outcome: Progressing   Problem: Skin Integrity: Goal: Risk for impaired skin integrity will decrease Outcome: Progressing   Problem: Education: Goal: Ability to manage disease process will improve Outcome: Progressing Goal: Individualized Educational Video(s) Outcome: Progressing   Problem: Cardiac: Goal: Ability to achieve and maintain adequate cardiopulmonary perfusion will improve Outcome: Progressing

## 2020-09-01 NOTE — TOC Initial Note (Signed)
Transition of Care [facility]) - Initial/Assessment Note    Patient Details  Name: Sheryl [name] MRN: [id number] Date of Birth: Oct 06, 1945  Transition of Care [facility]) CM/SW Contact:    Trula Ore, [place] Phone Number: 09/01/2020, 4:43 PM  Clinical Narrative:                  CSW spoke with Sheryl [name] who confirmed patient comes from [facility] memory care. Sheryl niece confirmed plan is for patient to return to ALF. CSW spoke with [facility] and they confirmed they can accept patient back when patient is medically ready. CSW made referral to [place] for palliative services to follow patient at ALF. Maryann with [facility] confirmed she will reach out to Sheryl [name].  CSW will continue to follow.   Expected Discharge Plan: Assisted Living ([facility]) Barriers to Discharge: Continued Medical Work up   Patient Goals and CMS Choice   CMS Medicare.gov Compare Post Acute Care list provided to:: Patient Represenative (must comment) (Niece [name]) Choice offered to / list presented to : NA (NIece)  Expected Discharge Plan and Services Expected Discharge Plan: Assisted Living ([place]) In-house Referral: Clinical Social Work     Living arrangements for the past 2 months: [place] ([place])                                      Prior Living Arrangements/Services Living arrangements for the past 2 months: [place] ([place]) Lives with:: Self,Facility Resident Patient language and need for interpreter reviewed:: Yes Do you feel safe going back to the place where you live?: Yes      Need for Family Participation in Patient Care: Yes (Comment) Care giver support system in place?: Yes (comment)   Criminal Activity/Legal Involvement Pertinent to Current  Situation/Hospitalization: No - Comment as needed  Activities of Daily Living      Permission Sought/Granted Permission sought to share information with : Case Manager,Family Chief Financial Officer Permission granted to share information with : Yes, Verbal Permission Granted     Permission granted to share info w AGENCY: ALF        Emotional Assessment       Orientation: : Oriented to Self Alcohol / Substance Use: Not Applicable Psych Involvement: No (comment)  Admission diagnosis:  Torsades de pointes ([place]) [I47.2] VT (ventricular tachycardia) ([place]) [I47.2] Patient Active Problem List   Diagnosis Date Noted  . VT (ventricular tachycardia) ([place]) 08/29/2020  . 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 Hypomagnesemia 08/28/2020  . Hypokalemia 08/28/2020  . Hypophosphatemia 08/28/2020  . Dementia due to Parkinson's disease without behavioral disturbance ([place])   . Palliative care encounter   . Sinusitis nasal 06/24/2016  . History of IBS 10/31/2014  . 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 Back pain 03/26/2014  . Preventive measure 03/26/2014  . Spinal stenosis 04/17/2012  . 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 Chronic lymphocytic leukemia ([place]) 12/14/2006  . Dyslipidemia 12/14/2006  . EXOGENOUS OBESITY 12/14/2006  . Essential hypertension 12/14/2006  . Osteoarthritis 12/14/2006  . FIBROMYALGIA 12/14/2006   PCP:  Marletta Lor, MD (Inactive) Pharmacy:  No Pharmacies Listed    Social Determinants of  Health (SDOH) Interventions    Readmission Risk Interventions No flowsheet data found.

## 2020-09-01 NOTE — Progress Notes (Signed)
[place] Progress Note Patient Name: Sheryl [name] DOB: Sep 16, 1945 MRN: [id number]   Date of Service  09/01/2020  HPI/Events of Note  Patient is asleep. She has a history of sleep apnea and wears CPAP at night at home. She has had significant desaturations tonight.  eICU Interventions  CPAP ordered.        Kerry Kass Nell Gales 09/01/2020, 3:16 AM

## 2020-09-01 NOTE — Progress Notes (Signed)
Transported to room [phone] on monitor with nursing staff. Continued to rest during transport. Niece aware of transfer, belongings sent to room with patient.

## 2020-09-01 NOTE — Progress Notes (Signed)
1500- Patient agitated, attempting to pull off bracelets, pull out IV and get out of bed.  Pt unable to be redirected, yelling and agitated.  Dr. Verlon Au aware and at bedside.  Orders received for Ativan IV.  1540- Pt resting and calm after IV ativan.

## 2020-09-01 NOTE — Progress Notes (Addendum)
Daily Progress Note   Patient Name: Sheryl [name]       Date: 09/01/2020 DOB: 01/04/1946  Age: 75 y.o. MRN#: [id number] Attending Physician: Nita Sells, MD Primary Care Physician: Marletta Lor, MD (Inactive) Admit Date: 08/27/2020  Reason for Consultation/Follow-up:    To discuss complex medical decision making related to patient's goals of care  Subjective: Patient appears comfortable with eyes closed.  After several attempts at waking her patient will give me delayed short answers to a few questions  - Do you need anything?  She responds, "not now".   A few bites of her breakfast have been eating and she will sip from a straw when I hold it to her lips and tell her to drink.   This is an improvement when compared to 3/6.  Daughter states patient has recurrent UTIs and is always very symptomatic with them.   Assessment: Patient admitted with Vtach/Torsades de Pointe now resolved.  Patient lethargic.  Waiting for her to return to baseline - which is more awake and conversant than she is presently.  She seems to be improving and not having further Vtach/Torsades.  Hopefully to will continue to improve.   Patient Profile/HPI:  75 y.o.female "[name]"with past medical history of dementia, neurosarcoidosis, schizoaffective disorder, CLL, OSA, DJD, fibromyalgia, and right eye vision losswho was admitted on 3/3/2022after a behavorial disturbance.  In the ambulance she went into [place] several times.  Later she was reported to have developed Torsades de pointe.  She was treated with magnesium and amiodarone.  PMT was consulted for [place] as she is a DNR.    Length of Stay: 3   Vital Signs: BP (!) 154/66   Pulse 78   Temp 97.8 F (36.6 C) (Oral)   Resp (!) 25   SpO2 100%   SpO2: SpO2: 100 % O2 Device: O2 Device: Nasal Cannula O2 Flow Rate: O2 Flow Rate (L/min): 3 L/min       Palliative Assessment/Data: 20%     Palliative Care Plan    Recommendations/Plan:  Continue current care.  Would be quick to restart normal amounts of benzodiazepines and SSRI as patient suffers with severe mental anguish and agitation when not on medications per niece.  Would not restart trazodone (QT prolonging).   I have not found in her records that she  has been on Zyprexa.  GOC are for comfort measures over aggressive care.  Per niece psych meds are critical to her comfort.  This may be a difficult balancing act.  If it turns out that there is no ability to balance wakefulness with psych meds niece would want comfort.  Hopeful for continued improvement.  Strongly recommend Palliative to follow at SNF.  Code Status:  DNR  Prognosis:   Unable to determine   Discharge Planning:  Anticipate she will return to her facility with Palliative to follow.  Care plan was discussed with [name].  Thank you for allowing the Palliative Medicine Team to assist in the care of this patient.  Total time spent:  25 min.     Greater than 50%  of this time was spent counseling and coordinating care related to the above assessment and plan.  Florentina Jenny, PA-C Palliative Medicine  Please contact Palliative MedicineTeam phone at [phone] for questions and concerns between 7 am - 7 pm.   Please see AMION for individual provider pager numbers.

## 2020-09-02 LAB — GLUCOSE, CAPILLARY
Glucose-Capillary: 111 mg/dL — ABNORMAL HIGH (ref 70–99)
Glucose-Capillary: 77 mg/dL (ref 70–99)
Glucose-Capillary: 87 mg/dL (ref 70–99)
Glucose-Capillary: 87 mg/dL (ref 70–99)

## 2020-09-02 LAB — COMPREHENSIVE METABOLIC PANEL
ALT: <5 U/L (ref 0–44)
AST: 16 U/L (ref 15–41)
Albumin: 2.6 g/dL — ABNORMAL LOW (ref 3.5–5.0)
Alkaline Phosphatase: 98 U/L (ref 38–126)
Anion gap: 11 (ref 5–15)
BUN: 12 mg/dL (ref 8–23)
CO2: 27 mmol/L (ref 22–32)
Calcium: 8.8 mg/dL — ABNORMAL LOW (ref 8.9–10.3)
Chloride: 102 mmol/L (ref 98–111)
Creatinine, Ser: 0.7 mg/dL (ref 0.44–1.00)
GFR, Estimated: >60 mL/min (ref >60)
Glucose, Bld: 98 mg/dL (ref 70–99)
Potassium: 3.4 mmol/L — ABNORMAL LOW (ref 3.5–5.1)
Sodium: 140 mmol/L (ref 135–145)
Total Bilirubin: 0.9 mg/dL (ref 0.3–1.2)
Total Protein: 5.2 g/dL — ABNORMAL LOW (ref 6.5–8.1)

## 2020-09-02 LAB — MAGNESIUM: Magnesium: 1.6 mg/dL — ABNORMAL LOW (ref 1.7–2.4)

## 2020-09-02 MED ORDER — OLANZAPINE 5 MG PO TABS: 5.0000 mg | ORAL_TABLET | Freq: Every day | ORAL | 0 refills | Status: DC

## 2020-09-02 MED ORDER — MAGNESIUM OXIDE 400 (241.3 MG) MG PO TABS: 800.0000 mg | ORAL_TABLET | Freq: Two times a day (BID) | ORAL | Status: DC

## 2020-09-02 MED ORDER — ALPRAZOLAM 0.5 MG PO TABS: 0.5000 mg | ORAL_TABLET | Freq: Three times a day (TID) | ORAL | 0 refills | Status: DC

## 2020-09-02 NOTE — Care Management Important Message (Signed)
Important Message  Patient Details  Name: Sheryl [name] MRN: [id number] Date of Birth: 10-15-1945   Medicare Important Message Given:  Yes - Important Message mailed due to current National Emergency  Verbal consent obtained due to current National Emergency  Relationship to patient: Self Contact Name: Deardra Call Date: 09/02/20  Time: 24 Phone: [phone] Outcome: No Answer/Busy Important Message mailed to: Patient address on file     Delorse Lek 09/02/2020, 1:25 PM

## 2020-09-02 NOTE — TOC Transition Note (Signed)
Transition of Care [facility]) - CM/SW Discharge Note   Patient Details  Name: Irania Durell MRN: [id number] Date of Birth: May 28, 1946  Transition of Care [facility]) CM/SW Contact:  Benard Halsted, LCSW Phone Number: 09/02/2020, 3:14 PM   Clinical Narrative:    Patient will DC to: [facility] Anticipated DC date: 09/02/20 Family notified: Niece, [name] Transport by: Corey Harold  Per MD patient ready for DC to [place]. RN to call report prior to discharge [phone]). RN, patient, patient's family, and facility notified of DC. Discharge Summary and FL2 sent to facility. Oxygen delivered to facility. DC packet on chart. Ambulance transport requested for patient.   CSW will sign off for now as social work intervention is no longer needed. Please consult [place] again if new needs arise.      Final next level of care: Memory Care Barriers to Discharge: Barriers Resolved   Patient Goals and CMS Choice   CMS Medicare.gov Compare Post Acute Care list provided to:: Patient Represenative (must comment) (Niece [name]) Choice offered to / list presented to : NA (NIece)  Discharge Placement                Patient to be transferred to facility by: [place] Name of family member notified: Niece Patient and family notified of of transfer: 09/02/20  Discharge Plan and Services In-house Referral: Clinical Social Work                                   Social Determinants of Health (SDOH) Interventions     Readmission Risk Interventions No flowsheet data found.

## 2020-09-02 NOTE — NC FL2 (Signed)
[place] LEVEL OF CARE SCREENING TOOL     IDENTIFICATION  Patient Name: Sheryl [name] Birthdate: Mar 10, 1946 Sex: female Admission Date (Current Location): 08/27/2020  [facility] and [place] Number:  Herbalist and Address:  [facility] [place] [address] [place], [place] [address]      Provider Number: [id number]  Attending Physician Name and Address:  Nita Sells, MD  Relative Name and Phone Number:  Maretta Bees [phone]    Current Level of Care: Hospital Recommended Level of Care: Memory Care Prior Approval Number:    Date Approved/Denied:   PASRR Number:    Discharge Plan: Other (Comment) ([facility] memory care)    Current Diagnoses: Patient Active Problem List   Diagnosis Date Noted  . VT (ventricular tachycardia) ([place]) 08/29/2020  . Torsades de pointes ([place]) 08/28/2020  . Hypomagnesemia 08/28/2020  . Hypokalemia 08/28/2020  . Hypophosphatemia 08/28/2020  . Dementia due to Parkinson's disease without behavioral disturbance ([place])   . Palliative care encounter   . Sinusitis nasal 06/24/2016  . History of IBS 10/31/2014  . 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 Back pain 03/26/2014  . Preventive measure 03/26/2014  . Spinal stenosis 04/17/2012  . 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 Chronic lymphocytic leukemia ([place]) 12/14/2006  . Dyslipidemia 12/14/2006  . EXOGENOUS OBESITY 12/14/2006  . Essential hypertension 12/14/2006  . Osteoarthritis 12/14/2006  . FIBROMYALGIA 12/14/2006    Orientation RESPIRATION BLADDER Height & Weight     Self  O2 (Nasal Cannula 3 liters) Incontinent,External catheter (External Urinary Catheter) Weight: 160 lb 4.4 oz (72.7 kg) Height:     BEHAVIORAL SYMPTOMS/MOOD NEUROLOGICAL BOWEL NUTRITION STATUS      Incontinent Diet (Regular-no added salt)  AMBULATORY STATUS COMMUNICATION OF NEEDS  Skin   Limited Assist Verbally Other (Comment) (ecchymosis,arm,hand,bil.,buttocks,midfoam,non tenting,wound inc.sacrum midsmall fissure-like tear,center gluteal cleft,foamliftdressing to assesssiteeveryshift clean,dry,intact,twiceaday,wound inc.knee anterior,L,scabbed oversmall adhesion,wound inc.LDAs)                       Personal Care Assistance Level of Assistance  Bathing,Feeding,Dressing Bathing Assistance: Limited assistance Feeding assistance: Maximum assistance (total assist) Dressing Assistance: Limited assistance     Functional Limitations Info  Sight,Hearing,Speech Sight Info: Impaired Hearing Info: Adequate Speech Info: Adequate    SPECIAL CARE FACTORS FREQUENCY  PT (By licensed PT),OT (By licensed OT)     PT Frequency: 3x min weekly OT Frequency: 3x min weekly            Contractures Contractures Info: Not present    Additional Factors Info  Code Status,Allergies,Psychotropic Code Status Info: DNR Allergies Info: Adhesive (tape),Latex, Bactrim (sulfamethoxazole-trimethoprim),Cyclophosphamide,Sulfa Antibiotics,Sulfa Antibiotics Psychotropic Info: OLANZapine (ZYPREXA) tablet 5 mg daily at bedtime         Current Medications (09/02/2020):    Discharge Medications: STOP taking these medications   EPINEPHrine 0.3 mg/0.3 mL Soaj injection Commonly known as: EPI-PEN   FLUoxetine 40 MG capsule Commonly known as: PROZAC   loratadine 10 MG tablet Commonly known as: CLARITIN   traZODone 50 MG tablet Commonly known as: DESYREL   Vitamin D 50 MCG (2000 UT) tablet     TAKE these medications   ALPRAZolam 0.5 MG tablet Commonly known as: XANAX Take 0.5 mg by mouth 3 (three) times daily.   atorvastatin 40 MG tablet Commonly known as: LIPITOR Take 1 tablet (40 mg total) by mouth daily. What changed: when to take this  carbidopa-levodopa 25-100 MG tablet Commonly known as: SINEMET IR Take 1 tablet by mouth 3 (three) times daily.    fluticasone 50 MCG/ACT nasal spray Commonly known as: FLONASE Place 1 spray into both nostrils daily.   OLANZapine 5 MG tablet Commonly known as: ZYPREXA Take 1 tablet (5 mg total) by mouth at bedtime.   vitamin B-12 1000 MCG tablet Commonly known as: CYANOCOBALAMIN Take 1 tablet (1,000 mcg total) by mouth daily.     Relevant Imaging Results:  Relevant Lab Results:   Additional Information [phone]  Benard Halsted, LCSW

## 2020-09-02 NOTE — Discharge Summary (Addendum)
Physician Discharge Summary  Sheryl [name] HYW:[id number] DOB: Feb 13, 1946 DOA: 08/27/2020  PCP: Marletta Lor, MD (Inactive)  Admit date: 08/27/2020 Discharge date: 09/02/2020  Time spent: 26 minutes  Recommendations for Outpatient Follow-up:  1. Patient discharging to ALF with palliative care following 2. Hard prescriptions of Zydis as well as Xanax given in addition to signed a portable DNR 3. 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 status post bradycardic and VT arrest on admission Neurosarcoid + schizophrenia    Please see below for itemized issues addressed in [place]- refer to other progress notes for clarity if needed  Discharge Condition: Overall guarded  Diet recommendation: Comfort feeds likely comfort trajectory to be clarified by skilled/ALF physician  Filed Weights   09/01/20 2333  Weight: 72.7 kg    History of present illness:  75 year old white female from [facility] Neurosarcoid, schizoaffective, Parkinson's plus, fibromyalgia Right optic neuropathy + visual loss Allegedly recent increase in antipsychotics at nursing facility Admit 3/3 toxic metabolic encephalopathy, bradycardic = 30-->V. tach-->amiodarone and EMS Sustained polymorphic V. tach + intermittent torsades-->amiodarone gtt. Metabolic work-up = potassium 3.2 magnesium 1.3 WBC 12.5 lactic acid 2 CXR = negative  Critical care consulted-started isoproterenol gtt. Respiratory viral panel by them = negative-they discontinued Zyprexa and aggressively repleted magnesium  Discussions were entertained with patient's niece who is [facility] POA proxy-palliative care saw the patient as well and they are understanding of likely comfort related trajectory in the outpatient setting  Hospital Course:  Bradycardic + VT  arrest Hypomagnesemia              goal is likely comfort trajectory, Stop checking labs unless  absolutely needed in OP setting Stop hydralazine Cardiology signed off because of reduction in PVCs and resolution of problems Somnolence secondary to polypharmacy not metabolic encephalopathy Fibromyalgia Discontinued Xanax,Seroquel, trazodone, Prozac as she is extremely sleepy--started Zydis (not Seroquel) 5 mg at bedtime Trialed Narcan but no benefit Her baseline is she sleeps for 15-18 hrs a day She has a poor underlying prognosis as she needs to be somewhat sedated because of her somnolence she is at risk for aspiration This was discussed by palliative care with patient's niece Parkinson's plus, neurosarcoid, optic neuropathy Has not received carbidopa for over 48 hours Hopeful that patient will recover and get back on diurnal cycle I would not give carbidopa going forward as the patient's niece tells me she has never heard that the patient had this diagnosis-it is unlikely that it will make a large difference in her mental state at this juncture given the need for antipsychotics This can be considered by discussing with skilled facility/ALF physician who knows her better if she is willing to take meds and is amenable to the same Chronic lymphocytic leukemia Clinically not significant Sleep apnea not on CPAP goals are comfort Prior steroid myopathy  Consultations:  Palliative care  Assumed care from critical care on 3/7  Discharge Exam: Vitals:   09/01/20 2013 09/01/20 2333  BP: 126/65 (!) 142/97  Pulse: 75 60  Resp: 14 14  Temp: 98 F (36.7 C) 98.6 F (37 C)  SpO2: 95% 100%    Subj on day of d/c   O2 sats in the 70s it appears she has pulled off her nasal cannula She is hardly arousable she does seem to have some hypnagogic twitches but then seems to  awaken a little bit I am not able to get a history out of her  General Exam on discharge  Confused Pupils are  normal CTA B no added sound S1-S2 sinus No lower extremity edema Rest of neurological exam deferred  Discharge Instructions   Discharge Instructions    Diet - low sodium heart healthy   Complete by: As directed    Discharge instructions   Complete by: As directed    Patient to discharge to ALF with comfort related [facility] to follow in the outpatient setting   Increase activity slowly   Complete by: As directed    No wound care   Complete by: As directed      Allergies as of 09/02/2020      Reactions   Adhesive [tape] Rash   Redness from tape.    Latex Anaphylaxis, Rash, Other (See Comments)   Added based on information entered during case entry, please review and add reactions, type, and severity as needed. Patient cannot confirm this allergy.   Bactrim [sulfamethoxazole-trimethoprim]    Cyclophosphamide Other (See Comments)   Caused diverticulitis.  She would not eat or drink for days, while taking it.   Sulfa Antibiotics    PER MAR      Medication List    STOP taking these medications   EPINEPHrine 0.3 mg/0.3 mL Soaj injection Commonly known as: EPI-PEN   FLUoxetine 40 MG capsule Commonly known as: PROZAC   loratadine 10 MG tablet Commonly known as: CLARITIN   traZODone 50 MG tablet Commonly known as: DESYREL   Vitamin D 50 MCG (2000 UT) tablet     TAKE these medications   ALPRAZolam 0.5 MG tablet Commonly known as: XANAX Take 0.5 mg by mouth 3 (three) times daily.   atorvastatin 40 MG tablet Commonly known as: LIPITOR Take 1 tablet (40 mg total) by mouth daily. What changed: when to take this   carbidopa-levodopa 25-100 MG tablet Commonly known as: SINEMET IR Take 1 tablet by mouth 3 (three) times daily.   fluticasone 50 MCG/ACT nasal spray Commonly known as: FLONASE Place 1 spray into both nostrils daily.   OLANZapine 5 MG tablet Commonly known as: ZYPREXA Take 1 tablet (5 mg total) by mouth at bedtime.   vitamin B-12 1000  MCG tablet Commonly known as: CYANOCOBALAMIN Take 1 tablet (1,000 mcg total) by mouth daily.      Allergies  Allergen Reactions  . Adhesive [Tape] Rash    Redness from tape.   . Latex Anaphylaxis, Rash and Other (See Comments)    Added based on information entered during case entry, please review and add reactions, type, and severity as needed. Patient cannot confirm this allergy.  . Bactrim [Sulfamethoxazole-Trimethoprim]   . Cyclophosphamide Other (See Comments)    Caused diverticulitis.  She would not eat or drink for days, while taking it.  . Sulfa Antibiotics     PER MAR      The results of significant diagnostics from this hospitalization (including imaging, microbiology, ancillary and laboratory) are listed below for reference.    Significant Diagnostic Studies: DG Chest 2 View  Result Date: 08/24/2020 CLINICAL DATA:  Recent fall with chest pain, initial encounter EXAM: CHEST - 2 VIEW COMPARISON:  06/06/2019 FINDINGS: Cardiac shadow is within normal limits. Lungs are clear bilaterally. No acute bony abnormality is seen. Prior surgical changes in the cervical and thoracolumbar spine are again seen. IMPRESSION: No acute abnormality noted. Electronically Signed   By: Inez Catalina  M.D.   On: 08/24/2020 17:46   DG Pelvis 1-2 Views  Result Date: 08/24/2020 CLINICAL DATA:  Recent fall with pelvic pain, initial encounter EXAM: PELVIS - 1-2 VIEW COMPARISON:  None. FINDINGS: Pelvic ring is intact. Mild degenerative changes of the hip joints are noted bilaterally. No acute fracture or dislocation is seen. Postsurgical changes in the lower lumbar spine are noted. IMPRESSION: No acute abnormality noted. Electronically Signed   By: Inez Catalina M.D.   On: 08/24/2020 17:53   CT Head Wo Contrast  Result Date: 08/24/2020 CLINICAL DATA:  Pain in the head and neck after falling while trying to get out of her wheelchair. No loss of consciousness. EXAM: CT HEAD WITHOUT CONTRAST CT CERVICAL SPINE  WITHOUT CONTRAST TECHNIQUE: Multidetector CT imaging of the head and cervical spine was performed following the standard protocol without intravenous contrast. Multiplanar CT image reconstructions of the cervical spine were also generated. COMPARISON:  06/06/2020 FINDINGS: CT HEAD FINDINGS Brain: There is moderate central cortical atrophy. There is no intra or extra-axial fluid collection or mass lesion. The basilar cisterns and ventricles have a normal appearance. There is no CT evidence for acute infarction or hemorrhage. 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 There is no intra or extra-axial fluid collection or mass lesion. The basilar cisterns and ventricles have a normal appearance. There is no CT evidence for acute infarction or hemorrhage. Vascular: No hyperdense vessel or unexpected calcification. Skull: Normal. Negative for fracture or focal lesion. Sinuses/Orbits: Soft tissue density within the LEFT maxillary sinus is consistent with followup, mucoperiosteal thickening, or mucocele and is stable. Other: Study quality is degraded by patient motion artifact. CT CERVICAL SPINE FINDINGS Alignment: Remote anterior fusion of C3-C7.  Normal alignment. Skull base and vertebrae: No acute fracture. No primary bone lesion or focal pathologic process. Soft tissues and spinal canal: No prevertebral fluid or swelling. No visible canal hematoma. Disc levels:  Large osteophyte at C2-3 is stable. Upper chest: Negative. Other: None IMPRESSION: 1. No evidence for acute intracranial abnormality. 2. Atrophy. 3. 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 4. Remote anterior fusion of C3-C7. 5. No evidence for acute cervical spine abnormality. 6. Stable large osteophyte at C2-3. Electronically Signed   By: Nolon Nations M.D.   On: 08/24/2020 17:44   CT Cervical Spine Wo Contrast  Result Date: 08/24/2020 CLINICAL DATA:  Pain in the head and neck after falling while trying to get out of  her wheelchair. No loss of consciousness. EXAM: CT HEAD WITHOUT CONTRAST CT CERVICAL SPINE WITHOUT CONTRAST TECHNIQUE: Multidetector CT imaging of the head and cervical spine was performed following the standard protocol without intravenous contrast. Multiplanar CT image reconstructions of the cervical spine were also generated. COMPARISON:  06/06/2020 FINDINGS: CT HEAD FINDINGS Brain: There is moderate central cortical atrophy. There is no intra or extra-axial fluid collection or mass lesion. The basilar cisterns and ventricles have a normal appearance. There is no CT evidence for acute infarction or hemorrhage. 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 There is no intra or extra-axial fluid collection or mass lesion. The basilar cisterns and ventricles have a normal appearance. There is no CT evidence for acute infarction or hemorrhage. Vascular: No hyperdense vessel or unexpected calcification. Skull: Normal. Negative for fracture or focal lesion. Sinuses/Orbits: Soft tissue density within the LEFT maxillary sinus is consistent with followup, mucoperiosteal thickening, or mucocele and is stable. Other: Study quality is degraded by patient motion artifact. CT CERVICAL SPINE  FINDINGS Alignment: Remote anterior fusion of C3-C7.  Normal alignment. Skull base and vertebrae: No acute fracture. No primary bone lesion or focal pathologic process. Soft tissues and spinal canal: No prevertebral fluid or swelling. No visible canal hematoma. Disc levels:  Large osteophyte at C2-3 is stable. Upper chest: Negative. Other: None IMPRESSION: 1. No evidence for acute intracranial abnormality. 2. Atrophy. 3. 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 4. Remote anterior fusion of C3-C7. 5. No evidence for acute cervical spine abnormality. 6. Stable large osteophyte at C2-3. Electronically Signed   By: Nolon Nations M.D.   On: 08/24/2020 17:44   DG Chest Port 1 View  Result Date:  08/27/2020 CLINICAL DATA:  Dysrhythmia EXAM: PORTABLE CHEST 1 VIEW COMPARISON:  None. FINDINGS: The heart size and mediastinal contours are within normal limits. Aortic knob calcifications are seen. Both lungs are clear. The visualized skeletal structures are unremarkable. Spinal fixation hardware is noted. IMPRESSION: No active disease. Electronically Signed   By: Prudencio Pair M.D.   On: 08/27/2020 21:39   ECHOCARDIOGRAM COMPLETE  Result Date: 08/28/2020    ECHOCARDIOGRAM REPORT   Patient Name:   Sheryl [name] Date of Exam: 08/28/2020 Medical Rec #:  [id number]          Height:       62.0 in Accession #:    [id number]         Weight:       160.0 lb Date of Birth:  1945/09/18           BSA:          1.739 m Patient Age:    53 years           BP:           191/78 mmHg Patient Gender: F                  HR:           50 bpm. Exam Location:  Inpatient Procedure: 2D Echo, 3D Echo, Cardiac Doppler, Color Doppler and Strain Analysis Indications:    VT  History:        Patient has no prior history of Echocardiogram examinations.                 Risk Factors:Hypertension and Dyslipidemia.  Sonographer:    Luisa Hart RDCS Referring Phys: [id number] Why [place] IMPRESSIONS  1. Left ventricular ejection fraction, by estimation, is 60 to 65%. The left ventricle has normal function. The left ventricle has no regional wall motion abnormalities. Left ventricular diastolic parameters are consistent with Grade I diastolic dysfunction (impaired relaxation). Elevated left ventricular end-diastolic pressure. The E/e' is 9.  2. Right ventricular systolic function is hyperdynamic. The right ventricular size is normal.  3. The mitral valve is abnormal. Trivial mitral valve regurgitation. Moderate to severe mitral annular calcification.  4. The aortic valve is tricuspid. Aortic valve regurgitation is not visualized. Mild to moderate aortic valve sclerosis/calcification is present, without any evidence of aortic stenosis. Aortic valve mean  gradient measures 4.0 mmHg. Aortic valve Vmax measures 1.39 m/s.  5. The inferior vena cava is normal in size with <50% respiratory variability, suggesting right atrial pressure of 8 mmHg. FINDINGS  Left Ventricle: Left ventricular ejection fraction, by estimation, is 60 to 65%. The left ventricle has normal function. The left ventricle has no regional wall motion abnormalities. The left ventricular internal cavity size was normal in size. There is  no left ventricular  hypertrophy. Left ventricular diastolic parameters are consistent with Grade I diastolic dysfunction (impaired relaxation). Elevated left ventricular end-diastolic pressure. The E/e' is 31. Right Ventricle: The right ventricular size is normal. No increase in right ventricular wall thickness. Right ventricular systolic function is hyperdynamic. Left Atrium: Left atrial size was normal in size. Right Atrium: Right atrial size was normal in size. Pericardium: There is no evidence of pericardial effusion. Mitral Valve: The mitral valve is abnormal. Moderate to severe mitral annular calcification. Trivial mitral valve regurgitation. MV peak gradient, 8.9 mmHg. The mean mitral valve gradient is 2.0 mmHg. Tricuspid Valve: The tricuspid valve is grossly normal. Tricuspid valve regurgitation is trivial. Aortic Valve: The aortic valve is tricuspid. Aortic valve regurgitation is not visualized. Mild to moderate aortic valve sclerosis/calcification is present, without any evidence of aortic stenosis. Aortic valve mean gradient measures 4.0 mmHg. Aortic valve peak gradient measures 7.7 mmHg. Aortic valve area, by VTI measures 1.61 cm. Pulmonic Valve: The pulmonic valve was grossly normal. Pulmonic valve regurgitation is trivial. Aorta: The aortic root and ascending aorta are structurally normal, with no evidence of dilitation. Venous: The inferior vena cava is normal in size with less than 50% respiratory variability, suggesting right atrial pressure of 8 mmHg.  IAS/Shunts: No atrial level shunt detected by color flow Doppler.  LEFT VENTRICLE PLAX 2D LVIDd:         4.60 cm  Diastology LVIDs:         2.30 cm  LV e' medial:    4.68 cm/s LV PW:         0.90 cm  LV E/e' medial:  24.4 LV IVS:        1.10 cm  LV e' lateral:   5.77 cm/s LVOT diam:     1.60 cm  LV E/e' lateral: 19.8 LV SV:         59 LV SV Index:   34 LVOT Area:     2.01 cm  RIGHT VENTRICLE RV S prime:     13.90 cm/s TAPSE (M-mode): 4.0 cm LEFT ATRIUM           Index       RIGHT ATRIUM          Index LA Vol (A4C): 32.4 ml 18.63 ml/m RA Area:     8.88 cm                                   RA Volume:   18.40 ml 10.58 ml/m  AORTIC VALVE                   PULMONIC VALVE AV Area (Vmax):    1.62 cm    PV Vmax:       0.98 m/s AV Area (Vmean):   1.62 cm    PV Vmean:      63.400 cm/s AV Area (VTI):     1.61 cm    PV VTI:        0.229 m AV Vmax:           139.00 cm/s PV Peak grad:  3.8 mmHg AV Vmean:          90.200 cm/s PV Mean grad:  2.0 mmHg AV VTI:            0.367 m AV Peak Grad:      7.7 mmHg AV Mean Grad:      4.0 mmHg  LVOT Vmax:         112.00 cm/s LVOT Vmean:        72.600 cm/s LVOT VTI:          0.293 m LVOT/AV VTI ratio: 0.80  AORTA Ao Root diam: 2.70 cm Ao Asc diam:  3.00 cm MITRAL VALVE MV Area (PHT): 3.03 cm     SHUNTS MV Area VTI:   1.26 cm     Systemic VTI:  0.29 m MV Peak grad:  8.9 mmHg     Systemic Diam: 1.60 cm MV Mean grad:  2.0 mmHg MV Vmax:       1.49 m/s MV Vmean:      65.8 cm/s MV Decel Time: 250 msec MR Peak grad: 136.0 mmHg MR Mean grad: 94.0 mmHg MR Vmax:      583.00 cm/s MR Vmean:     475.0 cm/s MV E velocity: 114.00 cm/s MV A velocity: 150.00 cm/s MV E/A ratio:  0.76 Lyman Bishop MD Electronically signed by Lyman Bishop MD Signature Date/Time: 08/28/2020/3:37:42 PM    Final     Microbiology: Recent Results (from the past 240 hour(s))  Resp Panel by RT-PCR (Flu A&B, Covid) Nasopharyngeal Swab     Status: None   Collection Time: 08/28/20 10:23 AM   Specimen: Nasopharyngeal Swab;  Nasopharyngeal(NP) swabs in vial transport medium  Result Value Ref Range Status   SARS Coronavirus 2 by RT PCR NEGATIVE NEGATIVE Final    Comment: (NOTE) SARS-CoV-2 target nucleic acids are NOT DETECTED.  The SARS-CoV-2 RNA is generally detectable in upper respiratory specimens during the acute phase of infection. The lowest concentration of SARS-CoV-2 viral copies this assay can detect is 138 copies/mL. A negative result does not preclude SARS-Cov-2 infection and should not be used as the sole basis for treatment or other patient management decisions. A negative result may occur with  improper specimen collection/handling, submission of specimen other than nasopharyngeal swab, presence of viral mutation(s) within the areas targeted by this assay, and inadequate number of viral copies(<138 copies/mL). A negative result must be combined with clinical observations, patient history, and epidemiological information. The expected result is Negative.  Fact Sheet for Patients:  EntrepreneurPulse.com.au  Fact Sheet for Healthcare Providers:  IncredibleEmployment.be  This test is no t yet approved or cleared by the [place] FDA and  has been authorized for detection and/or diagnosis of SARS-CoV-2 by FDA under an Emergency Use Authorization (EUA). This EUA will remain  in effect (meaning this test can be used) for the duration of the COVID-19 declaration under Section 564(b)(1) of the Act, 21 U.S.C.section 360bbb-3(b)(1), unless the authorization is terminated  or revoked sooner.       Influenza A by PCR NEGATIVE NEGATIVE Final   Influenza B by PCR NEGATIVE NEGATIVE Final    Comment: (NOTE) The Xpert Xpress SARS-CoV-2/FLU/RSV plus assay is intended as an aid in the diagnosis of influenza from Nasopharyngeal swab specimens and should not be used as a sole basis for treatment. Nasal washings and aspirates are unacceptable for Xpert Xpress  SARS-CoV-2/FLU/RSV testing.  Fact Sheet for Patients: EntrepreneurPulse.com.au  Fact Sheet for Healthcare Providers: IncredibleEmployment.be  This test is not yet approved or cleared by the [place] FDA and has been authorized for detection and/or diagnosis of SARS-CoV-2 by FDA under an Emergency Use Authorization (EUA). This EUA will remain in effect (meaning this test can be used) for the duration of the COVID-19 declaration under Section 564(b)(1) of the Act, 21 U.S.C. section 360bbb-3(b)(1),  unless the authorization is terminated or revoked.  Performed at [facility], [place] [address] [place], [place] [address]   MRSA PCR Screening     Status: None   Collection Time: 08/28/20  2:26 PM   Specimen: Nasal Mucosa; Nasopharyngeal  Result Value Ref Range Status   MRSA by PCR NEGATIVE NEGATIVE Final    Comment:        The GeneXpert MRSA Assay (FDA approved for NASAL specimens only), is one component of a comprehensive MRSA colonization surveillance program. It is not intended to diagnose MRSA infection nor to guide or monitor treatment for MRSA infections. Performed at [facility], [place] [address], [place], [place] [address]   SARS CORONAVIRUS 2 (TAT 6-24 HRS) Nasopharyngeal Nasopharyngeal Swab     Status: None   Collection Time: 09/01/20  6:07 PM   Specimen: Nasopharyngeal Swab  Result Value Ref Range Status   SARS Coronavirus 2 NEGATIVE NEGATIVE Final    Comment: (NOTE) SARS-CoV-2 target nucleic acids are NOT DETECTED.  The SARS-CoV-2 RNA is generally detectable in upper and lower respiratory specimens during the acute phase of infection. Negative results do not preclude SARS-CoV-2 infection, do not rule out co-infections with other pathogens, and should not be used as the sole basis for treatment or other patient management decisions. Negative results must be combined with clinical observations, patient history,  and epidemiological information. The expected result is Negative.  Fact Sheet for Patients: SugarRoll.be  Fact Sheet for Healthcare Providers: https://www.woods-mathews.com/  This test is not yet approved or cleared by the [place] FDA and  has been authorized for detection and/or diagnosis of SARS-CoV-2 by FDA under an Emergency Use Authorization (EUA). This EUA will remain  in effect (meaning this test can be used) for the duration of the COVID-19 declaration under Se ction 564(b)(1) of the Act, 21 U.S.C. section 360bbb-3(b)(1), unless the authorization is terminated or revoked sooner.  Performed at [facility], [place] [address] [place], [place] [address]      Labs: Basic Metabolic Panel: Recent Labs  Lab 08/27/20 2114 08/27/20 2142 08/28/20 1551 08/29/20 0123 08/31/20 0031 09/01/20 0056 09/02/20 0156  NA 139   < > 143 139 141 141 140  K 3.2*   < > 4.2 3.7 4.0 3.9 3.4*  CL 104   < > 108 107 107 104 102  CO2 23  --  24 20* 25 27 27   GLUCOSE 90   < > 96 128* 96 86 98  BUN 13   < > 10 9 7* 11 12  CREATININE 1.05*   < > 1.01* 0.96 0.74 0.74 0.70  CALCIUM 9.0  --  8.7* 8.3* 8.7* 8.9 8.8*  MG 1.3*  --  2.1 2.3 1.8  --  1.6*  PHOS 2.2*  --   --  4.6  --   --   --    < > = values in this interval not displayed.   Liver Function Tests: Recent Labs  Lab 08/27/20 2114 09/01/20 0056 09/02/20 0156  AST 16 20 16   ALT 14 <5 <5  ALKPHOS 119 101 98  BILITOT 0.8 0.8 0.9  PROT 6.1* 5.4* 5.2*  ALBUMIN 3.5 2.6* 2.6*   No results for input(s): LIPASE, AMYLASE in the last 168 hours. No results for input(s): AMMONIA in the last 168 hours. CBC: Recent Labs  Lab 08/27/20 2114 08/27/20 2142 08/29/20 0123 08/31/20 0811  WBC 12.5*  --  8.2 9.8  NEUTROABS  8.9*  --   --  6.9  HGB 13.6 14.3 12.0 11.8*  HCT 40.5 42.0 35.9* 36.7  MCV 84.2  --  85.5 87.8  PLT 198  --  112* 137*   Cardiac Enzymes: No results for input(s):  CKTOTAL, CKMB, CKMBINDEX, TROPONINI in the last 168 hours. BNP: BNP (last 3 results) Recent Labs    08/27/20 2114  BNP 120.0*    ProBNP (last 3 results) No results for input(s): PROBNP in the last 8760 hours.  CBG: Recent Labs  Lab 09/01/20 0712 09/01/20 1137 09/01/20 1601 09/01/20 2226 09/02/20 0008  GLUCAP 83 92 93 101* 111*       Signed:  Nita Sells MD   [facility] 09/02/2020, 9:39 AM

## 2020-09-02 NOTE — TOC Progression Note (Signed)
Transition of Care [facility]) - Progression Note    Patient Details  Name: Sheryl [name] MRN: [id number] Date of Birth: [birth year]  Transition of Care [facility]) CM/SW [place], LCSW Phone Number: 09/02/2020, 11:58 AM  Clinical Narrative:    CSW spoke with Lovena Le at [facility] memory care. She reported they do not use a specific oxygen agency; CSW sent referral to [place]. CSW requested [facility] contact CSW once oxygen has arrived so that PTAR can be arranged. CSW faxed FL2 and DC summary to [place] (fax [phone]).     Expected Discharge Plan: Assisted Living ([facility]) Barriers to Discharge: Continued Medical Work up  Expected Discharge Plan and Services Expected Discharge Plan: Assisted Living ([facility]) In-house Referral: Clinical Social Work     Living arrangements for the past 2 months: [place] ([place]) Expected Discharge Date: 09/02/20                                     Social Determinants of Health (SDOH) Interventions    Readmission Risk Interventions No flowsheet data found.

## 2020-09-02 NOTE — Progress Notes (Signed)
SATURATION QUALIFICATIONS: (This note is used to comply with regulatory documentation for home oxygen)  Patient Saturations on Room Air at Rest = 87%  Patient Saturations on 3 Liters of oxygen while Ambulating = 99%  Please briefly explain why patient needs home oxygen: Patient's oxygen saturation dropped to 87% on RA at rest.

## 2020-09-02 NOTE — Progress Notes (Signed)
Report called to Lovena Le, Therapist, sports at [place].

## 2020-09-03 NOTE — Progress Notes (Signed)
PTAR arrived to transport patient to [place]. Patient safely discharged.

## 2020-09-04 LAB — URINE CULTURE: Culture: >=100000 — AB

## 2020-09-25 DEATH — deceased
